# Patient Record
Sex: Male | Born: 1945 | Race: White | Hispanic: No | Smoking: Never smoker
Health system: Southern US, Community
[De-identification: ages and names within clinical notes are randomized; demographics above are authoritative.]

## PROBLEM LIST (undated history)

## (undated) DIAGNOSIS — G459 Transient cerebral ischemic attack, unspecified: Secondary | ICD-10-CM

## (undated) DIAGNOSIS — I639 Cerebral infarction, unspecified: Secondary | ICD-10-CM

## (undated) DIAGNOSIS — Z9289 Personal history of other medical treatment: Secondary | ICD-10-CM

## (undated) DIAGNOSIS — E1142 Type 2 diabetes mellitus with diabetic polyneuropathy: Secondary | ICD-10-CM

## (undated) DIAGNOSIS — C499 Malignant neoplasm of connective and soft tissue, unspecified: Secondary | ICD-10-CM

## (undated) DIAGNOSIS — E119 Type 2 diabetes mellitus without complications: Secondary | ICD-10-CM

## (undated) DIAGNOSIS — F1010 Alcohol abuse, uncomplicated: Secondary | ICD-10-CM

## (undated) DIAGNOSIS — F0390 Unspecified dementia without behavioral disturbance: Secondary | ICD-10-CM

## (undated) DIAGNOSIS — I10 Essential (primary) hypertension: Secondary | ICD-10-CM

## (undated) DIAGNOSIS — E785 Hyperlipidemia, unspecified: Secondary | ICD-10-CM

## (undated) HISTORY — DX: Hyperlipidemia, unspecified: E78.5

## (undated) HISTORY — DX: Essential (primary) hypertension: I10

## (undated) HISTORY — PX: OTHER SURGICAL HISTORY: SHX169

## (undated) HISTORY — DX: Cerebral infarction, unspecified: I63.9

---

## 1997-07-06 ENCOUNTER — Emergency Department (HOSPITAL_COMMUNITY): Admission: EM | Admit: 1997-07-06 | Discharge: 1997-07-06 | Payer: Self-pay | Admitting: Emergency Medicine

## 2002-06-02 ENCOUNTER — Ambulatory Visit: Admission: EM | Admit: 2002-06-02 | Discharge: 2002-06-02 | Payer: Self-pay | Admitting: Emergency Medicine

## 2002-06-02 ENCOUNTER — Encounter: Payer: Self-pay | Admitting: Emergency Medicine

## 2002-06-02 ENCOUNTER — Encounter: Payer: Self-pay | Admitting: *Deleted

## 2002-06-05 ENCOUNTER — Emergency Department (HOSPITAL_COMMUNITY): Admission: EM | Admit: 2002-06-05 | Discharge: 2002-06-06 | Payer: Self-pay | Admitting: Emergency Medicine

## 2002-06-05 ENCOUNTER — Encounter: Payer: Self-pay | Admitting: *Deleted

## 2005-11-02 ENCOUNTER — Emergency Department (HOSPITAL_COMMUNITY): Admission: EM | Admit: 2005-11-02 | Discharge: 2005-11-02 | Payer: Self-pay | Admitting: Emergency Medicine

## 2005-11-05 ENCOUNTER — Encounter (HOSPITAL_BASED_OUTPATIENT_CLINIC_OR_DEPARTMENT_OTHER): Admission: RE | Admit: 2005-11-05 | Discharge: 2005-12-24 | Payer: Self-pay | Admitting: Surgery

## 2005-11-19 ENCOUNTER — Ambulatory Visit (HOSPITAL_COMMUNITY): Admission: RE | Admit: 2005-11-19 | Discharge: 2005-11-19 | Payer: Self-pay | Admitting: Surgery

## 2005-12-14 ENCOUNTER — Ambulatory Visit: Admission: RE | Admit: 2005-12-14 | Discharge: 2005-12-14 | Payer: Self-pay | Admitting: Surgery

## 2010-08-07 ENCOUNTER — Encounter: Payer: Self-pay | Admitting: Internal Medicine

## 2010-08-07 ENCOUNTER — Emergency Department (HOSPITAL_COMMUNITY): Payer: Medicare Other

## 2010-08-07 ENCOUNTER — Encounter (HOSPITAL_COMMUNITY): Payer: Self-pay

## 2010-08-07 ENCOUNTER — Inpatient Hospital Stay (HOSPITAL_COMMUNITY)
Admission: EM | Admit: 2010-08-07 | Discharge: 2010-08-08 | DRG: 073 | Disposition: A | Discharge status: Home / Self Care | Payer: Medicare Other | Attending: Internal Medicine | Admitting: Internal Medicine

## 2010-08-07 DIAGNOSIS — Z7982 Long term (current) use of aspirin: Secondary | ICD-10-CM

## 2010-08-07 DIAGNOSIS — M5412 Radiculopathy, cervical region: Secondary | ICD-10-CM | POA: Diagnosis present

## 2010-08-07 DIAGNOSIS — E1149 Type 2 diabetes mellitus with other diabetic neurological complication: Principal | ICD-10-CM | POA: Diagnosis present

## 2010-08-07 DIAGNOSIS — I6350 Cerebral infarction due to unspecified occlusion or stenosis of unspecified cerebral artery: Secondary | ICD-10-CM | POA: Diagnosis present

## 2010-08-07 DIAGNOSIS — E785 Hyperlipidemia, unspecified: Secondary | ICD-10-CM | POA: Diagnosis present

## 2010-08-07 DIAGNOSIS — I10 Essential (primary) hypertension: Secondary | ICD-10-CM | POA: Diagnosis present

## 2010-08-07 DIAGNOSIS — E1142 Type 2 diabetes mellitus with diabetic polyneuropathy: Secondary | ICD-10-CM | POA: Diagnosis present

## 2010-08-07 HISTORY — DX: Malignant neoplasm of connective and soft tissue, unspecified: C49.9

## 2010-08-07 LAB — CBC
MCV: 85.8 fL (ref 78.0–100.0)
Platelets: 256 10*3/uL (ref 150–400)
WBC: 5.2 10*3/uL (ref 4.0–10.5)

## 2010-08-07 LAB — APTT: aPTT: 25 seconds (ref 24–37)

## 2010-08-07 LAB — DIFFERENTIAL
Band Neutrophils: 0 % (ref 0–10)
Basophils Absolute: 0 10*3/uL (ref 0.0–0.1)
Basophils Relative: 0 % (ref 0–1)
Blasts: 0 %
Eosinophils Relative: 4 % (ref 0–5)
Lymphocytes Relative: 36 % (ref 12–46)
Lymphs Abs: 1.9 10*3/uL (ref 0.7–4.0)
Monocytes Absolute: 0.4 10*3/uL (ref 0.1–1.0)
Monocytes Relative: 8 % (ref 3–12)

## 2010-08-07 LAB — CARDIAC PANEL(CRET KIN+CKTOT+MB+TROPI): Total CK: 62 U/L (ref 7–232)

## 2010-08-07 LAB — URINALYSIS, ROUTINE W REFLEX MICROSCOPIC
Bilirubin Urine: NEGATIVE
Hgb urine dipstick: NEGATIVE
Protein, ur: NEGATIVE mg/dL
Urobilinogen, UA: 0.2 mg/dL (ref 0.0–1.0)

## 2010-08-07 LAB — COMPREHENSIVE METABOLIC PANEL
ALT: 10 U/L (ref 0–53)
AST: 10 U/L (ref 0–37)
Alkaline Phosphatase: 50 U/L (ref 39–117)
CO2: 31 mEq/L (ref 19–32)
Chloride: 103 mEq/L (ref 96–112)
Creatinine, Ser: 0.85 mg/dL (ref 0.50–1.35)
GFR calc non Af Amer: >60 mL/min (ref >60)
Total Bilirubin: 0.4 mg/dL (ref 0.3–1.2)

## 2010-08-07 LAB — GLUCOSE, CAPILLARY
Glucose-Capillary: 289 mg/dL — ABNORMAL HIGH (ref 70–99)
Glucose-Capillary: 188 mg/dL — ABNORMAL HIGH (ref 70–99)

## 2010-08-07 LAB — HEMOGLOBIN A1C
Hgb A1c MFr Bld: 9.6 % — ABNORMAL HIGH (ref <5.7)
Mean Plasma Glucose: 229 mg/dL — ABNORMAL HIGH (ref <117)

## 2010-08-07 LAB — TROPONIN I: Troponin I: <0.3 ng/mL (ref <0.30)

## 2010-08-07 LAB — BASIC METABOLIC PANEL
CO2: 31 mEq/L (ref 19–32)
Glucose, Bld: 302 mg/dL — ABNORMAL HIGH (ref 70–99)
Potassium: 5 mEq/L (ref 3.5–5.1)
Sodium: 139 mEq/L (ref 135–145)

## 2010-08-07 NOTE — H&P (Signed)
Hospital Admission Note Date: 08/07/2010  Patient name: Hayden Mckinney Medical record number: [id number] Date of birth: Feb 16, 1945 Age: 65 y.o. Gender: male PCP: No primary provider on file.  Medical Service: [facility] Teaching [facility]  Attending physician: Dr. Eppie Gibson   Pager:  Resident (R2/R3): Dr. Vanessa Kick   Pager: [phone] Resident (R1): Dr. Victorio Palm    Pager: [phone]  Chief Complaint: Progressing numbness on left face/arm/leg  History of Present Illness: [name] is a 65 yo male that presents with increasing numbness of his left cheek, mouth, left arm and hand including 3 medial fingers as well as left leg for 1 month.  He notes that the progression to his leg was over the last 1-2 weeks, during which time his awareness of the numbness increased.  He denies an inciting incident 1 month ago such as trauma or an excruciating headache.  He also reports left eye swelling with pressure for the last month.  At present, there is no edema, but he reports constant pressure in his left eye.  He denies loss of vision but endorses b/l blurry vision that only occurs in the morning.  He endorses intermittent (every 2-3d) left frontal headaches described as sharp/stabbing, 7/10 at worst,  that improve with ASA.  He notes that his gait has deteriorated in that he now staggers and feels unsteady with walking. He feels flat footed and notes a loss of spring in his step.  He reports that on the evening prior to admission, he almost fell out of the shower and felt dizzy on the morning of admission.  He endorses feeling disoriented when he takes a shower, which is at no particular time of day, and he describes the water temperature as warm, not cold.  Finally, he endorses some left sided drooling over the last month.  Of note, patient has not been on any medications for years.  He reports only taking ASA when needed for a headache.  Meds: No current outpatient prescriptions on file.    Allergies: Review of  patient's allergies indicates no known allergies.  Past Medical History  Diagnosis Date  . Diabetes mellitus, history of treatment with metformin    . Cancer sarcoma of rt leg, excised  . HTN, no medication    Past Surgical History: Excision of right leg sarcoma  Family History: Dad, died in 67s, lung pathology? Mom, died @ 20yo, Brain Cancer Brother : DM, Sister: TB  History   Social History  . Marital Status: Widowed    Spouse Name: N/A    Number of Children: 0  . Years of Education: GED   Occupational History  . 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 Smoking status: Never smoked  . Smokeless tobacco: Not on file  . Alcohol Use: 6-7 beers/week  . Drug Use: No illicit drug use  . Sexually Active: Not on file   Other Topics Concern  . Lives with sister in [place]/some sort of boarding house   Social History Narrative  . No narrative on file    Review of Systems: Pertinent items are noted in HPI.  Physical Exam: Vitals: T:98.6   HR: 85   BP: 132/78   RR: 132/78  O2 saturation: 97% RA   General: resting in bed HEENT: PERRL, EOMI, no scleral icterus Cardiac: RRR, no rubs, murmurs or gallops Pulm: clear to auscultation bilaterally, moving normal volumes of air Abd: soft, nontender, nondistended,  BS present Ext: warm and well perfused, no pedal edema Neuro: alert and oriented X3, cranial nerves II-XII grossly intact, strength equal in bilateral upper and lower extremities, sensation to dull touch diminished on the left   Lab results: CBC    Component Value Date/Time   WBC 5.2 08/07/2010 0910   RBC 4.85 08/07/2010 0910   HGB 14.9 08/07/2010 0910   HCT 41.6 08/07/2010 0910   PLT 256 08/07/2010 0910   MCV 85.8 08/07/2010 0910   MCH 30.7 08/07/2010 0910   MCHC 35.8 08/07/2010 0910   RDW NOT CALCULATED 08/07/2010 0910   NEUTROABS 2.7 08/07/2010 0910   LYMPHSABS 1.9 08/07/2010  0910   MONOABS 0.4 08/07/2010 0910   EOSABS 0.2 08/07/2010 0910   BASOSABS 0.0 08/07/2010 0910   BMET    Component Value Date/Time   NA 140 08/07/2010 1509   K 4.0 08/07/2010 1509   CL 103 08/07/2010 1509   CO2 31 08/07/2010 1509   GLUCOSE 164* 08/07/2010 1509   BUN 15 08/07/2010 1509   CREATININE 0.85 08/07/2010 1509   CALCIUM 9.8 08/07/2010 1509   GFRNONAA >60 08/07/2010 1509   GFRAA >60 08/07/2010 1509  Bilirubin, Total                         0.4               0.3-1.2          mg/dL  Alkaline Phosphatase                     50                39-117           U/L  SGOT (AST)                               10                0-37             U/L  SGPT (ALT)                               10                0-53             U/L  Total  Protein                           6.6               6.0-8.3          g/dL  Albumin-Blood                            3.7               3.5-5.2          g/dL  Calcium                                  9.8               8.4-10.5  Mg/dL  Protime ( Prothrombin Time)              13.2              11.6-15.2        seconds  INR                                      0.98              0.00-1.49 PTT(a-Partial Thromboplastn Time)        25                24-37            seconds  Troponin I                               <0.30             <0.30            Ng/mL Creatine Kinase, Total                   67                7-232            U/L  CK, MB                                   3.5               0.3-4.0          ng/mL  Relative Index                           SEE NOTE.         0.0-2.5    RELATIVE INDEX IS INVALID    WHEN CK < 100 U/L  Urine Micro Squamous Epithelial / LPF                RARE              RARE  WBC / HPF                                0-2               <3               WBC/hpf  RBC / HPF                                0-2               <3               RBC/hpf  Bacteria / HPF                           RARE              RARE  UA  Color, Urine  YELLOW            YELLOW  Appearance                               CLEAR             CLEAR  Specific Gravity                         1.026             1.005-1.030  pH                                       5.0               5.0-8.0  Urine Glucose                            >1000      a      NEG              mg/dL  Bilirubin                                NEGATIVE          NEG  Ketones                                  NEGATIVE          NEG              mg/dL  Blood                                    NEGATIVE          NEG  Protein                                  NEGATIVE          NEG              mg/dL  Urobilinogen                             0.2               0.0-1.0          mg/dL  Nitrite                                  NEGATIVE          NEG  Leukocytes                               NEGATIVE          NEG  Imaging results:  Ct Head Wo Contrast  08/07/2010  *RADIOLOGY REPORT*  Clinical Data: Left-sided numbness, slurred speech, unsteady gait, and drooling from now for 1 month.  History of diabetes and right leg sarcoma.  CT HEAD WITHOUT CONTRAST  Technique:  Contiguous axial images were obtained from the base of the skull through the vertex without contrast.  Comparison: None.  Findings: There is mild diffuse cerebral atrophy.  Low attenuation changes in the deep white matter consistent with small vessel ischemic change.  Old lacunar infarct in the left thalamus.  No definite mass effect or midline shift.  There is a suggestion of possible subtle effacement of the sulci in the right anterior frontal region along the convexity.  This might just be due to differences in patient positioning but very early acute infarct changes are not excluded.  No abnormal extra-axial fluid collections.  Gray-white matter junctions are distinct.  Basal cisterns are not effaced.  Ventricles are not dilated.  No evidence of acute intracranial hemorrhage.  No depressed skull fractures. Visualized  paranasal sinuses are are not opacified.  IMPRESSION: Suggestion of mild focal effacement of the right anterior frontal sulci of the convexity could represent very early changes of acute infarct.  No evidence of acute intracranial hemorrhage or mass lesion.  Mild diffuse atrophy and small vessel ischemic change.  Original Report Authenticated By: Neale Burly, M.D.   [name] Contrast  08/07/2010  *RADIOLOGY REPORT*  Clinical Data: Weakness.  Left-sided numbness, the face and hand. Slurred speech.  Unsteady gait.  Abnormal CT scan.  MRI BRAIN WITHOUT CONTRAST  Technique:  Multiplanar, multiecho pulse sequences of the brain and surrounding structures were obtained according to standard protocol without intravenous contrast.  Comparison: CT of the head 08/07/2010.  Findings: A punctate area of restricted diffusion is present within the posterior right temporal lobe without associated hemorrhage. The area in question over the high right frontal lobe is unremarkable for acute disease.  No significant extra-axial fluid collection is present.  No other acute or subacute infarction is present.  Mild atrophy and moderate white matter T2 and FLAIR hyperintensity is present bilaterally.  Flow is present in the major intracranial arteries.  The globes and orbits are intact. Mild mucosal thickening is present in the ethmoid air cells.  There is minimal fluid in the mastoid air cells bilaterally.  No obstructing nasopharyngeal lesion is evident.  IMPRESSION:  1.  Punctate non hemorrhagic infarct involve the posterior temporal lobe. 2.  Mild age advanced atrophy white matter disease.  This is nonspecific, but likely reflects sequelae of chronic microvascular ischemia. 3.  No focal pathology over the right frontal convexity. The finding was likely artifactual on the CT scan.  These results were called by telephone on 08/07/2010  at  12:30 p.m. to  Dr. Marnette Burgess, who verbally acknowledged these results.  Original Report  Authenticated By: Resa Miner. MATTERN, M.D.    Assessment & Plan by Problem:  1. Neurological changes (increased numbness of left sided body): Given CT/MRI findings of right sided punctate posterior temporal lobe ischemic changes and persistent neurological deficits, patient is suffering from a stroke.  Regarding risk factors, patient is hypertensive and has history of DM, not on medication, but has no history of tobacco abuse and reports exercising by walking a few miles a day.  Given that he has no recent history of outpatient follow up, we are not certain of his cholesterol status.  Plan: -f/u A1c, cardiac enzymes, fasting lipid profile -f/u 2D echo, carotid dopplers, am EKG -f/u PT/speech therapy/OT -patient given ASA in ED, continue treatment -treat with Rosuvastatin  2. Hypertension: Patient's blood pressure was elevated at  admission.  He is not on any outpatient medications. Plan:We will monitor BP and then consider an antihypertensive.  3. DM: Patient reports history of DM.  He has no current outpatient follow up. Plan: We will follow up with HbA1c, and then consider further treatment  4. VTE Prophylaxis: Enoxaparin 40mg  SQ daily  R2/3______________________________  (Dr. Vertell Limber, [phone])    R1________________________________  (Dr. Pryor Ochoa, [phone])  ATTENDING: I performed and/or observed a history and physical examination of the patient.  I discussed the case with the residents as noted and reviewed the residents' notes.  I agree with the findings and plan--please refer to the attending physician note for more details.  Signature________________________________  Printed Name_____________________________

## 2010-08-08 DIAGNOSIS — J441 Chronic obstructive pulmonary disease with (acute) exacerbation: Secondary | ICD-10-CM

## 2010-08-08 LAB — GLUCOSE, CAPILLARY
Glucose-Capillary: 168 mg/dL — ABNORMAL HIGH (ref 70–99)
Glucose-Capillary: 158 mg/dL — ABNORMAL HIGH (ref 70–99)
Glucose-Capillary: 164 mg/dL — ABNORMAL HIGH (ref 70–99)

## 2010-08-08 LAB — LIPID PANEL: LDL Cholesterol: 141 mg/dL — ABNORMAL HIGH (ref 0–99)

## 2010-08-08 NOTE — Consult Note (Signed)
NAMESHISHIR, LUTZOW NO.:  [id number]  MEDICAL RECORD NO.:  [id number]  LOCATION:  3033                         FACILITY:  [facility]  PHYSICIAN:  Aldean Ast, MD       DATE OF BIRTH:  1945-02-24  DATE OF CONSULTATION:  08/07/2010 DATE OF DISCHARGE:                                CONSULTATION   REFERRING PHYSICIAN:  ER Team.  REASON FOR CONSULTATION:  Left facial altered sensation.  HISTORY OF PRESENT ILLNESS:  The patient is a 65 year old man who has been observing altered sensations in the left face for almost a month now.  As per the patient at times, he has noted that the left side of the face is little droop as compared to the right side, but then it gets normalized.  He is noted that over a span of a month now, specially when he goes for long walks in the warm weather, he thinks that his symptoms are getting worst, which do come back to normal once he is well rested.  There is no other neurological deficit noted.  An MRI of the brain was performed in the emergency department that showed tiny right posterior temporal lobe ischemic infarct.  PAST MEDICAL HISTORY: 1. Diabetes mellitus, which is diet and exercise controlled.  He walks     almost couple miles a day. 2. 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 3. Hypertension.  SOCIAL HISTORY:  He is retired from a company, used to build a Social worker.  He does not smoke cigarettes, occasionally drinks alcohol, but denies any illicit drug abuse.  MEDICATIONS:  Does not use any medication regularly, cannot take aspirin off and on for pains or headaches.  FAMILY HISTORY:  Mother had strokes.  He is not aware of his father side of the family.  REVIEW OF SYSTEMS:  Denies any chest pain.  Denies any shortness of breath.  Denies any changes in his vision.  Denies any problem with nausea, vomiting, diarrhea.  Denies any recent fevers.  Denies any recent rashes.  Denies any recent weight  loss.  Denies any sputum production.  Denies any cough.  Denies any joint problems.  He does have recently noted some altered sensation to the left side of the face and off and on balance problem and he walks in the heat, but other than that denies any acute distress.  Rest of the 10-organ review of system unremarkable.  REVIEW OF CLINICAL DATA:  I have seen his MRI of the brain and I have noted a tiny right posterior temporal lobe infarct.  No vascular study is available so far.  I have reviewed his labs and noted normal CBC and BMP accept very high glucose values.  UA is unremarkable as well.  PHYSICAL EXAMINATION:  GENERAL:  Comfortably lying down the bed. VITAL SIGNS:  Blood pressure 161/88 mmHg and pulse of 74 per minute. NEUROLOGIC:  Awake, oriented x3 in no acute distress.  Bilateral pupils reactive to light and accommodation.  Moves eyes to all direction. There is no field cut noted on limited bedside evaluation.  Face is symmetrical for strength testing, however, he does state to have altered sensation  feeling on the face and sort of patchy distribution.  Tongue is midline without atrophy or fasciculation.  Palate elevates symmetrically bilaterally.  Motor examination reveals strength 5/5 all over without any drift.  Sensory examination reveals that he has mild alteration of sensation mainly to the face, but otherwise has intact light touch sensation all over.  Gait was deferred.  IMPRESSION:  A 65 year old man with altered left-sided face sensation for almost a month now, has a tiny ischemic infarct in the right temporal lobe.  Given the fact that his symptoms do get worse at times with the exertion, possibly because of dehydration given the long walks in these hot weather;  I will suggest ruling out critical stenosis impairing the central nervous system perfusion.  Therefore, I would suggest getting this patient's CT angiogram of the head and neck for better  vascular evaluation.  PLAN: 1. Please get the CT angio of the head and neck as explained above.     Please keep him on daily 325 mg of aspirin. 2. Please send his HbA1c, fasting lipid panel in the morning and get     his cardiac echo. 3. Stroke Team will follow up with the patient from in the morning and     we will proceed as the case progresses.      ______________________________ Aldean Ast, MD     WS/MEDQ  D:  08/07/2010  T:  08/08/2010  Job:  [id number]  Electronically Signed by Aldean Ast MD on 08/08/2010 07:56:42 AM

## 2010-08-13 NOTE — Discharge Summary (Signed)
Hayden Mckinney, HENINGER NO.:  [id number]  MEDICAL RECORD NO.:  [id number]  LOCATION:  3033                         FACILITY:  [facility]  PHYSICIAN:  Oval Linsey, MD     DATE OF BIRTH:  [birth year]  DATE OF ADMISSION:  08/07/2010 DATE OF DISCHARGE:  08/08/2010                              DISCHARGE SUMMARY   DISCHARGE DIAGNOSES: 1. Diabetic peripheral neuropathy. 2. Type 2 diabetes mellitus. 3. Left cervical radiculopathy. 4. Stroke risk modification. 5. Hyperlipidemia. 6. Hypertension.  DISCHARGE MEDICATIONS: 1. Aspirin 325 mg tablets by mouth daily. 2. Metformin 500 mg by mouth daily with a meal take 1 pill by mouth     once daily for 1 week and then increased to 1 pill by mouth twice     daily. 3. Pravastatin 40 mg by mouth daily at bedtime. 4. Lisinopril 10 mg p.o. daily.  DISPOSITION AND FOLLOWUP:  Hayden Mckinney is medically stable for safe discharge to home.  He is scheduled to follow up with Dr. Leonia Reeves on August 25, 2010, at 3:45 p.m. in the [facility] to assess his adjustment to new medications.  At this appointment he should also have a BMET to assess renal function and potassium as we are starting him on an ACE inhibitor as well as B12 levels drawn to evaluate if his tingling sensation is linked to vitamin deficiency.  PROCEDURES DURING HOSPITALIZATION: 1. CT head done on August 07, 2010, suggestion of mild focal effacement     of the right anterior frontal or foci of the convexity could     represent very early changes of acute infarct, no evidence of acute     intracranial hemorrhage or mass lesion, mild diffuse atrophy and     small vessel ischemic change. 2. MR brain August 07, 2010, revealed punctate nonhemorrhagic infarct     involved in the posterior temporal lobe.  Mild age advanced atrophy     white matter disease.  This was nonspecific, but likely reflects     sequelae of chronic microvascular ischemia.  No focal  pathology     over the right frontal convexity, the finding was likely     artifactual on CT scan. 3. A 2-D echo on August 07, 2010, the left ventricle cavity size was     normal.  Mild focal basal hypertrophy of the septum.  Ejection     fraction 60-65%.  Wall motion was normal with no regional wall     motion abnormalities. 4. Carotid Dopplers done on August 07, 2010,  normal mean flow     velocities in the identified vessels of the anterior and posterior     circulations, absent biorbital windows, limited evaluation of     ophthalmics and carotid siphon.  No ICA stenosis, mild plaque.  CONSULTATIONS DURING HOSPITALIZATION:  Neurology.  ADMITTING H AND P:  Hayden Mckinney is a 65 year old man that presents with increasing numbness of his left cheek, mouth, left arm and hand including three medial fingers as well as left leg for 1 month.  He notes that the progression to his leg was over the last 1-2 weeks during which time his awareness of the  numbness increased.  He denies an inciting incident 1 month ago suggesting trauma or excruciating headache. He also reports left eye swelling with pressure for the last month, at present there is no edema, but he reports a constant pressure in his left eye.  He denies loss of vision, but endorses bilateral blurry vision that only occurs in the morning.  He endorses intermittent every 2-3 days left frontal headaches, described as sharp and stabbing, 7/10 at worse, that improved with aspirin.  He notes that his gait has deteriorated and that now he staggers and feels unsteady with walking. He feels flat footed and noted a loss of spring in his step.  He reports that on the evening prior to admission he almost fell out of the shower and felt dizzy.  On the morning of admission he endorses feeling disoriented when he took a shower and describes the water temperature  as warm not cold.  Finally he endorses some left-sided drooling over  the last month.  Of  note, he has not been on any medication for years.   He reports only taking aspirin when needed for headache.  PHYSICAL EXAMINATION: VITALS:  Temperature 98.6, heart rate 85, blood pressure 132/78,  respiratory rate 20, O2 saturation 97%. GENERAL:  Resting in bed. CARDIAC:  Regular rate and rhythm.  No murmurs, rubs or gallops. PULMONARY:  Clear to auscultation bilaterally.  Moving normal volumes of air. ABDOMEN:  Soft, nontender, nondistended.  Bowel sounds present. NEURO:  Alert and oriented x 3.  Cranial nerves II to XII grossly intact. Strength equal in bilateral upper and lower extremities.  Sensation to light touch diminished on left thumb, pointer, and middle fingers.  LABORATORY DATA:  White blood cell count 5.2, hemoglobin 14.9, hematocrit  41.6 platelets 256, MCV 85.8.  Sodium 140, potassium 4, chloride 103, CO2  31, glucose 164, BUN 15, creatinine 0.85, calcium 9.8.  Liver enzymes were  within normal limits.  Cardiac enzymes were negative x 3.  HOSPITAL COURSE: 1. Diabetic peripheral neuropathy.  Hayden Mckinney presented with     increasing numbness and tingling for 1 month, initially described  in the left leg, but upon probing he reports his sensation has been      diminished in both lower extremities in a stocking distribution up to     the ankles bilaterally. Though acute infarct was suggested by     imaging the location did not correlate with clinical symptoms, thus     after careful clinical examination we concluded that his new onset     numbness and tingling was likely secondary to a diabetic neuropathy.     When he follows up in clinic, a B12 level should be drawn to ensure      that his sensory deficits are not secondary to a vitamin B12 deficiency.  2. Type 2 diabetes mellitus.  [name] had known history of diabetes     mellitus, but was not on any treatment.  He was started on     metformin a few years ago, but did not tolerate 1000 mg b.i.d.  We     stressed  the importance of glycemic control and informed him that a     gradual increase of metformin should be better tolerated.  He was     agreeable to this plan.  He may also benefit from meeting with     Barnabas Harries for diabetic education.  3. Left cervical radiculopathy, the distribution of sensory deficits  of his left upper extremity suggest a C7-8 radiculopathy.  If the      problem persists and is bothersome, imaging may be considered.  A B12     level should be checked as stated above.  4. Stroke risk modification.  Mr. Toupin imaging suggest previous      infarcts and he has several risk factors which predispose him to      a stroke.  Dopplers and echo did not reveal an embolic source.       His lipid profile reveals hyperlipidemia and he has history of      untreated hypertension and diabetes mellitus.  He was started on      lisinopril, metformin, pravastatin and aspirin.  He was encouraged      to exercise to reduce central obesity.  He seems agreeable to the      plan and will follow up in the [facility].  5. Hyperlipidemia.  Mr. Deeney lipid panel revealed hyperlipidemia.     He was not on prior therapy.  We restarted him on pravastatin.  His     liver enzymes are within normal limits.  6. Hypertension.  Mr. Covington was not on therapy for his hypertension     prior to hospitalization.  We explained to him the importance of blood     pressure control and started him on lisinopril.  DISCHARGE VITALS:  Temperature 97.3, blood pressure 125/76, pulse 82,  O2 saturation 96% on room air, respiratory rate 20.  DISCHARGE LABSS:  Cholesterol 262, triglycerides 380, HDL 45, LDL 141,  VLDL 76, hemoglobin A1c 9.6.   ______________________________ Pryor Ochoa, MD   ______________________________ Oval Linsey, MD   NK/MEDQ  D:  08/10/2010  T:  08/10/2010  Job:  [id number]  cc:   Doree Albee, M.D. Pedro Earls, MD  Electronically Signed by  Pryor Ochoa MD on 08/11/2010 12:20:46 PM Electronically Signed by Oval Linsey  on 08/13/2010 02:59:45 PM

## 2010-08-16 ENCOUNTER — Other Ambulatory Visit: Payer: Self-pay | Admitting: *Deleted

## 2010-08-16 MED ORDER — ACCU-CHEK NANO SMARTVIEW W/DEVICE KIT: 1.0000 | PACK | Freq: Every day | Status: DC

## 2010-08-16 MED ORDER — GLUCOSE BLOOD VI STRP: 1.0000 | ORAL_STRIP | Freq: Every day | Status: DC

## 2010-08-16 MED ORDER — ACCU-CHEK FASTCLIX LANCETS MISC: 1.0000 | Freq: Every day | Status: DC

## 2010-08-17 ENCOUNTER — Other Ambulatory Visit: Payer: Self-pay | Admitting: *Deleted

## 2010-08-17 MED ORDER — ACCU-CHEK FASTCLIX LANCETS MISC: 1.0000 | Freq: Every day | Status: DC

## 2010-08-17 MED ORDER — ACCU-CHEK NANO SMARTVIEW W/DEVICE KIT: 1.0000 | PACK | Freq: Every day | Status: DC

## 2010-08-17 MED ORDER — GLUCOSE BLOOD VI STRP: 1.0000 | ORAL_STRIP | Freq: Every day | Status: DC

## 2010-08-17 NOTE — Telephone Encounter (Signed)
Talked to Dr. Stann Mainland about this pt; needs to check bld sugars once daily. For insurance purpose, directions and dx code required on each rx.

## 2010-08-18 ENCOUNTER — Telehealth: Payer: Self-pay | Admitting: Dietician

## 2010-08-21 NOTE — Telephone Encounter (Signed)
Unable to leave message again today. Please  Schedule CDE appointment as appropriate when patient comes to office for hospital follow up

## 2010-08-25 ENCOUNTER — Ambulatory Visit (INDEPENDENT_AMBULATORY_CARE_PROVIDER_SITE_OTHER): Payer: Medicare Other | Admitting: Internal Medicine

## 2010-08-25 ENCOUNTER — Encounter: Payer: Medicare Other | Admitting: Internal Medicine

## 2010-08-25 ENCOUNTER — Encounter: Payer: Self-pay | Admitting: Internal Medicine

## 2010-08-25 DIAGNOSIS — E785 Hyperlipidemia, unspecified: Secondary | ICD-10-CM

## 2010-08-25 DIAGNOSIS — E1149 Type 2 diabetes mellitus with other diabetic neurological complication: Secondary | ICD-10-CM | POA: Insufficient documentation

## 2010-08-25 DIAGNOSIS — E119 Type 2 diabetes mellitus without complications: Secondary | ICD-10-CM

## 2010-08-25 DIAGNOSIS — I10 Essential (primary) hypertension: Secondary | ICD-10-CM

## 2010-08-25 MED ORDER — PRAVASTATIN SODIUM 40 MG PO TABS: 40.0000 mg | ORAL_TABLET | Freq: Every day | ORAL | Status: DC

## 2010-08-25 MED ORDER — LISINOPRIL 10 MG PO TABS: 10.0000 mg | ORAL_TABLET | Freq: Every day | ORAL | Status: DC

## 2010-08-25 MED ORDER — METFORMIN HCL 500 MG PO TABS: 500.0000 mg | ORAL_TABLET | Freq: Two times a day (BID) | ORAL | Status: DC

## 2010-08-25 NOTE — Assessment & Plan Note (Signed)
The patient recently had fasting lipid panel in the hospital. He is now on pravastatin 40 mg daily. He has had no adverse side effects from this medication. I did refill today. No change to regimen at this time.

## 2010-08-25 NOTE — Patient Instructions (Addendum)
You were seen today for a hospital follow visit. You also got to meet your new doctor, Dr. Doug Sou. This hospitalization was related to your diabetes. We would like you to continue taking your medications as ordered. We will take you to talk to Barnabas Harries, our diabetic specialist. It is very important to control your diabetes for your whole body's health. We would also like you to continue working on your diet and exercise. All these things will help to reduce your risk of stroke or heart attack. If you have any problems or would like to be seen sooner please feel free to call our office. Our number is [phone].    Diabetes and Exercise Regular exercise is important and can help:   Control blood glucose (sugar).   Decrease blood pressure.   Control blood lipids (cholesterol and triglycerides).   Improve overall health.  BENEFITS FROM EXERCISE:  Improved fitness.   Improved flexibility.   Improved endurance.   Increased bone density.   Weight control.   Increased muscle strength.   Decreased body fat.   Improvement of the body's use of a hormone called insulin.   Increased insulin sensitivity.   Reduction of insulin needs.   Helps you feel better.   Reduces stress and tension.  People with diabetes who add exercise to their lifestyle gain additional benefits.   Weight loss.   Reduces appetite.   Improves body's use of blood glucose (sugar).   Decreases risk factors for heart disease:   Lowering of cholesterol and triglycerides.   Raising the level of good cholesterol (high-density lipoproteins [HDL]).   Lowering blood sugar.   Decreases blood pressure.  TYPE 1 DIABETES AND EXERCISE  Exercise will usually lower your blood glucose.   If blood glucose is greater than 240 mg/dl, check urine ketones. If ketones are present, do not exercise.   Location of the insulin injection sites may need to be adjusted with exercise. Avoid injecting insulin into areas of  the body that will be exercised. For example, avoid injecting insulin into:   The arms when playing tennis.   The legs when jogging. For more information, discuss this with your caregiver.   Keep a record of:   Food intake.   Type and amount of exercise.   Expected peak times of insulin action.   Blood glucose (sugar) levels.  Do this before, during and after exercise. Review your records with your caregiver(s). This will help you to develop guidelines for adjusting food intake and/or insulin amounts.  TYPE 2 DIABETES AND EXERCISE  Regular physical activity can help control blood glucose.   Exercise is important because it may:   Increase the body's sensitivity to insulin.   Improve blood glucose control.   Exercise reduces the risk of heart disease. It decreases serum cholesterol and triglycerides. It also lowers blood pressure.   Those who take insulin or oral hypoglycemic agents should watch for signs of hypoglycemia. These signs include dizziness, shaking, sweating, chills and confusion.   Body water is lost during exercise. It must be replaced. This will help to avoid loss of body fluids (dehydration) and/or heat stroke.  Be sure to talk to your caregiver before starting an exercise program to make sure it is safe for you. Remember, any activity is better than none.  Document Released: 03/17/2003 Document Re-Released: 10/22/2008 [facility] Patient Information 2011 [place].   Diabetes & Cerebrovascular Disease (Strokes)   Cerebrovascular disease is when there are problems with the blood vessels  of the brain, which can often result in a stroke. There are two types of strokes:   Ischemic stroke. This type occurs when a blood vessel supplying blood to the brain is blocked (obstructed).  Hemorrhagic stroke. This type occurs when a blood vessel breaks open (ruptures).  The majority of strokes are ischemic strokes. They are usually caused when fatty deposits lining the  wall of the blood vessel (atherosclerosis) cause an obstruction and then blood can not flow properly.   Hemorrhagic stokes account for a smaller amount of all strokes. This occurs when the blood vessel becomes weak and ruptures. This sometimes is caused by the rupture of a bulging or weak spot on a blood vessel (aneurysm) or blood vessels that are not normally formed (arteriovenous malformations). Both of these, when they happen, cause bleeding into the brain.   STROKE WARNING: Often people will have warning signs before they actually have a stroke.  A trans ischemic attack (TIA) is a warning sign that there is a decrease in oxygen and blood supply to an area of your brain. TIA symptoms may last for a few minutes and usually resolve within an hour. Symptoms can persist for up to twenty-four hours. If this does not get better or has not gone away within that time period, it is then, by definition, a stroke. Usually, there is no permanent damage with a TIA.    CAUSES Diabetes is a risk factor for stroke because of an increase in plaque or fatty deposits (atherosclerosis) of the blood vessels. Because of these plaques and fatty deposits in the blood vessels, there is less room for blood and oxygen to flow to the brain, which can cause a stroke.   SYMPTOMS These symptoms usually develop suddenly (or may be newly present upon awakening from sleep):  Loss of vision.   Double vision.  Confusion.   Numbness or weakness on one side of the face or body.  Inability to speak (aphasia).    RISK FACTORS  Diabetes.  High blood pressure (hypertension).  High cholesterol.  An abnormal heart rhythm (atrial fibrillation).  Age over 61.   Family history of stroke or heart attack.  Personal history of blood clots.   Smoking.  Use of illegal drugs, especially cocaine and methamphetamine.  Oral contraceptives, especially in combination with smoking.    EARLY STROKE TREATMENT  Treatment of  ischemic stroke may use "clot buster" medicine. This must be done within 4.5 hours of stroke symptoms. Clot buster medicine can help break up a clot that is blocking the blood supply to the brain.   Treatment for hemorrhagic stroke is often surgical. This may be done by placing a metal clip at the base of the bulging or weak spot on a blood vessel (aneurysm). Strokes caused by blood vessels that are not normally formed (arteriovenous malformations) may be treated by removing the abnormal vessels.   TIME IS OF THE ESSENCE! Medications to dissolve a blood clot can only be used within 4.5 hours of the onset of symptoms. After the 4.5 hour window has passed, treatment may include rest, oxygen, intravenous fluids, and medications to thin the blood (to prevent another stroke). Treatment of stroke depends on duration of symptoms, severity, and cause. Medications and diet may be used to address diabetes, high blood pressure, and other risk factors.  Physical therapy, speech therapy, and occupational therapy specialists will assess you and work to improve any functions impaired by the stroke. Measures will be taken to prevent short  and long-term complications, including aspiration pneumonia, blood clots in the legs, bedsores, and falls.    SEEK IMMEDIATE MEDICAL CARE IF: You experience any of the four major symptoms that may suggest a transient ischemic attack (TIA) or stroke These are:  A temporary loss of vision.  Temporary numbness on one side.  Temporary inability to speak (aphasia).  Isolated areas of weakness, which are temporary.   GO IMMEDIATELY TO THE NEAREST [place] or call 911.  It is important to be evaluated for TIA or stroke.  Since stroke treatment is best within 4.5 hours, you should always seek medical care.   Document Released: 12/28/2002  Document Re-Released: 10/22/2008 [facility] Patient Information 2011 [place]. Colorectal Cancer Screening Colorectal cancer  screening is done to detect early disease. Colorectal refers to the colon and rectum. The colon and rectum are located at the end of the large intestine (digestive system), and carry your bowel movements out of the body. Screening may be done even if you are not experiencing symptoms.  Colorectal cancer screening checks for:  Polyps. These are small growths in the lining of the colon that can turn cancerous.   Cancer that is already growing. Cancer is a cluster of abnormal cells that can cause problems in the body.  REASONS FOR COLORECTAL CANCER SCREENING  It is common for polyps to form in the lining of the colon, especially in older people. These polyps can be cancerous or become cancerous.   Caught early, colorectal cancer is treatable.   Cancer can be life threatening. Detecting or preventing cancer early can save your life and allow you to enjoy life longer.  TYPES OF SCREENING  Fecal occult blood testing. A stool sample is examined for blood in the laboratory.   Sigmoidoscopy. A sigmoidoscope is used to examine the rectum and lower colon. A sigmoidoscope is a flexible tube with a camera that is inserted through your anus to examine your lower rectum.   Colonoscopy. The longer colonoscope is used to examine the entire colon. A colonoscope is also a thin, flexible tube with a camera. This test examines the colon and rectum.  Other tests include:  Digital rectal exam.   Barium enema.   Stool DNA test.   Virtual colonoscopy is the use of computerized X-ray scan (computed tomography, CT) to take X-ray images of your colon.  WHO SHOULD HAVE COLORECTAL CANCER SCREENING?  Screening is recommended for all adults aged 50 to 71 years.  Screening is generally done every 5 to 10 years or more frequently if you have a family history or symptoms.  Screening is rarely recommended in adults aged 110 to 45 years. Screening is not recommended in adults aged 72 years and older. Your caregiver may  recommend screening at a younger age and more frequent screening if you have:  A history of colorectal cancer or polyps.   Family members with histories of colorectal cancer or polyps.   Inflammatory bowel disease, such as ulcerative colitis or Crohn's disease.   A type of hereditary colon cancer syndrome.  Talk with your caregiver about any symptoms, personal and family history. SYMPTOMS OF COLORECTAL CANCER It is important to discuss the following symptoms with your caregiver. These symptoms may be the result of other conditions and may be easily treated:  Rectal bleeding.   Blood in your stool.   Changes in bowel movements (hard or loose stools). These changes may last several weeks.   Abdominal cramping.   Feeling the pressure  to have a bowel movement when there is no bowel movement.   Feeling tired or weak.   Unexplained weight loss.   Unexplained low red blood cell count. This may also be called iron deficiency anemia.  HOME CARE INSTRUCTIONS   Follow up with your caregiver as directed.   Follow all instructions for preparation before your test as well as after.  PREVENTION  Following healthy lifestyle habits each day can reduce your chance of getting colorectal cancer and many other types of cancer:  Eat a healthy, well-balanced diet rich in fruits and vegetables and low in fats, sugars and cholesterol.   Stay active. Try to exercise at least 4 to 6 times per week for 30 minutes.   Maintain a healthy weight. Ask your caregiver what a healthy weight range is for you.   Women should only drink 1 alcoholic drink per day. Men should only drink 2 alcoholic drinks per day.   Quit smoking.  SEEK MEDICAL CARE IF:  You experience abdominal or rectal symptoms (see Symptoms of Colorectal Cancer).   Your gastrointestinal issues (constipation, diarrhea) do not go away as expected.   You have questions or concerns.  FOR FURTHER INFORMATION  American Academy of Family  Physicians www.familydoctor.org   Centers for Disease Control and Prevention http://www.wolf.info/   [place] Preventive Services Task Force www.uspreventiveservicestaskforce.[place] www.cancer.org  MAKE SURE YOU:   Understand these instructions.   Will watch your condition.   Will get help right away if you are not doing well or get worse.  Always follow up with your caregiver to find out the results of your tests. Not all test results may be available during your visit. If your test results are not back during the visit, make an appointment with your caregiver to find out the results. Do not assume everything is normal if you have not heard from your caregiver or the medical facility. It is important for you to follow up on all of your test results.  Document Released: 06/14/2009  [facility] Patient Information 2011 [place].

## 2010-08-25 NOTE — Assessment & Plan Note (Signed)
Patient's blood pressure today was 147/79. He is taking lisinopril 10 mg daily. Will recheck his blood pressure in 2 and half months. If any changes are needed to medication it will be done at that time. We will recheck a basic metabolic panel at next visit.

## 2010-08-25 NOTE — Assessment & Plan Note (Addendum)
Patient is currently taking metformin 500 mg twice daily. We will watch him on this dosage and recheck his hemoglobin A1c in 2 and half months. He is having no adverse side effects from the medication claims be taking as ordered. I did refill it today. We'll see him back in 2 and half months. At that point if his hemoglobin A1c is still uncontrolled we can consider increasing the dosage. I did a foot exam today. He is scheduled for an eye exam. He has recently had a hemoglobin A1c and fasting lipid panel. We will check a urine microalbumin to creatinine ratio at next visit.

## 2010-08-25 NOTE — Progress Notes (Signed)
Subjective:    Patient ID: Hayden Mckinney, male    DOB: 01/11/1945, 65 y.o.   MRN: [id number]  HPI: This is a 65 year old male patient who is new to our clinic. He is seen today for a hospital followup visit. He was in the hospital due to some diabetic problems. He said that he was treated at one point long ago, and went off his medication. He was hospitalized and restarted on medication. He was also started on high blood pressure medicine and high cholesterol medicine during this hospitalization. He states that he has had no problems since leaving the hospital and he has been compliant with his medications. He has no complaints at this time. He does asked for several refills today. He is a nonsmoker and has never been a smoker. He states that he is a widower for the past 10 years. 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 I talked to him a colonoscopy, he was familiar with the procedure and said that he would think about getting on the future. He has never been to see an eye doctor. He is having some gait instability, and would like to do some physical therapy.  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  HENT: Negative.   Eyes: Negative.   Respiratory: Negative.  Negative for cough, chest tightness, shortness of breath, wheezing and stridor.   Cardiovascular: Negative for chest pain, palpitations and leg swelling.  Gastrointestinal: Negative for nausea, vomiting, abdominal pain, diarrhea,  constipation and blood in stool.  Genitourinary: Negative.   Musculoskeletal: Negative.  Negative for back pain, joint swelling and arthralgias.  Skin: Negative.   Neurological: Negative.   Hematological: Negative.   Psychiatric/Behavioral: Negative.     Vitals: Blood pressure 147/79 Temperature 98.56F Pulse 88 Oxygen saturation 95% on room air Height 5 foot 10 inches Weight 160 pounds    Objective:   Physical Exam  Constitutional: He is oriented to person, place, and time. He appears well-developed and well-nourished.  HENT:  Head: Normocephalic and atraumatic.  Eyes: EOM are normal. Pupils are equal, round, and reactive to light.  Neck: Normal range of motion. Neck supple. No JVD present. No tracheal deviation present. No thyromegaly present.  Cardiovascular: Normal rate, regular rhythm, normal heart sounds and intact distal pulses.  Exam reveals no gallop and no friction rub.   No murmur heard. Pulmonary/Chest: Effort normal and breath sounds normal. No respiratory distress. He has no wheezes. He has no rales. He exhibits no tenderness.  Abdominal: Soft. Bowel sounds are normal. He exhibits no distension. There is no tenderness. There is no rebound and no guarding.  Musculoskeletal: Normal range of motion. He exhibits no edema and no tenderness.  Lymphadenopathy:    He has no cervical adenopathy.  Neurological: He is alert and oriented to person, place, and time. No cranial nerve deficit.  Skin: Skin is warm and dry. No rash noted. No erythema. No pallor.  Psychiatric: He has a normal mood and affect. His behavior is normal. Judgment and thought content normal.  Assessment & Plan:  1. Please see problem-oriented charting.  2. Hospital followup-patient was seen in hospital followup visit he is not having acute issues at this time. We'll make no changes to his medications at this time. We will see him back in approximately 2-1/2 months. At this time we'll recheck  hemoglobin A1c, check on his blood pressure. We'll probably check a basic metabolic panel at this time to check on renal function. Patient does have some gait instability so we will send him to physical therapy. Patient has not had an eye exam we will send him to an eye doctor today. We have given him information about colonoscopy and we'll check back with him about that at next visit.

## 2010-08-28 NOTE — Progress Notes (Signed)
i agree with Dr. Donneta Romberg assessment and plan.

## 2010-09-20 ENCOUNTER — Ambulatory Visit (INDEPENDENT_AMBULATORY_CARE_PROVIDER_SITE_OTHER): Payer: Medicare Other | Admitting: Internal Medicine

## 2010-09-20 ENCOUNTER — Encounter: Payer: Self-pay | Admitting: Internal Medicine

## 2010-09-20 VITALS — BP 143/87 | HR 68 | Temp 97.4°F | Ht 70.0 in | Wt 162.3 lb

## 2010-09-20 DIAGNOSIS — Z299 Encounter for prophylactic measures, unspecified: Secondary | ICD-10-CM

## 2010-09-20 DIAGNOSIS — I10 Essential (primary) hypertension: Secondary | ICD-10-CM

## 2010-09-20 DIAGNOSIS — E785 Hyperlipidemia, unspecified: Secondary | ICD-10-CM

## 2010-09-20 DIAGNOSIS — E119 Type 2 diabetes mellitus without complications: Secondary | ICD-10-CM

## 2010-09-20 DIAGNOSIS — Z23 Encounter for immunization: Secondary | ICD-10-CM

## 2010-09-20 LAB — GLUCOSE, CAPILLARY: Glucose-Capillary: 126 mg/dL — ABNORMAL HIGH (ref 70–99)

## 2010-09-20 MED ORDER — LISINOPRIL 10 MG PO TABS: 10.0000 mg | ORAL_TABLET | Freq: Every day | ORAL | Status: DC

## 2010-09-20 MED ORDER — PRAVASTATIN SODIUM 40 MG PO TABS: 40.0000 mg | ORAL_TABLET | Freq: Every day | ORAL | Status: DC

## 2010-09-20 MED ORDER — GLUCOSE BLOOD VI STRP: ORAL_STRIP | Status: DC

## 2010-09-20 NOTE — Assessment & Plan Note (Addendum)
Last HbA1c of 8.6 on 08/07/2010. Recently started metformin 500 mg by mouth twice a day, tolerating it well. He says that he walks about couple of miles everyday.  I advised him to see Barnabas Harries for diabetic education as his HbA1c was high and he agreed. We will see him back in about 2-3 months for followup check of HbA1c. I will check a urine microalbumin creatinine ratio today to

## 2010-09-20 NOTE — Patient Instructions (Signed)
Please make an appointment with Barnabas Harries for diabetic education. Please make an follow up appointment in 2-3 months for Diabetes. Please take all your medications regularly. Also continue doing exercise.

## 2010-09-20 NOTE — Progress Notes (Signed)
  Subjective:    Patient ID: Hayden Mckinney, male    DOB: 1945-04-11, 65 y.o.   MRN: [id number]  HPI Hayden Mckinney is a pleasant 65 year old man with past with history of DM 2, hypertension, hyperlipidemia who comes to the clinic for followup visit. He was seen by Dr. Doug Sou in August 2012. He saw the doctor after few years.  Started on metformin, lisinopril, pravastatin recently. His blood pressure is 143/87 today. He has no complaints today and comes for refill of his glucose strips. Denies any chest pain, she breath, abdominal pain, nausea vomiting, fever, diarrhea, headache. Foot exam is normal today.  He is waiting for appointment with ophthalmologist and physical therapy which was made during last visit.    Review of Systems    as per history of present illness, all the systems reviewed and negative Objective:   Physical Exam Constitutional: Vital signs reviewed.  Patient is a well-developed and well-nourished in no acute distress and cooperative with exam. Alert and oriented x3.  Head: Normocephalic and atraumatic Mouth: no erythema or exudates, MMM Eyes: PERRL, EOMI, conjunctivae normal, No scleral icterus.  Neck: Supple, Trachea midline normal ROM  Cardiovascular: RRR, S1 normal, S2 normal Pulmonary/Chest: CTAB, no wheezes, rales, or rhonchi Abdominal: Soft. Non-tender, non-distended, bowel sounds are normal, no masses, organomegaly, or guarding present.  Musculoskeletal: No joint deformities, erythema, or stiffness, ROM full and no nontender Neurological: A&O x3, Strenght is normal and symmetric bilaterally, cranial nerve II-XII are grossly intact, no focal motor deficit, sensory intact to light touch bilaterally.  Skin: Warm, dry and intact. No rash, cyanosis, or clubbing.           Assessment & Plan:

## 2010-09-21 LAB — MICROALBUMIN / CREATININE URINE RATIO: Microalb, Ur: 2.17 mg/dL — ABNORMAL HIGH (ref 0.00–1.89)

## 2010-10-23 ENCOUNTER — Ambulatory Visit (INDEPENDENT_AMBULATORY_CARE_PROVIDER_SITE_OTHER): Payer: Medicare Other | Admitting: Dietician

## 2010-10-23 ENCOUNTER — Ambulatory Visit: Payer: Medicare Other | Admitting: Dietician

## 2010-10-23 DIAGNOSIS — E119 Type 2 diabetes mellitus without complications: Secondary | ICD-10-CM

## 2010-10-24 ENCOUNTER — Ambulatory Visit: Payer: Medicare Other | Attending: Internal Medicine | Admitting: Physical Therapy

## 2010-10-24 DIAGNOSIS — R269 Unspecified abnormalities of gait and mobility: Secondary | ICD-10-CM | POA: Insufficient documentation

## 2010-10-24 DIAGNOSIS — M6281 Muscle weakness (generalized): Secondary | ICD-10-CM | POA: Insufficient documentation

## 2010-10-24 DIAGNOSIS — R5381 Other malaise: Secondary | ICD-10-CM | POA: Insufficient documentation

## 2010-10-24 DIAGNOSIS — IMO0001 Reserved for inherently not codable concepts without codable children: Secondary | ICD-10-CM | POA: Insufficient documentation

## 2010-10-24 DIAGNOSIS — I69998 Other sequelae following unspecified cerebrovascular disease: Secondary | ICD-10-CM | POA: Insufficient documentation

## 2010-10-26 ENCOUNTER — Ambulatory Visit: Payer: Medicare Other | Admitting: Physical Therapy

## 2010-10-27 NOTE — Progress Notes (Signed)
Medical Nutrition Therapy:  Appt start time: [id number] end time:  1630.  Assessment:  Primary concerns today: Blood sugar control and Meal planning Diagnosed with diabetes in 2008. Concerned since stroke with controlling blood sugar. Used to weigh more than 200 #. Lost job and is eating better now with less stress. Meter download shows 86% of readings > 140 and 14% in target.Usual eating pattern includes Meal 3 and 1+ snacks per day.   Intake healthy with excess sources of saturated fat daily Avoided foods include: soda, sweetened cereal and ice cream and desserts. Usual physical activity includes walking 30 minutes a day 6 days per week  Progress Towards Goal(s):  In progress   Nutritional Diagnosis:  NB-1.1 Food and nutrition-related knowledge deficit As related to lack of prior exposure to nutrition for diabetes.  As evidenced by patients report.  Intervention:  1- education about action of diabetes medications 2- education regarding goals for self monitoring 3- agree with increasing metformin to improve blood sugar control. 4- coordination of care: consider repeat lipid profile with diabetes under bette control and on a statin    Monitoring/Evaluation:  Dietary intake and meter download in 4 week(s)

## 2010-11-01 ENCOUNTER — Ambulatory Visit: Payer: Medicare Other | Admitting: Physical Therapy

## 2010-11-03 ENCOUNTER — Ambulatory Visit: Payer: Medicare Other | Admitting: Physical Therapy

## 2010-11-07 ENCOUNTER — Encounter: Payer: Medicare Other | Admitting: Physical Therapy

## 2010-11-09 ENCOUNTER — Ambulatory Visit: Payer: Medicare Other | Admitting: Physical Therapy

## 2010-11-13 ENCOUNTER — Encounter: Payer: Medicare Other | Admitting: Physical Therapy

## 2010-11-15 ENCOUNTER — Ambulatory Visit: Payer: Medicare Other | Admitting: Physical Therapy

## 2010-11-20 ENCOUNTER — Ambulatory Visit: Payer: Medicare Other | Admitting: Physical Therapy

## 2010-11-22 ENCOUNTER — Ambulatory Visit: Payer: Medicare Other | Admitting: Physical Therapy

## 2010-11-23 ENCOUNTER — Ambulatory Visit: Payer: Medicare Other | Admitting: Internal Medicine

## 2010-11-23 ENCOUNTER — Encounter: Payer: Self-pay | Admitting: Ophthalmology

## 2010-11-23 ENCOUNTER — Ambulatory Visit (INDEPENDENT_AMBULATORY_CARE_PROVIDER_SITE_OTHER): Payer: Medicare Other | Admitting: Dietician

## 2010-11-23 ENCOUNTER — Ambulatory Visit (INDEPENDENT_AMBULATORY_CARE_PROVIDER_SITE_OTHER): Payer: Medicare Other | Admitting: Ophthalmology

## 2010-11-23 VITALS — BP 143/81 | HR 70 | Temp 98.1°F | Ht 70.0 in | Wt 165.0 lb

## 2010-11-23 VITALS — BP 143/81

## 2010-11-23 DIAGNOSIS — E11628 Type 2 diabetes mellitus with other skin complications: Secondary | ICD-10-CM

## 2010-11-23 DIAGNOSIS — E119 Type 2 diabetes mellitus without complications: Secondary | ICD-10-CM

## 2010-11-23 DIAGNOSIS — I10 Essential (primary) hypertension: Secondary | ICD-10-CM

## 2010-11-23 DIAGNOSIS — E785 Hyperlipidemia, unspecified: Secondary | ICD-10-CM

## 2010-11-23 DIAGNOSIS — L02619 Cutaneous abscess of unspecified foot: Secondary | ICD-10-CM

## 2010-11-23 DIAGNOSIS — L03119 Cellulitis of unspecified part of limb: Secondary | ICD-10-CM | POA: Insufficient documentation

## 2010-11-23 DIAGNOSIS — E1169 Type 2 diabetes mellitus with other specified complication: Secondary | ICD-10-CM

## 2010-11-23 MED ORDER — SULFAMETHOXAZOLE-TRIMETHOPRIM 800-160 MG PO TABS: 1.0000 | ORAL_TABLET | Freq: Two times a day (BID) | ORAL | Status: AC

## 2010-11-23 MED ORDER — LISINOPRIL-HYDROCHLOROTHIAZIDE 10-12.5 MG PO TABS: 1.0000 | ORAL_TABLET | Freq: Every day | ORAL | Status: DC

## 2010-11-23 NOTE — Assessment & Plan Note (Addendum)
Patient is making progress with his diabetes, his HGA1c is down a full point to 6.7 with dietary changes and daily walking via meetings with Debera Lat. However, he has cellulitis from a poorly healed wound today which is likely related to his diabetes. Butch Penny had mentioned to him that we may need to increase his metformin dose. I went to speak with her and she was out of the office so I will not make any changes at this time given his A1C which is very good.

## 2010-11-23 NOTE — Assessment & Plan Note (Signed)
Patient appears to have cellulitis that has worsened over the last 3 days. Prescribed bactrim double strength BID for 7 days. Instructed patient to call if does not continue to heal.

## 2010-11-23 NOTE — Progress Notes (Signed)
Subjective:   Patient ID: Hayden Mckinney male   DOB: 05/24/1945 65 y.o.   MRN: [id number]  HPI: [name] is a 65 y.o. man who presents for diabetic education today and was found to have foot infection and was added on as an urgent visit.  Cellulitis: Foot is red, hot swollen, area of anterior right leg that is s/p sarcoma resection over 20 years ago is also erythematous and hot. Hit the top of his foot on a door 3 weeks ago and has gotten red and painful in the past 3 days. He is having pain with walking currently and having trouble doing his daily walk.  DM: Had appointment at ophthalmologist, reported there was some damage from diabetes. He was instructed to follow up in 6 months.  HTN: patient is taking medication faithfully but blood pressure is consistently slightly high-140s/80s. Denies any cough symptoms.  Past Medical History  Diagnosis Date  . Diabetes mellitus   . Cancer sarcoma of rt leg  . Sarcoma    Current Outpatient Prescriptions  Medication Sig Dispense Refill  . ACCU-CHEK FASTCLIX LANCETS MISC 1 Device by Does not apply route daily.  102 each  3  . aspirin 325 MG tablet Take 325 mg by mouth daily.        . Blood Glucose Monitoring Suppl (ACCU-CHEK NANO SMARTVIEW) W/DEVICE KIT 1 kit by Does not apply route daily. Use to check blood sugars once daily. Dx code 250.00  1 kit  0  . glucose blood (ACCU-CHEK SMARTVIEW) test strip Use to check blood sugar 3 times daily. Dx code 250.00  100 each  11  . metFORMIN (GLUCOPHAGE) 500 MG tablet Take 1 tablet (500 mg total) by mouth 2 (two) times daily with a meal.  60 tablet  6  . pravastatin (PRAVACHOL) 40 MG tablet Take 1 tablet (40 mg total) by mouth daily.  30 tablet  6  . pregabalin (LYRICA) 50 MG capsule Take 50 mg by mouth 2 (two) times daily.        Marland Kitchen lisinopril-hydrochlorothiazide (PRINZIDE) 10-12.5 MG per tablet Take 1 tablet by mouth daily.  60 tablet  2  . sulfamethoxazole-trimethoprim (BACTRIM DS) 800-160  MG per tablet Take 1 tablet by mouth 2 (two) times daily.  14 tablet  0   Family History  Problem Relation Age of Onset  . Cancer Mother   . Cancer Father   . Diabetes Brother    History   Social History  . Marital Status: Widowed    Spouse Name: N/A    Number of Children: N/A  . Years of Education: N/A   Occupational History  . custom kitchen cabinets     retired   Social History Main Topics  . Smoking status: Never Smoker   . Smokeless tobacco: Never Used  . Alcohol Use: Yes     6-7 beers once a week  . Drug Use: No  . Sexually Active: Not on file   Other Topics Concern  . Not on file   Social History Narrative  . No narrative on file   Review of Systems: Constitutional: Denies fever, chills.  Objective:  Physical Exam: Filed Vitals:   11/23/10 1057  BP: 143/81  Pulse: 70  Temp: 98.1 F (36.7 C)  TempSrc: Oral  Height: 5\' 10"  (1.778 m)  Weight: 165 lb (74.844 kg)   Constitutional: Vital signs reviewed.  Patient is a well-developed and well-nourished man in mild distress and cooperative with exam. Eyes:  PERRL, EOMI, conjunctivae normal, No scleral icterus.  Cardiovascular: RRR, S1 normal, S2 normal, no MRG, pulses symmetric and intact bilaterally Pulmonary/Chest: CTAB, no wheezes, rales, or rhonchi Abdominal: Soft. Non-tender, non-distended, bowel sounds are normal Extremities: pedal edema present in right foot, none on left. Scabbed lesion on right foot with surrounding area of erythema approximately 2 by 3 inches in size. Area of anterior right shin on posterior area of resection from sarcoma also erythematous and warm to the touch. Onychomycosis bilaterally.  Neurological: A&Ox 3, cranial nerve II-XII are grossly intact.  Skin: Warm, dry and intact. No rash, cyanosis, or clubbing.  Psychiatric: Normal mood and affect. speech and behavior is normal. Judgment and thought content normal. Cognition and memory are normal.   Assessment & Plan:

## 2010-11-23 NOTE — Patient Instructions (Signed)
Please take bactrim twice a day for 1 week. Please call clinic if skin infection is not improving or gets worse.

## 2010-11-23 NOTE — Assessment & Plan Note (Signed)
Systolic hypertension persistent. Will switch patient to lisinopril HCTZ for synergistic effect. Can check BMET at next visit to ensure that K is stable.

## 2010-11-23 NOTE — Patient Instructions (Addendum)
Your blood pressure today was 143/81. This should be closer to 130/< 80.   Your A1C today is 6.7%- very good!!!  Follow up in 3 months.  Remember to review the DASH- Diet Aproachs to Stop High Blood pressure (hypertension) .  Consider 1% or skim or soy milk is better for you.   Try to eat more vegetable sources of protein than animal sources- Tofu, beans, nuts and seeds  Remember FAST for signs of a stroke-  Face- is one side dropping?  arms- hold both arms out to side and if one floats downward this is a positive symptoms Speech- is it slurred? Time- get to the ER

## 2010-11-23 NOTE — Progress Notes (Signed)
Examined this patient with Dr. Marcello Moores.  Agree with her note but will add that the degree of warmth, tenderness, and erythema, while present, is mild.  Agree with outpatient treatment and instructions to return for any worsening.

## 2010-11-23 NOTE — Assessment & Plan Note (Addendum)
Measured non-fasting lipid panel today to see if his LDL is at goal on pravastatin 40mg , he has been on it since late July. LDL came back 113. Since he is diabetic, the goal is <100. He was nonfasting but this would underestimate his LDL, not overestimate it so we should intensify his statin treatment at his next visit.

## 2010-11-23 NOTE — Progress Notes (Signed)
Medical Nutrition Therapy:  Appt start time: [id number] end time:  1630.  Assessment:  Primary concerns today: Blood sugar control and Meal planning Diagnosed with diabetes in 2008. A1c greatly improved.  Meter download also improved shows 45% of readings > 140 and 55% in target.Usual eating pattern includes Meal 3 and 2+ snacks per day.   Intake fairly healthy with excess sources of saturated and trans fats  Usual physical activity includes walking 30 minutes a day 6 days per week  Progress Towards Goal(s):  Some progress   Nutritional Diagnosis:   NB-1.1 Food and nutrition-related knowledge improving As related to previous medical nutrition therapy  as evidenced by patients report of food intake and blood sugars.  Intervention:  1- education about DASH Diet 2- education regarding goals for A1C, blood pressure and lipids 3- coordination of care: consider repeat lipid profile and A1C target of 6% with increased metformin dose    Monitoring/Evaluation:  Dietary intake and meter download in 3 months(s)

## 2010-11-24 LAB — LIPID PANEL
HDL: 63 mg/dL (ref >39)
LDL Cholesterol: 113 mg/dL — ABNORMAL HIGH (ref 0–99)
Total CHOL/HDL Ratio: 3.1 Ratio
Triglycerides: 90 mg/dL (ref <150)

## 2010-12-15 ENCOUNTER — Encounter: Payer: Self-pay | Admitting: Internal Medicine

## 2010-12-15 ENCOUNTER — Ambulatory Visit (INDEPENDENT_AMBULATORY_CARE_PROVIDER_SITE_OTHER): Payer: Medicare Other | Admitting: Internal Medicine

## 2010-12-15 VITALS — BP 130/72 | HR 79 | Temp 97.2°F | Ht 70.0 in | Wt 164.0 lb

## 2010-12-15 DIAGNOSIS — E785 Hyperlipidemia, unspecified: Secondary | ICD-10-CM

## 2010-12-15 DIAGNOSIS — E1169 Type 2 diabetes mellitus with other specified complication: Secondary | ICD-10-CM

## 2010-12-15 DIAGNOSIS — I10 Essential (primary) hypertension: Secondary | ICD-10-CM

## 2010-12-15 DIAGNOSIS — E119 Type 2 diabetes mellitus without complications: Secondary | ICD-10-CM

## 2010-12-15 DIAGNOSIS — L03119 Cellulitis of unspecified part of limb: Secondary | ICD-10-CM

## 2010-12-15 LAB — GLUCOSE, CAPILLARY: Glucose-Capillary: 112 mg/dL — ABNORMAL HIGH (ref 70–99)

## 2010-12-15 NOTE — Assessment & Plan Note (Signed)
Will obtain basic metabolic panel at today's visit to ensure that his potassium is not low. He was started on lisinopril/HCTZ at last visit. His blood pressure is at goal today 130/72. I did encourage him to continue with his medications and congratulated him. We'll see him back in 2 months.

## 2010-12-15 NOTE — Assessment & Plan Note (Signed)
Patient last LDL 113. Did consider increasing his lipid medication. However will reassess in 2 months at followup visit. No changes made to regimen at today's visit.

## 2010-12-15 NOTE — Assessment & Plan Note (Signed)
Patient does have resolution of his cellulitis of right foot. There is a posterior over the area where the cellulitis injury initiated. Have informed him to wear comfortable shoes and continue checking his foot and monitoring them regularly for any wounds. He did evaluate both feet today and inspect him no other wounds were found. There was no surrounding erythema over her where this past cellulitis was. Would consider him healed. He did take his full course of Bactrim DS.

## 2010-12-15 NOTE — Assessment & Plan Note (Signed)
Patient is taking his medications as prescribed. He is currently doing well with his diabetes. His hemoglobin A1c is 6.7. We will see him back in 2 months for a recheck of his hemoglobin A1c. He did bring his meter in with him today and his values are mostly within normal range. No change to regimen at today's visit. No symptoms of hyperglycemic events or hypoglycemic events.

## 2010-12-15 NOTE — Progress Notes (Signed)
agree

## 2010-12-15 NOTE — Patient Instructions (Signed)
You were seen today for a follow up of your foot injury. It looks to be healing nicely. Keep watching your feet for any new signs of wounds or worsening. Your diabetes is under good control! Keep up the good work. Your blood pressure is also under good control! Happy holidays and feel free to call our office if you have any questions or need any refills. Our number is [phone]. Otherwise, we'll see you back in 2 months to check on your diabetes.

## 2010-12-15 NOTE — Progress Notes (Signed)
  Subjective:    Patient ID: Hayden Mckinney, male    DOB: 08-16-1945, 65 y.o.   MRN: [id number]  HPI The patient is a 65 year old male who comes in today for followup of a foot injury. He was seen approximately one month ago and given a 7 day course of Bactrim DS. He did complete his course. He has had resolution of this wound. The redness and swelling is gone. The warmth has also gone. He now has the appearance of two healing blisters. He's not had any fevers, chills, abdominal pain, chest pain, neurologic symptoms. He states he has been walking more and watching his diet. He has been taking his medications as prescribed. No hypoglycemic events. He brings in his meter for review today and his medications.    Review of Systems  Constitutional: Negative for fever, chills, activity change, appetite change, fatigue and unexpected weight change.  HENT: Negative.   Eyes: Negative.   Respiratory: Negative for cough, chest tightness, shortness of breath and wheezing.   Cardiovascular: Negative for chest pain, palpitations and leg swelling.  Gastrointestinal: Negative for nausea, vomiting, abdominal pain, diarrhea, constipation and abdominal distention.  Skin: Positive for wound.       Healing wound on the right foot.    Neurological: Negative for dizziness, tremors, seizures, syncope, speech difficulty, weakness, numbness and headaches.  Psychiatric/Behavioral: Negative.     Vitals: Blood pressure: 130/72 Pulse: 79 Temp: 97.50F Height: 5 feet 10 inches Oxygen saturation on room air: 99% Weight: 164 pounds    Objective:   Physical Exam  Vitals reviewed. Constitutional: He is oriented to person, place, and time. He appears well-developed and well-nourished.  HENT:  Head: Normocephalic and atraumatic.  Eyes: EOM are normal. Pupils are equal, round, and reactive to light.  Neck: Normal range of motion. Neck supple.  Cardiovascular: Normal rate, regular rhythm and normal heart sounds.     No murmur heard. Pulmonary/Chest: Effort normal and breath sounds normal. No respiratory distress. He has no wheezes. He has no rales.  Abdominal: Soft. Bowel sounds are normal. He exhibits no distension. There is no tenderness. There is no rebound.  Musculoskeletal: Normal range of motion.  Neurological: He is alert and oriented to person, place, and time. No cranial nerve deficit.  Skin: Skin is warm and dry. No rash noted. No erythema. No pallor.       Two blister areas on the lateral aspect of the right foot. Appear to be healing. No erythema, warmth, swelling, tenderness. The skin appears to be dry around the area. No other wounds on either foot.          Assessment & Plan:  1. Please see problem-oriented charting.  2. Disposition-patient will be seen back in 2 months to check on his diabetes and get repeat hemoglobin A1c. No changes to his medical regimen at this time. His foot appears to be healing nicely.

## 2010-12-16 LAB — BASIC METABOLIC PANEL WITH GFR
CO2: 25 mEq/L (ref 19–32)
Chloride: 107 mEq/L (ref 96–112)
Potassium: 5.1 mEq/L (ref 3.5–5.3)
Sodium: 138 mEq/L (ref 135–145)

## 2011-03-30 ENCOUNTER — Other Ambulatory Visit: Payer: Self-pay | Admitting: *Deleted

## 2011-03-30 MED ORDER — PREGABALIN 50 MG PO CAPS: 50.0000 mg | ORAL_CAPSULE | Freq: Two times a day (BID) | ORAL | Status: DC

## 2011-03-31 ENCOUNTER — Other Ambulatory Visit: Payer: Self-pay | Admitting: Internal Medicine

## 2011-04-02 MED ORDER — PREGABALIN 50 MG PO CAPS: 50.0000 mg | ORAL_CAPSULE | Freq: Two times a day (BID) | ORAL | Status: DC

## 2011-04-02 NOTE — Telephone Encounter (Signed)
Rx phoned into pharmacy.

## 2011-06-08 ENCOUNTER — Encounter: Payer: Self-pay | Admitting: Internal Medicine

## 2011-06-08 ENCOUNTER — Ambulatory Visit (INDEPENDENT_AMBULATORY_CARE_PROVIDER_SITE_OTHER): Payer: Medicare Other | Admitting: Ophthalmology

## 2011-06-08 ENCOUNTER — Encounter: Payer: Self-pay | Admitting: Ophthalmology

## 2011-06-08 ENCOUNTER — Encounter: Payer: Medicare Other | Admitting: Internal Medicine

## 2011-06-08 VITALS — BP 130/71 | HR 79 | Temp 97.0°F | Ht 70.0 in | Wt 168.0 lb

## 2011-06-08 DIAGNOSIS — E119 Type 2 diabetes mellitus without complications: Secondary | ICD-10-CM

## 2011-06-08 DIAGNOSIS — E785 Hyperlipidemia, unspecified: Secondary | ICD-10-CM

## 2011-06-08 DIAGNOSIS — Z0000 Encounter for general adult medical examination without abnormal findings: Secondary | ICD-10-CM

## 2011-06-08 DIAGNOSIS — I10 Essential (primary) hypertension: Secondary | ICD-10-CM

## 2011-06-08 LAB — GLUCOSE, CAPILLARY: Glucose-Capillary: 167 mg/dL — ABNORMAL HIGH (ref 70–99)

## 2011-06-08 LAB — POCT GLYCOSYLATED HEMOGLOBIN (HGB A1C): Hemoglobin A1C: 8.7

## 2011-06-08 MED ORDER — LISINOPRIL-HYDROCHLOROTHIAZIDE 10-12.5 MG PO TABS: 1.0000 | ORAL_TABLET | Freq: Every day | ORAL | Status: DC

## 2011-06-08 MED ORDER — PRAVASTATIN SODIUM 80 MG PO TABS: 80.0000 mg | ORAL_TABLET | Freq: Every evening | ORAL | Status: DC

## 2011-06-08 MED ORDER — METFORMIN HCL 1000 MG PO TABS: 1000.0000 mg | ORAL_TABLET | Freq: Two times a day (BID) | ORAL | Status: DC

## 2011-06-08 NOTE — Progress Notes (Signed)
  Subjective:   Patient ID: Hayden Mckinney male   DOB: October 22, 1945 66 y.o.   MRN: [id number]  HPI: Mr.Hayden Mckinney is a 66 y.o. man who is here for routine follow up for diabetes, HTN.  DM- brought meter today,  Got shaky 2-3 times when he is out mowing the yard , no fainting, highest blood sugar- 300s, usually less than 220, has been running a bit higher in last few months. Walk couple of miles- almost everyday. Still having some pain across top of his toes 6-7 times a day for just a few moments, but numbness is much better on lyrica. Hasn't checked blood sugar in past several months. Price of chest strips increased from $14 to $71.   HTN- 130/71 today, BMET checked in January  HLD- can increase pravachol  RHM- patient is due for colonoscopy  Past Medical History  Diagnosis Date  . Diabetes mellitus   . Cancer sarcoma of rt leg  . Sarcoma    Current Outpatient Prescriptions  Medication Sig Dispense Refill  . ACCU-CHEK FASTCLIX LANCETS MISC 1 Device by Does not apply route daily.  102 each  3  . aspirin 325 MG tablet Take 325 mg by mouth daily.        . Blood Glucose Monitoring Suppl (ACCU-CHEK NANO SMARTVIEW) W/DEVICE KIT 1 kit by Does not apply route daily. Use to check blood sugars once daily. Dx code 250.00  1 kit  0  . glucose blood (ACCU-CHEK SMARTVIEW) test strip Use to check blood sugar 3 times daily. Dx code 250.00  100 each  11  . lisinopril-hydrochlorothiazide (PRINZIDE) 10-12.5 MG per tablet Take 1 tablet by mouth daily.  60 tablet  2  . metFORMIN (GLUCOPHAGE) 500 MG tablet Take 1 tablet (500 mg total) by mouth 2 (two) times daily with a meal.  60 tablet  6  . pravastatin (PRAVACHOL) 40 MG tablet Take 1 tablet (40 mg total) by mouth daily.  30 tablet  6  . pregabalin (LYRICA) 50 MG capsule Take 1 capsule (50 mg total) by mouth 2 (two) times daily.  60 capsule  6   Family History  Problem Relation Age of Onset  . Cancer Mother   . Cancer Father   . Diabetes  Brother    History   Social History  . Marital Status: Widowed    Spouse Name: N/A    Number of Children: N/A  . Years of Education: N/A   Occupational History  . custom kitchen cabinets     retired   Social History Main Topics  . Smoking status: Never Smoker   . Smokeless tobacco: Never Used  . Alcohol Use: Yes     6-7 beers once a week  . Drug Use: No  . Sexually Active: None   Other Topics Concern  . None   Social History Narrative  . None   Objective:  Physical Exam: Filed Vitals:   06/08/11 0931  BP: 130/71  Pulse: 79  Temp: 97 F (36.1 C)  TempSrc: Oral  Height: 5\' 10"  (1.778 m)  Weight: 168 lb (76.204 kg)   General: sitting in chair HEENT: PERRL, EOMI, no scleral icterus Cardiac: RRR, no rubs, murmurs or gallops Pulm: clear to auscultation bilaterally, moving normal volumes of air Abd: soft, nontender, nondistended, BS present Ext: warm and well perfused, no pedal edema Neuro: alert and oriented X3, cranial nerves II-XII grossly intact  Assessment & Plan:

## 2011-06-08 NOTE — Assessment & Plan Note (Signed)
Patient's A1c is much worse today 8.7 vs. 6.7 on prior visit. I explained this to him and he feels that since he has not been checking his blood sugar daily he has not been paying attention to his diet. Asked him to check blood sugar once daily at different times of day and focus on his diet. He has already met with Debera Lat and feels that he knows how to improve his diet. Increased metformin to 1000 mg BID and asked him to call with symptoms of hypoglycemia although this is not likely with only metformin. If he is not under better control at next visit could consider adding glipizide or other oral agent. I explained to him that if he does not get diabetes under better control he will have to start on insulin in the near future. He is already walking several miles a day.

## 2011-06-08 NOTE — Assessment & Plan Note (Signed)
HTN well controlled, will continue HCTZ/lisinopril.

## 2011-06-08 NOTE — Patient Instructions (Addendum)
-  Check blood sugar once a day at different times of day (before and after all 3 meals) -CHANGE dose of metformin to 1000mg  twice a day (please take TWO 500mg  pills twice a day until you get new 1000 mg pills) -CHANGE pravastatin dose to 80mg  daily (take TWO 40mg  pills once daily until you get new 80mg  pills) -keep up the walking that you are doing! -try to make an effort to limit portion sizes and avoid carbohydrate heavy food like bread, rice, potatoes, grits, desserts etc.

## 2011-06-08 NOTE — Assessment & Plan Note (Addendum)
Patient's last LDL was not at goal for a diabetic patient (LDL<100) so I increased his pravastatin dose to 80mg . At follow up visit consider gettin LFTs to ensure that there is no increase. Will recheck lipid panel and see if he is within goal on higher dose.

## 2011-06-26 ENCOUNTER — Ambulatory Visit (AMBULATORY_SURGERY_CENTER): Payer: Medicare Other

## 2011-06-26 VITALS — Ht 70.0 in | Wt 166.2 lb

## 2011-06-26 DIAGNOSIS — Z1211 Encounter for screening for malignant neoplasm of colon: Secondary | ICD-10-CM

## 2011-06-26 MED ORDER — SUPREP BOWEL PREP KIT 17.5-3.13-1.6 GM/177ML PO SOLN: ORAL | Status: DC

## 2011-07-10 ENCOUNTER — Encounter: Payer: Medicare Other | Admitting: Internal Medicine

## 2011-08-09 ENCOUNTER — Encounter: Payer: Medicare Other | Admitting: Internal Medicine

## 2011-09-14 ENCOUNTER — Encounter: Payer: Medicare Other | Admitting: Internal Medicine

## 2011-11-21 ENCOUNTER — Other Ambulatory Visit: Payer: Self-pay | Admitting: *Deleted

## 2011-11-22 MED ORDER — PREGABALIN 50 MG PO CAPS: 50.0000 mg | ORAL_CAPSULE | Freq: Two times a day (BID) | ORAL | Status: DC

## 2011-11-22 NOTE — Telephone Encounter (Signed)
Lyrica rx called to [facility]. 

## 2011-12-14 ENCOUNTER — Encounter: Payer: Self-pay | Admitting: Internal Medicine

## 2011-12-14 ENCOUNTER — Ambulatory Visit (INDEPENDENT_AMBULATORY_CARE_PROVIDER_SITE_OTHER): Payer: Medicare Other | Admitting: Internal Medicine

## 2011-12-14 VITALS — BP 133/72 | HR 79 | Temp 97.8°F | Ht 70.0 in | Wt 168.9 lb

## 2011-12-14 DIAGNOSIS — E1142 Type 2 diabetes mellitus with diabetic polyneuropathy: Secondary | ICD-10-CM

## 2011-12-14 DIAGNOSIS — Z23 Encounter for immunization: Secondary | ICD-10-CM

## 2011-12-14 DIAGNOSIS — I10 Essential (primary) hypertension: Secondary | ICD-10-CM

## 2011-12-14 DIAGNOSIS — E785 Hyperlipidemia, unspecified: Secondary | ICD-10-CM

## 2011-12-14 DIAGNOSIS — E1149 Type 2 diabetes mellitus with other diabetic neurological complication: Secondary | ICD-10-CM

## 2011-12-14 DIAGNOSIS — E119 Type 2 diabetes mellitus without complications: Secondary | ICD-10-CM

## 2011-12-14 LAB — POCT GLYCOSYLATED HEMOGLOBIN (HGB A1C): Hemoglobin A1C: 7.9

## 2011-12-14 MED ORDER — RELION CONFIRM GLUCOSE MONITOR W/DEVICE KIT: PACK | Status: DC

## 2011-12-14 NOTE — Patient Instructions (Addendum)
General Instructions:  Keep taking your medications as instructed. We will let you know about your cholesterol results. We are still working on your sugars.  Work on exercising every day and think about getting an eye exam for your diabetes. Consider seeing a foot doctor for the fungus and to trim your toenails.   Come back in 3 months and if you have questions or problems before then please call us at [phone].  Treatment Goals:  Goals (1 Years of Data) as of 12/14/2011          As of Today 06/08/11 11/23/10     Result Component    . HDL > 40    63    . HEMOGLOBIN A1C < 7.0  7.9 8.7 6.7    . LDL CALC < 130    113      Progress Toward Treatment Goals:  Treatment Goal 12/14/2011  Hemoglobin A1C improved  Blood pressure at goal    Self Care Goals & Plans:    Home Blood Glucose Monitoring 12/14/2011  Check my blood sugar once a day  When to check my blood sugar before breakfast     Care Management & Community Referrals:  Referral 12/14/2011  Referrals made for care management support none needed

## 2011-12-15 LAB — LIPID PANEL
LDL Cholesterol: 146 mg/dL — ABNORMAL HIGH (ref 0–99)
VLDL: 67 mg/dL — ABNORMAL HIGH (ref 0–40)

## 2011-12-17 ENCOUNTER — Telehealth: Payer: Self-pay | Admitting: *Deleted

## 2011-12-17 NOTE — Assessment & Plan Note (Signed)
Recheck lipid panel at today's visit. Will adjust medication as needed.

## 2011-12-17 NOTE — Assessment & Plan Note (Signed)
BP Readings from Last 3 Encounters:  12/14/11 133/72  06/08/11 130/71  12/15/10 130/72    Lab Results  Component Value Date   NA 138 12/15/2010   K 5.1 12/15/2010   CREATININE 0.97 12/15/2010    Assessment:  Blood pressure control: controlled  Progress toward BP goal:  at goal  Comments: good control at this time  Plan:  Medications:  continue current medications, lisinopril/HCTZ 10/12.5 mg daily  Educational resources provided: brochure  Self management tools provided:    Other plans:

## 2011-12-17 NOTE — Assessment & Plan Note (Signed)
Lab Results  Component Value Date   HGBA1C 7.9 12/14/2011   HGBA1C 8.7 06/08/2011   HGBA1C 6.7 11/23/2010     Assessment:  Diabetes control: fair control  Progress toward A1C goal:  improved  Comments: much better than last HgA1c but still needs some work to be at goal again  Plan:  Medications:  continue current medications, Metformin 1000 mg BID  Home glucose monitoring:   Frequency: once a day   Timing: before breakfast  Instruction/counseling given: reminded to get eye exam, reminded to bring blood glucose meter & log to each visit and discussed foot care  Educational resources provided: brochure  Self management tools provided: instructions for home glucose monitoring  Other plans: Will continue to watch however if not able to lose some weight may need to add an additional agent.

## 2011-12-17 NOTE — Telephone Encounter (Signed)
Pharmacy needs to know how often is pt checking CBG.

## 2011-12-17 NOTE — Progress Notes (Signed)
Subjective:     Patient ID: Hayden Mckinney, male   DOB: [birth year], 66 y.o.   MRN: [id number]  HPI The patient is a 66 YO male who comes in for a return follow up visit. He has been away from the clinic for many months and had a brother that recently died of cancer and he was helping to care for. This caused him to lose track of his own medical concerns and now returns to the clinic. He is continuing to take his medications and he is not having any problems. He admits that he has been having some dietary indiscretions recently with the holidays. He is not having any recurrence of the skin infection on his foot and does not have any ulcers on his feet at this time. He is not having any chest pain, SOB, nausea, vomiting, or cold symptoms. He does have some neuropathy in his legs (they are mostly numb) however lyrica helps these. He would like a different meter at today's visit as the strips are too expensive and he would like to switch.   Review of Systems  Constitutional: Negative for fever, chills, diaphoresis, activity change, appetite change, fatigue and unexpected weight change.  HENT: Negative.   Eyes: Negative.   Respiratory: Negative for cough, chest tightness, shortness of breath and wheezing.   Cardiovascular: Negative for chest pain, palpitations and leg swelling.  Gastrointestinal: Negative for nausea, vomiting, diarrhea, constipation and abdominal distention.  Musculoskeletal: Negative.   Neurological: Negative.        Objective:   Physical Exam  Constitutional: He is oriented to person, place, and time. He appears well-developed and well-nourished. No distress.  HENT:  Head: Normocephalic and atraumatic.  Eyes: EOM are normal. Pupils are equal, round, and reactive to light.  Neck: Normal range of motion. Neck supple. No JVD present.       No bruit  Cardiovascular: Normal rate and regular rhythm.   No murmur heard. Pulmonary/Chest: Effort normal and breath sounds normal. No  respiratory distress.  Abdominal: Soft. Bowel sounds are normal. He exhibits no distension. There is no tenderness.       Some abdominal adiposity.  Musculoskeletal: Normal range of motion. He exhibits no edema and no tenderness.  Neurological: He is alert and oriented to person, place, and time. No cranial nerve deficit.  Skin: Skin is warm and dry.       Foot is well healed and no open ulcers or heavy callous formation.       Assessment/Plan:   1. Please see problem oriented charting.  2. The patient will come back in 3 months. Lipid panel was drawn at today's visit and foot exam was done. Flu shot was given. Patient has prep for colonoscopy at home and advised him that getting that done would be helpful. No changes to his medications at today's visit. Ordered relion meter but advised him that he does not need to test everyday and may be better off only testing if he feels he is having a low sugar.

## 2011-12-19 NOTE — Telephone Encounter (Signed)
Info on checking CBG faxed to pharmacy per Dr Doug Sou.

## 2011-12-19 NOTE — Telephone Encounter (Signed)
We discussed checking max once per day but will likely phase out slowly.   Thanks,  Dr. Doug Sou

## 2012-03-31 ENCOUNTER — Encounter: Payer: Self-pay | Admitting: Internal Medicine

## 2012-07-17 ENCOUNTER — Other Ambulatory Visit: Payer: Self-pay

## 2012-11-12 ENCOUNTER — Encounter: Payer: Self-pay | Admitting: Internal Medicine

## 2013-03-27 ENCOUNTER — Encounter: Payer: Medicare Other | Admitting: Internal Medicine

## 2014-04-16 ENCOUNTER — Encounter (HOSPITAL_COMMUNITY): Payer: Self-pay | Admitting: Emergency Medicine

## 2014-04-16 ENCOUNTER — Emergency Department (HOSPITAL_COMMUNITY): Payer: Medicare Other

## 2014-04-16 ENCOUNTER — Inpatient Hospital Stay (HOSPITAL_COMMUNITY)
Admission: EM | Admit: 2014-04-16 | Discharge: 2014-04-21 | DRG: 064 | Disposition: A | Discharge status: Rehabilitation Facility | Payer: Medicare Other | Attending: Internal Medicine | Admitting: Internal Medicine

## 2014-04-16 DIAGNOSIS — R4701 Aphasia: Secondary | ICD-10-CM | POA: Diagnosis present

## 2014-04-16 DIAGNOSIS — F1010 Alcohol abuse, uncomplicated: Secondary | ICD-10-CM | POA: Diagnosis present

## 2014-04-16 DIAGNOSIS — G8191 Hemiplegia, unspecified affecting right dominant side: Secondary | ICD-10-CM | POA: Diagnosis present

## 2014-04-16 DIAGNOSIS — E785 Hyperlipidemia, unspecified: Secondary | ICD-10-CM | POA: Diagnosis not present

## 2014-04-16 DIAGNOSIS — R1310 Dysphagia, unspecified: Secondary | ICD-10-CM | POA: Diagnosis present

## 2014-04-16 DIAGNOSIS — J690 Pneumonitis due to inhalation of food and vomit: Secondary | ICD-10-CM | POA: Diagnosis not present

## 2014-04-16 DIAGNOSIS — E1165 Type 2 diabetes mellitus with hyperglycemia: Secondary | ICD-10-CM | POA: Diagnosis not present

## 2014-04-16 DIAGNOSIS — I639 Cerebral infarction, unspecified: Secondary | ICD-10-CM | POA: Diagnosis present

## 2014-04-16 DIAGNOSIS — I6340 Cerebral infarction due to embolism of unspecified cerebral artery: Principal | ICD-10-CM | POA: Diagnosis present

## 2014-04-16 DIAGNOSIS — I6312 Cerebral infarction due to embolism of basilar artery: Secondary | ICD-10-CM | POA: Diagnosis not present

## 2014-04-16 DIAGNOSIS — E860 Dehydration: Secondary | ICD-10-CM | POA: Diagnosis present

## 2014-04-16 DIAGNOSIS — I6521 Occlusion and stenosis of right carotid artery: Secondary | ICD-10-CM | POA: Diagnosis present

## 2014-04-16 DIAGNOSIS — Z7982 Long term (current) use of aspirin: Secondary | ICD-10-CM | POA: Diagnosis not present

## 2014-04-16 DIAGNOSIS — E1149 Type 2 diabetes mellitus with other diabetic neurological complication: Secondary | ICD-10-CM | POA: Diagnosis present

## 2014-04-16 DIAGNOSIS — I10 Essential (primary) hypertension: Secondary | ICD-10-CM | POA: Diagnosis present

## 2014-04-16 DIAGNOSIS — I6359 Cerebral infarction due to unspecified occlusion or stenosis of other cerebral artery: Secondary | ICD-10-CM | POA: Diagnosis not present

## 2014-04-16 DIAGNOSIS — C419 Malignant neoplasm of bone and articular cartilage, unspecified: Secondary | ICD-10-CM

## 2014-04-16 DIAGNOSIS — I129 Hypertensive chronic kidney disease with stage 1 through stage 4 chronic kidney disease, or unspecified chronic kidney disease: Secondary | ICD-10-CM | POA: Diagnosis present

## 2014-04-16 DIAGNOSIS — E1142 Type 2 diabetes mellitus with diabetic polyneuropathy: Secondary | ICD-10-CM | POA: Diagnosis present

## 2014-04-16 DIAGNOSIS — N179 Acute kidney failure, unspecified: Secondary | ICD-10-CM | POA: Diagnosis present

## 2014-04-16 DIAGNOSIS — R809 Proteinuria, unspecified: Secondary | ICD-10-CM | POA: Diagnosis present

## 2014-04-16 DIAGNOSIS — N189 Chronic kidney disease, unspecified: Secondary | ICD-10-CM | POA: Diagnosis present

## 2014-04-16 DIAGNOSIS — I6349 Cerebral infarction due to embolism of other cerebral artery: Secondary | ICD-10-CM | POA: Diagnosis not present

## 2014-04-16 DIAGNOSIS — K5901 Slow transit constipation: Secondary | ICD-10-CM | POA: Diagnosis not present

## 2014-04-16 DIAGNOSIS — Z8673 Personal history of transient ischemic attack (TIA), and cerebral infarction without residual deficits: Secondary | ICD-10-CM | POA: Diagnosis not present

## 2014-04-16 DIAGNOSIS — Z79899 Other long term (current) drug therapy: Secondary | ICD-10-CM | POA: Diagnosis not present

## 2014-04-16 DIAGNOSIS — I6789 Other cerebrovascular disease: Secondary | ICD-10-CM | POA: Diagnosis not present

## 2014-04-16 DIAGNOSIS — E1140 Type 2 diabetes mellitus with diabetic neuropathy, unspecified: Secondary | ICD-10-CM | POA: Diagnosis not present

## 2014-04-16 DIAGNOSIS — R471 Dysarthria and anarthria: Secondary | ICD-10-CM | POA: Diagnosis present

## 2014-04-16 DIAGNOSIS — R479 Unspecified speech disturbances: Secondary | ICD-10-CM | POA: Diagnosis not present

## 2014-04-16 HISTORY — DX: Type 2 diabetes mellitus without complications: E11.9

## 2014-04-16 HISTORY — DX: Alcohol abuse, uncomplicated: F10.10

## 2014-04-16 HISTORY — DX: Type 2 diabetes mellitus with diabetic polyneuropathy: E11.42

## 2014-04-16 HISTORY — DX: Personal history of other medical treatment: Z92.89

## 2014-04-16 HISTORY — DX: Transient cerebral ischemic attack, unspecified: G45.9

## 2014-04-16 LAB — URINE MICROSCOPIC-ADD ON

## 2014-04-16 LAB — BASIC METABOLIC PANEL
Anion gap: 9 (ref 5–15)
BUN: 33 mg/dL — AB (ref 6–23)
CALCIUM: 9.3 mg/dL (ref 8.4–10.5)
CO2: 26 mmol/L (ref 19–32)
Chloride: 104 mmol/L (ref 96–112)
Creatinine, Ser: 1.81 mg/dL — ABNORMAL HIGH (ref 0.50–1.35)
GFR calc Af Amer: 42 mL/min — ABNORMAL LOW (ref >90)
GFR calc non Af Amer: 36 mL/min — ABNORMAL LOW (ref >90)
GLUCOSE: 209 mg/dL — AB (ref 70–99)
Potassium: 4 mmol/L (ref 3.5–5.1)
Sodium: 139 mmol/L (ref 135–145)

## 2014-04-16 LAB — CBC
HCT: 40.2 % (ref 39.0–52.0)
Hemoglobin: 13.3 g/dL (ref 13.0–17.0)
MCH: 27.7 pg (ref 26.0–34.0)
MCHC: 33.1 g/dL (ref 30.0–36.0)
MCV: 83.8 fL (ref 78.0–100.0)
PLATELETS: 287 10*3/uL (ref 150–400)
RBC: 4.8 MIL/uL (ref 4.22–5.81)
RDW: 12.9 % (ref 11.5–15.5)
WBC: 9 10*3/uL (ref 4.0–10.5)

## 2014-04-16 LAB — URINALYSIS, ROUTINE W REFLEX MICROSCOPIC
Bilirubin Urine: NEGATIVE
Glucose, UA: 100 mg/dL — AB
Ketones, ur: 15 mg/dL — AB
Leukocytes, UA: NEGATIVE
NITRITE: NEGATIVE
SPECIFIC GRAVITY, URINE: 1.02 (ref 1.005–1.030)
UROBILINOGEN UA: 1 mg/dL (ref 0.0–1.0)
pH: 5 (ref 5.0–8.0)

## 2014-04-16 LAB — CBG MONITORING, ED: Glucose-Capillary: 312 mg/dL — ABNORMAL HIGH (ref 70–99)

## 2014-04-16 MED ORDER — ENOXAPARIN SODIUM 40 MG/0.4ML ~~LOC~~ SOLN
40.0000 mg | SUBCUTANEOUS | Status: DC
  Administered 2014-04-16 – 2014-04-20 (×5): 40 mg via SUBCUTANEOUS
  Filled 2014-04-16 (×5): qty 0.4

## 2014-04-16 MED ORDER — METOPROLOL TARTRATE 1 MG/ML IV SOLN: 2.5000 mg | Freq: Three times a day (TID) | INTRAVENOUS | Status: DC | PRN

## 2014-04-16 MED ORDER — ASPIRIN 300 MG RE SUPP
300.0000 mg | Freq: Every day | RECTAL | Status: DC
  Administered 2014-04-16 – 2014-04-17 (×2): 300 mg via RECTAL
  Filled 2014-04-16 (×2): qty 1

## 2014-04-16 MED ORDER — SODIUM CHLORIDE 0.9 % IV SOLN
INTRAVENOUS | Status: DC
  Administered 2014-04-16: 22:00:00 via INTRAVENOUS

## 2014-04-16 MED ORDER — INSULIN ASPART 100 UNIT/ML ~~LOC~~ SOLN
0.0000 [IU] | Freq: Three times a day (TID) | SUBCUTANEOUS | Status: DC
  Administered 2014-04-17: 2 [IU] via SUBCUTANEOUS
  Administered 2014-04-17 – 2014-04-18 (×2): 1 [IU] via SUBCUTANEOUS
  Administered 2014-04-18: 2 [IU] via SUBCUTANEOUS
  Administered 2014-04-18: 3 [IU] via SUBCUTANEOUS
  Administered 2014-04-19: 1 [IU] via SUBCUTANEOUS
  Administered 2014-04-19: 2 [IU] via SUBCUTANEOUS
  Administered 2014-04-20: 5 [IU] via SUBCUTANEOUS
  Administered 2014-04-20: 2 [IU] via SUBCUTANEOUS
  Administered 2014-04-21: 1 [IU] via SUBCUTANEOUS

## 2014-04-16 MED ORDER — ASPIRIN 325 MG PO TABS
325.0000 mg | ORAL_TABLET | Freq: Every day | ORAL | Status: DC
  Administered 2014-04-18 – 2014-04-19 (×2): 325 mg via ORAL
  Filled 2014-04-16 (×2): qty 1

## 2014-04-16 MED ORDER — SENNOSIDES-DOCUSATE SODIUM 8.6-50 MG PO TABS: 1.0000 | ORAL_TABLET | Freq: Every evening | ORAL | Status: DC | PRN

## 2014-04-16 MED ORDER — SODIUM CHLORIDE 0.9 % IV BOLUS (SEPSIS)
1000.0000 mL | Freq: Once | INTRAVENOUS | Status: AC
  Administered 2014-04-16: 1000 mL via INTRAVENOUS

## 2014-04-16 MED ORDER — STROKE: EARLY STAGES OF RECOVERY BOOK
Freq: Once | Status: AC
  Administered 2014-04-16: 22:00:00

## 2014-04-16 MED ORDER — INSULIN ASPART 100 UNIT/ML ~~LOC~~ SOLN
0.0000 [IU] | Freq: Every day | SUBCUTANEOUS | Status: DC
  Administered 2014-04-17: 2 [IU] via SUBCUTANEOUS

## 2014-04-16 NOTE — ED Notes (Signed)
Attempted report to 4N   

## 2014-04-16 NOTE — H&P (Signed)
[facility]                                                                                    Patient Demographics  Hayden Mckinney, is a 69 y.o. male  CSN: [id number]  MRN: [id number]  DOB - May 19, 1945  Admit Date - 04/16/2014  Outpatient Primary MD for the patient is No primary care provider on file.   With History of -  Past Medical History  Diagnosis Date  . Diabetes mellitus   . Cancer sarcoma of rt leg  . Sarcoma   . Numbness in feet     both feet  . Stroke 08/2010    TIA  . Numbness and tingling in left hand     due to stroke  . Hyperlipidemia   . Hypertension       Past Surgical History  Procedure Laterality Date  . Sarcoma removal    . Repair of r hand after trauma      in for   Chief Complaint  Patient presents with  . Aphasia     HPI  Hayden Mckinney  is a 69 y.o. male, with past medical history significant for hypertension and diabetes and TIAs in the past presenting with 2 weeks history of increasing weakness, falls and slurred speech that was noted today by his sister who was visiting him. It was difficult to get history from the patient due to his speech. Patient denies any history of chest pains shortness of breath nausea vomiting or headaches. Patient lives with a roommate at home. The patient was seen by neurology who was contemplating sending him home, however patient failed swallowing evaluation and I was called to admit    Review of Systems    In addition to the HPI above,  No Fever-chills, No Headache, No changes with Vision or hearing, No problems swallowing food or Liquids, No Chest pain, Cough or Shortness of Breath, No Abdominal pain, No Nausea or Vommitting, Bowel movements are regular, No Blood in stool or Urine, No dysuria, No new skin rashes or bruises, No new joints pains-aches,  No polyuria, polydypsia or polyphagia, No significant Mental Stressors.  A full 10 point Review of Systems was done, except as  stated above, all other Review of Systems were negative.   Social History History  Substance Use Topics  . Smoking status: Never Smoker   . Smokeless tobacco: Never Used  . Alcohol Use: 3.0 oz/week    6 drink(s) per week     Comment: 6-7 beers once a week     Family History Family History  Problem Relation Age of Onset  . Cancer Mother   . Cancer Father   . Diabetes Brother      Prior to Admission medications   Medication Sig Start Date End Date Taking? Authorizing Provider  aspirin 81 MG tablet Take 81 mg by mouth daily.    Historical Provider, MD  Blood Glucose Monitoring Suppl (RELION CONFIRM GLUCOSE MONITOR) W/DEVICE KIT Use as instructed. 12/14/11   Olga Millers, MD  lisinopril-hydrochlorothiazide (PRINZIDE) 10-12.5 MG per tablet Take 1 tablet by mouth daily. 06/08/11 06/07/12  Hans Eden, MD  metFORMIN (GLUCOPHAGE) 1000 MG tablet Take 1 tablet (1,000 mg total) by mouth 2 (two) times daily with a meal. 06/08/11 06/07/12  Hans Eden, MD  pravastatin (PRAVACHOL) 80 MG tablet Take 1 tablet (80 mg total) by mouth every evening. 06/08/11 06/07/12  Hans Eden, MD  pregabalin (LYRICA) 50 MG capsule Take 1 capsule (50 mg total) by mouth 2 (two) times daily. 11/21/11   Olga Millers, MD    No Known Allergies  Physical Exam  Vitals  Blood pressure 166/82, pulse 76, temperature 98.2 F (36.8 C), temperature source Oral, resp. rate 15, SpO2 97 %.   1. General elderly old male, well-developed, mildly malnourished  2. Normal affect and insight, Not Suicidal or Homicidal, expressive aphagia noted.  3. No F.N deficits, finger-to-nose was abnormal especially on the right  4. Ears and Eyes appear Normal, Conjunctivae clear, PERRLA. Moist Oral Mucosa.  5. Supple Neck, No JVD, No cervical lymphadenopathy appriciated, No Carotid Bruits.  6. Symmetrical Chest wall movement, Good air movement bilaterally,   7. RRR, No Gallops, Rubs or Murmurs, No Parasternal  Heave.  8. Positive Bowel Sounds, Abdomen Soft, Non tender, No organomegaly appriciated,No rebound -guarding or rigidity.  9.  No Cyanosis, Normal Skin Turgor, right leg chronic venous ulcer noted on the shin area.  10. Lower extremity weakness noted bilaterally.   Data Review  CBC  Recent Labs Lab 04/16/14 1432  WBC 9.0  HGB 13.3  HCT 40.2  PLT 287  MCV 83.8  MCH 27.7  MCHC 33.1  RDW 12.9   ------------------------------------------------------------------------------------------------------------------  Chemistries   Recent Labs Lab 04/16/14 1432  NA 139  K 4.0  CL 104  CO2 26  GLUCOSE 209*  BUN 33*  CREATININE 1.81*  CALCIUM 9.3   ------------------------------------------------------------------------------------------------------------------ CrCl cannot be calculated (Unknown ideal weight.). ------------------------------------------------------------------------------------------------------------------ No results for input(s): TSH, T4TOTAL, T3FREE, THYROIDAB in the last 72 hours.  Invalid input(s): FREET3   Coagulation profile No results for input(s): INR, PROTIME in the last 168 hours. ------------------------------------------------------------------------------------------------------------------- No results for input(s): DDIMER in the last 72 hours. -------------------------------------------------------------------------------------------------------------------  Cardiac Enzymes No results for input(s): CKMB, TROPONINI, MYOGLOBIN in the last 168 hours.  Invalid input(s): CK ------------------------------------------------------------------------------------------------------------------ Invalid input(s): POCBNP   ---------------------------------------------------------------------------------------------------------------  Urinalysis    Component Value Date/Time   COLORURINE YELLOW 08/07/2010 0848   APPEARANCEUR CLEAR 08/07/2010 0848    LABSPEC 1.026 08/07/2010 0848   PHURINE 5.0 08/07/2010 0848   GLUCOSEU >1000* 08/07/2010 0848   HGBUR NEGATIVE 08/07/2010 0848   BILIRUBINUR NEGATIVE 08/07/2010 0848   KETONESUR NEGATIVE 08/07/2010 0848   PROTEINUR NEGATIVE 08/07/2010 0848   UROBILINOGEN 0.2 08/07/2010 0848   NITRITE NEGATIVE 08/07/2010 0848   LEUKOCYTESUR NEGATIVE 08/07/2010 0848    ----------------------------------------------------------------------------------------------------------------   Imaging results:   Ct Head Wo Contrast  04/16/2014   CLINICAL DATA:  Right-sided weakness with slurred speech.  EXAM: CT HEAD WITHOUT CONTRAST  TECHNIQUE: Contiguous axial images were obtained from the base of the skull through the vertex without intravenous contrast.  COMPARISON:  CT 08/07/2010 as well as MRI 08/07/2010.  FINDINGS: Ventricles, cisterns and other CSF spaces are within normal. There is mild chronic ischemic microvascular disease. There are several small old lacune infarct over the base a ganglia bilaterally. There is no oval 1.7 cm hypodensity extending from the region of the right head of caudate nucleus to the lentiform nucleus which may be a subacute to chronic focal infarct. There is no focal mass, mass effect or shift of midline structures. There is  no evidence of acute hemorrhage. Remaining bony and soft tissue structures are within normal.  IMPRESSION: 1.7 cm focal hypodensity over the right basal ganglia/internal capsule which may be a subacute to chronic infarct. Several other smaller bilateral old basal ganglia lacunar infarcts.  Chronic ischemic microvascular disease.   Electronically Signed   By: Marin Olp M.D.   On: 04/16/2014 15:37   [name] Contrast  04/16/2014   CLINICAL DATA:  69 year old diabetic male with prior TIA now presenting with slurred speech and right-sided weakness for 2 weeks. History sarcoma. Initial encounter.  EXAM: MRI HEAD WITHOUT CONTRAST  TECHNIQUE: Multiplanar, multiecho  pulse sequences of the brain and surrounding structures were obtained without intravenous contrast.  COMPARISON:  04/16/2014 head CT.  08/07/2010 brain MR.  FINDINGS: Exam is motion degraded.  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  Tiny acute nonhemorrhagic posterior right operculum infarct.  Subacute to remote partially hemorrhagic infarct right lenticular nucleus/mid right coronal radiata.  Remote bilateral thalamic infarcts.  Remote infarct anterior left external capsule.  Partially hemorrhagic remote left caudate/anterior limb left duct internal capsule anterior left lenticular nucleus infarct.  Moderate small vessel disease type changes.  Global atrophy without hydrocephalus.  No intracranial mass lesion noted on this unenhanced exam.  Small right vertebral artery may end predominantly in a posterior inferior cerebellar artery distribution. Left vertebral artery, basilar artery and internal carotid arteries appear patent.  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  Tiny acute nonhemorrhagic posterior right operculum infarct.  Subacute to remote partially hemorrhagic infarct right lenticular nucleus/mid right coronal radiata.  Remote bilateral thalamic infarcts.  Remote infarct anterior left external capsule.  Partially hemorrhagic remote left caudate/anterior limb left duct internal capsule anterior left lenticular nucleus infarct.  Moderate small vessel disease type changes.  Small right vertebral artery may end predominantly in a posterior inferior cerebellar artery distribution. Left vertebral artery, basilar artery and internal carotid arteries appear patent.   Electronically Signed   By: Genia Del M.D.   On: 04/16/2014 19:00    My personal review of EKG: Normal sinus rhythm at 80 bpm  with bifascicular block    Assessment & Plan   1. 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 History of stroke 3. Right leg sarcoma 4. Hypertension 5. Hyperlipidemia  Plan  Admit to telemetry Increase aspirin to 162 mg by mouth daily Neuro is following May need placement  DVT Prophylaxis Lovenox  AM Labs Ordered, also please review Full Orders  Code Status full  Disposition Plan: Long-term facility  Time spent in minutes : 35 minutes  Condition GUARDED  _0 @

## 2014-04-16 NOTE — ED Notes (Signed)
Neuro at bedside.

## 2014-04-16 NOTE — Consult Note (Signed)
Stroke Consult    Chief Complaint: slurred speech and right sided weakness  HPI: Hayden Mckinney is an 69 y.o. male hx of HTN, HLD, DM, prior TIA presenting with slurred speech and right sided weakness x 2 weeks. His sister visit him today and was unable to understand him so she convinced him to come to the hospital for evaluation. He also notes his right sided weakness has worsened in the past few days. He takes an ASA 81mg  daily at home.   MRI brain completed in ED, imaging reviewed. Shows acute infarcts in mid to superior right cerebellum, and small acute infarct in right operculum. Multiple old infarcts also noted.  Date last known well: unclear > 2weeks ago Time last known well: unclear >2 weeks ago tPA Given: no, outside treatment window  Past Medical History  Diagnosis Date  . Diabetes mellitus   . Cancer sarcoma of rt leg  . Sarcoma   . Numbness in feet     both feet  . Stroke 08/2010    TIA  . Numbness and tingling in left hand     due to stroke  . Hyperlipidemia   . Hypertension     Past Surgical History  Procedure Laterality Date  . Sarcoma removal    . Repair of r hand after trauma      Family History  Problem Relation Age of Onset  . Cancer Mother   . Cancer Father   . Diabetes Brother    Social History:  reports that he has never smoked. He has never used smokeless tobacco. He reports that he drinks about 3.0 oz of alcohol per week. He reports that he does not use illicit drugs.  Allergies: No Known Allergies   (Not in a hospital admission)  ROS: Out of a complete 14 system review, the patient complains of only the following symptoms, and all other reviewed systems are negative. +weakness and slurred speech  Physical Examination: Filed Vitals:   04/16/14 1920  BP: 161/98  Pulse: 71  Temp:   Resp: 17   Physical Exam  Constitutional: He appears well-developed and well-nourished.  Psych: Affect appropriate to situation Eyes: No scleral  injection HENT: No OP obstrucion Head: Normocephalic.  Cardiovascular: Normal rate and regular rhythm.  Respiratory: Effort normal and breath sounds normal.  GI: Soft. Bowel sounds are normal. No distension. There is no tenderness.  Skin: WDI  Neurologic Examination: Mental Status: Alert, oriented, thought content appropriate.  Speech fluent without evidence of aphasia. Moderate to severe aphasia noted.  Cranial Nerves: II: funduscopic exam wnl bilaterally, visual fields grossly normal, pupils equal, round, reactive to light and accommodation III,IV, VI: ptosis not present, extra-ocular motions intact bilaterally V,VII: smile symmetric, facial light touch sensation normal bilaterally VIII: hearing normal bilaterally IX,X: gag reflex present XI: trapezius strength/neck flexion strength normal bilaterally XII: tongue strength normal  Motor: Proximal RUE 5-/5, distal 5-/5 LUE 5/5 proximal and distal RLE proximal 5-/5, distal 5/5 LLE 5/5 strength proximal and distal Tone and bulk:normal tone throughout; no atrophy noted Sensory: Pinprick and light touch intact throughout, bilaterally with exception of decreased bilateral LE PP to midshin Deep Tendon Reflexes: 2+ and symmetric throughout, absent AJs bilaterally Plantars: Right: downgoing   Left: downgoing Cerebellar: Mild dysmetria with FTN and HTS on the right side Gait: deferred  Laboratory Studies:   Basic Metabolic Panel:  Recent Labs Lab 04/16/14 1432  NA 139  K 4.0  CL 104  CO2 26  GLUCOSE 209*  BUN 33*  CREATININE 1.81*  CALCIUM 9.3    Liver Function Tests: No results for input(s): AST, ALT, ALKPHOS, BILITOT, PROT, ALBUMIN in the last 168 hours. No results for input(s): LIPASE, AMYLASE in the last 168 hours. No results for input(s): AMMONIA in the last 168 hours.  CBC:  Recent Labs Lab 04/16/14 1432  WBC 9.0  HGB 13.3  HCT 40.2  MCV 83.8  PLT 287    Cardiac Enzymes: No results for input(s):  CKTOTAL, CKMB, CKMBINDEX, TROPONINI in the last 168 hours.  BNP: Invalid input(s): POCBNP  CBG:  Recent Labs Lab 04/16/14 [place]    Microbiology: No results found for this or any previous visit.  Coagulation Studies: No results for input(s): LABPROT, INR in the last 72 hours.  Urinalysis: No results for input(s): COLORURINE, LABSPEC, PHURINE, GLUCOSEU, HGBUR, BILIRUBINUR, KETONESUR, PROTEINUR, UROBILINOGEN, NITRITE, LEUKOCYTESUR in the last 168 hours.  Invalid input(s): APPERANCEUR  Lipid Panel:     Component Value Date/Time   CHOL 263* 12/14/2011 1555   TRIG 336* 12/14/2011 1555   HDL 50 12/14/2011 1555   CHOLHDL 5.3 12/14/2011 1555   VLDL 67* 12/14/2011 1555   LDLCALC 146* 12/14/2011 1555    HgbA1C:  Lab Results  Component Value Date   HGBA1C 7.9 12/14/2011    Urine Drug Screen:  No results found for: LABOPIA, COCAINSCRNUR, LABBENZ, AMPHETMU, THCU, LABBARB  Alcohol Level: No results for input(s): ETH in the last 168 hours.  Other results: EKG: normal sinus rhythm.  Imaging: Ct Head Wo Contrast  04/16/2014   CLINICAL DATA:  Right-sided weakness with slurred speech.  EXAM: CT HEAD WITHOUT CONTRAST  TECHNIQUE: Contiguous axial images were obtained from the base of the skull through the vertex without intravenous contrast.  COMPARISON:  CT 08/07/2010 as well as MRI 08/07/2010.  FINDINGS: Ventricles, cisterns and other CSF spaces are within normal. There is mild chronic ischemic microvascular disease. There are several small old lacune infarct over the base a ganglia bilaterally. There is no oval 1.7 cm hypodensity extending from the region of the right head of caudate nucleus to the lentiform nucleus which may be a subacute to chronic focal infarct. There is no focal mass, mass effect or shift of midline structures. There is no evidence of acute hemorrhage. Remaining bony and soft tissue structures are within normal.  IMPRESSION: 1.7 cm focal hypodensity over  the right basal ganglia/internal capsule which may be a subacute to chronic infarct. Several other smaller bilateral old basal ganglia lacunar infarcts.  Chronic ischemic microvascular disease.   Electronically Signed   By: Marin Olp M.D.   On: 04/16/2014 15:37   [name] Contrast  04/16/2014   CLINICAL DATA:  69 year old diabetic male with prior TIA now presenting with slurred speech and right-sided weakness for 2 weeks. History sarcoma. Initial encounter.  EXAM: MRI HEAD WITHOUT CONTRAST  TECHNIQUE: Multiplanar, multiecho pulse sequences of the brain and surrounding structures were obtained without intravenous contrast.  COMPARISON:  04/16/2014 head CT.  08/07/2010 brain MR.  FINDINGS: Exam is motion degraded.  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  Tiny acute nonhemorrhagic posterior right operculum infarct.  Subacute to remote partially hemorrhagic infarct right lenticular nucleus/mid right coronal radiata.  Remote bilateral thalamic infarcts.  Remote infarct anterior left external capsule.  Partially hemorrhagic remote left caudate/anterior limb left duct internal capsule anterior left lenticular nucleus infarct.  Moderate small vessel disease type changes.  Global  atrophy without hydrocephalus.  No intracranial mass lesion noted on this unenhanced exam.  Small right vertebral artery may end predominantly in a posterior inferior cerebellar artery distribution. Left vertebral artery, basilar artery and internal carotid arteries appear patent.  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  Tiny acute nonhemorrhagic posterior right operculum infarct.  Subacute to remote partially hemorrhagic infarct right lenticular nucleus/mid right coronal radiata.  Remote bilateral thalamic infarcts.  Remote  infarct anterior left external capsule.  Partially hemorrhagic remote left caudate/anterior limb left duct internal capsule anterior left lenticular nucleus infarct.  Moderate small vessel disease type changes.  Small right vertebral artery may end predominantly in a posterior inferior cerebellar artery distribution. Left vertebral artery, basilar artery and internal carotid arteries appear patent.   Electronically Signed   By: Genia Del M.D.   On: 04/16/2014 19:00    Assessment: 69 y.o. male hx of HTN, HLD, DM, prior TIA presenting with 2 week history of slurred speech and right sided weakness. Noted worsening of symptoms in the past few days so presented to the ED. MRI brain confirms acute infarct. Admitted for stroke workup.   Plan: 1. HgbA1c, fasting lipid panel 2.  MRA  of the brain without contrast 3. PT consult, OT consult, Speech consult 4. Echocardiogram 5. Carotid dopplers 6. Prophylactic therapy-increase ASA to 325mg  daily 7. Risk factor modification 8. Telemetry monitoring 9. Frequent neuro checks 10. NPO until RN stroke swallow screen   Jim Like, DO [facility] [phone]  If 7pm- 7am, please page neurology on call as listed in AMION. 04/16/2014, 7:36 PM

## 2014-04-16 NOTE — ED Notes (Addendum)
Pt to ED via [facility]- pt sister called 36 after talking to pt and noticed him to have slurred speech- pt admits to having hx of a stroke in the past but is a poor historian about deficits from stroke.  LSN by sister 1 month ago.  Pt noted to have right sided hand weakness and slurred speech at present- pt alert and oriented X 4 at present.  CBG 430

## 2014-04-16 NOTE — ED Provider Notes (Signed)
CSN: [id number]     Arrival date & time 04/16/14  1402 History   First MD Initiated Contact with Patient 04/16/14 1421     Chief Complaint  Patient presents with  . Aphasia     (Consider location/radiation/quality/duration/timing/severity/associated sxs/prior Treatment) The history is provided by the patient.  Hayden Mckinney is a 69 y.o. male history of diabetes, previous TIA in 2012 here presenting with slurred speech. Patient lives at home by himself. He states that he has been having slurred speech and right-sided weakness about 2 weeks. His sister visited him today and was unable to understand his speech so called EMS. Also has worsening weakness on the right side as well.  Level V caveat- aphasia   Past Medical History  Diagnosis Date  . Diabetes mellitus   . Cancer sarcoma of rt leg  . Sarcoma   . Numbness in feet     both feet  . Stroke 08/2010    TIA  . Numbness and tingling in left hand     due to stroke  . Hyperlipidemia   . Hypertension    Past Surgical History  Procedure Laterality Date  . Sarcoma removal    . Repair of r hand after trauma     Family History  Problem Relation Age of Onset  . Cancer Mother   . Cancer Father   . Diabetes Brother    History  Substance Use Topics  . Smoking status: Never Smoker   . Smokeless tobacco: Never Used  . Alcohol Use: 3.0 oz/week    6 drink(s) per week     Comment: 6-7 beers once a week    Review of Systems  Neurological: Positive for speech difficulty and weakness.  All other systems reviewed and are negative.     Allergies  Review of patient's allergies indicates no known allergies.  Home Medications   Prior to Admission medications   Medication Sig Start Date End Date Taking? Authorizing Provider  aspirin 81 MG tablet Take 81 mg by mouth daily.    Historical Provider, MD  Blood Glucose Monitoring Suppl (RELION CONFIRM GLUCOSE MONITOR) W/DEVICE KIT Use as instructed. 12/14/11   Olga Millers, MD   lisinopril-hydrochlorothiazide (PRINZIDE) 10-12.5 MG per tablet Take 1 tablet by mouth daily. 06/08/11 06/07/12  Hans Eden, MD  metFORMIN (GLUCOPHAGE) 1000 MG tablet Take 1 tablet (1,000 mg total) by mouth 2 (two) times daily with a meal. 06/08/11 06/07/12  Hans Eden, MD  pravastatin (PRAVACHOL) 80 MG tablet Take 1 tablet (80 mg total) by mouth every evening. 06/08/11 06/07/12  Hans Eden, MD  pregabalin (LYRICA) 50 MG capsule Take 1 capsule (50 mg total) by mouth 2 (two) times daily. 11/21/11   Olga Millers, MD   BP 166/82 mmHg  Pulse 76  Temp(Src) 98.2 F (36.8 C) (Oral)  Resp 15  SpO2 97% Physical Exam  Constitutional: He is oriented to person, place, and time. He appears well-nourished.  Slurred speech   HENT:  Head: Normocephalic.  MM slightly dry   Eyes: Conjunctivae are normal. Pupils are equal, round, and reactive to light.  Neck: Normal range of motion. Neck supple.  Cardiovascular: Normal rate, regular rhythm and normal heart sounds.   Pulmonary/Chest: Effort normal and breath sounds normal. No respiratory distress. He has no wheezes. He has no rales.  Abdominal: Soft. Bowel sounds are normal. He exhibits no distension. There is no tenderness.  Musculoskeletal: Normal range of motion. He exhibits no edema or tenderness.  Neurological: He is alert and oriented to person, place, and time.  Slurred speech. No obvious facial droop. Strength 4/5 R side, 5/5 L side. Nl sensation throughout   Skin: Skin is warm and dry.  Psychiatric: He has a normal mood and affect. His behavior is normal. Judgment and thought content normal.  Nursing note and vitals reviewed.   ED Course  Procedures (including critical care time) Labs Review Labs Reviewed  BASIC METABOLIC PANEL - Abnormal; Notable for the following:    Glucose, Bld 209 (*)    BUN 33 (*)    Creatinine, Ser 1.81 (*)    GFR calc non Af Amer 36 (*)    GFR calc Af Amer 42 (*)    All other components within normal  limits  CBG MONITORING, ED - Abnormal; Notable for the following:    Glucose-Capillary 312 (*)    All other components within normal limits  CBC  URINALYSIS, ROUTINE W REFLEX MICROSCOPIC    Imaging Review Ct Head Wo Contrast  04/16/2014   CLINICAL DATA:  Right-sided weakness with slurred speech.  EXAM: CT HEAD WITHOUT CONTRAST  TECHNIQUE: Contiguous axial images were obtained from the base of the skull through the vertex without intravenous contrast.  COMPARISON:  CT 08/07/2010 as well as MRI 08/07/2010.  FINDINGS: Ventricles, cisterns and other CSF spaces are within normal. There is mild chronic ischemic microvascular disease. There are several small old lacune infarct over the base a ganglia bilaterally. There is no oval 1.7 cm hypodensity extending from the region of the right head of caudate nucleus to the lentiform nucleus which may be a subacute to chronic focal infarct. There is no focal mass, mass effect or shift of midline structures. There is no evidence of acute hemorrhage. Remaining bony and soft tissue structures are within normal.  IMPRESSION: 1.7 cm focal hypodensity over the right basal ganglia/internal capsule which may be a subacute to chronic infarct. Several other smaller bilateral old basal ganglia lacunar infarcts.  Chronic ischemic microvascular disease.   Electronically Signed   By: Marin Olp M.D.   On: 04/16/2014 15:37     EKG Interpretation   Date/Time:  Friday April 16 2014 14:06:11 EDT Ventricular Rate:  80 PR Interval:  165 QRS Duration: 137 QT Interval:  412 QTC Calculation: 475 R Axis:   -75 Text Interpretation:  Sinus rhythm RBBB and LAFB No significant change  since last tracing Confirmed by Karysa Heft  MD, Warren Kugelman ([id number]) on 04/16/2014 3:05:21  PM      MDM   Final diagnoses:  Speech abnormality   Hayden Mckinney is a 69 y.o. male here with slurred speech, R sided weakness for about 2 weeks. Will do stroke workup.   5:46 PM Discussed with Dr. Leonel Ramsay  from neuro. CT showed subacute infarct. Initially recommend MRI and likely increase ASA and outpatient workup. However, he has acute renal failure and failed swallow screen. Also has trouble ambulating. Will admit for workup and possible rehab.     Wandra Arthurs, MD 04/16/14 [phone]

## 2014-04-17 DIAGNOSIS — I6312 Cerebral infarction due to embolism of basilar artery: Secondary | ICD-10-CM

## 2014-04-17 DIAGNOSIS — I6789 Other cerebrovascular disease: Secondary | ICD-10-CM

## 2014-04-17 LAB — GLUCOSE, CAPILLARY
GLUCOSE-CAPILLARY: 124 mg/dL — AB (ref 70–99)
GLUCOSE-CAPILLARY: 226 mg/dL — AB (ref 70–99)
Glucose-Capillary: 111 mg/dL — ABNORMAL HIGH (ref 70–99)
Glucose-Capillary: 125 mg/dL — ABNORMAL HIGH (ref 70–99)
Glucose-Capillary: 130 mg/dL — ABNORMAL HIGH (ref 70–99)
Glucose-Capillary: 181 mg/dL — ABNORMAL HIGH (ref 70–99)
Glucose-Capillary: 202 mg/dL — ABNORMAL HIGH (ref 70–99)

## 2014-04-17 LAB — BASIC METABOLIC PANEL
Anion gap: 9 (ref 5–15)
BUN: 25 mg/dL — AB (ref 6–23)
CO2: 26 mmol/L (ref 19–32)
Calcium: 8.5 mg/dL (ref 8.4–10.5)
Chloride: 104 mmol/L (ref 96–112)
Creatinine, Ser: 1.47 mg/dL — ABNORMAL HIGH (ref 0.50–1.35)
GFR calc Af Amer: 54 mL/min — ABNORMAL LOW (ref >90)
GFR calc non Af Amer: 47 mL/min — ABNORMAL LOW (ref >90)
GLUCOSE: 201 mg/dL — AB (ref 70–99)
Potassium: 3.5 mmol/L (ref 3.5–5.1)
SODIUM: 139 mmol/L (ref 135–145)

## 2014-04-17 LAB — LIPID PANEL
CHOL/HDL RATIO: 6.9 ratio
CHOLESTEROL: 254 mg/dL — AB (ref 0–200)
HDL: 37 mg/dL — AB (ref >39)
LDL Cholesterol: 162 mg/dL — ABNORMAL HIGH (ref 0–99)
Triglycerides: 276 mg/dL — ABNORMAL HIGH (ref <150)
VLDL: 55 mg/dL — AB (ref 0–40)

## 2014-04-17 MED ORDER — HYDRALAZINE HCL 20 MG/ML IJ SOLN
10.0000 mg | INTRAMUSCULAR | Status: DC | PRN
  Administered 2014-04-17: 10 mg via INTRAVENOUS
  Filled 2014-04-17 (×2): qty 1

## 2014-04-17 MED ORDER — METOPROLOL TARTRATE 1 MG/ML IV SOLN
5.0000 mg | Freq: Four times a day (QID) | INTRAVENOUS | Status: DC | PRN
  Administered 2014-04-17: 5 mg via INTRAVENOUS
  Filled 2014-04-17: qty 5

## 2014-04-17 MED ORDER — ACETAMINOPHEN 325 MG PO TABS
650.0000 mg | ORAL_TABLET | Freq: Four times a day (QID) | ORAL | Status: DC | PRN
  Administered 2014-04-17: 650 mg via ORAL
  Filled 2014-04-17: qty 2

## 2014-04-17 MED ORDER — METOPROLOL TARTRATE 1 MG/ML IV SOLN
INTRAVENOUS | Status: AC
  Filled 2014-04-17: qty 5

## 2014-04-17 MED ORDER — METOPROLOL TARTRATE 1 MG/ML IV SOLN
5.0000 mg | Freq: Four times a day (QID) | INTRAVENOUS | Status: DC
  Administered 2014-04-17 – 2014-04-18 (×4): 5 mg via INTRAVENOUS
  Filled 2014-04-17 (×3): qty 5

## 2014-04-17 NOTE — Evaluation (Signed)
Clinical/Bedside Swallow Evaluation Patient Details  Name: Hayden Mckinney MRN: [id number] Date of Birth: May 07, 1945  Today's Date: 04/17/2014 Time: SLP Start Time (ACUTE ONLY): 1045 SLP Stop Time (ACUTE ONLY): 1106 SLP Time Calculation (min) (ACUTE ONLY): 21 min  Past Medical History:  Past Medical History  Diagnosis Date  . Diabetes mellitus   . Cancer sarcoma of rt leg  . Sarcoma   . Numbness in feet     both feet  . Stroke 08/2010    TIA  . Numbness and tingling in left hand     due to stroke  . Hyperlipidemia   . Hypertension    Past Surgical History:  Past Surgical History  Procedure Laterality Date  . Sarcoma removal    . Repair of r hand after trauma     HPI:  69 y.o. male, with past medical history significant for hypertension and diabetes and TIAs in the past presenting with 2 weeks history of increasing weakness, falls and slurred speech that was noted today by his sister who was visiting him. It was difficult to get history from the patient due to his speech. Patient denies any history of chest pains shortness of breath nausea vomiting or headaches. Patient lives with a roommate at home.   Assessment / Plan / Recommendation Clinical Impression  Patient presents with a mild oral dysphagia, characterized by decreased mastication and oral manipulation/transit of solid texture bolus. Patient is edentulous, however he stated that at home he can eat anything, and will cut things up if he needs to. Patient presents with moderate dysarthria, and decreased lingual ROM and strength.    Aspiration Risk  Mild    Diet Recommendation Dysphagia 3 (Mechanical Soft);Thin liquid   Liquid Administration via: Cup;Straw Medication Administration: Whole meds with liquid Supervision: Patient able to self feed;Intermittent supervision to cue for compensatory strategies Compensations: Slow rate;Small sips/bites Postural Changes and/or Swallow Maneuvers: Seated upright 90  degrees;Upright 30-60 min after meal    Other  Recommendations     Follow Up Recommendations  Home health SLP    Frequency and Duration min 2x/week  2 weeks   Pertinent Vitals/Pain     SLP Swallow Goals     Swallow Study Prior Functional Status  Type of Home: House  Lives With: Other (Comment) (roomate)    General Date of Onset: 04/16/14 HPI: 69 y.o. male, with past medical history significant for hypertension and diabetes and TIAs in the past presenting with 2 weeks history of increasing weakness, falls and slurred speech that was noted today by his sister who was visiting him. It was difficult to get history from the patient due to his speech. Patient denies any history of chest pains shortness of breath nausea vomiting or headaches. Patient lives with a roommate at home. Type of Study: Bedside swallow evaluation Previous Swallow Assessment: N/A Diet Prior to this Study: Other (Comment) (patient reports that he eats regular food, but cuts things up) Temperature Spikes Noted: No Respiratory Status: Room air History of Recent Intubation: No Behavior/Cognition: Alert;Cooperative;Pleasant mood Oral Cavity - Dentition: Edentulous Self-Feeding Abilities: Able to feed self Patient Positioning: Upright in chair Baseline Vocal Quality: Clear Volitional Cough: Strong Volitional Swallow: Able to elicit    Oral/Motor/Sensory Function Overall Oral Motor/Sensory Function: Impaired Lingual ROM: Reduced right;Reduced left   Ice Chips Ice chips: Not tested   Thin Liquid Thin Liquid: Within functional limits Presentation: Cup;Self Fed;Straw    Nectar Thick Nectar Thick Liquid: Not tested   Honey Thick  Honey Thick Liquid: Not tested   Puree Puree: Within functional limits Presentation: Self Fed;Spoon   Solid   GO    Solid: Impaired Oral Phase Impairments: Impaired mastication;Reduced lingual movement/coordination       Hayden Mckinney 04/17/2014,5:35 PM    Sonia Baller, MA, CCC-SLP 04/17/2014 5:35 PM

## 2014-04-17 NOTE — Progress Notes (Addendum)
[facility] PROGRESS NOTE  STEPEN TANDE [id number] DOB: August 23, 1945 DOA: 04/16/2014 PCP: No primary care provider on file.  Brief Summary  Hayden Mckinney is a 69 y.o. male, with history hypertension, diabetes, and TIAs who presented with 2 weeks history of increasing weakness, falls and slurred speech.  His sister noticed his slurred speech and advised him to come to the hospital.  MRI demonstrated a moderate sized acute to subacute infarct in the mid to superior right cerebellum with local mass effect. He also had tiny acute posterior right operculum infarct, subacute partially hemorrhagic infarct of the right lenticular nucleus and right mid coronal radiata.  He also had evidence of remote bilateral infarcts, which were partially hemorrhagic.  He failed his swallow evaluation and has been admitted for further workup of his strokes and therapy.  Assessment/Plan  Acute to subacute infarcts with persistent dysarthria and dysphagia -  MRA brain -  Echo -  Carotid duplex -  PT/OT/speech therapy -  Continue aspirin, change to by mouth when able -  Telemetry: Normal sinus rhythm, no arrhythmias -  Hemoglobin A1c and lipid panel pending  Dysphagia -  Swallow evaluation -  If he fails the swallow test he will need a PEG tube  Hypertension, blood pressures elevated -  Unable to take by mouth medications -  Continue prn metoprolol until swallow evaluation  Hyperlipidemia -  Follow-up lipid panel -  Change to high-dose statin once tolerating PO or PEG feeds  Diabetes mellitus, type II with strokes -  A1c -  Low-dose sliding scale insulin -  Hold metformin  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    Objective: Filed Vitals:   04/17/14 0030 04/17/14 0230 04/17/14 0430 04/17/14 0630  BP: 166/79 187/90 157/97 182/80  Pulse: 62 63 66 64  Temp: 98.4 F (36.9 C) 98.2 F (36.8 C) 97.9 F (36.6 C) 98.4 F (36.9 C)  TempSrc: Oral Oral Oral Oral  Resp: 18 18 18 18   Height:      Weight:      SpO2: 100% 97% 97% 99%   No intake or output data in the 24 hours ending 04/17/14 0752 Filed Weights   04/16/14 2054  Weight: 67.1 kg (147 lb 14.9 oz)    Exam:   General:  Adult male, No acute distress,   HEENT:  NCAT, MMM  Cardiovascular:  RRR, nl S1, S2 no mrg, 2+ pulses, warm extremities  Respiratory:  CTAB, no increased WOB  Abdomen:   NABS, soft, NT/ND  MSK:   Decreased tone and bulk of RLE with some abrasions on knee and shin, no LEE  Neuro:  Dysarthria, no facial droop.  Tongue and palate appear symmetric.  Bilateral upper extremities 5-/5, mild dysmetria on the left, foot drop on the right, otherwise strength 5-/5, symmetric 2+ reflexes.     Data Reviewed: Basic Metabolic Panel:  Recent Labs Lab 04/16/14 1432  NA 139  K 4.0  CL 104  CO2 26  GLUCOSE 209*  BUN 33*  CREATININE 1.81*  CALCIUM 9.3   Liver Function Tests: No results for input(s): AST, ALT, ALKPHOS, BILITOT, PROT, ALBUMIN in the last 168 hours. No results for input(s): LIPASE, AMYLASE in the last 168 hours. No results for input(s): AMMONIA in the last 168 hours. CBC:  Recent Labs Lab 04/16/14 1432  WBC 9.0  HGB 13.3  HCT 40.2  MCV 83.8  PLT 287   Cardiac Enzymes: No results for input(s): CKTOTAL, CKMB, CKMBINDEX, TROPONINI in the last 168 hours. BNP (last 3 results) No results for input(s): BNP in the last 8760 hours.  ProBNP  (last 3 results) No results for input(s): PROBNP in the last 8760 hours.  CBG:  Recent Labs Lab 04/16/14 1419 04/17/14 0029 04/17/14 0403 04/17/14 0735  GLUCAP 312* 130* 125* 111*    No results found for this or any previous visit (from the past 240 hour(s)).   Studies: Ct Head Wo Contrast  04/16/2014   CLINICAL DATA:  Right-sided weakness with slurred speech.  EXAM: CT HEAD WITHOUT CONTRAST  TECHNIQUE: Contiguous axial images were obtained from the base of the skull through the vertex without intravenous contrast.  COMPARISON:  CT 08/07/2010 as well as MRI 08/07/2010.  FINDINGS: Ventricles, cisterns and other CSF spaces are within normal. There is mild chronic ischemic microvascular disease. There are several small old lacune infarct over the base a ganglia bilaterally. There is no oval 1.7 cm hypodensity extending from the region of the right head of caudate nucleus to the lentiform nucleus which may be a subacute to chronic focal infarct. There is no focal mass, mass effect or shift of midline structures. There is no evidence of acute hemorrhage. Remaining bony and soft tissue structures are within normal.  IMPRESSION: 1.7 cm focal hypodensity over the right basal ganglia/internal capsule which may be a subacute to chronic infarct. Several other smaller bilateral old basal ganglia lacunar infarcts.  Chronic ischemic microvascular disease.   Electronically Signed   By: Marin Olp M.D.   On: 04/16/2014 15:37   [name] Contrast  04/16/2014   CLINICAL DATA:  69 year old diabetic male with prior TIA now presenting with slurred speech and right-sided weakness for 2 weeks. History sarcoma. Initial encounter.  EXAM: MRI HEAD WITHOUT CONTRAST  TECHNIQUE: Multiplanar, multiecho pulse sequences of the brain and surrounding structures were obtained without intravenous contrast.  COMPARISON:  04/16/2014 head CT.  08/07/2010 brain MR.  FINDINGS: Exam is motion degraded.  Moderate-size acute/ subacute  nonhemorrhagic infarct mid to superior right cerebellar with local mass effect.  Tiny acute nonhemorrhagic posterior right operculum infarct.  Subacute to remote partially hemorrhagic infarct right lenticular nucleus/mid right coronal radiata.  Remote bilateral thalamic infarcts.  Remote infarct anterior left external capsule.  Partially hemorrhagic remote left caudate/anterior limb left duct internal capsule anterior left lenticular nucleus infarct.  Moderate small vessel disease type changes.  Global atrophy without hydrocephalus.  No intracranial mass lesion noted on this unenhanced exam.  Small right vertebral artery may end predominantly in a posterior inferior cerebellar artery distribution. Left vertebral artery, basilar artery and internal carotid arteries appear patent.  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  Tiny acute nonhemorrhagic posterior right operculum infarct.  Subacute to remote partially hemorrhagic infarct right lenticular nucleus/mid right coronal radiata.  Remote bilateral thalamic infarcts.  Remote infarct anterior left external capsule.  Partially hemorrhagic remote left caudate/anterior limb left duct internal capsule anterior left lenticular nucleus infarct.  Moderate small vessel disease type changes.  Small right vertebral artery may end predominantly in a posterior inferior cerebellar artery distribution. Left vertebral artery, basilar artery and internal carotid arteries appear patent.   Electronically Signed   By: Genia Del M.D.   On: 04/16/2014 19:00    Scheduled Meds: . aspirin  300 mg Rectal Daily   Or  . aspirin  325 mg Oral Daily  . enoxaparin (LOVENOX) injection  40 mg Subcutaneous Q24H  . insulin aspart  0-5 Units Subcutaneous QHS  . insulin aspart  0-9 Units Subcutaneous TID WC   Continuous Infusions: . sodium  chloride 50 mL/hr at 04/16/14 2146    Active Problems:   Stroke   CVA (cerebral infarction)    Time spent: 30 min    Hibba Schram, [facility] Pager [phone]. If 7PM-7AM, please contact night-coverage at [email or web address], password [email or web address] 04/17/2014, 7:52 AM  LOS: 1 day

## 2014-04-17 NOTE — Progress Notes (Signed)
Pt arrived to unit via stretcher from the ED.  Pt with no signs of distress.  Vitals and assessment stable.  Pt oriented to unit, room and plan of care.  Tele applied and central monitoring notified.  Bed low and call bell within reach.  Will continue to monitor.

## 2014-04-17 NOTE — Progress Notes (Signed)
INITIAL NUTRITION ASSESSMENT Pt meets criteria for MODERATE MALNUTRITION in the context of ACUTE ILLNESS as evidenced by loss of >2% bw in 1 week and mild muscle loss. DOCUMENTATION CODES Per approved criteria  -Non-severe (moderate) malnutrition in the context of acute illness or injury   INTERVENTION: -Magic cup TID with meals, each supplement provides 290 kcal and 9 grams of protein  NUTRITION DIAGNOSIS: Unintended weight loss related to weakness/TIA as evidenced by reported loss of 10 pounds in 2 weeks.   Goal: Pt to meet >/= 90% of their estimated nutrition needs   Monitor:  Oral intake, Speech swallow eval results, labs, weight  Reason for Assessment: MST 3  69 y.o. male  Admitting Dx: <principal problem not specified>  ASSESSMENT: 69 y.o. male, PMHx significant for hypertension and diabetes and TIAs in the past presenting with 2 weeks history of increasing weakness, falls and slurred speech   Pt was difficult to understand. He reports that he has lost about ~10 pounds or so in the last 2 weeks. He says he has been eating well though. He reports eating 5-6 smaller meals a day. He denies taking any vitamins/minerals at home.   Nutrition Focused Physical Exam:  Subcutaneous Fat:  Orbital Region: well nourished Upper Arm Region: n/a Thoracic and Lumbar Region: n/a  Muscle:  Temple Region: well nourished Clavicle Bone Region: mild loss Clavicle and Acromion Bone Region: mild muscle loss Scapular Bone Region: n/a Dorsal Hand: well nourished Patellar Region: well nourished Anterior Thigh Region: well nourished Posterior Calf Region: mild loss  Edema: none noted   Height: Ht Readings from Last 1 Encounters:  04/16/14 5\' 10"  (1.778 m)    Weight: Wt Readings from Last 1 Encounters:  04/16/14 147 lb 14.9 oz (67.1 kg)    Ideal Body Weight: 166 Lbs  % Ideal Body Weight: 89%  Wt Readings from Last 10 Encounters:  04/16/14 147 lb 14.9 oz (67.1 kg)  12/14/11  168 lb 14.4 oz (76.613 kg)  06/26/11 166 lb 3.2 oz (75.388 kg)  06/08/11 168 lb (76.204 kg)  12/15/10 164 lb (74.39 kg)  11/23/10 165 lb (74.844 kg)  09/20/10 162 lb 4.8 oz (73.619 kg)  08/25/10 160 lb 12.8 oz (72.938 kg)    Usual Body Weight: 170 lbs   % Usual Body Weight: 86%  BMI:  Body mass index is 21.23 kg/(m^2).  Estimated Nutritional Needs: Kcal: 1900-2100 kcal/kg (28-31 kcal/kg) Protein: 81-94 )1.2-1.4 g/kg) Fluid: 1.9-2.1 liters  Skin: Dry, abrasion on right leg  Diet Order: Diet NPO time specified  EDUCATION NEEDS: -No education needs identified at this time  No intake or output data in the 24 hours ending 04/17/14 0908  Last BM: 04/15/14   Labs:   Recent Labs Lab 04/16/14 1432  NA 139  K 4.0  CL 104  CO2 26  BUN 33*  CREATININE 1.81*  CALCIUM 9.3  GLUCOSE 209*    CBG (last 3)   Recent Labs  04/17/14 0029 04/17/14 0403 04/17/14 0735  GLUCAP 130* 125* 111*    Scheduled Meds: . aspirin  300 mg Rectal Daily   Or  . aspirin  325 mg Oral Daily  . enoxaparin (LOVENOX) injection  40 mg Subcutaneous Q24H  . insulin aspart  0-5 Units Subcutaneous QHS  . insulin aspart  0-9 Units Subcutaneous TID WC    Continuous Infusions: . sodium chloride 50 mL/hr at 04/16/14 2146    Past Medical History  Diagnosis Date  . Diabetes mellitus   .  Cancer sarcoma of rt leg  . Sarcoma   . Numbness in feet     both feet  . Stroke 08/2010    TIA  . Numbness and tingling in left hand     due to stroke  . Hyperlipidemia   . Hypertension     Past Surgical History  Procedure Laterality Date  . Sarcoma removal    . Repair of r hand after trauma      Burtis Junes RD, LDN Nutrition Pager: [phone] 04/17/2014 9:08 AM

## 2014-04-17 NOTE — Progress Notes (Signed)
STROKE TEAM PROGRESS NOTE   HISTORY Hayden Mckinney is a 69 y.o. male hx of HTN, HLD, DM, prior TIA presenting with slurred speech and right sided weakness x 2 weeks. His sister visited him today and was unable to understand him so she convinced him to come to the hospital for evaluation. He also notes his right sided weakness has worsened in the past few days. He takes an ASA 81mg  daily at home.   MRI brain completed in ED, imaging reviewed. Shows acute infarcts in mid to superior right cerebellum, and small acute infarct in right operculum. Multiple old infarcts also noted.  Date last known well: unclear > 2weeks ago Time last known well: unclear >2 weeks ago tPA Given: no, outside treatment window    SUBJECTIVE (INTERVAL HISTORY) The patient voices no complaints except he would like something to eat. Still NPO until swallowing evaluated. He is very dysarthric.   OBJECTIVE Temp:  [97.9 F (36.6 C)-98.8 F (37.1 C)] 98.4 F (36.9 C) (04/09 0630) Pulse Rate:  [62-78] 63 (04/09 0758) Cardiac Rhythm:  [-]  Resp:  [12-20] 16 (04/09 0758) BP: (151-191)/(75-98) 169/83 mmHg (04/09 0758) SpO2:  [95 %-100 %] 97 % (04/09 0758) Weight:  [67.1 kg (147 lb 14.9 oz)] 67.1 kg (147 lb 14.9 oz) (04/08 2054)   Recent Labs Lab 04/16/14 1419 04/17/14 0029 04/17/14 0403 04/17/14 0735  GLUCAP 312* 130* 125* 111*    Recent Labs Lab 04/16/14 1432  NA 139  K 4.0  CL 104  CO2 26  GLUCOSE 209*  BUN 33*  CREATININE 1.81*  CALCIUM 9.3   No results for input(s): AST, ALT, ALKPHOS, BILITOT, PROT, ALBUMIN in the last 168 hours.  Recent Labs Lab 04/16/14 1432  WBC 9.0  HGB 13.3  HCT 40.2  MCV 83.8  PLT 287   No results for input(s): CKTOTAL, CKMB, CKMBINDEX, TROPONINI in the last 168 hours. No results for input(s): LABPROT, INR in the last 72 hours.  Recent Labs  04/16/14 2000  COLORURINE YELLOW  LABSPEC 1.020  PHURINE 5.0  GLUCOSEU 100*  HGBUR MODERATE*  BILIRUBINUR  NEGATIVE  KETONESUR 15*  PROTEINUR >300*  UROBILINOGEN 1.0  NITRITE NEGATIVE  LEUKOCYTESUR NEGATIVE       Component Value Date/Time   CHOL 263* 12/14/2011 1555   TRIG 336* 12/14/2011 1555   HDL 50 12/14/2011 1555   CHOLHDL 5.3 12/14/2011 1555   VLDL 67* 12/14/2011 1555   LDLCALC 146* 12/14/2011 1555   Lab Results  Component Value Date   HGBA1C 7.9 12/14/2011   No results found for: LABOPIA, COCAINSCRNUR, LABBENZ, AMPHETMU, THCU, LABBARB  No results for input(s): ETH in the last 168 hours.  Ct Head Wo Contrast 04/16/2014    1.7 cm focal hypodensity over the right basal ganglia/internal capsule which may be a subacute to chronic infarct. Several other smaller bilateral old basal ganglia lacunar infarcts.  Chronic ischemic microvascular disease.        [name] Contrast 04/16/2014    Moderate-size acute/ subacute nonhemorrhagic infarct mid to superior right cerebellar with local mass effect.   Tiny acute nonhemorrhagic posterior right operculum infarct.   Subacute to remote partially hemorrhagic infarct right lenticular nucleus/mid right coronal radiata.   Remote bilateral thalamic infarcts.   Remote infarct anterior left external capsule.   Partially hemorrhagic remote left caudate/anterior limb left duct internal capsule anterior left lenticular nucleus infarct.   Moderate small vessel disease type changes.  Small right vertebral artery may end predominantly  in a posterior inferior cerebellar artery distribution. Left vertebral artery, basilar artery and internal carotid arteries appear patent.       PHYSICAL EXAM General - disheveled appearing 69 year old male in no acute distress. Heart - Regular rate and rhythm  Lungs - Clear to auscultation Extremities - N edema Skin - Warm and dry  NEUROLOGIC:   MENTAL STATUS: awake, alert, oriented except year "2015". Follows all commands. Very dysarthric CRANIAL NERVES: pupils equal and reactive to light,extraocular muscles  intact, facial sensation and strength symmetric MOTOR:  Strength - 4/4 throughout except right grip 3/5 SENSORY: normal and symmetric to light touch  COORDINATION: finger-nose-finger with dysmetria bilaterally R>L REFLEXES: deep tendon reflexes present and symmetric - no babinski    ASSESSMENT/PLAN Mr. RANDEEP KRAFFT is a 69 y.o. male with history of hypertension, hyperlipidemia, diabetes mellitus, cancer, previous strokes and TIAs presenting with slurred speech and right hemiparesis.  He did not receive IV t-PA due to late presentation.  Strokes:  Bilateral - probably embolic - unknown source.   Resultant  Dysarthria - mild right hemiparesis  MRI  as above  MRA  not performed  Carotid Doppler  pending  2D Echo pending  LDL pending  HgbA1c pending  Lovenox for VTE prophylaxis Diet NPO time specified  aspirin 81 mg orally every day prior to admission, now on aspirin 300 mg suppository daily  Ongoing aggressive stroke risk factor management  Therapy recommendations:  Pending  Disposition:  Pending  Hypertension  Home meds: Lisinopril/hydrochlorothiazide Permissive hypertension <220/120 for 24-48 hours and then gradually normalize within 5-7 days   Hyperlipidemia  Home meds:  Pravachol 80 mg daily not resumed in hospital - NPO  LDL pending, goal < 70  Resume Pravachol when swallowing has been cleared.  Continue statin at discharge  Diabetes  HgbA1c pending, goal < 7.0  Controlled  Other Stroke Risk Factors  Advanced age  ETOH use  Hx stroke/TIA   Other Active Problems  Renal insufficiency BUN - 33 ; creatinine 1.81  PLAN  May need TEE and loop recorder, will plan on getting this set up if the echocardiogram does not show obvious source of stroke. Stroke event was likely embolic.  Hospital day # 1  Mikey Bussing [facility] Pager [phone] 04/17/2014, 9:09 AM  Lenor Coffin [phone]   To contact Stroke  Continuity provider, please refer to [email or web address] After hours, contact General Neurology

## 2014-04-17 NOTE — Progress Notes (Signed)
  Echocardiogram 2D Echocardiogram has been performed.  Hayden Mckinney 04/17/2014, 9:57 AM

## 2014-04-17 NOTE — Evaluation (Addendum)
Physical Therapy Evaluation Patient Details Name: Hayden Mckinney MRN: [id number] DOB: 1945/11/09 Today's Date: 04/17/2014   History of Present Illness  69 y.o. male hx of HTN, HLD, DM, prior TIA presenting with slurred speech and right sided weakness x 2 weeks. He also notes his right sided weakness has worsened in the past few days. MRI revealed acute infarcts in mid to superior right cerebellum, and small acute infarct in right operculum. Multiple old infarcts also noted  Clinical Impression  Patient demonstrates deficits in functional mobility as indicated below. Will need continued skilled PT to address deficits and maximize function. Will see as indicated and progress as tolerated. Recommend CIR for stroke recovery and return to independence.    Follow Up Recommendations CIR    Equipment Recommendations  Other (comment) (tbd)    Recommendations for Other Services Rehab consult     Precautions / Restrictions Precautions Precautions: Fall      Mobility  Bed Mobility Overal bed mobility: Needs Assistance Bed Mobility: Supine to Sit     Supine to sit: Min assist     General bed mobility comments: HOB elevated, reliance on rail, increased time and assist for trunk positioning  Transfers Overall transfer level: Needs assistance Equipment used: Rolling walker (2 wheeled) Transfers: Sit to/from Stand Sit to Stand: Min assist         General transfer comment: Min assist for stability to power up to standing  Ambulation/Gait Ambulation/Gait assistance: Mod assist;+2 physical assistance Ambulation Distance (Feet): 50 Feet Assistive device: Rolling walker (2 wheeled) Gait Pattern/deviations: Step-to pattern;Decreased stride length;Ataxic;Decreased dorsiflexion - right;Narrow base of support;Scissoring Gait velocity: decreased Gait velocity interpretation: Below normal speed for age/gender General Gait Details: Patient very ataxic with gait, increased instability and  multiple LOB requiring physical assist (+2) as well as needing assist to maintain RW on the ground secondary to coordination deficits  Stairs            Wheelchair Mobility    Modified Rankin (Stroke Patients Only) Modified Rankin (Stroke Patients Only) Pre-Morbid Rankin Score: Moderate disability Modified Rankin: Moderately severe disability     Balance Overall balance assessment: History of Falls                                           Pertinent Vitals/Pain Pain Assessment: No/denies pain    Home Living Family/patient expects to be discharged to:: Inpatient rehab Living Arrangements: Non-relatives/Friends                    Prior Function Level of Independence: Independent               Hand Dominance   Dominant Hand: Right    Extremity/Trunk Assessment   Upper Extremity Assessment: Defer to OT evaluation           Lower Extremity Assessment: RLE deficits/detail RLE Deficits / Details: weakness noted, distal motions weaker then proximal. 3+/5, (1/5 dorsiflexion)       Communication   Communication:  (dysarthric speech)  Cognition Arousal/Alertness: Awake/alert Behavior During Therapy: WFL for tasks assessed/performed Overall Cognitive Status: Within Functional Limits for tasks assessed                      General Comments General comments (skin integrity, edema, etc.): patient with pronounced RLE drop foot, unable to clear foot through stride despite cues  Exercises        Assessment/Plan    PT Assessment Patient needs continued PT services  PT Diagnosis Difficulty walking;Abnormality of gait   PT Problem List Decreased strength;Decreased activity tolerance;Decreased balance;Decreased mobility;Decreased coordination  PT Treatment Interventions DME instruction;Gait training;Stair training;Functional mobility training;Therapeutic activities;Therapeutic exercise;Balance training;Patient/family  education   PT Goals (Current goals can be found in the Care Plan section) Acute Rehab PT Goals Patient Stated Goal: to be able to eat PT Goal Formulation: With patient Time For Goal Achievement: 05/01/14 Potential to Achieve Goals: Good    Frequency Min 4X/week   Barriers to discharge Decreased caregiver support      Co-evaluation               End of Session Equipment Utilized During Treatment: Gait belt Activity Tolerance: Patient tolerated treatment well Patient left: in chair;with call bell/phone within reach;with chair alarm set (SLP entering room) Nurse Communication: Mobility status         Time: [id number] PT Time Calculation (min) (ACUTE ONLY): 17 min   Charges:   PT Evaluation $Initial PT Evaluation Tier I: 1 Procedure     PT G CodesDuncan Dull 05-08-14, 1:15 PM Alben Deeds, [place] DPT  [phone]

## 2014-04-18 DIAGNOSIS — R479 Unspecified speech disturbances: Secondary | ICD-10-CM | POA: Insufficient documentation

## 2014-04-18 LAB — BASIC METABOLIC PANEL
ANION GAP: 7 (ref 5–15)
BUN: 21 mg/dL (ref 6–23)
CO2: 26 mmol/L (ref 19–32)
Calcium: 8.8 mg/dL (ref 8.4–10.5)
Chloride: 107 mmol/L (ref 96–112)
Creatinine, Ser: 1.19 mg/dL (ref 0.50–1.35)
GFR calc Af Amer: 70 mL/min — ABNORMAL LOW (ref >90)
GFR, EST NON AFRICAN AMERICAN: 61 mL/min — AB (ref >90)
Glucose, Bld: 150 mg/dL — ABNORMAL HIGH (ref 70–99)
Potassium: 3.5 mmol/L (ref 3.5–5.1)
SODIUM: 140 mmol/L (ref 135–145)

## 2014-04-18 LAB — GLUCOSE, CAPILLARY
GLUCOSE-CAPILLARY: 150 mg/dL — AB (ref 70–99)
GLUCOSE-CAPILLARY: 217 mg/dL — AB (ref 70–99)
GLUCOSE-CAPILLARY: 182 mg/dL — AB (ref 70–99)
Glucose-Capillary: 163 mg/dL — ABNORMAL HIGH (ref 70–99)

## 2014-04-18 MED ORDER — ISOSORB DINITRATE-HYDRALAZINE 20-37.5 MG PO TABS
1.0000 | ORAL_TABLET | Freq: Three times a day (TID) | ORAL | Status: DC
  Administered 2014-04-18 – 2014-04-21 (×9): 1 via ORAL
  Filled 2014-04-18 (×12): qty 1

## 2014-04-18 MED ORDER — CARVEDILOL 6.25 MG PO TABS
6.2500 mg | ORAL_TABLET | Freq: Two times a day (BID) | ORAL | Status: DC
  Administered 2014-04-18 – 2014-04-20 (×6): 6.25 mg via ORAL
  Filled 2014-04-18 (×7): qty 1

## 2014-04-18 MED ORDER — ATORVASTATIN CALCIUM 40 MG PO TABS
40.0000 mg | ORAL_TABLET | Freq: Every day | ORAL | Status: DC
  Administered 2014-04-18 – 2014-04-20 (×3): 40 mg via ORAL
  Filled 2014-04-18 (×3): qty 1

## 2014-04-18 NOTE — Evaluation (Signed)
Occupational Therapy Evaluation Patient Details Name: Hayden Mckinney MRN: [id number] DOB: [birth year] Today's Date: 04/18/2014    History of Present Illness 69 y.o. male hx of HTN, HLD, DM, prior TIA presenting with slurred speech and right sided weakness x 2 weeks. He also notes his right sided weakness has worsened in the past few days. MRI revealed acute infarcts in mid to superior right cerebellum, and small acute infarct in right operculum. Multiple old infarcts also noted   Clinical Impression   Pt reports being unsteady with ambulation prior to admission and using furniture for support when walking.  Pt was able to perform self care independently, prepare a simple meal, and assist with housekeeping.  Pt presents with decreased awareness of safety, slurred speech, ataxia with transfer, poor balance, and generalized weakness of UEs particularly R hand. Will require post acute rehab.  Will follow acutely.    Follow Up Recommendations  CIR    Equipment Recommendations  Other (comment) (TBD)    Recommendations for Other Services       Precautions / Restrictions Precautions Precautions: Fall Restrictions Weight Bearing Restrictions: No      Mobility Bed Mobility Overal bed mobility: Needs Assistance Bed Mobility: Supine to Sit     Supine to sit: Supervision;HOB elevated (use of rail)        Transfers Overall transfer level: Needs assistance Equipment used: 1 person hand held assist Transfers: Sit to/from [facility] Sit to Stand: Min assist Stand pivot transfers: Min assist       General transfer comment: min assist to rise and for balance, ataxic quality to movement    Balance Overall balance assessment: Needs assistance   Sitting balance-Leahy Scale: Fair       Standing balance-Leahy Scale: Poor Standing balance comment: unable to stand without holding on to therapist or rail                            ADL Overall ADL's :  Needs assistance/impaired Eating/Feeding: Set up;Sitting   Grooming: Wash/dry hands;Wash/dry face;Brushing hair;Set up;Sitting   Upper Body Bathing: Minimal assitance;Sitting   Lower Body Bathing: Moderate assistance;Sit to/from stand   Upper Body Dressing : Minimal assistance;Sitting   Lower Body Dressing: Moderate assistance;Sit to/from stand   Toilet Transfer: Minimal Scientist, forensic Details (indicate cue type and reason): simulated bed to chair Toileting- Clothing Manipulation and Hygiene: Maximal assistance;Sit to/from stand         General ADL Comments: Pt able to reach down to socks to pull them up, but not start them over his toes.     Vision Additional Comments: pt denies visual changes   Perception     Praxis      Pertinent Vitals/Pain Pain Assessment: No/denies pain     Hand Dominance Right   Extremity/Trunk Assessment Upper Extremity Assessment Upper Extremity Assessment: RUE deficits/detail;LUE deficits/detail RUE Deficits / Details: 4/5 except gross grip 3/5, long standing joint deformity first finger DIP LUE Deficits / Details: 4/5 strength, slower movement compared to R   Lower Extremity Assessment Lower Extremity Assessment: Defer to PT evaluation       Communication Communication Communication: Expressive difficulties   Cognition Arousal/Alertness: Awake/alert Behavior During Therapy: WFL for tasks assessed/performed Overall Cognitive Status: Impaired/Different from baseline Area of Impairment: Safety/judgement         Safety/Judgement: Decreased awareness of deficits;Decreased awareness of safety         General Comments  Exercises       Shoulder Instructions      Home Living Family/patient expects to be discharged to:: Inpatient rehab Living Arrangements: Non-relatives/Friends   Type of Home: House                              Lives With: Other (Comment) (roommate)    Prior  Functioning/Environment Level of Independence: Independent        Comments: ambulated holding furniture, independent with self care, assist to get groceries, could prepare a simple meal, shared housekeeping with roommate    OT Diagnosis: Generalized weakness;Cognitive deficits   OT Problem List: Decreased strength;Decreased activity tolerance;Impaired balance (sitting and/or standing);Decreased coordination;Decreased cognition;Decreased safety awareness;Decreased knowledge of use of DME or AE;Impaired tone   OT Treatment/Interventions: Self-care/ADL training;Neuromuscular education;DME and/or AE instruction;Therapeutic activities;Cognitive remediation/compensation;Patient/family education;Balance training    OT Goals(Current goals can be found in the care plan section) Acute Rehab OT Goals Patient Stated Goal: walk OT Goal Formulation: With patient Time For Goal Achievement: 05/02/14 Potential to Achieve Goals: Good  OT Frequency: Min 3X/week   Barriers to D/C:            Co-evaluation              End of Session Equipment Utilized During Treatment: Gait belt  Activity Tolerance: Patient tolerated treatment well Patient left: in chair;with call bell/phone within reach;with chair alarm set   Time: 1325-1400 OT Time Calculation (min): 35 min Charges:  OT General Charges $OT Visit: 1 Procedure OT Evaluation $Initial OT Evaluation Tier I: 1 Procedure OT Treatments $Self Care/Home Management : 8-22 mins G-Codes:    Malka So 04/18/2014, 2:01 PM  [phone]

## 2014-04-18 NOTE — Progress Notes (Signed)
    [facility] has been requested to perform a transesophageal echocardiogram on Kerry Kass for acute CVA.  After careful review of history and examination, the risks and benefits of transesophageal echocardiogram have been explained including risks of esophageal damage, perforation (1:10,000 risk), bleeding, pharyngeal hematoma as well as other potential complications associated with conscious sedation including aspiration, arrhythmia, respiratory failure and death. Alternatives to treatment were discussed, questions were answered. Patient is willing to proceed.   Richardson Dopp, PA-C  04/18/2014 2:12 PM

## 2014-04-18 NOTE — Progress Notes (Signed)
Utilization review completed.  

## 2014-04-18 NOTE — Progress Notes (Signed)
Bilateral carotid artery duplex completed:  1-39% ICA stenosis.  Vertebral artery flow is antegrade.     

## 2014-04-18 NOTE — Progress Notes (Signed)
STROKE TEAM PROGRESS NOTE   HISTORY Hayden Mckinney is a 69 y.o. male hx of HTN, HLD, DM, prior TIA presenting with slurred speech and right sided weakness x 2 weeks. His sister visited him today and was unable to understand him so she convinced him to come to the hospital for evaluation. He also notes his right sided weakness has worsened in the past few days. He takes an ASA 81mg  daily at home.   MRI brain completed in ED, imaging reviewed. Shows acute infarcts in mid to superior right cerebellum, and small acute infarct in right operculum. Multiple old infarcts also noted.  Date last known well: unclear > 2weeks ago Time last known well: unclear >2 weeks ago tPA Given: no, outside treatment window    SUBJECTIVE (INTERVAL HISTORY) No family members present. The patient does not feel that he has improved. He has been cleared for a diet. We briefly discussed plans for a TEE and loop on Monday. He is in agreement to proceed.   OBJECTIVE Temp:  [98.1 F (36.7 C)-98.4 F (36.9 C)] 98.3 F (36.8 C) (04/10 0914) Pulse Rate:  [58-100] 64 (04/10 0914) Cardiac Rhythm:  [-] Normal sinus rhythm (04/09 2001) Resp:  [16-20] 20 (04/10 0914) BP: (141-185)/(60-91) 141/71 mmHg (04/10 0914) SpO2:  [97 %-100 %] 99 % (04/10 0914)   Recent Labs Lab 04/17/14 1608 04/17/14 1843 04/17/14 2203 04/18/14 0647 04/18/14 1106  GLUCAP 181* 202* 226* 163* 150*    Recent Labs Lab 04/16/14 1432 04/17/14 1543 04/18/14 0619  NA 139 139 140  K 4.0 3.5 3.5  CL 104 104 107  CO2 26 26 26   GLUCOSE 209* 201* 150*  BUN 33* 25* 21  CREATININE 1.81* 1.47* 1.19  CALCIUM 9.3 8.5 8.8   No results for input(s): AST, ALT, ALKPHOS, BILITOT, PROT, ALBUMIN in the last 168 hours.  Recent Labs Lab 04/16/14 1432  WBC 9.0  HGB 13.3  HCT 40.2  MCV 83.8  PLT 287   No results for input(s): CKTOTAL, CKMB, CKMBINDEX, TROPONINI in the last 168 hours. No results for input(s): LABPROT, INR in the last 72  hours.  Recent Labs  04/16/14 2000  COLORURINE YELLOW  LABSPEC 1.020  PHURINE 5.0  GLUCOSEU 100*  HGBUR MODERATE*  BILIRUBINUR NEGATIVE  KETONESUR 15*  PROTEINUR >300*  UROBILINOGEN 1.0  NITRITE NEGATIVE  LEUKOCYTESUR NEGATIVE       Component Value Date/Time   CHOL 254* 04/17/2014 1543   TRIG 276* 04/17/2014 1543   HDL 37* 04/17/2014 1543   CHOLHDL 6.9 04/17/2014 1543   VLDL 55* 04/17/2014 1543   LDLCALC 162* 04/17/2014 1543   Lab Results  Component Value Date   HGBA1C 7.9 12/14/2011   No results found for: LABOPIA, COCAINSCRNUR, LABBENZ, AMPHETMU, THCU, LABBARB  No results for input(s): ETH in the last 168 hours.  Ct Head Wo Contrast 04/16/2014    1.7 cm focal hypodensity over the right basal ganglia/internal capsule which may be a subacute to chronic infarct. Several other smaller bilateral old basal ganglia lacunar infarcts.  Chronic ischemic microvascular disease.        [name] Contrast 04/16/2014    Moderate-size acute/ subacute nonhemorrhagic infarct mid to superior right cerebellar with local mass effect.   Tiny acute nonhemorrhagic posterior right operculum infarct.   Subacute to remote partially hemorrhagic infarct right lenticular nucleus/mid right coronal radiata.   Remote bilateral thalamic infarcts.   Remote infarct anterior left external capsule.   Partially hemorrhagic remote left  caudate/anterior limb left duct internal capsule anterior left lenticular nucleus infarct.   Moderate small vessel disease type changes.  Small right vertebral artery may end predominantly in a posterior inferior cerebellar artery distribution. Left vertebral artery, basilar artery and internal carotid arteries appear patent.       PHYSICAL EXAM  NEUROLOGIC:   MENTAL STATUS: awake, alert, oriented. Follows all commands. Still dysarthric but seems a little better today. CRANIAL NERVES: pupils equal and reactive to light,extraocular muscles intact, facial sensation and  strength symmetric MOTOR:  Strength - 4/4 throughout except right grip 3/5 SENSORY: normal and symmetric to light touch  COORDINATION: finger-nose-finger with tremor on the right and significant dysmetria on the left. REFLEXES: deep tendon reflexes present and symmetric - no babinski    ASSESSMENT/PLAN [name] is a 69 y.o. male with history of hypertension, hyperlipidemia, diabetes mellitus, cancer, previous strokes and TIAs presenting with slurred speech and right hemiparesis.  He did not receive IV t-PA due to late presentation.  Strokes:  Bilateral - probably embolic - unknown source.   Resultant  Dysarthria - mild right hemiparesis  MRI  as above  MRA  not performed  Carotid Doppler  pending  2D Echo - EF 55-60%. No cardiac source of emboli identified.  LDL - 162  HgbA1c pending  Lovenox for VTE prophylaxis DIET DYS 3 Room service appropriate?: Yes; Fluid consistency:: Thin  aspirin 81 mg orally every day prior to admission, now on aspirin 300 mg suppository daily  Ongoing aggressive stroke risk factor management  Therapy recommendations:  Inpatient rehabilitation recommended. Rehabilitation consult pending.  Disposition:  Pending  Hypertension  Home meds: Lisinopril/hydrochlorothiazide Permissive hypertension <220/120 for 24-48 hours and then gradually normalize within 5-7 days   Hyperlipidemia  Home meds:  Pravachol 80 mg daily not resumed in hospital - NPO  LDL 162, goal < 70  Changed to Lipitor  Continue statin at discharge  Diabetes  HgbA1c pending, goal < 7.0  Controlled  Other Stroke Risk Factors  Advanced age  ETOH use  Hx stroke/TIA   Other Active Problems  Renal insufficiency - resolved  PLAN  Have asked cardiology to schedule patient for loop in TEE on Monday.   Hospital day # 2  Mikey Bussing [facility] Pager [phone] 04/18/2014, 11:32 AM  Lenor Coffin [phone]    To  contact Stroke Continuity provider, please refer to [email or web address] After hours, contact General Neurology

## 2014-04-18 NOTE — Progress Notes (Signed)
Patient walked without staff assistance. Patient asked to notify and wait for staff prior to assistance, patient educated about falls. Bed alarm on, fall mat to be placed at bedside. Call bell within patient's reach. Will continue to monitor closely.

## 2014-04-18 NOTE — Progress Notes (Signed)
[facility] PROGRESS NOTE  HASSAN ALOISI [id number] DOB: 1946/01/08 DOA: 04/16/2014 PCP: No primary care provider on file.  Brief Summary  Hayden Mckinney is a 69 y.o. male, with history hypertension, diabetes, and TIAs who presented with 2 weeks history of increasing weakness, falls and slurred speech.  His sister noticed his slurred speech and advised him to come to the hospital.  MRI demonstrated a moderate sized acute to subacute infarct in the mid to superior right cerebellum with local mass effect. He also had tiny acute posterior right operculum infarct, subacute partially hemorrhagic infarct of the right lenticular nucleus and right mid coronal radiata.  He also had evidence of remote bilateral infarcts, which were partially hemorrhagic.  He failed his swallow evaluation and has been admitted for further workup of his strokes and therapy.  Assessment/Plan  Acute to subacute infarcts with persistent dysarthria and dysphagia, developing some numbness of the right upper and left lower extremities with worsening weakness in the same extremities.  Likely embolic strokes -  MRA brain:  Not done -  Echo:  Preserved EF, no wall motion abnl or valve dysfunction or obvious PFO -  TEE and loop recorder scheduled for Monday -  Carotid duplex:  pending -  PT/OT/speech therapy:  Recommending CIR -  PM&R consult placed -  Asa 325mg  daily  -  Telemetry: Normal sinus rhythm, no arrhythmias -  Hemoglobin A1c pending and lipid panel as below  Dysphagia -  Swallow evaluation:  Dysphagia 3 with thin  Hypertension, blood pressures elevated -  Start carvedilol and bidil -  Continue prn hydralazine  Hyperlipidemia, LDL 162, goal < 70 -  Start high dose statin  Diabetes mellitus, type II with strokes -  A1c pending -  Low-dose sliding scale insulin -  Hold metformin  AKI, creatinine initially 1.8 peak and trended down to 1.19 -  Continue mild hydration -  Minimize nephrotoxins and  renally dose medications  Diet:  Dysphagia 3 with thin Access:  PIV IVF:  Yes Proph:  Lovenox  Code Status: Full Family Communication: Patient alone Disposition Plan: Pending ability to take in nutrition, further evaluation for etiology of strokes   Consultants:  Neurology  Procedures:  MRI brain  Antibiotics:  None   HPI/Subjective:  More difficult to understand today.  States his speech has been worse since his bath today and now he has numbness of the right arm and left leg.  Objective: Filed Vitals:   04/18/14 0145 04/18/14 0535 04/18/14 0754 04/18/14 0914  BP: 153/74 177/79  141/71  Pulse: 58 58 75 64  Temp: 98.2 F (36.8 C) 98.4 F (36.9 C)  98.3 F (36.8 C)  TempSrc: Oral Oral  Oral  Resp: 16 18  20   Height:      Weight:      SpO2: 98% 100%  99%    Intake/Output Summary (Last 24 hours) at 04/18/14 1246 Last data filed at 04/18/14 1209  Gross per 24 hour  Intake      0 ml  Output    750 ml  Net   -750 ml   Filed Weights   04/16/14 2054  Weight: 67.1 kg (147 lb 14.9 oz)    Exam:   General:  Adult male, No acute distress, more dysarthric today.  Dysarthria makes it difficult to tell if he also has some aphasia  HEENT:  NCAT, MMM  Cardiovascular:  RRR, nl S1, S2 no mrg, 2+ pulses, warm extremities  Respiratory:  CTAB,  no increased WOB  Abdomen:   NABS, soft, NT/ND  MSK:   Decreased tone and bulk of RLE with some abrasions on knee and shin, no LEE  Neuro:  Dysarthria, no facial droop.  Tongue straight, palate slightly low on right compared to left.  Bilateral upper extremities 5-/5, mild dysmetria on the left, foot drop on the right, otherwise strength 5-/5, symmetric 2+ reflexes.  Sensation decreased on left lower extremity and right hand.    Data Reviewed: Basic Metabolic Panel:  Recent Labs Lab 04/16/14 1432 04/17/14 1543 04/18/14 0619  NA 139 139 140  K 4.0 3.5 3.5  CL 104 104 107  CO2 26 26 26   GLUCOSE 209* 201* 150*  BUN  33* 25* 21  CREATININE 1.81* 1.47* 1.19  CALCIUM 9.3 8.5 8.8   Liver Function Tests: No results for input(s): AST, ALT, ALKPHOS, BILITOT, PROT, ALBUMIN in the last 168 hours. No results for input(s): LIPASE, AMYLASE in the last 168 hours. No results for input(s): AMMONIA in the last 168 hours. CBC:  Recent Labs Lab 04/16/14 1432  WBC 9.0  HGB 13.3  HCT 40.2  MCV 83.8  PLT 287   Cardiac Enzymes: No results for input(s): CKTOTAL, CKMB, CKMBINDEX, TROPONINI in the last 168 hours. BNP (last 3 results) No results for input(s): BNP in the last 8760 hours.  ProBNP (last 3 results) No results for input(s): PROBNP in the last 8760 hours.  CBG:  Recent Labs Lab 04/17/14 1608 04/17/14 1843 04/17/14 2203 04/18/14 0647 04/18/14 1106  GLUCAP 181* 202* 226* 163* 150*    No results found for this or any previous visit (from the past 240 hour(s)).   Studies: Ct Head Wo Contrast  04/16/2014   CLINICAL DATA:  Right-sided weakness with slurred speech.  EXAM: CT HEAD WITHOUT CONTRAST  TECHNIQUE: Contiguous axial images were obtained from the base of the skull through the vertex without intravenous contrast.  COMPARISON:  CT 08/07/2010 as well as MRI 08/07/2010.  FINDINGS: Ventricles, cisterns and other CSF spaces are within normal. There is mild chronic ischemic microvascular disease. There are several small old lacune infarct over the base a ganglia bilaterally. There is no oval 1.7 cm hypodensity extending from the region of the right head of caudate nucleus to the lentiform nucleus which may be a subacute to chronic focal infarct. There is no focal mass, mass effect or shift of midline structures. There is no evidence of acute hemorrhage. Remaining bony and soft tissue structures are within normal.  IMPRESSION: 1.7 cm focal hypodensity over the right basal ganglia/internal capsule which may be a subacute to chronic infarct. Several other smaller bilateral old basal ganglia lacunar infarcts.   Chronic ischemic microvascular disease.   Electronically Signed   By: Marin Olp M.D.   On: 04/16/2014 15:37   [name] Contrast  04/16/2014   CLINICAL DATA:  69 year old diabetic male with prior TIA now presenting with slurred speech and right-sided weakness for 2 weeks. History sarcoma. Initial encounter.  EXAM: MRI HEAD WITHOUT CONTRAST  TECHNIQUE: Multiplanar, multiecho pulse sequences of the brain and surrounding structures were obtained without intravenous contrast.  COMPARISON:  04/16/2014 head CT.  08/07/2010 brain MR.  FINDINGS: Exam is motion degraded.  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  Tiny acute nonhemorrhagic posterior right operculum infarct.  Subacute to remote partially hemorrhagic infarct right lenticular nucleus/mid right coronal radiata.  Remote bilateral thalamic infarcts.  Remote infarct anterior left  external capsule.  Partially hemorrhagic remote left caudate/anterior limb left duct internal capsule anterior left lenticular nucleus infarct.  Moderate small vessel disease type changes.  Global atrophy without hydrocephalus.  No intracranial mass lesion noted on this unenhanced exam.  Small right vertebral artery may end predominantly in a posterior inferior cerebellar artery distribution. Left vertebral artery, basilar artery and internal carotid arteries appear patent.  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  Tiny acute nonhemorrhagic posterior right operculum infarct.  Subacute to remote partially hemorrhagic infarct right lenticular nucleus/mid right coronal radiata.  Remote bilateral thalamic infarcts.  Remote infarct anterior left external capsule.  Partially hemorrhagic remote left caudate/anterior limb left duct internal capsule anterior left lenticular  nucleus infarct.  Moderate small vessel disease type changes.  Small right vertebral artery may end predominantly in a posterior inferior cerebellar artery distribution. Left vertebral artery, basilar artery and internal carotid arteries appear patent.   Electronically Signed   By: Genia Del M.D.   On: 04/16/2014 19:00    Scheduled Meds: . aspirin  300 mg Rectal Daily   Or  . aspirin  325 mg Oral Daily  . atorvastatin  40 mg Oral q1800  . carvedilol  6.25 mg Oral BID WC  . enoxaparin (LOVENOX) injection  40 mg Subcutaneous Q24H  . insulin aspart  0-5 Units Subcutaneous QHS  . insulin aspart  0-9 Units Subcutaneous TID WC  . isosorbide-hydrALAZINE  1 tablet Oral TID   Continuous Infusions: . sodium chloride 50 mL/hr at 04/16/14 2146    Active Problems:   Type II diabetes mellitus with neurological manifestations   HTN (hypertension)   Hyperlipidemia   Stroke   CVA (cerebral infarction)    Time spent: 30 min    Brookes Craine, [facility] Pager [phone]. If 7PM-7AM, please contact night-coverage at [email or web address], password [facility] 04/18/2014, 12:46 PM  LOS: 2 days

## 2014-04-18 NOTE — Progress Notes (Signed)
Bed alarm on. Fall mat on patient's side of bed. Call bell within patient's reach. Will continue to monitor closely

## 2014-04-19 ENCOUNTER — Inpatient Hospital Stay (HOSPITAL_COMMUNITY): Payer: Medicare Other

## 2014-04-19 ENCOUNTER — Encounter (HOSPITAL_COMMUNITY): Payer: Self-pay | Admitting: Radiology

## 2014-04-19 DIAGNOSIS — I6349 Cerebral infarction due to embolism of other cerebral artery: Secondary | ICD-10-CM

## 2014-04-19 DIAGNOSIS — E1140 Type 2 diabetes mellitus with diabetic neuropathy, unspecified: Secondary | ICD-10-CM

## 2014-04-19 DIAGNOSIS — R479 Unspecified speech disturbances: Secondary | ICD-10-CM

## 2014-04-19 DIAGNOSIS — I639 Cerebral infarction, unspecified: Secondary | ICD-10-CM

## 2014-04-19 DIAGNOSIS — I6359 Cerebral infarction due to unspecified occlusion or stenosis of other cerebral artery: Secondary | ICD-10-CM

## 2014-04-19 LAB — BASIC METABOLIC PANEL
Anion gap: 3 — ABNORMAL LOW (ref 5–15)
BUN: 24 mg/dL — AB (ref 6–23)
CALCIUM: 8.4 mg/dL (ref 8.4–10.5)
CO2: 25 mmol/L (ref 19–32)
CREATININE: 1.36 mg/dL — AB (ref 0.50–1.35)
Chloride: 109 mmol/L (ref 96–112)
GFR calc non Af Amer: 52 mL/min — ABNORMAL LOW (ref >90)
GFR, EST AFRICAN AMERICAN: 60 mL/min — AB (ref >90)
GLUCOSE: 167 mg/dL — AB (ref 70–99)
POTASSIUM: 3.7 mmol/L (ref 3.5–5.1)
Sodium: 137 mmol/L (ref 135–145)

## 2014-04-19 LAB — GLUCOSE, CAPILLARY
GLUCOSE-CAPILLARY: 144 mg/dL — AB (ref 70–99)
GLUCOSE-CAPILLARY: 147 mg/dL — AB (ref 70–99)
Glucose-Capillary: 162 mg/dL — ABNORMAL HIGH (ref 70–99)
Glucose-Capillary: 188 mg/dL — ABNORMAL HIGH (ref 70–99)

## 2014-04-19 MED ORDER — ASPIRIN EC 81 MG PO TBEC
81.0000 mg | DELAYED_RELEASE_TABLET | Freq: Every day | ORAL | Status: DC
  Administered 2014-04-20 – 2014-04-21 (×2): 81 mg via ORAL
  Filled 2014-04-19 (×2): qty 1

## 2014-04-19 MED ORDER — IOHEXOL 350 MG/ML SOLN
50.0000 mL | Freq: Once | INTRAVENOUS | Status: AC | PRN
  Administered 2014-04-19: 50 mL via INTRAVENOUS

## 2014-04-19 MED ORDER — CLOPIDOGREL BISULFATE 75 MG PO TABS
75.0000 mg | ORAL_TABLET | Freq: Every day | ORAL | Status: DC
  Administered 2014-04-19 – 2014-04-21 (×3): 75 mg via ORAL
  Filled 2014-04-19 (×3): qty 1

## 2014-04-19 NOTE — Evaluation (Signed)
Speech Language Pathology Evaluation Patient Details Name: Hayden Mckinney MRN: [id number] DOB: September 21, 1945 Today's Date: 04/19/2014 Time: 1210-1219 SLP Time Calculation (min) (ACUTE ONLY): 9 min  Problem List:  Patient Active Problem List   Diagnosis Date Noted  . Speech abnormality   . Stroke 04/16/2014  . CVA (cerebral infarction) 04/16/2014  . Type II diabetes mellitus with neurological manifestations 08/25/2010  . HTN (hypertension) 08/25/2010  . Hyperlipidemia 08/25/2010   Past Medical History:  Past Medical History  Diagnosis Date  . Type II diabetes mellitus   . Sarcoma     a. R leg  . Diabetic peripheral neuropathy     a. both feet  . TIA (transient ischemic attack)     a. 08/2010.  Marland Kitchen Hyperlipidemia   . Hypertension   . Stroke     a. 04/2014 multiple bateral infarcts, presumed to be embolic;  b. [id number] Carotid U/S: 1-39% bilat ICA stenosis.  . H/O echocardiogram     a. 04/2014 Echo: EF 55-60%, no rwma.  Marland Kitchen ETOH abuse     a. 04/2014 18-24 beers q weekend.   Past Surgical History:  Past Surgical History  Procedure Laterality Date  . Sarcoma removal    . Repair of r hand after trauma     HPI:  69 y.o. male, with past medical history significant for hypertension and diabetes and TIAs in the past presenting with 2 weeks history of increasing weakness, falls and slurred speech that was noted today by his sister who was visiting him. It was difficult to get history from the patient due to his speech. Patient denies any history of chest pains shortness of breath nausea vomiting or headaches. Patient lives with a roommate at home.   Assessment / Plan / Recommendation Clinical Impression  Pt has a moderate dysarthria due to low vocal intensity and imprecise articulation in addition to inadequate dentition at baseline. Cueing for a slower rate of intake was effective at increasing intelligibility at the conversational level. Pt required Min cues for emergent awareness of acute  deficits, although basic memory, attention, and problem solving skills appear within gross functional limits. Pt will benefit from SLP services to maximize functional communication and awareness to increase safety. Recommend CIR level therapy.    SLP Assessment  Patient needs continued Speech Lanaguage Pathology Services    Follow Up Recommendations  Inpatient Rehab    Frequency and Duration min 2x/week  2 weeks   Pertinent Vitals/Pain Pain Assessment: No/denies pain   SLP Goals  Progression toward goals: Progressing toward goals Patient/Family Stated Goal: none stated Potential to Achieve Goals (ACUTE ONLY): Good  SLP Evaluation Prior Functioning  Type of Home: House  Lives With: Other (Comment) (roommate) Available Help at Discharge: Family;Available 24 hours/day Vocation: Part time employment   Cognition  Overall Cognitive Status: Impaired/Different from baseline Arousal/Alertness: Awake/alert Orientation Level: Oriented X4 Attention: Divided Divided Attention: Appears intact Memory: Appears intact Awareness: Impaired Awareness Impairment: Emergent impairment Problem Solving: Appears intact    Comprehension  Auditory Comprehension Overall Auditory Comprehension: Appears within functional limits for tasks assessed Reading Comprehension Reading Status: Within funtional limits    Expression Expression Primary Mode of Expression: Verbal Verbal Expression Overall Verbal Expression: Appears within functional limits for tasks assessed Written Expression Written Expression: Not tested   Oral / Motor Motor Speech Overall Motor Speech: Impaired Respiration: Within functional limits Phonation: Low vocal intensity Resonance: Within functional limits Articulation: Impaired Level of Impairment: Conversation Intelligibility: Intelligibility reduced Conversation: 75-100% accurate Motor Planning:  Witnin functional limits Motor Speech Errors: Not applicable Interfering  Components: Inadequate dentition Effective Techniques: Slow rate    [name], M.A. CCC-SLP [phone]  [name] 04/19/2014, 1:19 PM

## 2014-04-19 NOTE — Progress Notes (Signed)
Rehab Admissions Coordinator Note:  Patient was screened by Retta Diones for appropriateness for an Inpatient Acute Rehab Consult.  At this time, an inpatient rehab consult has been ordered and is pending completion.  We will follow up once consult is completed.  Retta Diones 04/19/2014, 8:59 AM  I can be reached at [phone].

## 2014-04-19 NOTE — Progress Notes (Signed)
Speech Language Pathology Treatment: Dysphagia  Patient Details Name: Hayden Mckinney MRN: [id number] DOB: Jan 07, 1946 Today's Date: 04/19/2014 Time: 1201-1210 SLP Time Calculation (min) (ACUTE ONLY): 9 min  Assessment / Plan / Recommendation Clinical Impression  Pt was eating lunch tray upon SLP arrival. Immediate cough noted x1 during period of prolonged mastication. Pt required Min verbal/visual cues with use of mirror for emergent awareness and self-management of left-sided buccal pocketing. Recommend to continue current diet at this time - will continue to follow for readiness to advance.   HPI HPI: 69 y.o. male, with past medical history significant for hypertension and diabetes and TIAs in the past presenting with 2 weeks history of increasing weakness, falls and slurred speech that was noted today by his sister who was visiting him. It was difficult to get history from the patient due to his speech. Patient denies any history of chest pains shortness of breath nausea vomiting or headaches. Patient lives with a roommate at home.   Pertinent Vitals Pain Assessment: No/denies pain  SLP Plan  Continue with current plan of care    Recommendations Diet recommendations: Dysphagia 3 (mechanical soft) Liquids provided via: Cup;Straw Medication Administration: Whole meds with liquid Supervision: Patient able to self feed;Intermittent supervision to cue for compensatory strategies Compensations: Slow rate;Small sips/bites Postural Changes and/or Swallow Maneuvers: Seated upright 90 degrees;Upright 30-60 min after meal       Oral Care Recommendations: Oral care BID Follow up Recommendations: Inpatient Rehab Plan: Continue with current plan of care    [name], M.A. CCC-SLP [phone]  [name] 04/19/2014, 12:26 PM

## 2014-04-19 NOTE — Progress Notes (Signed)
PT Cancellation Note  Patient Details Name: QUARAN QUERTERMOUS MRN: [id number] DOB: 05/13/1945   Cancelled Treatment:    Reason Eval/Treat Not Completed: Patient at procedure or test/unavailable   Duncan Dull 04/19/2014, 9:17 AM Alben Deeds, PT DPT  [phone]

## 2014-04-19 NOTE — Progress Notes (Signed)
Physical Therapy Treatment Patient Details Name: TIMO DRUST MRN: [id number] DOB: 02-03-1945 Today's Date: 04/19/2014    History of Present Illness 69 y.o. male hx of HTN, HLD, DM, prior TIA presenting with slurred speech and right sided weakness x 2 weeks. He also notes his right sided weakness has worsened in the past few days. MRI revealed acute infarcts in mid to superior right cerebellum, and small acute infarct in right operculum. Multiple old infarcts also noted    PT Comments    Patient seen for therapy progression. Ambulation and gait retraining performed. Patient tolerated well. Continues to demonstrate significant ataxia and gait deviations impacting patients ability to mobilize safely.  Patient continues to require +2 assist for mobility and moderate to maximal cueing. Continue to feel patient will need CIR upon acute discharge.  Will see as indicated and progress as tolerated.    Follow Up Recommendations  CIR     Equipment Recommendations  Other (comment) (tbd)    Recommendations for Other Services Rehab consult     Precautions / Restrictions Precautions Precautions: Fall Restrictions Weight Bearing Restrictions: No    Mobility  Bed Mobility Overal bed mobility: Needs Assistance Bed Mobility: Supine to Sit     Supine to sit: Supervision;HOB elevated (use of rail)        Transfers Overall transfer level: Needs assistance Equipment used: Rolling walker (2 wheeled) Transfers: Sit to/from Stand Sit to Stand: Min assist Stand pivot transfers: Min assist       General transfer comment: min assist to rise and for balance, ataxic quality to movement  Ambulation/Gait Ambulation/Gait assistance: Mod assist;+2 physical assistance Ambulation Distance (Feet): 80 Feet (x2 with seated rest break) Assistive device: Rolling walker (2 wheeled) Gait Pattern/deviations: Step-to pattern;Decreased stride length;Ataxic;Decreased dorsiflexion - right;Narrow base of  support;Scissoring Gait velocity: decreased Gait velocity interpretation: Below normal speed for age/gender General Gait Details: patient remains significantly ataxic with mobility, unable to ambulate safely without bilateral +2 assist, patient with poor coordination of drop foot during ambulation. Specific gait training cues for positioning of stride, cued to increase width between LEs (scissoring gait at times). Verbal and tactile cues for posture during gait retraining.    Stairs            Wheelchair Mobility    Modified Rankin (Stroke Patients Only) Modified Rankin (Stroke Patients Only) Pre-Morbid Rankin Score: Moderate disability Modified Rankin: Moderately severe disability     Balance     Sitting balance-Leahy Scale: Fair       Standing balance-Leahy Scale: Poor                      Cognition Arousal/Alertness: Awake/alert Behavior During Therapy: WFL for tasks assessed/performed;Impulsive Overall Cognitive Status: Impaired/Different from baseline Area of Impairment: Safety/judgement         Safety/Judgement: Decreased awareness of deficits;Decreased awareness of safety     General Comments: Patient slightly more impulsive this session, nsg aware    Exercises      General Comments General comments (skin integrity, edema, etc.): of note, some bleeding on LUE antecubital area where IV was removed, blood on sheets noted.       Pertinent Vitals/Pain Pain Assessment: No/denies pain    Home Living     Available Help at Discharge: Family;Available 24 hours/day Type of Home: House              Prior Function            PT Goals (  current goals can now be found in the care plan section) Acute Rehab PT Goals Patient Stated Goal: walk PT Goal Formulation: With patient Time For Goal Achievement: 05/01/14 Potential to Achieve Goals: Good Progress towards PT goals: Progressing toward goals    Frequency  Min 4X/week    PT Plan  Current plan remains appropriate    Co-evaluation             End of Session Equipment Utilized During Treatment: Gait belt Activity Tolerance: Patient tolerated treatment well Patient left: in chair;with call bell/phone within reach;with chair alarm set (SLP entering room)     Time: [id number] PT Time Calculation (min) (ACUTE ONLY): 21 min  Charges:  $Gait Training: 8-22 mins                    G CodesDuncan Dull 05-02-2014, 2:42 PM Alben Deeds, [place] DPT  [phone]

## 2014-04-19 NOTE — Consult Note (Signed)
Physical Medicine and Rehabilitation Consult Reason for Consult: CVA Referring Physician: [facility]   HPI: Hayden Mckinney is a 69 y.o. right handed male with history of hypertension, diabetes mellitus peripheral neuropathy, TIA maintained on aspirin 81 mg daily. Presented 04/16/2014 with right-sided weakness and slurred speech 2 weeks. MRI of the brain shows moderate size acute subacute nonhemorrhagic infarct mid to superior right cerebellar with local mass effect as well as tiny acute nonhemorrhagic posterior right operculum infarct, subacute to remote partially hemorrhagic infarct right lenticular nucleus mid right coronal radiata and remote bilateral thalamic and anterior left external capsule infarct. Echocardiogram with ejection fraction of 60% and no regional wall motion abnormalities. Carotid Dopplers with no ICA stenosis. CTA of the neck showed approximately 60% stenosis right internal carotid artery. CTA of the head with grade stenosis right vertebral artery. Patient did not receive TPA. Neurology consulted presently on aspirin 325 mg daily for CVA prophylaxis as well as subcutaneous Lovenox for DVT prophylaxis. Await planned TEE and possible loop recorder. Physical therapy evaluation completed with recommendations of physical medicine rehabilitation consult.   Review of Systems  Eyes: Positive for pain.  Gastrointestinal: Positive for constipation.  Musculoskeletal: Positive for myalgias.  Neurological: Positive for speech change and weakness.  All other systems reviewed and are negative.  Past Medical History   Diagnosis  Date   .  Diabetes mellitus     .  Cancer  sarcoma of rt leg   .  Sarcoma     .  Numbness in feet         both feet   .  Stroke  08/2010       TIA   .  Numbness and tingling in left hand         due to stroke   .  Hyperlipidemia     .  Hypertension      Past Surgical History   Procedure  Laterality  Date   .  Sarcoma removal       .  Repair of r hand  after trauma        Family History   Problem  Relation  Age of Onset   .  Cancer  Mother     .  Cancer  Father     .  Diabetes  Brother      Social History:  reports that he has never smoked. He has never used smokeless tobacco. He reports that he drinks about 3.0 oz of alcohol per week. He reports that he does not use illicit drugs.   Allergies: No Known Allergies   Medications Prior to Admission   Medication  Sig  Dispense  Refill   .  Blood Glucose Monitoring Suppl (RELION CONFIRM GLUCOSE MONITOR) W/DEVICE KIT  Use as instructed.  1 kit  0   .  lisinopril-hydrochlorothiazide (PRINZIDE) 10-12.5 MG per tablet  Take 1 tablet by mouth daily.  60 tablet  6   .  metFORMIN (GLUCOPHAGE) 1000 MG tablet  Take 1 tablet (1,000 mg total) by mouth 2 (two) times daily with a meal.  120 tablet  6   .  pravastatin (PRAVACHOL) 80 MG tablet  Take 1 tablet (80 mg total) by mouth every evening.  30 tablet  11   .  pregabalin (LYRICA) 50 MG capsule  Take 1 capsule (50 mg total) by mouth 2 (two) times daily. (Patient not taking: Reported on 04/17/2014)  60 capsule  6     Home: Home  Living Family/patient expects to be discharged to:: Inpatient rehab Living Arrangements: Non-relatives/Friends Type of Home: House  Lives With: Other (Comment) (roommate)  Functional History: Prior Function Level of Independence: Independent Comments: ambulated holding furniture, independent with self care, assist to get groceries, could prepare a simple meal, shared housekeeping with roommate Functional Status:  Mobility: Bed Mobility Overal bed mobility: Needs Assistance Bed Mobility: Supine to Sit Supine to sit: Supervision, HOB elevated (use of rail) General bed mobility comments: HOB elevated, reliance on rail, increased time and assist for trunk positioning Transfers Overall transfer level: Needs assistance Equipment used: 1 person hand held assist Transfers: Sit to/from Stand, Stand Pivot Transfers Sit to  Stand: Min assist Stand pivot transfers: Min assist General transfer comment: min assist to rise and for balance, ataxic quality to movement Ambulation/Gait Ambulation/Gait assistance: Mod assist, +2 physical assistance Ambulation Distance (Feet): 50 Feet Assistive device: Rolling walker (2 wheeled) Gait Pattern/deviations: Step-to pattern, Decreased stride length, Ataxic, Decreased dorsiflexion - right, Narrow base of support, Scissoring Gait velocity: decreased Gait velocity interpretation: Below normal speed for age/gender General Gait Details: Patient very ataxic with gait, increased instability and multiple LOB requiring physical assist (+2) as well as needing assist to maintain RW on the ground secondary to coordination deficits    ADL: ADL Overall ADL's : Needs assistance/impaired Eating/Feeding: Set up, Sitting Grooming: Wash/dry hands, Wash/dry face, Brushing hair, Set up, Sitting Upper Body Bathing: Minimal assitance, Sitting Lower Body Bathing: Moderate assistance, Sit to/from stand Upper Body Dressing : Minimal assistance, Sitting Lower Body Dressing: Moderate assistance, Sit to/from stand Toilet Transfer: Minimal assistance, Buyer, retail Details (indicate cue type and reason): simulated bed to chair Toileting- Clothing Manipulation and Hygiene: Maximal assistance, Sit to/from stand General ADL Comments: Pt able to reach down to socks to pull them up, but not start them over his toes.  Cognition: Cognition Overall Cognitive Status: Impaired/Different from baseline Orientation Level: Oriented X4 Cognition Arousal/Alertness: Awake/alert Behavior During Therapy: WFL for tasks assessed/performed Overall Cognitive Status: Impaired/Different from baseline Area of Impairment: Safety/judgement Safety/Judgement: Decreased awareness of deficits, Decreased awareness of safety  Blood pressure 138/60, pulse 64, temperature 97.6 F (36.4 C), temperature source  Oral, resp. rate 18, height _0  (1.778 m), weight 67.1 kg (147 lb 14.9 oz), SpO2 99 %. Physical Exam  HENT: edentulous Head: Normocephalic.  Eyes: EOM are normal.  Neck: Normal range of motion. Neck supple. No thyromegaly present.  Cardiovascular: Normal rate and regular rhythm.   Respiratory: Effort normal and breath sounds normal. No respiratory distress.  GI: Soft. Bowel sounds are normal. He exhibits no distension.  Neurological: He is alert.  Speech is very dysarthric. He is able to provide his name and age as well as date of birth. Followed simple commands. Right limb ataxia, right PD. RUE motor 4 to 4+/5. RLE: 4/5 hf,ke. 0 to trace ADF and 3-4/5 APF. Sensation intact to pain on right side Skin: Skin is warm and dry.  Psych: pt pleasant and approriate      Lab Results Last 24 Hours    Results for orders placed or performed during the hospital encounter of 04/16/14 (from the past 24 hour(s))   Basic metabolic panel     Status: Abnormal     Collection Time: 04/18/14  6:19 AM   Result  Value  Ref Range     Sodium  140  135 - 145 mmol/L     Potassium  3.5  3.5 - 5.1 mmol/L  Chloride  107  96 - 112 mmol/L     CO2  26  19 - 32 mmol/L     Glucose, Bld  150 (H)  70 - 99 mg/dL     BUN  21  6 - 23 mg/dL     Creatinine, Ser  1.19  0.50 - 1.35 mg/dL     Calcium  8.8  8.4 - 10.5 mg/dL     GFR calc non Af Amer  61 (L)  >90 mL/min     GFR calc Af Amer  70 (L)  >90 mL/min     Anion gap  7  5 - 15   Glucose, capillary     Status: Abnormal     Collection Time: 04/18/14  6:47 AM   Result  Value  Ref Range     Glucose-Capillary  163 (H)  70 - 99 mg/dL     Comment 1  Notify RN       Comment 2  Document in Chart     Glucose, capillary     Status: Abnormal     Collection Time: 04/18/14 11:06 AM   Result  Value  Ref Range     Glucose-Capillary  150 (H)  70 - 99 mg/dL   Glucose, capillary     Status: Abnormal     Collection Time: 04/18/14  4:13 PM   Result  Value  Ref Range      Glucose-Capillary  217 (H)  70 - 99 mg/dL   Glucose, capillary     Status: Abnormal     Collection Time: 04/18/14  9:58 PM   Result  Value  Ref Range     Glucose-Capillary  182 (H)  70 - 99 mg/dL     Comment 1  Notify RN       Comment 2  Document in Chart         Imaging Results (Last 48 hours)    Ct Angio Head W/cm &/or Wo Cm  04/19/2014   CLINICAL DATA:  Slurred speech and RIGHT-sided weakness for 2 weeks, worsening speech difficulties today. History of hypertension, diabetes, hyperlipidemia, sarcoma.  EXAM: CT ANGIOGRAPHY HEAD AND NECK  TECHNIQUE: Multidetector CT imaging of the head and neck was performed using the standard protocol during bolus administration of intravenous contrast. Multiplanar CT image reconstructions and MIPs were obtained to evaluate the vascular anatomy. Carotid stenosis measurements (when applicable) are obtained utilizing NASCET criteria, using the distal internal carotid diameter as the denominator.  CONTRAST:  68m OMNIPAQUE IOHEXOL 350 MG/ML SOLN  COMPARISON:  MRI of the head April 16 2014  FINDINGS: CT HEAD  The ventricles and sulci are normal for age. No intraparenchymal hemorrhage, mass effect nor midline shift. Patchy supratentorial white matter hypodensities are within normal range for patient's age and though non-specific suggest sequelae of chronic small vessel ischemic disease. Wedge-like hypodensity RIGHT mid to superior cerebellum. RIGHT basal ganglia lacunar infarct was present previously, similar appearance of bilateral thalamus small lacunar infarcts. No abnormal intracranial enhancement.  No abnormal extra-axial fluid collections. Basal cisterns are patent. Minimal calcific atherosclerosis of the carotid siphons.  No skull fracture. Persistent metopic suture. The included ocular globes and orbital contents are non-suspicious. The mastoid aircells and included paranasal sinuses are well-aerated. Patient is edentulous.  CTA NECK  Normal appearance of the thoracic  arch, normal branch pattern. The origins of the innominate, left Common carotid artery and subclavian artery are widely patent, intimal thickening partially obscured by streak artifact  from retained LEFT subclavian venous contrast.  Bilateral Common carotid arteries are widely patent, coursing in a straight line fashion. Calcific atherosclerosis and eccentric intimal hematoma results in approximately 60% narrowing of RIGHT internal carotid artery by NASCET criteria, approximate 13 mm segment of stenosis beginning at the origin. Normal appearance of the mid to distal included internal carotid arteries.  Reflux of contrast limits evaluation the vertebral artery origins. Left vertebral artery is dominant. Suspected mid to high-grade stenosis the origin of LEFT vertebral artery with mild poststenotic dilatation.  No dissection, no pseudoaneurysm. No abnormal luminal irregularity. No contrast extravasation.  Soft tissues are unremarkable. No acute osseous process though bone windows have not been submitted.  CTA HEAD  Anterior circulation: Patent cervical internal carotid arteries, petrous, cavernous and supra clinoid internal carotid arteries. Intimal thickening results in at least mid grade stenosis anterior genu of the RIGHT carotid siphon. Widely patent anterior communicating artery. Congenitally dominant LEFT A1 segment. Suspected occlusion of RIGHT A1 segment. High-grade stenosis of proximal RIGHT M2 branch. Moderate luminal irregularity of the anterior and middle cerebral arteries.  Posterior circulation: LEFT vertebral artery is dominant. Mid grade stenosis of RIGHT vertebral artery which predominately in terminates in the RIGHT posterior inferior cerebellar artery, with occluded distal thready RIGHT vertebral artery. Mild stenosis LEFT vertebral artery. Widely patent basilar artery. High-grade stenosis suspected of RIGHT superior cerebellar artery origin with moderate luminal irregularity of the RIGHT greater  than LEFT superior cerebellar arterys. RIGHT vertebral artery terminates in the posterior inferior cerebellar artery. Patent posterior cerebral arteries with multifocal mid high-grade stenosis of the LEFT P2 segment. High-grade stenosis of RIGHT P1 segment with thready RIGHT posterior communicating artery present, moderate luminal regularity of the mid to distal RIGHT posterior cerebral artery.  No large vessel occlusion, dissection, luminal irregularity, contrast extravasation or aneurysm within the anterior nor posterior circulation.  IMPRESSION: CT HEAD: Evolving RIGHT superior cerebellar artery territory infarct.  Remote RIGHT basal ganglion and bilateral thalamus lacunar infarcts. Mild white matter changes suggest chronic small vessel ischemic disease.  CTA NECK: Approximately 60% stenosis RIGHT internal carotid artery by NASCET criteria. No large vessel occlusion.  CTA HEAD: Mid grade stenosis of RIGHT anterior genu of the carotid siphon, suspected occlusion of RIGHT A1 segment with patent A-comm artery. High-grade stenosis of proximal RIGHT M2 branch.  Mid grade stenosis of RIGHT vertebral artery, which predominately at terminates in the RIGHT pack of, and included distal thready RIGHT vertebral artery. Suspected high-grade stenosis of the RIGHT superior cerebellar artery origin. Multifocal high-grade stenosis of LEFT P2 segment. High-grade stenosis of RIGHT P1 segment with RIGHT posterior communicating artery present.  Moderate luminal regularity of the cerebral arteries most consistent with atherosclerosis.   Electronically Signed   By: Elon Alas   On: 04/19/2014 04:03   Ct Angio Neck W/cm &/or Wo/cm  04/19/2014   CLINICAL DATA:  Slurred speech and RIGHT-sided weakness for 2 weeks, worsening speech difficulties today. History of hypertension, diabetes, hyperlipidemia, sarcoma.  EXAM: CT ANGIOGRAPHY HEAD AND NECK  TECHNIQUE: Multidetector CT imaging of the head and neck was performed using the  standard protocol during bolus administration of intravenous contrast. Multiplanar CT image reconstructions and MIPs were obtained to evaluate the vascular anatomy. Carotid stenosis measurements (when applicable) are obtained utilizing NASCET criteria, using the distal internal carotid diameter as the denominator.  CONTRAST:  59m OMNIPAQUE IOHEXOL 350 MG/ML SOLN  COMPARISON:  MRI of the head April 16 2014  FINDINGS: CT HEAD  The ventricles and sulci  are normal for age. No intraparenchymal hemorrhage, mass effect nor midline shift. Patchy supratentorial white matter hypodensities are within normal range for patient's age and though non-specific suggest sequelae of chronic small vessel ischemic disease. Wedge-like hypodensity RIGHT mid to superior cerebellum. RIGHT basal ganglia lacunar infarct was present previously, similar appearance of bilateral thalamus small lacunar infarcts. No abnormal intracranial enhancement.  No abnormal extra-axial fluid collections. Basal cisterns are patent. Minimal calcific atherosclerosis of the carotid siphons.  No skull fracture. Persistent metopic suture. The included ocular globes and orbital contents are non-suspicious. The mastoid aircells and included paranasal sinuses are well-aerated. Patient is edentulous.  CTA NECK  Normal appearance of the thoracic arch, normal branch pattern. The origins of the innominate, left Common carotid artery and subclavian artery are widely patent, intimal thickening partially obscured by streak artifact from retained LEFT subclavian venous contrast.  Bilateral Common carotid arteries are widely patent, coursing in a straight line fashion. Calcific atherosclerosis and eccentric intimal hematoma results in approximately 60% narrowing of RIGHT internal carotid artery by NASCET criteria, approximate 13 mm segment of stenosis beginning at the origin. Normal appearance of the mid to distal included internal carotid arteries.  Reflux of contrast limits  evaluation the vertebral artery origins. Left vertebral artery is dominant. Suspected mid to high-grade stenosis the origin of LEFT vertebral artery with mild poststenotic dilatation.  No dissection, no pseudoaneurysm. No abnormal luminal irregularity. No contrast extravasation.  Soft tissues are unremarkable. No acute osseous process though bone windows have not been submitted.  CTA HEAD  Anterior circulation: Patent cervical internal carotid arteries, petrous, cavernous and supra clinoid internal carotid arteries. Intimal thickening results in at least mid grade stenosis anterior genu of the RIGHT carotid siphon. Widely patent anterior communicating artery. Congenitally dominant LEFT A1 segment. Suspected occlusion of RIGHT A1 segment. High-grade stenosis of proximal RIGHT M2 branch. Moderate luminal irregularity of the anterior and middle cerebral arteries.  Posterior circulation: LEFT vertebral artery is dominant. Mid grade stenosis of RIGHT vertebral artery which predominately in terminates in the RIGHT posterior inferior cerebellar artery, with occluded distal thready RIGHT vertebral artery. Mild stenosis LEFT vertebral artery. Widely patent basilar artery. High-grade stenosis suspected of RIGHT superior cerebellar artery origin with moderate luminal irregularity of the RIGHT greater than LEFT superior cerebellar arterys. RIGHT vertebral artery terminates in the posterior inferior cerebellar artery. Patent posterior cerebral arteries with multifocal mid high-grade stenosis of the LEFT P2 segment. High-grade stenosis of RIGHT P1 segment with thready RIGHT posterior communicating artery present, moderate luminal regularity of the mid to distal RIGHT posterior cerebral artery.  No large vessel occlusion, dissection, luminal irregularity, contrast extravasation or aneurysm within the anterior nor posterior circulation. IMPRESSION: CT HEAD: Evolving RIGHT superior cerebellar artery territory infarct.  Remote RIGHT  basal ganglion and bilateral thalamus lacunar infarcts. Mild white matter changes suggest chronic small vessel ischemic disease.  CTA NECK: Approximately 60% stenosis RIGHT internal carotid artery by NASCET criteria. No large vessel occlusion.  CTA HEAD: Mid grade stenosis of RIGHT anterior genu of the carotid siphon, suspected occlusion of RIGHT A1 segment with patent A-comm artery. High-grade stenosis of proximal RIGHT M2 branch.  Mid grade stenosis of RIGHT vertebral artery, which predominately at terminates in the RIGHT pack of, and included distal thready RIGHT vertebral artery. Suspected high-grade stenosis of the RIGHT superior cerebellar artery origin. Multifocal high-grade stenosis of LEFT P2 segment. High-grade stenosis of RIGHT P1 segment with RIGHT posterior communicating artery present.  Moderate luminal regularity of the cerebral arteries  most consistent with atherosclerosis.   Electronically Signed   By: Elon Alas   On: 04/19/2014 04:03      Assessment/Plan: Diagnosis: right cerebellar as well as other subcortical infarcts 1. Does the need for close, 24 hr/day medical supervision in concert with the patient's rehab needs make it unreasonable for this patient to be served in a less intensive setting? Yes 2. Co-Morbidities requiring supervision/potential complications: dm, sarcoma RLE with peroneal nerve injury, , htn 3. Due to bladder management, bowel management, safety, skin/wound care, disease management, medication administration, pain management and patient education, does the patient require 24 hr/day rehab nursing? Yes 4. Does the patient require coordinated care of a physician, rehab nurse, PT (1-2 hrs/day, 5 days/week), OT (1-2 hrs/day, 5 days/week) and SLP (1-2 hrs/day, 5 days/week) to address physical and functional deficits in the context of the above medical diagnosis(es)? Yes Addressing deficits in the following areas: balance, endurance, locomotion, strength,  transferring, bowel/bladder control, bathing, dressing, feeding, grooming, toileting, speech, swallowing and psychosocial support 5. Can the patient actively participate in an intensive therapy program of at least 3 hrs of therapy per day at least 5 days per week? Yes 6. The potential for patient to make measurable gains while on inpatient rehab is excellent 7. Anticipated functional outcomes upon discharge from inpatient rehab are modified independent  with PT, modified independent with OT, modified independent and supervision with SLP. 8. Estimated rehab length of stay to reach the above functional goals is: 10-14 days 9. Does the patient have adequate social supports and living environment to accommodate these discharge functional goals? Yes 10. Anticipated D/C setting: Home 11. Anticipated post D/C treatments: HH therapy and Outpatient therapy 12. Overall Rehab/Functional Prognosis: excellent  RECOMMENDATIONS: This patient's condition is appropriate for continued rehabilitative care in the following setting: CIR Patient has agreed to participate in recommended program. Yes Note that insurance prior authorization may be required for reimbursement for recommended care.  Comment: Rehab Admissions Coordinator to follow up.  Thanks,  Meredith Staggers, MD, Mell[facility] Drown     04/19/2014

## 2014-04-19 NOTE — Progress Notes (Signed)
[facility] PROGRESS NOTE  Hayden Mckinney [id number] DOB: 21-May-1945 DOA: 04/16/2014 PCP: No primary care provider on file.  Brief Summary  Hayden Mckinney is a 69 y.o. male, with history hypertension, diabetes, and TIAs who presented with 2 weeks history of increasing weakness, falls and slurred speech.  His sister noticed his slurred speech and advised him to come to the hospital.  MRI demonstrated a moderate sized acute to subacute infarct in the mid to superior right cerebellum with local mass effect. He also had tiny acute posterior right operculum infarct, subacute partially hemorrhagic infarct of the right lenticular nucleus and right mid coronal radiata.  He also had evidence of remote bilateral infarcts, which were partially hemorrhagic.  He failed his swallow evaluation and has been admitted for further workup of his strokes and therapy.  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  -  CT angio head and neck:  Evolving right superior cerebellar artery territory infarct, remote right basal ganglion and bilateral thalamus lacunar infarcts. -  CT neck with approximate 60% stenosis of the right internal carotid artery, no large vessel occlusion. 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 -  Will need vascular follow up in 3-6 months  Dysphagia -  Swallow evaluation:  Dysphagia 3 with thin  Hypertension, blood  pressures better controlled -  Continue carvedilol and bidil -  Continue prn hydralazine  Hyperlipidemia, LDL 162, goal < 70 -  Started high dose statin  Diabetes mellitus, type II with strokes -  A1c pending -  Low-dose sliding scale insulin -  Hold metformin  Acute kidney injury, creatinine initially 1.8 peak and trended down to 1.19, but now up to 1.36 (may be due to recent contrast -  Continue mild hydration -  Minimize nephrotoxins and renally dose medications  Diet:  Dysphagia 3 with thin Access:  PIV IVF:  Yes Proph:  Lovenox  Code Status: Full Family Communication: Patient alone Disposition Plan:  Pending TEE on 4/12   Consultants:  Neurology  Procedures:  MRI brain  CTA head with midgrade stenosis of the right anterior genu of the carotid siphon, probable occlusion of the right A1 segment with patent anterior community getting artery, high-grade stenosis of the proximal right M2 branch, midgrade stenosis of the right vertebral artery with the ready right vertebral artery flow, suspected high-grade stenosis of the right superior cerebellar artery, multifocal high-grade stenosis of the left P2 segment, high-grade stenosis of the right P1 segment with right posterior communicating artery present.   Antibiotics:  None   HPI/Subjective:  Speech a little better today, numbness in left arm and right leg slightly better but still present.     Objective: Filed Vitals:   04/18/14 1705 04/18/14 2212 04/19/14 0250 04/19/14 0621  BP: 117/71 134/57 138/60 132/65  Pulse: 72 71 64 67  Temp: 98.1 F (36.7 C) 97.9 F (36.6 C) 97.6 F (36.4  C) 98 F (36.7 C)  TempSrc: Oral Oral Oral Oral  Resp: 20 18 18 18   Height:      Weight:      SpO2: 100% 100% 99% 100%    Intake/Output Summary (Last 24 hours) at 04/19/14 0851 Last data filed at 04/18/14 1209  Gross per 24 hour  Intake      0 ml  Output    350 ml  Net   -350 ml   Filed Weights   04/16/14 2054  Weight:  67.1 kg (147 lb 14.9 oz)    Exam:   General:  Adult male, No acute distress, dysarthric.  HEENT:  NCAT, MMM  Cardiovascular:  RRR, nl S1, S2 no mrg, 2+ pulses, warm extremities  Respiratory:  CTAB, no increased WOB  Abdomen:   NABS, soft, NT/ND  MSK:   Decreased tone and bulk of RLE with some abrasions on knee and shin, no LEE  Neuro:  Dysarthria, no facial droop.  Tongue straight, palate slightly low on right compared to left.  Bilateral upper extremities 5-/5, mild dysmetria on the left, foot drop on the right, otherwise strength 5-/5, symmetric 2+ reflexes.  Sensation decreased on right lower extremity and left hand.    Data Reviewed: Basic Metabolic Panel:  Recent Labs Lab 04/16/14 1432 04/17/14 1543 04/18/14 0619 04/19/14 0658  NA 139 139 140 137  K 4.0 3.5 3.5 3.7  CL 104 104 107 109  CO2 26 26 26 25   GLUCOSE 209* 201* 150* 167*  BUN 33* 25* 21 24*  CREATININE 1.81* 1.47* 1.19 1.36*  CALCIUM 9.3 8.5 8.8 8.4   Liver Function Tests: No results for input(s): AST, ALT, ALKPHOS, BILITOT, PROT, ALBUMIN in the last 168 hours. No results for input(s): LIPASE, AMYLASE in the last 168 hours. No results for input(s): AMMONIA in the last 168 hours. CBC:  Recent Labs Lab 04/16/14 1432  WBC 9.0  HGB 13.3  HCT 40.2  MCV 83.8  PLT 287   Cardiac Enzymes: No results for input(s): CKTOTAL, CKMB, CKMBINDEX, TROPONINI in the last 168 hours. BNP (last 3 results) No results for input(s): BNP in the last 8760 hours.  ProBNP (last 3 results) No results for input(s): PROBNP in the last 8760 hours.  CBG:  Recent Labs Lab 04/18/14 0647 04/18/14 1106 04/18/14 1613 04/18/14 2158 04/19/14 0710  GLUCAP 163* 150* 217* 182* 144*    No results found for this or any previous visit (from the past 240 hour(s)).   Studies: Ct Angio Head W/cm &/or Wo Cm  04/19/2014   CLINICAL DATA:  Slurred speech and RIGHT-sided weakness for 2 weeks, worsening speech difficulties today.  History of hypertension, diabetes, hyperlipidemia, sarcoma.  EXAM: CT ANGIOGRAPHY HEAD AND NECK  TECHNIQUE: Multidetector CT imaging of the head and neck was performed using the standard protocol during bolus administration of intravenous contrast. Multiplanar CT image reconstructions and MIPs were obtained to evaluate the vascular anatomy. Carotid stenosis measurements (when applicable) are obtained utilizing NASCET criteria, using the distal internal carotid diameter as the denominator.  CONTRAST:  51mL OMNIPAQUE IOHEXOL 350 MG/ML SOLN  COMPARISON:  MRI of the head April 16 2014  FINDINGS: CT HEAD  The ventricles and sulci are normal for age. No intraparenchymal hemorrhage, mass effect nor midline shift. Patchy supratentorial white matter hypodensities are within normal range for patient's age and though non-specific suggest sequelae of chronic small vessel ischemic disease. Wedge-like hypodensity RIGHT mid to superior cerebellum. RIGHT basal ganglia lacunar infarct was  present previously, similar appearance of bilateral thalamus small lacunar infarcts. No abnormal intracranial enhancement.  No abnormal extra-axial fluid collections. Basal cisterns are patent. Minimal calcific atherosclerosis of the carotid siphons.  No skull fracture. Persistent metopic suture. The included ocular globes and orbital contents are non-suspicious. The mastoid aircells and included paranasal sinuses are well-aerated. Patient is edentulous.  CTA NECK  Normal appearance of the thoracic arch, normal branch pattern. The origins of the innominate, left Common carotid artery and subclavian artery are widely patent, intimal thickening partially obscured by streak artifact from retained LEFT subclavian venous contrast.  Bilateral Common carotid arteries are widely patent, coursing in a straight line fashion. Calcific atherosclerosis and eccentric intimal hematoma results in approximately 60% narrowing of RIGHT internal carotid artery by  NASCET criteria, approximate 13 mm segment of stenosis beginning at the origin. Normal appearance of the mid to distal included internal carotid arteries.  Reflux of contrast limits evaluation the vertebral artery origins. Left vertebral artery is dominant. Suspected mid to high-grade stenosis the origin of LEFT vertebral artery with mild poststenotic dilatation.  No dissection, no pseudoaneurysm. No abnormal luminal irregularity. No contrast extravasation.  Soft tissues are unremarkable. No acute osseous process though bone windows have not been submitted.  CTA HEAD  Anterior circulation: Patent cervical internal carotid arteries, petrous, cavernous and supra clinoid internal carotid arteries. Intimal thickening results in at least mid grade stenosis anterior genu of the RIGHT carotid siphon. Widely patent anterior communicating artery. Congenitally dominant LEFT A1 segment. Suspected occlusion of RIGHT A1 segment. High-grade stenosis of proximal RIGHT M2 branch. Moderate luminal irregularity of the anterior and middle cerebral arteries.  Posterior circulation: LEFT vertebral artery is dominant. Mid grade stenosis of RIGHT vertebral artery which predominately in terminates in the RIGHT posterior inferior cerebellar artery, with occluded distal thready RIGHT vertebral artery. Mild stenosis LEFT vertebral artery. Widely patent basilar artery. High-grade stenosis suspected of RIGHT superior cerebellar artery origin with moderate luminal irregularity of the RIGHT greater than LEFT superior cerebellar arterys. RIGHT vertebral artery terminates in the posterior inferior cerebellar artery. Patent posterior cerebral arteries with multifocal mid high-grade stenosis of the LEFT P2 segment. High-grade stenosis of RIGHT P1 segment with thready RIGHT posterior communicating artery present, moderate luminal regularity of the mid to distal RIGHT posterior cerebral artery.  No large vessel occlusion, dissection, luminal  irregularity, contrast extravasation or aneurysm within the anterior nor posterior circulation.  IMPRESSION: CT HEAD: Evolving RIGHT superior cerebellar artery territory infarct.  Remote RIGHT basal ganglion and bilateral thalamus lacunar infarcts. Mild white matter changes suggest chronic small vessel ischemic disease.  CTA NECK: Approximately 60% stenosis RIGHT internal carotid artery by NASCET criteria. No large vessel occlusion.  CTA HEAD: Mid grade stenosis of RIGHT anterior genu of the carotid siphon, suspected occlusion of RIGHT A1 segment with patent A-comm artery. High-grade stenosis of proximal RIGHT M2 branch.  Mid grade stenosis of RIGHT vertebral artery, which predominately at terminates in the RIGHT pack of, and included distal thready RIGHT vertebral artery. Suspected high-grade stenosis of the RIGHT superior cerebellar artery origin. Multifocal high-grade stenosis of LEFT P2 segment. High-grade stenosis of RIGHT P1 segment with RIGHT posterior communicating artery present.  Moderate luminal regularity of the cerebral arteries most consistent with atherosclerosis.   Electronically Signed   By: Elon Alas   On: 04/19/2014 04:03   Ct Angio Neck W/cm &/or Wo/cm  04/19/2014   CLINICAL DATA:  Slurred speech and RIGHT-sided weakness for 2 weeks, worsening speech difficulties today. History  of hypertension, diabetes, hyperlipidemia, sarcoma.  EXAM: CT ANGIOGRAPHY HEAD AND NECK  TECHNIQUE: Multidetector CT imaging of the head and neck was performed using the standard protocol during bolus administration of intravenous contrast. Multiplanar CT image reconstructions and MIPs were obtained to evaluate the vascular anatomy. Carotid stenosis measurements (when applicable) are obtained utilizing NASCET criteria, using the distal internal carotid diameter as the denominator.  CONTRAST:  75mL OMNIPAQUE IOHEXOL 350 MG/ML SOLN  COMPARISON:  MRI of the head April 16 2014  FINDINGS: CT HEAD  The ventricles and  sulci are normal for age. No intraparenchymal hemorrhage, mass effect nor midline shift. Patchy supratentorial white matter hypodensities are within normal range for patient's age and though non-specific suggest sequelae of chronic small vessel ischemic disease. Wedge-like hypodensity RIGHT mid to superior cerebellum. RIGHT basal ganglia lacunar infarct was present previously, similar appearance of bilateral thalamus small lacunar infarcts. No abnormal intracranial enhancement.  No abnormal extra-axial fluid collections. Basal cisterns are patent. Minimal calcific atherosclerosis of the carotid siphons.  No skull fracture. Persistent metopic suture. The included ocular globes and orbital contents are non-suspicious. The mastoid aircells and included paranasal sinuses are well-aerated. Patient is edentulous.  CTA NECK  Normal appearance of the thoracic arch, normal branch pattern. The origins of the innominate, left Common carotid artery and subclavian artery are widely patent, intimal thickening partially obscured by streak artifact from retained LEFT subclavian venous contrast.  Bilateral Common carotid arteries are widely patent, coursing in a straight line fashion. Calcific atherosclerosis and eccentric intimal hematoma results in approximately 60% narrowing of RIGHT internal carotid artery by NASCET criteria, approximate 13 mm segment of stenosis beginning at the origin. Normal appearance of the mid to distal included internal carotid arteries.  Reflux of contrast limits evaluation the vertebral artery origins. Left vertebral artery is dominant. Suspected mid to high-grade stenosis the origin of LEFT vertebral artery with mild poststenotic dilatation.  No dissection, no pseudoaneurysm. No abnormal luminal irregularity. No contrast extravasation.  Soft tissues are unremarkable. No acute osseous process though bone windows have not been submitted.  CTA HEAD  Anterior circulation: Patent cervical internal carotid  arteries, petrous, cavernous and supra clinoid internal carotid arteries. Intimal thickening results in at least mid grade stenosis anterior genu of the RIGHT carotid siphon. Widely patent anterior communicating artery. Congenitally dominant LEFT A1 segment. Suspected occlusion of RIGHT A1 segment. High-grade stenosis of proximal RIGHT M2 branch. Moderate luminal irregularity of the anterior and middle cerebral arteries.  Posterior circulation: LEFT vertebral artery is dominant. Mid grade stenosis of RIGHT vertebral artery which predominately in terminates in the RIGHT posterior inferior cerebellar artery, with occluded distal thready RIGHT vertebral artery. Mild stenosis LEFT vertebral artery. Widely patent basilar artery. High-grade stenosis suspected of RIGHT superior cerebellar artery origin with moderate luminal irregularity of the RIGHT greater than LEFT superior cerebellar arterys. RIGHT vertebral artery terminates in the posterior inferior cerebellar artery. Patent posterior cerebral arteries with multifocal mid high-grade stenosis of the LEFT P2 segment. High-grade stenosis of RIGHT P1 segment with thready RIGHT posterior communicating artery present, moderate luminal regularity of the mid to distal RIGHT posterior cerebral artery.  No large vessel occlusion, dissection, luminal irregularity, contrast extravasation or aneurysm within the anterior nor posterior circulation.  IMPRESSION: CT HEAD: Evolving RIGHT superior cerebellar artery territory infarct.  Remote RIGHT basal ganglion and bilateral thalamus lacunar infarcts. Mild white matter changes suggest chronic small vessel ischemic disease.  CTA NECK: Approximately 60% stenosis RIGHT internal carotid artery by NASCET criteria. No  large vessel occlusion.  CTA HEAD: Mid grade stenosis of RIGHT anterior genu of the carotid siphon, suspected occlusion of RIGHT A1 segment with patent A-comm artery. High-grade stenosis of proximal RIGHT M2 branch.  Mid grade  stenosis of RIGHT vertebral artery, which predominately at terminates in the RIGHT pack of, and included distal thready RIGHT vertebral artery. Suspected high-grade stenosis of the RIGHT superior cerebellar artery origin. Multifocal high-grade stenosis of LEFT P2 segment. High-grade stenosis of RIGHT P1 segment with RIGHT posterior communicating artery present.  Moderate luminal regularity of the cerebral arteries most consistent with atherosclerosis.   Electronically Signed   By: Elon Alas   On: 04/19/2014 04:03    Scheduled Meds: . aspirin  300 mg Rectal Daily   Or  . aspirin  325 mg Oral Daily  . atorvastatin  40 mg Oral q1800  . carvedilol  6.25 mg Oral BID WC  . enoxaparin (LOVENOX) injection  40 mg Subcutaneous Q24H  . insulin aspart  0-5 Units Subcutaneous QHS  . insulin aspart  0-9 Units Subcutaneous TID WC  . isosorbide-hydrALAZINE  1 tablet Oral TID   Continuous Infusions: . sodium chloride 50 mL/hr at 04/16/14 2146    Active Problems:   Type II diabetes mellitus with neurological manifestations   HTN (hypertension)   Hyperlipidemia   Stroke   CVA (cerebral infarction)   Speech abnormality    Time spent: 30 min    Hayden Mckinney, [facility] Pager [phone]. If 7PM-7AM, please contact night-coverage at [email or web address], password [facility] 04/19/2014, 8:51 AM  LOS: 3 days

## 2014-04-19 NOTE — Consult Note (Signed)
ELECTROPHYSIOLOGY CONSULT NOTE   Patient ID: Hayden Mckinney MRN: [id number], DOB/AGE: [birth year]   Admit date: 04/16/2014 Date of Consult: 04/19/2014   Primary Physician: No primary care provider on file. Primary Cardiologist: new - seen by G. Lovena Le, MD   Pt. Profile  69 y/o male without prior cardiac history who was admitted 4/8 with weakness and falls and found to have a right cerebellar infarct and multiple remote infarcts whom we've been asked to eval for TEE and Loop recorder placement to assess for cardioembolic source.  Problem List  Past Medical History  Diagnosis Date  . Type II diabetes mellitus   . Sarcoma     a. R leg  . Diabetic peripheral neuropathy     a. both feet  . TIA (transient ischemic attack)     a. 08/2010.  Marland Kitchen Hyperlipidemia   . Hypertension   . Stroke     a. 04/2014 multiple bateral infarcts, presumed to be embolic;  b. [id number] Carotid U/S: 1-39% bilat ICA stenosis.  . H/O echocardiogram     a. 04/2014 Echo: EF 55-60%, no rwma.  Marland Kitchen ETOH abuse     a. 04/2014 18-24 beers q weekend.    Past Surgical History  Procedure Laterality Date  . Sarcoma removal    . Repair of r hand after trauma       Allergies  No Known Allergies  HPI   69 y/o male with a h/o HTN, HL, DM, and TIA.  He has no prior cardiac hx.  He was in his usual state of health until approximately 2 wks prior to admission when he began to experience increasing weakness, slurring of speech, and falls.  His sister called him on 4/8 and noted his speech and called EMS.  He was taken into the [facility] ED where head CT was notable for a 1.7 cm focal hypodensity over the right basal ganglia/internal capsule, representing a subacute to chronic infarct.  Several other, smaller bilateral old basal ganglia lacunar infarcts were noted as well.  He was admitted and seen by neurology.  F/U MRI confirmed a moderate-size acute to subacute nonhemorrhagic infarct of the mid to superior right cerebelum with local  mass effect.  He was also noted to have multiple bilateral remote infarcts.  CTA head/neck showed mod to severe cerebrovascular disease including high grade dzs involving the Right A1 segment, R superior cerebellar artery, Left P2 segment, and Right P1 segment.  Carotid u/s showed bilateral 1-39% stenoses.  2D echo showed nl EF w/o regional wma's.  Tele has shown sinus rhythm to sinus tachycardia without evidence of atrial arrhythmias.  Given findings on MRI of remote bilateral strokes, we have been consulted to perform TEE to r/o cardioembolic source of CVA and subsequent Linq placement.  He denies chest pain, palpitations, dyspnea, pnd, orthopnea, n, v, dizziness, syncope, edema, weight gain, or early satiety.   Inpatient Medications  . aspirin  300 mg Rectal Daily   Or  . aspirin  325 mg Oral Daily  . atorvastatin  40 mg Oral q1800  . carvedilol  6.25 mg Oral BID WC  . enoxaparin (LOVENOX) injection  40 mg Subcutaneous Q24H  . insulin aspart  0-5 Units Subcutaneous QHS  . insulin aspart  0-9 Units Subcutaneous TID WC  . isosorbide-hydrALAZINE  1 tablet Oral TID    Family History Family History  Problem Relation Age of Onset  . Cancer Mother   . Cancer Father   . Diabetes Brother  Social History History   Social History  . Marital Status: Widowed    Spouse Name: N/A  . Number of Children: N/A  . Years of Education: N/A   Occupational History  . [employer]     retired   Social History Main Topics  . Smoking status: Never Smoker   . Smokeless tobacco: Never Used  . Alcohol Use: 3.6 oz/week    6 Standard drinks or equivalent per week     Comment: 18-24 beers every weekend.  . Drug Use: No  . Sexual Activity: Not on file   Other Topics Concern  . Not on file   Social History Narrative   Lives with a room mate in [place].  Sedentary.  Drinks between 18 and 24 beers every weekend.     Review of Systems  General:  No chills, fever, night sweats or weight  changes.  Cardiovascular:  No chest pain, dyspnea on exertion, edema, orthopnea, palpitations, paroxysmal nocturnal dyspnea. Dermatological: No rash, lesions/masses Respiratory: No cough, dyspnea Urologic: No hematuria, dysuria Abdominal:   No nausea, vomiting, diarrhea, bright red blood per rectum, melena, or hematemesis Neurologic:  No visual changes, wkns, changes in mental status. All other systems reviewed and are otherwise negative except as noted above.  Physical Exam  Blood pressure 132/65, pulse 67, temperature 98 F (36.7 C), temperature source Oral, resp. rate 18, height 5\' 10"  (1.778 m), weight 147 lb 14.9 oz (67.1 kg), SpO2 100 %.  General: Pleasant, NAD Psych: Normal affect. Neuro: Alert and oriented X 3. Moves all extremities spontaneously. Dysarthria. HEENT: Normal  Neck: Supple without bruits or JVD. Lungs:  Resp regular and unlabored, CTA. Heart: RRR no s3, s4, or murmurs. Abdomen: Soft, non-tender, non-distended, BS + x 4.  Extremities: No clubbing, cyanosis or edema. DP/PT/Radials 2+ and equal bilaterally.  Labs  Lab Results  Component Value Date   WBC 9.0 04/16/2014   HGB 13.3 04/16/2014   HCT 40.2 04/16/2014   MCV 83.8 04/16/2014   PLT 287 04/16/2014     Recent Labs Lab 04/19/14 0658  NA 137  K 3.7  CL 109  CO2 25  BUN 24*  CREATININE 1.36*  CALCIUM 8.4  GLUCOSE 167*   Lab Results  Component Value Date   CHOL 254* 04/17/2014   HDL 37* 04/17/2014   LDLCALC 162* 04/17/2014   TRIG 276* 04/17/2014   Radiology/Studies  Ct Head Wo Contrast  04/16/2014   CLINICAL DATA:  Right-sided weakness with slurred speech.  EXAM: CT HEAD WITHOUT CONTRAST   IMPRESSION: 1.7 cm focal hypodensity over the right basal ganglia/internal capsule which may be a subacute to chronic infarct. Several other smaller bilateral old basal ganglia lacunar infarcts.  Chronic ischemic microvascular disease.   Electronically Signed   By: Marin Olp M.D.   On: 04/16/2014 15:37    _____________   Ct Angio Neck W/cm &/or Wo/cm  04/19/2014   CLINICAL DATA:  Slurred speech and RIGHT-sided weakness for 2 weeks, worsening speech difficulties today. History of hypertension, diabetes, hyperlipidemia, sarcoma.  EXAM: CT ANGIOGRAPHY HEAD AND NECK   IMPRESSION: CT HEAD: Evolving RIGHT superior cerebellar artery territory infarct.  Remote RIGHT basal ganglion and bilateral thalamus lacunar infarcts. Mild white matter changes suggest chronic small vessel ischemic disease.  CTA NECK: Approximately 60% stenosis RIGHT internal carotid artery by NASCET criteria. No large vessel occlusion.  CTA HEAD: Mid grade stenosis of RIGHT anterior genu of the carotid siphon, suspected occlusion of RIGHT A1 segment with  patent A-comm artery. High-grade stenosis of proximal RIGHT M2 branch.  Mid grade stenosis of RIGHT vertebral artery, which predominately at terminates in the RIGHT pack of, and included distal thready RIGHT vertebral artery. Suspected high-grade stenosis of the RIGHT superior cerebellar artery origin. Multifocal high-grade stenosis of LEFT P2 segment. High-grade stenosis of RIGHT P1 segment with RIGHT posterior communicating artery present.  Moderate luminal regularity of the cerebral arteries most consistent with atherosclerosis.   Electronically Signed   By: Elon Alas   On: 04/19/2014 04:03  _____________   [name] Contrast  04/16/2014   CLINICAL DATA:  69 year old diabetic male with prior TIA now presenting with slurred speech and right-sided weakness for 2 weeks. History sarcoma. Initial encounter.  EXAM: MRI HEAD WITHOUT CONTRAST  IMPRESSION: Moderate-size acute/ subacute nonhemorrhagic infarct mid to superior right cerebellar with local mass effect.  Tiny acute nonhemorrhagic posterior right operculum infarct.  Subacute to remote partially hemorrhagic infarct right lenticular nucleus/mid right coronal radiata.  Remote bilateral thalamic infarcts.  Remote infarct anterior left  external capsule.  Partially hemorrhagic remote left caudate/anterior limb left duct internal capsule anterior left lenticular nucleus infarct.  Moderate small vessel disease type changes.  Small right vertebral artery may end predominantly in a posterior inferior cerebellar artery distribution. Left vertebral artery, basilar artery and internal carotid arteries appear patent.   Electronically Signed   By: Genia Del M.D.   On: 04/16/2014 19:00   Carotid U/S  04/18/2014  Bilateral carotid artery duplex completed:  1-39% ICA stenosis.  Vertebral artery flow is antegrade. _____________   2D Echocardiogram   4.9.2016  Study Conclusions  - Left ventricle: The cavity size was normal. Systolic function was   normal. The estimated ejection fraction was in the range of 55%   to 60%. Wall motion was normal; there were no regional wall   motion abnormalities. _____________   ECG  RSR, 80, 1st deg avb, rbbb, lad, lafb.  ASSESSMENT AND PLAN  1.  Embolic CVA:  Pt presented with a 2 wk h/o wkns, falls, and slurring of speech.  He has been found to have an acute to subacute right cerebellar infarct with multiple bilateral remote infarcts and moderate to severe cerebrovascular disease.  Echo showed nl LV fxn.  Tele shows sinus rhythm.  No prior h/o atrial arrhythmias.  We will arrange for TEE tomorrow as schedule will not permit for completion today. After careful review of history and examination, the risks and benefits of transesophageal echocardiogram have been explained including risks of esophageal damage, perforation (1:10,000 risk), bleeding, pharyngeal hematoma as well as other potential complications associated with conscious sedation including aspiration, arrhythmia, respiratory failure and death. Alternatives to treatment were discussed, questions were answered. Patient is willing to proceed.  Following TEE, provided LA/LAA thrombus not found, we will then proceed with Linq placement.  2.   HTN:  On bb.  Stable.  3.  HL:  LDL 162.  Now on statin.  4. DM:  Per IM.  5.  ETOH abuse:  Drinking 18-24 beers every weekend.  Complete cessation advised.  Hayden Hodgkins, NP 04/19/2014 9:41 AM     Signed, Hayden Hodgkins, NP 04/19/2014, 9:41 AM   I have seen, examined the patient, and reviewed the above assessment and plan.  Changes to above are made where necessary.  The patient presents with cryptogenic stroke.  The patient has a TEE planned for this AM.  I spoke at length with the patient about monitoring for afib  with either a 30 day event monitor or an implantable loop recorder.  Risks, benefits, and alteratives to implantable loop recorder were discussed with the patient today.   At this time, the patient is very clear in their decision to proceed with implantable loop recorder.   Please call with questions.   Co Sign: Thompson Grayer, MD 04/20/2014 10:23 AM

## 2014-04-19 NOTE — Progress Notes (Signed)
VASCULAR LAB PRELIMINARY  PRELIMINARY  PRELIMINARY  PRELIMINARY  Bilateral lower extremity venous Dopplers completed.    Preliminary report:  There is no DVT or SVT noted in the bilateral lower extremities.   Karrissa Parchment, RVT 04/19/2014, 11:09 AM

## 2014-04-19 NOTE — Progress Notes (Signed)
STROKE TEAM PROGRESS NOTE   HISTORY Hayden Mckinney is a 69 y.o. male hx of HTN, HLD, DM, prior TIA presenting with slurred speech and right sided weakness x 2 weeks. His sister visited him today and was unable to understand him so she convinced him to come to the hospital for evaluation. He also notes his right sided weakness has worsened in the past few days. He takes an ASA 81mg  daily at home. MRI brain completed in ED, imaging reviewed. Shows acute infarcts in mid to superior right cerebellum, and small acute infarct in right operculum. Multiple old infarcts also noted. His last known well is unclear, > 2weeks ago. tPA was not given as outside treatment window.   SUBJECTIVE (INTERVAL HISTORY) No family members present. He is waiting for TEE but was told to rescheduled to tomorrow. Still has right side ataxia.    OBJECTIVE Temp:  [97.5 F (36.4 C)-99.7 F (37.6 C)] 99.7 F (37.6 C) (04/11 2147) Pulse Rate:  [61-72] 72 (04/11 2147) Cardiac Rhythm:  [-] Normal sinus rhythm (04/11 2038) Resp:  [17-20] 20 (04/11 2147) BP: (122-152)/(53-65) 152/62 mmHg (04/11 2147) SpO2:  [98 %-100 %] 98 % (04/11 2147)   Recent Labs Lab 04/18/14 2158 04/19/14 0710 04/19/14 1124 04/19/14 1641 04/19/14 2150  GLUCAP 182* 144* 147* 162* 188*    Recent Labs Lab 04/16/14 1432 04/17/14 1543 04/18/14 0619 04/19/14 0658  NA 139 139 140 137  K 4.0 3.5 3.5 3.7  CL 104 104 107 109  CO2 26 26 26 25   GLUCOSE 209* 201* 150* 167*  BUN 33* 25* 21 24*  CREATININE 1.81* 1.47* 1.19 1.36*  CALCIUM 9.3 8.5 8.8 8.4   No results for input(s): AST, ALT, ALKPHOS, BILITOT, PROT, ALBUMIN in the last 168 hours.  Recent Labs Lab 04/16/14 1432  WBC 9.0  HGB 13.3  HCT 40.2  MCV 83.8  PLT 287   No results for input(s): CKTOTAL, CKMB, CKMBINDEX, TROPONINI in the last 168 hours. No results for input(s): LABPROT, INR in the last 72 hours. No results for input(s): COLORURINE, LABSPEC, [place], GLUCOSEU,  HGBUR, BILIRUBINUR, KETONESUR, PROTEINUR, UROBILINOGEN, NITRITE, LEUKOCYTESUR in the last 72 hours.  Invalid input(s): APPERANCEUR     Component Value Date/Time   CHOL 254* 04/17/2014 1543   TRIG 276* 04/17/2014 1543   HDL 37* 04/17/2014 1543   CHOLHDL 6.9 04/17/2014 1543   VLDL 55* 04/17/2014 1543   LDLCALC 162* 04/17/2014 1543   Lab Results  Component Value Date   HGBA1C 7.9 12/14/2011   No results found for: LABOPIA, COCAINSCRNUR, LABBENZ, AMPHETMU, THCU, LABBARB  No results for input(s): ETH in the last 168 hours.  I have personally reviewed the radiological images below and agree with the radiology interpretations.  CT HEAD  04/19/2014   Evolving RIGHT superior cerebellar artery territory infarct.  Remote RIGHT basal ganglion and bilateral thalamus lacunar infarcts. Mild white matter changes suggest chronic small vessel ischemic disease.  04/16/2014   1.7 cm focal hypodensity over the right basal ganglia/internal capsule which may be a subacute to chronic infarct. Several other smaller bilateral old basal ganglia lacunar infarcts.  Chronic ischemic microvascular disease.     CTA NECK 04/19/2014   Approximately 60% stenosis RIGHT internal carotid artery by NASCET criteria. No large vessel occlusion.    CTA HEAD 04/19/2014   Mid grade stenosis of RIGHT anterior genu of the carotid siphon, suspected occlusion of RIGHT A1 segment with patent A-comm artery. High-grade stenosis of proximal RIGHT M2 branch.  Mid grade stenosis of RIGHT vertebral artery, which predominately at terminates in the RIGHT pack of, and included distal thready RIGHT vertebral artery. Suspected high-grade stenosis of the RIGHT superior cerebellar artery origin. Multifocal high-grade stenosis of LEFT P2 segment. High-grade stenosis of RIGHT P1 segment with RIGHT posterior communicating artery present.  Moderate luminal regularity of the cerebral arteries most consistent with atherosclerosis.     [name]  Contrast 04/16/2014    Moderate-size acute/ subacute nonhemorrhagic infarct mid to superior right cerebellar with local mass effect.  Tiny acute nonhemorrhagic posterior right operculum infarct.  Subacute to remote partially hemorrhagic infarct right lenticular nucleus/mid right coronal radiata.  Remote bilateral thalamic infarcts.  Remote infarct anterior left external capsule.  Partially hemorrhagic remote left caudate/anterior limb left duct internal capsule anterior left lenticular nucleus infarct.  Moderate small vessel disease type changes.  Small right vertebral artery may end predominantly in a posterior inferior cerebellar artery distribution. Left vertebral artery, basilar artery and internal carotid arteries appear patent.     Carotid Doppler  There is 1-39% bilateral ICA stenosis. Vertebral artery flow is antegrade.    Bilateral Lower Extremity Venous Dopplers There is no DVT or SVT noted in the bilateral lower extremities.   2D echo - Left ventricle: The cavity size was normal. Systolic function was normal. The estimated ejection fraction was in the range of 55% to 60%. Wall motion was normal; there were no regional wall motion abnormalities.  PHYSICAL EXAM  Temp:  [97.5 F (36.4 C)-99.7 F (37.6 C)] 99.7 F (37.6 C) (04/11 2147) Pulse Rate:  [61-72] 72 (04/11 2147) Resp:  [17-20] 20 (04/11 2147) BP: (122-152)/(53-65) 152/62 mmHg (04/11 2147) SpO2:  [98 %-100 %] 98 % (04/11 2147)  General - Well nourished, well developed, in no apparent distress.  Ophthalmologic - Sharp disc margins OU.  Cardiovascular - Regular rate and rhythm with no murmur.  Mental Status -  Level of arousal and orientation to time, place, and person were intact. Language including expression, naming, repetition, comprehension was assessed and found intact, but significant dysarthria.  Cranial Nerves II - XII - II - Visual field intact OU. III, IV, VI - Extraocular movements intact. V - Facial  sensation intact bilaterally. VII - Facial movement intact bilaterally. VIII - Hearing & vestibular intact bilaterally, no nystagmus. X - Palate elevates symmetrically, significant dysarthria. XI - Chin turning & shoulder shrug intact bilaterally. XII - Tongue protrusion intact.  Motor Strength - The patient's strength was RUE 5/5 proximal and 5-/5 distal, bilateral LE 2/5 dorsiflexion, but 5/5 plantarflexion. Pronator drift was absent.  Bulk was normal and fasciculations were absent.   Motor Tone - Muscle tone was assessed at the neck and appendages and was normal.  Reflexes - The patient's reflexes were 1+ in all extremities and he had no pathological reflexes.  Sensory - Light touch, temperature/pinprickwere assessed and were symmetrical.    Coordination - The patient had right UE FTN and right LE HTS ataxia/dysmetria.  Tremor was absent.  Gait and Station - not tested.   ASSESSMENT/PLAN Mr. ULISES BELLS is a 69 y.o. male with history of hypertension, hyperlipidemia, diabetes mellitus, previous strokes and TIAs presenting with slurred speech and right hemiparesis.  He did not receive IV t-PA due to late presentation.  Strokes:  multifocal ischemic infarcts, felt to be embolic in setting of significant intracranial atherosclerosis, unknown source.   Resultant  Dysarthria, right ataxia, dysphagia  MRI  R superior cerebellar and right opercular acute  infarcts. Subacute right lenticular/corona radiata infarct. Old bilateral thalamic infarcts  CTA head and neck scattered atherosclerosis with high-grade stenosis/occlusion right A1, right M2 right vertebral artery, right superior cerebellar artery, left P2, right P1  Carotid Doppler  No significant stenosis  2D Echo - unremarkable  LE dopplers no DVT  TEE scheduled for tomorrow at 11a. If no PFO, cardiology will consult and consider loop placement to rule out atrial fibrillation as source of stroke  LDL 172, not at goal  A1C  pending  Lovenox for VTE prophylaxis DIET DYS 3 Room service appropriate?: Yes; Fluid consistency:: Thin   aspirin 81 mg orally every day prior to admission, now on aspirin 300 mg suppository daily. Given Given large vessel intracranial atherosclerosis, patient should be treated with aspirin 81 mg and clopidogrel 75 mg orally every day x 3 months for secondary stroke prevention. After 3 months, change to plavix alone. Long-term dual antiplatelets are contraindicated due to risk for intracerebral hemorrhage.  Ongoing aggressive stroke risk factor management  Therapy recommendations:  Inpatient rehabilitation recommended. Rehabilitation consult pending.  Disposition:  Pending  Hypertension  Home meds: Lisinopril/hydrochlorothiazide  BP should be gradually normalize within 5-7 days  On coreg now.  BP stable  Hyperlipidemia  Home meds:  Pravachol 80 mg daily not resumed in hospital   LDL 162 in , goal < 70  Changed to Lipitor 40  Continue statin at discharge  Diabetes type II  HgbA1c 7.9, goal < 7.0  Controlled  SSI  Other Stroke Risk Factors  Advanced age  ETOH use  Hx stroke/TIA  Other Active Problems  Renal insufficiency - resolved  Hospital day # [place] [facility] for Pager information 04/19/2014 2:27 PM   I, the attending vascular neurologist, have personally obtained a history, examined the patient, evaluated laboratory data, individually viewed imaging studies and agree with radiology interpretations. I also discussed with Dr. Sheran Fava regarding his care plan. Together with the NP/PA, we formulated the assessment and plan of care which reflects our mutual decision.  I have made any additions or clarifications directly to the above note and agree with the findings and plan as currently documented.   69 yo M with multiple stroke risk factors was admitted for right cerebellar stroke with right MCA punctate acute infarct. Although  he shas HTN, HLD and DM as stroke factor, the pattern can not exclude from embolic pattern. He needs aggressive risk factor modification as well as TEE and loop monitoring. Recommend dural antiplatelet therapy. Continue statin. Will follow.  Rosalin Hawking, MD PhD Stroke Neurology 04/19/2014 10:36 PM       To contact Stroke Continuity provider, please refer to [email or web address] After hours, contact General Neurology

## 2014-04-19 NOTE — Progress Notes (Signed)
CARE MANAGEMENT NOTE 04/19/2014  Patient:  Hayden Mckinney, Hayden Mckinney   Account Number:  [id number]  Date Initiated:  04/19/2014  Documentation initiated by:  Lorne Skeens  Subjective/Objective Assessment:   Patient was admitted with CVA. Lives at home with friends.     Action/Plan:   Will follow for discharge needs pending PT/OT evals and physician orders.   Anticipated DC Date:     Anticipated DC Plan:  IP REHAB FACILITY         Choice offered to / List presented to:             Status of service:  In process, will continue to follow Medicare Important Message given?  YES (If response is "NO", the following Medicare IM given date fields will be blank) Date Medicare IM given:  04/19/2014 Medicare IM given by:  Lorne Skeens Date Additional Medicare IM given:   Additional Medicare IM given by:    Discharge Disposition:    Per UR Regulation:  Reviewed for med. necessity/level of care/duration of stay  If discussed at [place] of Stay Meetings, dates discussed:    Comments:  04/19/14 [place], MSN, CM- Medicare IM letter provided.

## 2014-04-20 ENCOUNTER — Inpatient Hospital Stay (HOSPITAL_COMMUNITY): Payer: Medicare Other

## 2014-04-20 ENCOUNTER — Encounter (HOSPITAL_COMMUNITY): Payer: Self-pay

## 2014-04-20 ENCOUNTER — Encounter (HOSPITAL_COMMUNITY)
Admission: EM | Disposition: A | Discharge status: Rehabilitation Facility | Payer: Self-pay | Source: Home / Self Care | Attending: Internal Medicine

## 2014-04-20 DIAGNOSIS — I639 Cerebral infarction, unspecified: Secondary | ICD-10-CM

## 2014-04-20 DIAGNOSIS — I10 Essential (primary) hypertension: Secondary | ICD-10-CM

## 2014-04-20 DIAGNOSIS — N179 Acute kidney failure, unspecified: Secondary | ICD-10-CM

## 2014-04-20 DIAGNOSIS — J690 Pneumonitis due to inhalation of food and vomit: Secondary | ICD-10-CM

## 2014-04-20 DIAGNOSIS — I6340 Cerebral infarction due to embolism of unspecified cerebral artery: Principal | ICD-10-CM

## 2014-04-20 HISTORY — PX: LOOP RECORDER IMPLANT: SHX5477

## 2014-04-20 HISTORY — PX: TEE WITHOUT CARDIOVERSION: SHX5443

## 2014-04-20 LAB — GLUCOSE, CAPILLARY
GLUCOSE-CAPILLARY: 120 mg/dL — AB (ref 70–99)
Glucose-Capillary: 167 mg/dL — ABNORMAL HIGH (ref 70–99)
Glucose-Capillary: 296 mg/dL — ABNORMAL HIGH (ref 70–99)
Glucose-Capillary: 167 mg/dL — ABNORMAL HIGH (ref 70–99)

## 2014-04-20 LAB — CBC WITH DIFFERENTIAL/PLATELET
BASOS ABS: 0 10*3/uL (ref 0.0–0.1)
BASOS PCT: 1 % (ref 0–1)
EOS PCT: 2 % (ref 0–5)
Eosinophils Absolute: 0.1 10*3/uL (ref 0.0–0.7)
HEMATOCRIT: 36.4 % — AB (ref 39.0–52.0)
Hemoglobin: 12 g/dL — ABNORMAL LOW (ref 13.0–17.0)
Lymphocytes Relative: 31 % (ref 12–46)
Lymphs Abs: 1.8 10*3/uL (ref 0.7–4.0)
MCH: 27.8 pg (ref 26.0–34.0)
MCHC: 33 g/dL (ref 30.0–36.0)
MCV: 84.5 fL (ref 78.0–100.0)
MONO ABS: 0.5 10*3/uL (ref 0.1–1.0)
Monocytes Relative: 8 % (ref 3–12)
NEUTROS ABS: 3.5 10*3/uL (ref 1.7–7.7)
Neutrophils Relative %: 58 % (ref 43–77)
Platelets: 232 10*3/uL (ref 150–400)
RBC: 4.31 MIL/uL (ref 4.22–5.81)
RDW: 13 % (ref 11.5–15.5)
WBC: 5.9 10*3/uL (ref 4.0–10.5)

## 2014-04-20 LAB — BASIC METABOLIC PANEL
Anion gap: 4 — ABNORMAL LOW (ref 5–15)
BUN: 26 mg/dL — AB (ref 6–23)
CALCIUM: 8.8 mg/dL (ref 8.4–10.5)
CO2: 27 mmol/L (ref 19–32)
CREATININE: 1.55 mg/dL — AB (ref 0.50–1.35)
Chloride: 110 mmol/L (ref 96–112)
GFR calc Af Amer: 51 mL/min — ABNORMAL LOW (ref >90)
GFR calc non Af Amer: 44 mL/min — ABNORMAL LOW (ref >90)
Glucose, Bld: 179 mg/dL — ABNORMAL HIGH (ref 70–99)
Potassium: 3.6 mmol/L (ref 3.5–5.1)
Sodium: 141 mmol/L (ref 135–145)

## 2014-04-20 LAB — URINE MICROSCOPIC-ADD ON

## 2014-04-20 LAB — STREP PNEUMONIAE URINARY ANTIGEN: Strep Pneumo Urinary Antigen: NEGATIVE

## 2014-04-20 LAB — URINALYSIS, ROUTINE W REFLEX MICROSCOPIC
Bilirubin Urine: NEGATIVE
GLUCOSE, UA: 250 mg/dL — AB
Hgb urine dipstick: NEGATIVE
Ketones, ur: NEGATIVE mg/dL
LEUKOCYTES UA: NEGATIVE
Nitrite: NEGATIVE
Specific Gravity, Urine: 1.029 (ref 1.005–1.030)
UROBILINOGEN UA: 0.2 mg/dL (ref 0.0–1.0)
pH: 5 (ref 5.0–8.0)

## 2014-04-20 LAB — CREATININE, URINE, RANDOM: Creatinine, Urine: 183.3 mg/dL

## 2014-04-20 LAB — SODIUM, URINE, RANDOM: SODIUM UR: 73 mmol/L

## 2014-04-20 SURGERY — LOOP RECORDER IMPLANT
Anesthesia: LOCAL

## 2014-04-20 SURGERY — ECHOCARDIOGRAM, TRANSESOPHAGEAL
Anesthesia: Moderate Sedation

## 2014-04-20 MED ORDER — MIDAZOLAM HCL 5 MG/ML IJ SOLN
INTRAMUSCULAR | Status: AC
  Filled 2014-04-20: qty 2

## 2014-04-20 MED ORDER — BUTAMBEN-TETRACAINE-BENZOCAINE 2-2-14 % EX AERO
INHALATION_SPRAY | CUTANEOUS | Status: DC | PRN
  Administered 2014-04-20: 2 via TOPICAL

## 2014-04-20 MED ORDER — SODIUM CHLORIDE 0.9 % IV SOLN
INTRAVENOUS | Status: DC
  Administered 2014-04-21: 10:00:00 via INTRAVENOUS

## 2014-04-20 MED ORDER — LIDOCAINE-EPINEPHRINE 1 %-1:100000 IJ SOLN
INTRAMUSCULAR | Status: AC
  Filled 2014-04-20: qty 1

## 2014-04-20 MED ORDER — MIDAZOLAM HCL 10 MG/2ML IJ SOLN
INTRAMUSCULAR | Status: DC | PRN
  Administered 2014-04-20: 2 mg via INTRAVENOUS

## 2014-04-20 MED ORDER — FENTANYL CITRATE 0.05 MG/ML IJ SOLN
INTRAMUSCULAR | Status: AC
  Filled 2014-04-20: qty 2

## 2014-04-20 MED ORDER — DIPHENHYDRAMINE HCL 50 MG/ML IJ SOLN
INTRAMUSCULAR | Status: AC
  Filled 2014-04-20: qty 1

## 2014-04-20 MED ORDER — FENTANYL CITRATE 0.05 MG/ML IJ SOLN
INTRAMUSCULAR | Status: DC | PRN
  Administered 2014-04-20: 25 ug via INTRAVENOUS

## 2014-04-20 MED ORDER — SODIUM CHLORIDE 0.9 % IV SOLN
3.0000 g | Freq: Three times a day (TID) | INTRAVENOUS | Status: DC
  Administered 2014-04-20 – 2014-04-21 (×4): 3 g via INTRAVENOUS
  Filled 2014-04-20 (×7): qty 3

## 2014-04-20 NOTE — Progress Notes (Signed)
Rehab admissions - i spoke with Dr. Sheran Fava.  We will hold on inpatient rehab admission today secondary to medical issues.  I will follow up in am for medical readiness for inpatient rehab admission.  Call me for questions.  [phone]

## 2014-04-20 NOTE — Progress Notes (Signed)
Patient arrived back to room from TEE. He appears alert, denied pain. No other distress noted. Will continue to monitor.   Ave Filter, RN

## 2014-04-20 NOTE — CV Procedure (Signed)
SURGEON:  Thompson Grayer, MD     PREPROCEDURE DIAGNOSIS:  Cryptogenic Stroke    POSTPROCEDURE DIAGNOSIS:  Cryptogenic Stroke     PROCEDURES:   1. Implantable loop recorder implantation    INTRODUCTION:  Hayden Mckinney is a 69 y.o. male with a history of unexplained stroke who presents today for implantable loop implantation.  The patient has had a cryptogenic stroke.  Despite an extensive workup by neurology, no reversible causes have been identified.  he has worn telemetry during which he did not have arrhythmias.  There is significant concern for possible atrial fibrillation as the cause for the patients stroke.  The patient therefore presents today for implantable loop implantation.     DESCRIPTION OF PROCEDURE:  Informed written consent was obtained, and the patient was brought to the electrophysiology lab in a fasting state.  The patient required no sedation for the procedure today.  Mapping over the patient's chest was performed by the EP lab staff to identify the area where electrograms were most prominent for ILR recording.  This area was found to be the left parasternal region over the 3rd-4th intercostal space. The patients left chest was therefore prepped and draped in the usual sterile fashion by the EP lab staff. The skin overlying the left parasternal region was infiltrated with lidocaine for local analgesia.  A 0.5-cm incision was made over the left parasternal region over the 3rd intercostal space.  A subcutaneous ILR pocket was fashioned using a combination of sharp and blunt dissection.  A Medtronic Reveal [place] model [id number] implantable loop recorder was then placed into the pocket  R waves were very prominent and measured 0.77mV. EBL<1 ml.  Steri- Strips and a sterile dressing were then applied.  There were no early apparent complications.     CONCLUSIONS:   1. Successful implantation of a Medtronic Reveal LINQ implantable loop recorder for cryptogenic stroke  2. No  early apparent complications.   Thompson Grayer MD, [facility] 04/20/2014 10:54 AM

## 2014-04-20 NOTE — Interval H&P Note (Signed)
History and Physical Interval Note:  04/20/2014 10:20 AM  Hayden Mckinney  has presented today for surgery, with the diagnosis of STROKE  The various methods of treatment have been discussed with the patient and family. After consideration of risks, benefits and other options for treatment, the patient has consented to  Procedure(s): TRANSESOPHAGEAL ECHOCARDIOGRAM (TEE) (N/A) as a surgical intervention .  The patient's history has been reviewed, patient examined, no change in status, stable for surgery.  I have reviewed the patient's chart and labs.  Questions were answered to the patient's satisfaction.     Vernis Eid [employer]

## 2014-04-20 NOTE — Progress Notes (Signed)
STROKE TEAM PROGRESS NOTE   HISTORY Hayden Mckinney is a 69 y.o. male hx of HTN, HLD, DM, prior TIA presenting with slurred speech and right sided weakness x 2 weeks. His sister visited him today and was unable to understand him so she convinced him to come to the hospital for evaluation. He also notes his right sided weakness has worsened in the past few days. He takes an ASA 81mg  daily at home. MRI brain completed in ED, imaging reviewed. Shows acute infarcts in mid to superior right cerebellum, and small acute infarct in right operculum. Multiple old infarcts also noted. His last known well is unclear, > 2weeks ago. tPA was not given as outside treatment window.   SUBJECTIVE (INTERVAL HISTORY) No family at bedside. He was just back from TEE. Loop recorder placed. No acute event overnight, patient waiting for CIR placement   OBJECTIVE Temp:  [98.1 F (36.7 C)-99.7 F (37.6 C)] 98.1 F (36.7 C) (04/12 1131) Pulse Rate:  [61-72] 62 (04/12 1131) Cardiac Rhythm:  [-] Normal sinus rhythm (04/11 2038) Resp:  [9-20] 10 (04/12 1055) BP: (116-152)/(52-72) 116/72 mmHg (04/12 1131) SpO2:  [95 %-100 %] 98 % (04/12 1131)   Recent Labs Lab 04/19/14 1124 04/19/14 1641 04/19/14 2150 04/20/14 0653 04/20/14 1145  GLUCAP 147* 162* 188* 167* 120*    Recent Labs Lab 04/16/14 1432 04/17/14 1543 04/18/14 0619 04/19/14 0658 04/20/14 0714  NA 139 139 140 137 141  K 4.0 3.5 3.5 3.7 3.6  CL 104 104 107 109 110  CO2 26 26 26 25 27   GLUCOSE 209* 201* 150* 167* 179*  BUN 33* 25* 21 24* 26*  CREATININE 1.81* 1.47* 1.19 1.36* 1.55*  CALCIUM 9.3 8.5 8.8 8.4 8.8   No results for input(s): AST, ALT, ALKPHOS, BILITOT, PROT, ALBUMIN in the last 168 hours.  Recent Labs Lab 04/16/14 1432  WBC 9.0  HGB 13.3  HCT 40.2  MCV 83.8  PLT 287   No results for input(s): CKTOTAL, CKMB, CKMBINDEX, TROPONINI in the last 168 hours. No results for input(s): LABPROT, INR in the last 72 hours. No results  for input(s): COLORURINE, LABSPEC, [place], GLUCOSEU, HGBUR, BILIRUBINUR, KETONESUR, PROTEINUR, UROBILINOGEN, NITRITE, LEUKOCYTESUR in the last 72 hours.  Invalid input(s): APPERANCEUR     Component Value Date/Time   CHOL 254* 04/17/2014 1543   TRIG 276* 04/17/2014 1543   HDL 37* 04/17/2014 1543   CHOLHDL 6.9 04/17/2014 1543   VLDL 55* 04/17/2014 1543   LDLCALC 162* 04/17/2014 1543   Lab Results  Component Value Date   HGBA1C 7.9 12/14/2011   No results found for: LABOPIA, COCAINSCRNUR, LABBENZ, AMPHETMU, THCU, LABBARB  No results for input(s): ETH in the last 168 hours.   CT HEAD  04/19/2014   Evolving RIGHT superior cerebellar artery territory infarct.  Remote RIGHT basal ganglion and bilateral thalamus lacunar infarcts. Mild white matter changes suggest chronic small vessel ischemic disease.  04/16/2014   1.7 cm focal hypodensity over the right basal ganglia/internal capsule which may be a subacute to chronic infarct. Several other smaller bilateral old basal ganglia lacunar infarcts.  Chronic ischemic microvascular disease.     CTA NECK 04/19/2014   Approximately 60% stenosis RIGHT internal carotid artery by NASCET criteria. No large vessel occlusion.    CTA HEAD 04/19/2014   Mid grade stenosis of RIGHT anterior genu of the carotid siphon, suspected occlusion of RIGHT A1 segment with patent A-comm artery. High-grade stenosis of proximal RIGHT M2 branch.  Mid grade stenosis  of RIGHT vertebral artery, which predominately at terminates in the RIGHT pack of, and included distal thready RIGHT vertebral artery. Suspected high-grade stenosis of the RIGHT superior cerebellar artery origin. Multifocal high-grade stenosis of LEFT P2 segment. High-grade stenosis of RIGHT P1 segment with RIGHT posterior communicating artery present.  Moderate luminal regularity of the cerebral arteries most consistent with atherosclerosis.     [name] Contrast 04/16/2014    Moderate-size acute/ subacute  nonhemorrhagic infarct mid to superior right cerebellar with local mass effect.  Tiny acute nonhemorrhagic posterior right operculum infarct.  Subacute to remote partially hemorrhagic infarct right lenticular nucleus/mid right coronal radiata.  Remote bilateral thalamic infarcts.  Remote infarct anterior left external capsule.  Partially hemorrhagic remote left caudate/anterior limb left duct internal capsule anterior left lenticular nucleus infarct.  Moderate small vessel disease type changes.  Small right vertebral artery may end predominantly in a posterior inferior cerebellar artery distribution. Left vertebral artery, basilar artery and internal carotid arteries appear patent.     Carotid Doppler  There is 1-39% bilateral ICA stenosis. Vertebral artery flow is antegrade.    Bilateral Lower Extremity Venous Dopplers There is no DVT or SVT noted in the bilateral lower extremities.   2D echo  Left ventricle: The cavity size was normal. Systolic function was normal. The estimated ejection fraction was in the range of 55%to 60%. Wall motion was normal; there were no regional wallmotion abnormalities.  TEE Normal LV size and systolic function, EF [id number]. No regional wall motion abnormalities. Normal RV size and systolic function. Trivial MR. Trileaflet aortic valve with no AI or AS. Normal atrial sizes. No LAA thrombus. Negative bubble study, no evidence for PFO or ASD. Normal aorta size with mild plaque descending thoracic aorta. No embolic source.   PHYSICAL EXAM Temp:  [98.1 F (36.7 C)-99.7 F (37.6 C)] 98.1 F (36.7 C) (04/12 1131) Pulse Rate:  [61-72] 62 (04/12 1131) Resp:  [9-20] 10 (04/12 1055) BP: (116-152)/(52-72) 116/72 mmHg (04/12 1131) SpO2:  [95 %-100 %] 98 % (04/12 1131)  General - Well nourished, well developed, in no apparent distress.  Ophthalmologic - Sharp disc margins OU.  Cardiovascular - Regular rate and rhythm with no murmur.  Mental Status -  Level of  arousal and orientation to time, place, and person were intact. Language including expression, naming, repetition, comprehension was assessed and found intact, but significant dysarthria.  Cranial Nerves II - XII - II - Visual field intact OU. III, IV, VI - Extraocular movements intact. V - Facial sensation intact bilaterally. VII - Facial movement intact bilaterally. VIII - Hearing & vestibular intact bilaterally, no nystagmus. X - Palate elevates symmetrically, significant dysarthria. XI - Chin turning & shoulder shrug intact bilaterally. XII - Tongue protrusion intact.  Motor Strength - The patient's strength was RUE 5/5 proximal and 5-/5 distal, bilateral LE 2/5 dorsiflexion, but 5/5 plantarflexion. Pronator drift was absent.  Bulk was normal and fasciculations were absent.   Motor Tone - Muscle tone was assessed at the neck and appendages and was normal.  Reflexes - The patient's reflexes were 1+ in all extremities and he had no pathological reflexes.  Sensory - Light touch, temperature/pinprickwere assessed and were symmetrical.    Coordination - The patient had right UE FTN and right LE HTS ataxia/dysmetria.  Tremor was absent.  Gait and Station - not tested.   ASSESSMENT/PLAN Hayden Mckinney is a 69 y.o. male with history of hypertension, hyperlipidemia, diabetes mellitus, previous strokes and TIAs presenting  with slurred speech and right hemiparesis.  He did not receive IV t-PA due to late presentation.  Strokes:  multifocal ischemic infarcts, felt to be embolic in setting of significant intracranial atherosclerosis, unknown source.   Resultant  Dysarthria, right ataxia, dysphagia  MRI  R superior cerebellar and right opercular acute infarcts. Subacute right lenticular/corona radiata infarct. Old bilateral thalamic infarcts  CTA head and neck scattered atherosclerosis with high-grade stenosis/occlusion right A1, right M2 right vertebral artery, right superior  cerebellar artery, left P2, right P1  Carotid Doppler  No significant stenosis  2D Echo - unremarkable  LE dopplers no DVT  TEE neg for embolic source. Implantable Loop recorder placed by Dr. Rayann Heman to rule out atrial fibrillation as source of stroke  LDL 172, not at goal  A1C pending. Drawn 04/18/14  Lovenox for VTE prophylaxis DIET DYS 3 Room service appropriate?: Yes; Fluid consistency:: Thin   aspirin 81 mg orally every day prior to admission, now on aspirin 300 mg suppository daily. Given Given large vessel intracranial atherosclerosis, patient should be treated with aspirin 81 mg and clopidogrel 75 mg orally every day x 3 months for secondary stroke prevention. After 3 months, change to plavix alone. Long-term dual antiplatelets are contraindicated due to risk for intracerebral hemorrhage.  Ongoing aggressive stroke risk factor management  Therapy recommendations:  Inpatient rehabilitation recommended.   Disposition:  Pending. Rehabilitation admissions coordinator following, hopes for discharge there today.  Hypertension  Home meds: Lisinopril/hydrochlorothiazide  BP should be gradually normalize within 5-7 days  On coreg now.  BP stable  Hyperlipidemia  Home meds:  Pravachol 80 mg daily not resumed in hospital   LDL 172, goal < 70  Changed to Lipitor 40  Continue statin at discharge  Diabetes type II  HgbA1c pending, drawn 4/10, goal < 7.0  Controlled  SSI  Other Stroke Risk Factors  Advanced age  ETOH use  Hx stroke/TIA  Other Active Problems  Renal insufficiency - resolved  Hospital day # [place] for Pager information 04/20/2014 2:27 PM   I, the attending vascular neurologist, have personally obtained a history, examined the patient, evaluated laboratory data, individually viewed imaging studies and agree with radiology interpretations. Together with the NP/PA, we formulated the assessment and plan  of care which reflects our mutual decision.  I have made any additions or clarifications directly to the above note and agree with the findings and plan as currently documented.   69 yo M with multiple stroke risk factors was admitted for right cerebellar stroke with right MCA punctate acute infarct. Although he shas HTN, HLD and DM as stroke factor, the pattern can not exclude from embolic pattern. TEE done negative for cardiac source of stroke, loop recorder placed. He needs aggressive risk factor modification. Recommend dural antiplatelet therapy. Continue statin.   Neurology will sign off. Please call with questions. Pt will follow up with Dr. Erlinda Hong at [facility] in about 2 months. Thanks for the consult.  Rosalin Hawking, MD PhD Stroke Neurology 04/20/2014 2:57 PM       To contact Stroke Continuity provider, please refer to [email or web address] After hours, contact General Neurology

## 2014-04-20 NOTE — H&P (View-Only) (Signed)
STROKE TEAM PROGRESS NOTE   HISTORY Hayden Mckinney is a 69 y.o. male hx of HTN, HLD, DM, prior TIA presenting with slurred speech and right sided weakness x 2 weeks. His sister visited him today and was unable to understand him so she convinced him to come to the hospital for evaluation. He also notes his right sided weakness has worsened in the past few days. He takes an ASA 81mg  daily at home. MRI brain completed in ED, imaging reviewed. Shows acute infarcts in mid to superior right cerebellum, and small acute infarct in right operculum. Multiple old infarcts also noted. His last known well is unclear, > 2weeks ago. tPA was not given as outside treatment window.   SUBJECTIVE (INTERVAL HISTORY) No family members present. He is waiting for TEE but was told to rescheduled to tomorrow. Still has right side ataxia.    OBJECTIVE Temp:  [97.5 F (36.4 C)-99.7 F (37.6 C)] 99.7 F (37.6 C) (04/11 2147) Pulse Rate:  [61-72] 72 (04/11 2147) Cardiac Rhythm:  [-] Normal sinus rhythm (04/11 2038) Resp:  [17-20] 20 (04/11 2147) BP: (122-152)/(53-65) 152/62 mmHg (04/11 2147) SpO2:  [98 %-100 %] 98 % (04/11 2147)   Recent Labs Lab 04/18/14 2158 04/19/14 0710 04/19/14 1124 04/19/14 1641 04/19/14 2150  GLUCAP 182* 144* 147* 162* 188*    Recent Labs Lab 04/16/14 1432 04/17/14 1543 04/18/14 0619 04/19/14 0658  NA 139 139 140 137  K 4.0 3.5 3.5 3.7  CL 104 104 107 109  CO2 26 26 26 25   GLUCOSE 209* 201* 150* 167*  BUN 33* 25* 21 24*  CREATININE 1.81* 1.47* 1.19 1.36*  CALCIUM 9.3 8.5 8.8 8.4   No results for input(s): AST, ALT, ALKPHOS, BILITOT, PROT, ALBUMIN in the last 168 hours.  Recent Labs Lab 04/16/14 1432  WBC 9.0  HGB 13.3  HCT 40.2  MCV 83.8  PLT 287   No results for input(s): CKTOTAL, CKMB, CKMBINDEX, TROPONINI in the last 168 hours. No results for input(s): LABPROT, INR in the last 72 hours. No results for input(s): COLORURINE, LABSPEC, [place], GLUCOSEU,  HGBUR, BILIRUBINUR, KETONESUR, PROTEINUR, UROBILINOGEN, NITRITE, LEUKOCYTESUR in the last 72 hours.  Invalid input(s): APPERANCEUR     Component Value Date/Time   CHOL 254* 04/17/2014 1543   TRIG 276* 04/17/2014 1543   HDL 37* 04/17/2014 1543   CHOLHDL 6.9 04/17/2014 1543   VLDL 55* 04/17/2014 1543   LDLCALC 162* 04/17/2014 1543   Lab Results  Component Value Date   HGBA1C 7.9 12/14/2011   No results found for: LABOPIA, COCAINSCRNUR, LABBENZ, AMPHETMU, THCU, LABBARB  No results for input(s): ETH in the last 168 hours.  I have personally reviewed the radiological images below and agree with the radiology interpretations.  CT HEAD  04/19/2014   Evolving RIGHT superior cerebellar artery territory infarct.  Remote RIGHT basal ganglion and bilateral thalamus lacunar infarcts. Mild white matter changes suggest chronic small vessel ischemic disease.  04/16/2014   1.7 cm focal hypodensity over the right basal ganglia/internal capsule which may be a subacute to chronic infarct. Several other smaller bilateral old basal ganglia lacunar infarcts.  Chronic ischemic microvascular disease.     CTA NECK 04/19/2014   Approximately 60% stenosis RIGHT internal carotid artery by NASCET criteria. No large vessel occlusion.    CTA HEAD 04/19/2014   Mid grade stenosis of RIGHT anterior genu of the carotid siphon, suspected occlusion of RIGHT A1 segment with patent A-comm artery. High-grade stenosis of proximal RIGHT M2 branch.  Mid grade stenosis of RIGHT vertebral artery, which predominately at terminates in the RIGHT pack of, and included distal thready RIGHT vertebral artery. Suspected high-grade stenosis of the RIGHT superior cerebellar artery origin. Multifocal high-grade stenosis of LEFT P2 segment. High-grade stenosis of RIGHT P1 segment with RIGHT posterior communicating artery present.  Moderate luminal regularity of the cerebral arteries most consistent with atherosclerosis.     [name]  Contrast 04/16/2014    Moderate-size acute/ subacute nonhemorrhagic infarct mid to superior right cerebellar with local mass effect.  Tiny acute nonhemorrhagic posterior right operculum infarct.  Subacute to remote partially hemorrhagic infarct right lenticular nucleus/mid right coronal radiata.  Remote bilateral thalamic infarcts.  Remote infarct anterior left external capsule.  Partially hemorrhagic remote left caudate/anterior limb left duct internal capsule anterior left lenticular nucleus infarct.  Moderate small vessel disease type changes.  Small right vertebral artery may end predominantly in a posterior inferior cerebellar artery distribution. Left vertebral artery, basilar artery and internal carotid arteries appear patent.     Carotid Doppler  There is 1-39% bilateral ICA stenosis. Vertebral artery flow is antegrade.    Bilateral Lower Extremity Venous Dopplers There is no DVT or SVT noted in the bilateral lower extremities.   2D echo - Left ventricle: The cavity size was normal. Systolic function was normal. The estimated ejection fraction was in the range of 55% to 60%. Wall motion was normal; there were no regional wall motion abnormalities.  PHYSICAL EXAM  Temp:  [97.5 F (36.4 C)-99.7 F (37.6 C)] 99.7 F (37.6 C) (04/11 2147) Pulse Rate:  [61-72] 72 (04/11 2147) Resp:  [17-20] 20 (04/11 2147) BP: (122-152)/(53-65) 152/62 mmHg (04/11 2147) SpO2:  [98 %-100 %] 98 % (04/11 2147)  General - Well nourished, well developed, in no apparent distress.  Ophthalmologic - Sharp disc margins OU.  Cardiovascular - Regular rate and rhythm with no murmur.  Mental Status -  Level of arousal and orientation to time, place, and person were intact. Language including expression, naming, repetition, comprehension was assessed and found intact, but significant dysarthria.  Cranial Nerves II - XII - II - Visual field intact OU. III, IV, VI - Extraocular movements intact. V - Facial  sensation intact bilaterally. VII - Facial movement intact bilaterally. VIII - Hearing & vestibular intact bilaterally, no nystagmus. X - Palate elevates symmetrically, significant dysarthria. XI - Chin turning & shoulder shrug intact bilaterally. XII - Tongue protrusion intact.  Motor Strength - The patient's strength was RUE 5/5 proximal and 5-/5 distal, bilateral LE 2/5 dorsiflexion, but 5/5 plantarflexion. Pronator drift was absent.  Bulk was normal and fasciculations were absent.   Motor Tone - Muscle tone was assessed at the neck and appendages and was normal.  Reflexes - The patient's reflexes were 1+ in all extremities and he had no pathological reflexes.  Sensory - Light touch, temperature/pinprickwere assessed and were symmetrical.    Coordination - The patient had right UE FTN and right LE HTS ataxia/dysmetria.  Tremor was absent.  Gait and Station - not tested.   ASSESSMENT/PLAN Mr. CHEVALIER KARNITZ is a 69 y.o. male with history of hypertension, hyperlipidemia, diabetes mellitus, previous strokes and TIAs presenting with slurred speech and right hemiparesis.  He did not receive IV t-PA due to late presentation.  Strokes:  multifocal ischemic infarcts, felt to be embolic in setting of significant intracranial atherosclerosis, unknown source.   Resultant  Dysarthria, right ataxia, dysphagia  MRI  R superior cerebellar and right opercular acute  infarcts. Subacute right lenticular/corona radiata infarct. Old bilateral thalamic infarcts  CTA head and neck scattered atherosclerosis with high-grade stenosis/occlusion right A1, right M2 right vertebral artery, right superior cerebellar artery, left P2, right P1  Carotid Doppler  No significant stenosis  2D Echo - unremarkable  LE dopplers no DVT  TEE scheduled for tomorrow at 11a. If no PFO, cardiology will consult and consider loop placement to rule out atrial fibrillation as source of stroke  LDL 172, not at goal  A1C  pending  Lovenox for VTE prophylaxis DIET DYS 3 Room service appropriate?: Yes; Fluid consistency:: Thin   aspirin 81 mg orally every day prior to admission, now on aspirin 300 mg suppository daily. Given Given large vessel intracranial atherosclerosis, patient should be treated with aspirin 81 mg and clopidogrel 75 mg orally every day x 3 months for secondary stroke prevention. After 3 months, change to plavix alone. Long-term dual antiplatelets are contraindicated due to risk for intracerebral hemorrhage.  Ongoing aggressive stroke risk factor management  Therapy recommendations:  Inpatient rehabilitation recommended. Rehabilitation consult pending.  Disposition:  Pending  Hypertension  Home meds: Lisinopril/hydrochlorothiazide  BP should be gradually normalize within 5-7 days  On coreg now.  BP stable  Hyperlipidemia  Home meds:  Pravachol 80 mg daily not resumed in hospital   LDL 162 in , goal < 70  Changed to Lipitor 40  Continue statin at discharge  Diabetes type II  HgbA1c 7.9, goal < 7.0  Controlled  SSI  Other Stroke Risk Factors  Advanced age  ETOH use  Hx stroke/TIA  Other Active Problems  Renal insufficiency - resolved  Hospital day # [place] for Pager information 04/19/2014 2:27 PM   I, the attending vascular neurologist, have personally obtained a history, examined the patient, evaluated laboratory data, individually viewed imaging studies and agree with radiology interpretations. I also discussed with Dr. Sheran Fava regarding his care plan. Together with the NP/PA, we formulated the assessment and plan of care which reflects our mutual decision.  I have made any additions or clarifications directly to the above note and agree with the findings and plan as currently documented.   69 yo M with multiple stroke risk factors was admitted for right cerebellar stroke with right MCA punctate acute infarct. Although  he shas HTN, HLD and DM as stroke factor, the pattern can not exclude from embolic pattern. He needs aggressive risk factor modification as well as TEE and loop monitoring. Recommend dural antiplatelet therapy. Continue statin. Will follow.  Rosalin Hawking, MD PhD Stroke Neurology 04/19/2014 10:36 PM       To contact Stroke Continuity provider, please refer to [email or web address] After hours, contact General Neurology

## 2014-04-20 NOTE — PMR Pre-admission (Signed)
PMR Admission Coordinator Pre-Admission Assessment  Patient: Hayden Mckinney is an 69 y.o., male MRN: [id number] DOB: 12/08/1945 Height: 5\' 10"  (177.8 cm) Weight: 67.1 kg (147 lb 14.9 oz)              Insurance Information HMO: No    PPO:       PCP:       IPA:       80/20:       OTHER:   PRIMARY: Medicare A/B      Policy#: [id number]      Subscriber: Dudley Major CM Name:        Phone#:       Fax#:   Pre-Cert#:        Employer: Retired Benefits:  Phone #:       Name: Checked in [employer]. 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 right handed male with history of hypertension, diabetes mellitus peripheral neuropathy, TIA maintained on aspirin 81 mg daily. Presented 04/16/2014 with right-sided weakness and slurred speech 2 weeks. MRI of the brain shows moderate size acute subacute nonhemorrhagic infarct mid to superior right cerebellar with local mass effect as well as tiny acute nonhemorrhagic posterior right operculum infarct, subacute to remote partially hemorrhagic infarct right lenticular nucleus mid right coronal radiata and remote bilateral thalamic and anterior left external capsule infarct. Echocardiogram with ejection fraction of 60% and no regional wall motion abnormalities. Carotid Dopplers with no ICA stenosis. Venous Dopplers lower extremities negative. CTA of the neck showed approximately 60% stenosis right internal carotid artery. CTA of the head with grade stenosis right vertebral artery. Patient did  not receive TPA. TEE showed ejection fraction 65% without thrombus no PFO and underwent implantation of loop recorder 04/20/2014.  Neurology consulted presently on aspirin/Plavix daily for CVA prophylaxis 3 months then Plavix alone and also placed on subcutaneous Lovenox for DVT prophylaxis. 04/20/2014. On 4/12 developed cough and low-grade fever chest x-ray showed left basilar atelectasis no focal confluent infiltrate and placed on intravenous empiric Unasyn and changed to Augmentin 6 days 04/21/2014. Additionally his creatinine was trending upwards of 1.55. Renal ultrasound negative. Physical therapy evaluation completed with recommendations of physical medicine rehabilitation consult. Patient to be admitted for comprehensiveinpatient rehabilitation program.  Total: 3=NIH  Past Medical History  Past Medical History  Diagnosis Date  . Type II diabetes mellitus   . Sarcoma     a. R leg  . Diabetic peripheral neuropathy     a. both feet  . TIA (transient ischemic attack)     a. 08/2010.  Marland Kitchen Hyperlipidemia   . Hypertension   . Stroke     a. 04/2014 multiple bateral infarcts, presumed to be embolic;  b. [id number] Carotid U/S: 1-39% bilat ICA stenosis.  . H/O echocardiogram     a. 04/2014 Echo: EF 55-60%, no rwma.  Marland Kitchen ETOH abuse  a. 04/2014 18-24 beers q weekend.    Family History  family history includes Cancer in his father and mother; Diabetes in his brother.  Prior Rehab/Hospitalizations:  No previous rehab admissions.   Current Medications   Current facility-administered medications:  .  0.9 %  sodium chloride infusion, , Intravenous, Continuous, Rogelia Mire, NP, Last Rate: 20 mL/hr at 04/21/14 1001 .  acetaminophen (TYLENOL) tablet 650 mg, 650 mg, Oral, Q6H PRN, Dianne Dun, NP, 650 mg at 04/17/14 2026 .  Ampicillin-Sulbactam (UNASYN) 3 g in sodium chloride 0.9 % 100 mL IVPB, 3 g, Intravenous, Q8H, Eudelia Bunch, RPH, 3 g at 04/21/14 0736 .  aspirin EC tablet 81  mg, 81 mg, Oral, Daily, Donzetta Starch, NP, 81 mg at 04/21/14 0949 .  atorvastatin (LIPITOR) tablet 40 mg, 40 mg, Oral, q1800, Janece Canterbury, MD, 40 mg at 04/20/14 1714 .  carvedilol (COREG) tablet 6.25 mg, 6.25 mg, Oral, BID WC, Janece Canterbury, MD, 6.25 mg at 04/20/14 1714 .  clopidogrel (PLAVIX) tablet 75 mg, 75 mg, Oral, Daily, Donzetta Starch, NP, 75 mg at 04/21/14 0949 .  enoxaparin (LOVENOX) injection 40 mg, 40 mg, Subcutaneous, Q24H, Merton Border, MD, 40 mg at 04/20/14 2213 .  hydrALAZINE (APRESOLINE) injection 10 mg, 10 mg, Intravenous, Q4H PRN, Janece Canterbury, MD, 10 mg at 04/17/14 1847 .  insulin aspart (novoLOG) injection 0-5 Units, 0-5 Units, Subcutaneous, QHS, Merton Border, MD, 2 Units at 04/17/14 2215 .  insulin aspart (novoLOG) injection 0-9 Units, 0-9 Units, Subcutaneous, TID WC, Merton Border, MD, 5 Units at 04/20/14 1714 .  isosorbide-hydrALAZINE (BIDIL) 20-37.5 MG per tablet 1 tablet, 1 tablet, Oral, TID, Janece Canterbury, MD, 1 tablet at 04/21/14 0949 .  senna-docusate (Senokot-S) tablet 1 tablet, 1 tablet, Oral, QHS PRN, Merton Border, MD .  sodium chloride 0.9 % bolus 1,000 mL, 1,000 mL, Intravenous, Once, Thurnell Lose, MD, 1,000 mL at 04/21/14 1015  Patients Current Diet: DIET DYS 3 Room service appropriate?: Yes; Fluid consistency:: Thin  Precautions / Restrictions Precautions Precautions: Fall Restrictions Weight Bearing Restrictions: No   Prior Activity Level Community (5-7x/wk): Went to bars with his roommate.  Do longer driving.  Had DUIs in the past.   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[place] Devices/Equipment: Cane (specify quad or straight), CBG Meter, Eyeglasses, Hand-held shower hose  Prior Functional Level Prior Function Level of Independence: Independent Comments: ambulated holding furniture, independent with self care, assist to get groceries, could prepare a simple meal, shared housekeeping with roommate  Current Functional Level Cognition   Arousal/Alertness: Awake/alert Overall Cognitive Status: Impaired/Different from baseline Orientation Level: Oriented X4 Safety/Judgement: Decreased awareness of deficits, Decreased awareness of safety General Comments: Patient slightly more impulsive this session, nsg aware Attention: Divided Divided Attention: Appears intact Memory: Appears intact Awareness: Impaired Awareness Impairment: Emergent impairment Problem Solving: Appears intact    Extremity Assessment (includes Sensation/Coordination)  Upper Extremity Assessment: RUE deficits/detail, LUE deficits/detail RUE Deficits / Details: 4/5 except gross grip 3/5, long standing joint deformity first finger DIP LUE Deficits / Details: 4/5 strength, slower movement compared to R  Lower Extremity Assessment: Defer to PT evaluation RLE Deficits / Details: weakness noted, distal motions weaker then proximal. 3+/5, (1/5 dorsiflexion) RLE Sensation: decreased light touch, decreased proprioception RLE Coordination: decreased fine motor, decreased gross motor    ADLs  Overall ADL's : Needs assistance/impaired Eating/Feeding: Set up, Sitting Grooming: Wash/dry hands, Wash/dry face, Brushing hair, Set up, Sitting Upper Body Bathing: Minimal assitance, Sitting Lower  Body Bathing: Moderate assistance, Sit to/from stand Upper Body Dressing : Minimal assistance, Sitting Lower Body Dressing: Moderate assistance, Sit to/from stand Toilet Transfer: Minimal assistance, Buyer, retail Details (indicate cue type and reason): simulated bed to chair Toileting- Clothing Manipulation and Hygiene: Maximal assistance, Sit to/from stand General ADL Comments: Pt able to reach down to socks to pull them up, but not start them over his toes.    Mobility  Overal bed mobility: Needs Assistance Bed Mobility: Supine to Sit Supine to sit: Supervision, HOB elevated (use of rail) General bed mobility comments: HOB elevated, reliance on rail,  increased time and assist for trunk positioning    Transfers  Overall transfer level: Needs assistance Equipment used: Rolling walker (2 wheeled) Transfers: Sit to/from Stand Sit to Stand: Min assist Stand pivot transfers: Min assist General transfer comment: min assist to rise and for balance, ataxic quality to movement    Ambulation / Gait / Stairs / Wheelchair Mobility  Ambulation/Gait Ambulation/Gait assistance: Mod assist, +2 physical assistance Ambulation Distance (Feet): 80 Feet (x2 with seated rest break) Assistive device: Rolling walker (2 wheeled) Gait Pattern/deviations: Step-to pattern, Decreased stride length, Ataxic, Decreased dorsiflexion - right, Narrow base of support, Scissoring Gait velocity: decreased Gait velocity interpretation: Below normal speed for age/gender General Gait Details: patient remains significantly ataxic with mobility, unable to ambulate safely without bilateral +2 assist, patient with poor coordination of drop foot during ambulation. Specific gait training cues for positioning of stride, cued to increase width between LEs (scissoring gait at times). Verbal and tactile cues for posture during gait retraining.     Posture / Balance Balance Overall balance assessment: Needs assistance Sitting balance-Leahy Scale: Fair Standing balance-Leahy Scale: Poor Standing balance comment: unable to stand without holding on to therapist or rail    Special needs/care consideration BiPAP/CPAP No CPM No Continuous Drip IV KVO Dialysis No    Life Vest No Oxygen No Special Bed No Trach Size No Wound Vac (area) No   Skin No                           Bowel mgmt: Last BM 04/18/14 Bladder mgmt: Incontinent of urine Diabetic mgmt Yes    Previous Home Environment Living Arrangements: Non-relatives/Friends  Lives With: Other (Comment) (roommate) Available Help at Discharge: Family, Available 24 hours/day Type of Home: Mobile home Home Layout: One level Home  Access: Stairs to enter [employer] of Steps: 3-4 steps Prentiss: No  Discharge Living Setting Plans for Discharge Living Setting: House, Lives with (comment) (Plans to go home with his half sister, Vivien Rota.) Type of Home at Discharge: House Discharge Home Layout: One level Discharge Home Access: Stairs to enter Entrance Stairs-Number of Steps: 2-3 steps Does the patient have any problems obtaining your medications?: Yes (Describe)  Social/Family/Support Systems Patient Roles: Other (Comment) (Has a roommate and half sisters.) Contact Information: Serita Sheller - half sister Anticipated Caregiver: Vivien Rota Anticipated Caregiver's Contact Information: Vivien Rota [phone] Ability/Limitations of Caregiver: Half sister can provide supervision and patient can stay with her while recovering. Caregiver Availability: 24/7 Discharge Plan Discussed with Primary Caregiver: Yes Is Caregiver In Agreement with Plan?: Yes Does Caregiver/Family have Issues with Lodging/Transportation while Pt is in Rehab?: No  Goals/Additional Needs Patient/Family Goal for Rehab: PT mod I, OT mod I, ST mod I and supervision goals Expected length of stay: 10-14 days Cultural Considerations: Christian Dietary Needs: Dys 3, thin liquids Equipment Needs: TBD Pt/Family Agrees  to Admission and willing to participate: Yes Program Orientation Provided & Reviewed with Pt/Caregiver Including Roles  & Responsibilities: Yes  Decrease burden of Care through IP rehab admission: N/A  Possible need for SNF placement upon discharge: Not anticipated  Patient Condition: This patient's medical and functional status has changed since the consult dated: 04/19/14 in which the Rehabilitation Physician determined and documented that the patient's condition is appropriate for intensive rehabilitative care in an inpatient rehabilitation facility. See "History of Present Illness" (above) for medical update. Functional changes  are: Currently requiring mod assist +2 to ambulate 80 ft RW with seated rest break X 2. Patient's medical and functional status update has been discussed with the Rehabilitation physician and patient remains appropriate for inpatient rehabilitation. Will admit to inpatient rehab today.  Preadmission Screen Completed By:  Retta Diones, 04/21/2014 10:57 AM ______________________________________________________________________   Discussed status with Dr. Naaman Plummer on 04/21/14 at 1057 and received telephone approval for admission today.  Admission Coordinator:  Retta Diones, time1057/Date04/13/16

## 2014-04-20 NOTE — Progress Notes (Addendum)
[facility] PROGRESS NOTE  Hayden Mckinney [id number] DOB: 25-Jul-1945 DOA: 04/16/2014 PCP: No primary care provider on file.  Brief Summary  Hayden Mckinney is a 69 y.o. male, with history hypertension, diabetes, and TIAs who presented with 2 weeks history of increasing weakness, falls and slurred speech.  His sister noticed his slurred speech and advised him to come to the hospital.  MRI demonstrated a moderate sized acute to subacute infarct in the mid to superior right cerebellum with local mass effect. He also had tiny acute posterior right operculum infarct, subacute partially hemorrhagic infarct of the right lenticular nucleus and right mid coronal radiata.  He also had evidence of remote bilateral infarcts, which were partially hemorrhagic.  He failed his swallow evaluation and has been admitted for further workup of his strokes and therapy.  On 4/10, he had left upper extremity and right lower extremity numbness suggestive of new infarcts and he underwent CTangio head and neck.  He has intracranial atherosclerosis and was changed to aspirin plus plavix to continue for three months followed by monotherapy plavix.  Neurology, however, still feels he is having embolic strokes given distribution.  Telemetry demonstrated NSR.  TEE demonstrated no PFO or intracardiac thrombus.  He had a loop recorder implanted on 4/12.  On 4/12 he developed increased cough, low-grade fevers and chest x-ray demonstrated left lower lobe pneumonia. Most likely this is aspiration pneumonia. Additionally, his creatinine started trending up again. Because he had not sought medical attention for the last 3 years it is unclear if this is due to acute kidney injury over chronic kidney disease.  I am starting him on IV fluids and antibiotics and will repeat labs again in the morning. He can go to CIR if he is medically stable.  Assessment/Plan  Acute and subacute embolic infarcts with persistent dysarthria and  dysphagia.  He developed some numbness of the left upper and right lower extremities on 4/10.  -  CT angio head and neck:  Evolving right superior cerebellar artery territory infarct, remote right basal ganglion and bilateral thalamus lacunar infarcts. -  CT neck with approximate 60% stenosis of the right internal carotid artery, no large vessel occlusion.  -  CT angio neck:  Multivessel stenoses and occlusions  -  Echo:  Preserved EF, no wall motion abnl or valve dysfunction or obvious PFO -  Carotid duplex:  1-39% ICA stenosis bilaterally with antegrade vertebral artery flow -  PT/OT/speech therapy:  CIR -  Asa + plavix for three months, then monotherapy plavix starting 7/12 (unless we find a-fib and then he needs full anticoagulation) -  Telemetry: Normal sinus rhythm, no arrhythmias -  Hemoglobin A1c pending and lipid panel as below -  TEE:  No PFO or intracardiac thrombus -  Loop recorder placed on 4/12  Aspiration pneumonia -  Blood cultures -  Legionella and S. 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  Perhaps he has CKD due to his diabetes and high blood pressure, but he has not had bloodwork done in 3 years. -  Increase IVF to 125mg /h overnight -  Repeat BMP in AM -  FENa and RUS with duplex (at risk for  RAS) -  Minimize nephrotoxins and renally dose medications  Low grade fever, at risk for aspiration pneumonia  -  Check UA, CXR, CBC with diff  Diet:  Dysphagia 3 with thin Access:  PIV IVF:  Yes Proph:  Lovenox  Code Status: Full Family Communication: Patient alone Disposition Plan:  To CIR on 4/13   Consultants:  Neurology  Cardiology  PM&R  Procedures:  MRI brain  CTA head with midgrade stenosis of the right anterior genu of the carotid siphon, probable occlusion of the right A1 segment with patent anterior community getting artery, high-grade stenosis of the proximal right M2 branch, midgrade stenosis of the right vertebral artery with the ready right vertebral artery flow, suspected high-grade stenosis of the right superior cerebellar artery, multifocal high-grade stenosis of the left P2 segment, high-grade stenosis of the right P1 segment with right posterior communicating artery present.  TEE and loop recorder 4/12  ECHO  Antibiotics:  Unasyn 4/12 >>  HPI/Subjective:  Speech a little better today, numbness in left arm and right leg stable from yesterday. Increased cough and chest congestion and felt warm last night.  Had low grade fever 99.52F.      Objective: Filed Vitals:   04/20/14 1045 04/20/14 1050 04/20/14 1055 04/20/14 1131  BP: 119/55 131/52 121/60 116/72  Pulse: 63 63 61 62  Temp:    98.1 F (36.7 C)  TempSrc:    Oral  Resp: 11 16 10    Height:      Weight:      SpO2: 96% 96% 95% 98%    Intake/Output Summary (Last 24 hours) at 04/20/14 1137 Last data filed at 04/20/14 0800  Gross per 24 hour  Intake    360 ml  Output    625 ml  Net   -265 ml   Filed Weights   04/16/14 2054  Weight: 67.1 kg (147 lb 14.9 oz)    Exam:   General:  Adult male, No acute distress, dysarthric but easier to understand today  HEENT:  NCAT, MMM  Cardiovascular:  RRR, nl S1, S2 no mrg, 2+ pulses, warm extremities  Respiratory:  Diminished/bronchial breath  sounds on the left base with a few rales, no rhonchi or wheeze, no increased WOB  Abdomen:   NABS, soft, NT/ND  MSK:   Decreased tone and bulk of RLE with some abrasions on knee and shin, no LEE  Neuro:  Dysarthria, no facial droop.  Tongue straight, palate slightly low on right compared to left.  Bilateral upper extremities 5-/5, mild dysmetria on the left, foot drop on the right, otherwise strength 5-/5.  Sensation decreased on right lower extremity and left hand.    Data Reviewed: Basic Metabolic Panel:  Recent Labs Lab 04/16/14 1432 04/17/14 1543 04/18/14 0619 04/19/14 0658 04/20/14 0714  NA 139 139 140 137 141  K 4.0 3.5 3.5 3.7 3.6  CL 104 104 107 109 110  CO2 26 26 26 25 27   GLUCOSE 209* 201* 150* 167* 179*  BUN 33* 25* 21 24* 26*  CREATININE 1.81* 1.47* 1.19  1.36* 1.55*  CALCIUM 9.3 8.5 8.8 8.4 8.8   Liver Function Tests: No results for input(s): AST, ALT, ALKPHOS, BILITOT, PROT, ALBUMIN in the last 168 hours. No results for input(s): LIPASE, AMYLASE in the last 168 hours. No results for input(s): AMMONIA in the last 168 hours. CBC:  Recent Labs Lab 04/16/14 1432  WBC 9.0  HGB 13.3  HCT 40.2  MCV 83.8  PLT 287   Cardiac Enzymes: No results for input(s): CKTOTAL, CKMB, CKMBINDEX, TROPONINI in the last 168 hours. BNP (last 3 results) No results for input(s): BNP in the last 8760 hours.  ProBNP (last 3 results) No results for input(s): PROBNP in the last 8760 hours.  CBG:  Recent Labs Lab 04/19/14 0710 04/19/14 1124 04/19/14 1641 04/19/14 2150 04/20/14 0653  GLUCAP 144* 147* 162* 188* 167*    No results found for this or any previous visit (from the past 240 hour(s)).   Studies: Ct Angio Head W/cm &/or Wo Cm  04/19/2014   CLINICAL DATA:  Slurred speech and RIGHT-sided weakness for 2 weeks, worsening speech difficulties today. History of hypertension, diabetes, hyperlipidemia, sarcoma.  EXAM: CT ANGIOGRAPHY HEAD AND NECK  TECHNIQUE: Multidetector  CT imaging of the head and neck was performed using the standard protocol during bolus administration of intravenous contrast. Multiplanar CT image reconstructions and MIPs were obtained to evaluate the vascular anatomy. Carotid stenosis measurements (when applicable) are obtained utilizing NASCET criteria, using the distal internal carotid diameter as the denominator.  CONTRAST:  74mL OMNIPAQUE IOHEXOL 350 MG/ML SOLN  COMPARISON:  MRI of the head April 16 2014  FINDINGS: CT HEAD  The ventricles and sulci are normal for age. No intraparenchymal hemorrhage, mass effect nor midline shift. Patchy supratentorial white matter hypodensities are within normal range for patient's age and though non-specific suggest sequelae of chronic small vessel ischemic disease. Wedge-like hypodensity RIGHT mid to superior cerebellum. RIGHT basal ganglia lacunar infarct was present previously, similar appearance of bilateral thalamus small lacunar infarcts. No abnormal intracranial enhancement.  No abnormal extra-axial fluid collections. Basal cisterns are patent. Minimal calcific atherosclerosis of the carotid siphons.  No skull fracture. Persistent metopic suture. The included ocular globes and orbital contents are non-suspicious. The mastoid aircells and included paranasal sinuses are well-aerated. Patient is edentulous.  CTA NECK  Normal appearance of the thoracic arch, normal branch pattern. The origins of the innominate, left Common carotid artery and subclavian artery are widely patent, intimal thickening partially obscured by streak artifact from retained LEFT subclavian venous contrast.  Bilateral Common carotid arteries are widely patent, coursing in a straight line fashion. Calcific atherosclerosis and eccentric intimal hematoma results in approximately 60% narrowing of RIGHT internal carotid artery by NASCET criteria, approximate 13 mm segment of stenosis beginning at the origin. Normal appearance of the mid to distal included  internal carotid arteries.  Reflux of contrast limits evaluation the vertebral artery origins. Left vertebral artery is dominant. Suspected mid to high-grade stenosis the origin of LEFT vertebral artery with mild poststenotic dilatation.  No dissection, no pseudoaneurysm. No abnormal luminal irregularity. No contrast extravasation.  Soft tissues are unremarkable. No acute osseous process though bone windows have not been submitted.  CTA HEAD  Anterior circulation: Patent cervical internal carotid arteries, petrous, cavernous and supra clinoid internal carotid arteries. Intimal thickening results in at least mid grade stenosis anterior genu of the RIGHT carotid siphon. Widely patent anterior communicating artery. Congenitally dominant LEFT A1 segment. Suspected occlusion of RIGHT A1 segment. High-grade stenosis of proximal RIGHT  M2 branch. Moderate luminal irregularity of the anterior and middle cerebral arteries.  Posterior circulation: LEFT vertebral artery is dominant. Mid grade stenosis of RIGHT vertebral artery which predominately in terminates in the RIGHT posterior inferior cerebellar artery, with occluded distal thready RIGHT vertebral artery. Mild stenosis LEFT vertebral artery. Widely patent basilar artery. High-grade stenosis suspected of RIGHT superior cerebellar artery origin with moderate luminal irregularity of the RIGHT greater than LEFT superior cerebellar arterys. RIGHT vertebral artery terminates in the posterior inferior cerebellar artery. Patent posterior cerebral arteries with multifocal mid high-grade stenosis of the LEFT P2 segment. High-grade stenosis of RIGHT P1 segment with thready RIGHT posterior communicating artery present, moderate luminal regularity of the mid to distal RIGHT posterior cerebral artery.  No large vessel occlusion, dissection, luminal irregularity, contrast extravasation or aneurysm within the anterior nor posterior circulation.  IMPRESSION: CT HEAD: Evolving RIGHT  superior cerebellar artery territory infarct.  Remote RIGHT basal ganglion and bilateral thalamus lacunar infarcts. Mild white matter changes suggest chronic small vessel ischemic disease.  CTA NECK: Approximately 60% stenosis RIGHT internal carotid artery by NASCET criteria. No large vessel occlusion.  CTA HEAD: Mid grade stenosis of RIGHT anterior genu of the carotid siphon, suspected occlusion of RIGHT A1 segment with patent A-comm artery. High-grade stenosis of proximal RIGHT M2 branch.  Mid grade stenosis of RIGHT vertebral artery, which predominately at terminates in the RIGHT pack of, and included distal thready RIGHT vertebral artery. Suspected high-grade stenosis of the RIGHT superior cerebellar artery origin. Multifocal high-grade stenosis of LEFT P2 segment. High-grade stenosis of RIGHT P1 segment with RIGHT posterior communicating artery present.  Moderate luminal regularity of the cerebral arteries most consistent with atherosclerosis.   Electronically Signed   By: Elon Alas   On: 04/19/2014 04:03   Ct Angio Neck W/cm &/or Wo/cm  04/19/2014   CLINICAL DATA:  Slurred speech and RIGHT-sided weakness for 2 weeks, worsening speech difficulties today. History of hypertension, diabetes, hyperlipidemia, sarcoma.  EXAM: CT ANGIOGRAPHY HEAD AND NECK  TECHNIQUE: Multidetector CT imaging of the head and neck was performed using the standard protocol during bolus administration of intravenous contrast. Multiplanar CT image reconstructions and MIPs were obtained to evaluate the vascular anatomy. Carotid stenosis measurements (when applicable) are obtained utilizing NASCET criteria, using the distal internal carotid diameter as the denominator.  CONTRAST:  73mL OMNIPAQUE IOHEXOL 350 MG/ML SOLN  COMPARISON:  MRI of the head April 16 2014  FINDINGS: CT HEAD  The ventricles and sulci are normal for age. No intraparenchymal hemorrhage, mass effect nor midline shift. Patchy supratentorial white matter  hypodensities are within normal range for patient's age and though non-specific suggest sequelae of chronic small vessel ischemic disease. Wedge-like hypodensity RIGHT mid to superior cerebellum. RIGHT basal ganglia lacunar infarct was present previously, similar appearance of bilateral thalamus small lacunar infarcts. No abnormal intracranial enhancement.  No abnormal extra-axial fluid collections. Basal cisterns are patent. Minimal calcific atherosclerosis of the carotid siphons.  No skull fracture. Persistent metopic suture. The included ocular globes and orbital contents are non-suspicious. The mastoid aircells and included paranasal sinuses are well-aerated. Patient is edentulous.  CTA NECK  Normal appearance of the thoracic arch, normal branch pattern. The origins of the innominate, left Common carotid artery and subclavian artery are widely patent, intimal thickening partially obscured by streak artifact from retained LEFT subclavian venous contrast.  Bilateral Common carotid arteries are widely patent, coursing in a straight line fashion. Calcific atherosclerosis and eccentric intimal hematoma results in approximately 60% narrowing of  RIGHT internal carotid artery by NASCET criteria, approximate 13 mm segment of stenosis beginning at the origin. Normal appearance of the mid to distal included internal carotid arteries.  Reflux of contrast limits evaluation the vertebral artery origins. Left vertebral artery is dominant. Suspected mid to high-grade stenosis the origin of LEFT vertebral artery with mild poststenotic dilatation.  No dissection, no pseudoaneurysm. No abnormal luminal irregularity. No contrast extravasation.  Soft tissues are unremarkable. No acute osseous process though bone windows have not been submitted.  CTA HEAD  Anterior circulation: Patent cervical internal carotid arteries, petrous, cavernous and supra clinoid internal carotid arteries. Intimal thickening results in at least mid grade  stenosis anterior genu of the RIGHT carotid siphon. Widely patent anterior communicating artery. Congenitally dominant LEFT A1 segment. Suspected occlusion of RIGHT A1 segment. High-grade stenosis of proximal RIGHT M2 branch. Moderate luminal irregularity of the anterior and middle cerebral arteries.  Posterior circulation: LEFT vertebral artery is dominant. Mid grade stenosis of RIGHT vertebral artery which predominately in terminates in the RIGHT posterior inferior cerebellar artery, with occluded distal thready RIGHT vertebral artery. Mild stenosis LEFT vertebral artery. Widely patent basilar artery. High-grade stenosis suspected of RIGHT superior cerebellar artery origin with moderate luminal irregularity of the RIGHT greater than LEFT superior cerebellar arterys. RIGHT vertebral artery terminates in the posterior inferior cerebellar artery. Patent posterior cerebral arteries with multifocal mid high-grade stenosis of the LEFT P2 segment. High-grade stenosis of RIGHT P1 segment with thready RIGHT posterior communicating artery present, moderate luminal regularity of the mid to distal RIGHT posterior cerebral artery.  No large vessel occlusion, dissection, luminal irregularity, contrast extravasation or aneurysm within the anterior nor posterior circulation.  IMPRESSION: CT HEAD: Evolving RIGHT superior cerebellar artery territory infarct.  Remote RIGHT basal ganglion and bilateral thalamus lacunar infarcts. Mild white matter changes suggest chronic small vessel ischemic disease.  CTA NECK: Approximately 60% stenosis RIGHT internal carotid artery by NASCET criteria. No large vessel occlusion.  CTA HEAD: Mid grade stenosis of RIGHT anterior genu of the carotid siphon, suspected occlusion of RIGHT A1 segment with patent A-comm artery. High-grade stenosis of proximal RIGHT M2 branch.  Mid grade stenosis of RIGHT vertebral artery, which predominately at terminates in the RIGHT pack of, and included distal thready  RIGHT vertebral artery. Suspected high-grade stenosis of the RIGHT superior cerebellar artery origin. Multifocal high-grade stenosis of LEFT P2 segment. High-grade stenosis of RIGHT P1 segment with RIGHT posterior communicating artery present.  Moderate luminal regularity of the cerebral arteries most consistent with atherosclerosis.   Electronically Signed   By: Elon Alas   On: 04/19/2014 04:03    Scheduled Meds: . aspirin EC  81 mg Oral Daily  . atorvastatin  40 mg Oral q1800  . carvedilol  6.25 mg Oral BID WC  . clopidogrel  75 mg Oral Daily  . enoxaparin (LOVENOX) injection  40 mg Subcutaneous Q24H  . insulin aspart  0-5 Units Subcutaneous QHS  . insulin aspart  0-9 Units Subcutaneous TID WC  . isosorbide-hydrALAZINE  1 tablet Oral TID   Continuous Infusions: . sodium chloride 50 mL/hr at 04/16/14 2146  . sodium chloride      Active Problems:   Type II diabetes mellitus with neurological manifestations   HTN (hypertension)   Hyperlipidemia   Stroke   CVA (cerebral infarction)   Speech abnormality    Time spent: 30 min    Margarie Mcguirt, [facility] Pager [phone]. If 7PM-7AM, please contact night-coverage at [email or web address], password [facility] 04/20/2014, 11:37  AM  LOS: 4 days

## 2014-04-20 NOTE — Progress Notes (Signed)
  Echocardiogram Echocardiogram Transesophageal has been performed.  Lysle Rubens 04/20/2014, 12:05 PM

## 2014-04-20 NOTE — Progress Notes (Signed)
Rehab admissions - Evaluated for possible admission.  Patient asleep on my rounds.  I called his half sister, Vivien Rota, and spoke with her.  Patient can go home with Vivien Rota after rehab stay.  Currently patient is down for a TEE.  I anticipate being able to admit to acute inpatient rehab after TEE later today.  Call me for questions.  [phone]

## 2014-04-20 NOTE — CV Procedure (Signed)
Procedure: TEE  Indication: CVA  Sedation: Versed 2 mg IV, Fentanyl 25 mcg IV  Findings: Please see echo section for full report.  Normal LV size and systolic function, EF [id number].  No regional wall motion abnormalities.  Normal RV size and systolic function.  Trivial MR.  Trileaflet aortic valve with no AI or AS.  Normal atrial sizes.  No LAA thrombus.  Negative bubble study, no evidence for PFO or ASD.  Normal aorta size with mild plaque descending thoracic aorta.   No source of embolus noted.   Loralie Champagne 04/20/2014 10:32 AM

## 2014-04-20 NOTE — Progress Notes (Signed)
OT Cancellation Note  Patient Details Name: Hayden Mckinney MRN: [id number] DOB: 09/10/1945   Cancelled Treatment:    Reason Eval/Treat Not Completed: Patient at procedure or test/ unavailable (TEE). Pt to d/c to CIR today so OT will defer further therapy to next venue at this time. Spoke with [facility] CIR coordinator to confirm.   Parke Poisson B 04/20/2014, 9:42 AM

## 2014-04-20 NOTE — H&P (Signed)
Physical Medicine and Rehabilitation Admission H&P    Chief Complaint  Patient presents with  . Aphasia  : HPI: Hayden Mckinney is a 69 y.o. right handed male with history of hypertension, diabetes mellitus peripheral neuropathy, TIA maintained on aspirin 81 mg daily. Presented 04/16/2014 with right-sided weakness and slurred speech 2 weeks. MRI of the brain shows moderate size acute subacute nonhemorrhagic infarct mid to superior right cerebellar with local mass effect as well as tiny acute nonhemorrhagic posterior right operculum infarct, subacute to remote partially hemorrhagic infarct right lenticular nucleus mid right coronal radiata and remote bilateral thalamic and anterior left external capsule infarct. Echocardiogram with ejection fraction of 60% and no regional wall motion abnormalities. Carotid Dopplers with no ICA stenosis. Venous Dopplers lower extremities negative. CTA of the neck showed approximately 60% stenosis right internal carotid artery. CTA of the head with grade stenosis right vertebral artery. Patient did not receive TPA. TEE showed ejection fraction 65% without thrombus no PFO and underwent implantation of loop recorder 04/20/2014 Dr. Rayann Heman. Neurology consulted presently on aspirin/Plavix daily for CVA prophylaxis 3 months then Plavix alone and also placed on subcutaneous Lovenox for DVT prophylaxis. 04/20/2014. On 4/12 developed cough and low-grade fever chest x-ray showed left basilar atelectasis no focal confluent infiltrate and placed on intravenous empiric Unasyn and changed to Augmentin 6 days 04/21/2014. Additionally his creatinine was trending upwards of 1.55. Renal ultrasound negative. Modified barium swallow 04/21/2014 placed on a mechanical soft and nectar liquids. Physical therapy evaluation completed with recommendations of physical medicine rehabilitation consult. Patient was admitted for comprehensive rehabilitation program  ROS Review of Systems  Eyes:  Positive for pain.  Gastrointestinal: Positive for constipation.  Musculoskeletal: Positive for myalgias.  Neurological: Positive for speech change and weakness.  All other systems reviewed and are negative   Past Medical History  Diagnosis Date  . Type II diabetes mellitus   . Sarcoma     a. R leg  . Diabetic peripheral neuropathy     a. both feet  . TIA (transient ischemic attack)     a. 08/2010.  Marland Kitchen Hyperlipidemia   . Hypertension   . Stroke     a. 04/2014 multiple bateral infarcts, presumed to be embolic;  b. 01/6107 Carotid U/S: 1-39% bilat ICA stenosis.  . H/O echocardiogram     a. 04/2014 Echo: EF 55-60%, no rwma.  Marland Kitchen ETOH abuse     a. 04/2014 18-24 beers q weekend.   Past Surgical History  Procedure Laterality Date  . Sarcoma removal    . Repair of r hand after trauma     Family History  Problem Relation Age of Onset  . Cancer Mother   . Cancer Father   . Diabetes Brother    Social History:  reports that he has never smoked. He has never used smokeless tobacco. He reports that he drinks about 3.6 oz of alcohol per week. He reports that he does not use illicit drugs. Allergies: No Known Allergies Medications Prior to Admission  Medication Sig Dispense Refill  . Blood Glucose Monitoring Suppl (RELION CONFIRM GLUCOSE MONITOR) W/DEVICE KIT Use as instructed. 1 kit 0  . lisinopril-hydrochlorothiazide (PRINZIDE) 10-12.5 MG per tablet Take 1 tablet by mouth daily. 60 tablet 6  . metFORMIN (GLUCOPHAGE) 1000 MG tablet Take 1 tablet (1,000 mg total) by mouth 2 (two) times daily with a meal. 120 tablet 6  . pravastatin (PRAVACHOL) 80 MG tablet Take 1 tablet (80 mg total) by mouth every evening.  30 tablet 11  . pregabalin (LYRICA) 50 MG capsule Take 1 capsule (50 mg total) by mouth 2 (two) times daily. (Patient not taking: Reported on 04/17/2014) 60 capsule 6    Home: Home Living Family/patient expects to be discharged to:: Inpatient rehab Living Arrangements:  Non-relatives/Friends Available Help at Discharge: Family, Available 24 hours/day Type of Home: House  Lives With: Other (Comment) (roommate)   Functional History: Prior Function Level of Independence: Independent Comments: ambulated holding furniture, independent with self care, assist to get groceries, could prepare a simple meal, shared housekeeping with roommate  Functional Status:  Mobility: Bed Mobility Overal bed mobility: Needs Assistance Bed Mobility: Supine to Sit Supine to sit: Supervision, HOB elevated (use of rail) General bed mobility comments: HOB elevated, reliance on rail, increased time and assist for trunk positioning Transfers Overall transfer level: Needs assistance Equipment used: Rolling walker (2 wheeled) Transfers: Sit to/from Stand Sit to Stand: Min assist Stand pivot transfers: Min assist General transfer comment: min assist to rise and for balance, ataxic quality to movement Ambulation/Gait Ambulation/Gait assistance: Mod assist, +2 physical assistance Ambulation Distance (Feet): 80 Feet (x2 with seated rest break) Assistive device: Rolling walker (2 wheeled) Gait Pattern/deviations: Step-to pattern, Decreased stride length, Ataxic, Decreased dorsiflexion - right, Narrow base of support, Scissoring Gait velocity: decreased Gait velocity interpretation: Below normal speed for age/gender General Gait Details: patient remains significantly ataxic with mobility, unable to ambulate safely without bilateral +2 assist, patient with poor coordination of drop foot during ambulation. Specific gait training cues for positioning of stride, cued to increase width between LEs (scissoring gait at times). Verbal and tactile cues for posture during gait retraining.     ADL: ADL Overall ADL's : Needs assistance/impaired Eating/Feeding: Set up, Sitting Grooming: Wash/dry hands, Wash/dry face, Brushing hair, Set up, Sitting Upper Body Bathing: Minimal assitance,  Sitting Lower Body Bathing: Moderate assistance, Sit to/from stand Upper Body Dressing : Minimal assistance, Sitting Lower Body Dressing: Moderate assistance, Sit to/from stand Toilet Transfer: Minimal assistance, Buyer, retail Details (indicate cue type and reason): simulated bed to chair Toileting- Clothing Manipulation and Hygiene: Maximal assistance, Sit to/from stand General ADL Comments: Pt able to reach down to socks to pull them up, but not start them over his toes.  Cognition: Cognition Overall Cognitive Status: Impaired/Different from baseline Arousal/Alertness: Awake/alert Orientation Level: Oriented X4 Attention: Divided Divided Attention: Appears intact Memory: Appears intact Awareness: Impaired Awareness Impairment: Emergent impairment Problem Solving: Appears intact Cognition Arousal/Alertness: Awake/alert Behavior During Therapy: WFL for tasks assessed/performed, Impulsive Overall Cognitive Status: Impaired/Different from baseline Area of Impairment: Safety/judgement Safety/Judgement: Decreased awareness of deficits, Decreased awareness of safety General Comments: Patient slightly more impulsive this session, nsg aware  Physical Exam: Blood pressure 144/63, pulse 66, temperature 98.4 F (36.9 C), temperature source Oral, resp. rate 20, height '5\' 10"'  (1.778 m), weight 67.1 kg (147 lb 14.9 oz), SpO2 98 %. Physical Exam  Gen:no acute distress,  HENT:edentulous. Oral mucosa pink and moist Head: Normocephalic.  Eyes: EOM are normal.  Neck: Normal range of motion. Neck supple. No thyromegaly present.  Cardiovascular: Normal rate and regular rhythm.no murmurs or rubs  Respiratory: Effort normal and breath sounds normal. No respiratory distress. no wheezes GI: Soft. Bowel sounds are normal. He exhibits no distension. Non-tender Neurological: He is alert.  Speech is very dysarthric but intelligibility improved today. He is able to provide his name  and age as well as date of birth. Followed simple commands. Right limb ataxia, right PD. RUE motor  4 to 4+/5 delt,bic,tri,wrist, hand.  RLE: 4/5 hf,ke. 0 to trace ADF and 3-4/5 APF. Sensation intact to pain on right side Skin: Skin is warm and dry.  Psych: pt pleasant and approriate   Results for orders placed or performed during the hospital encounter of 04/16/14 (from the past 48 hour(s))  Glucose, capillary     Status: Abnormal   Collection Time: 04/18/14 11:06 AM  Result Value Ref Range   Glucose-Capillary 150 (H) 70 - 99 mg/dL  Glucose, capillary     Status: Abnormal   Collection Time: 04/18/14  4:13 PM  Result Value Ref Range   Glucose-Capillary 217 (H) 70 - 99 mg/dL  Glucose, capillary     Status: Abnormal   Collection Time: 04/18/14  9:58 PM  Result Value Ref Range   Glucose-Capillary 182 (H) 70 - 99 mg/dL   Comment 1 Notify RN    Comment 2 Document in Chart   Basic metabolic panel     Status: Abnormal   Collection Time: 04/19/14  6:58 AM  Result Value Ref Range   Sodium 137 135 - 145 mmol/L   Potassium 3.7 3.5 - 5.1 mmol/L   Chloride 109 96 - 112 mmol/L   CO2 25 19 - 32 mmol/L   Glucose, Bld 167 (H) 70 - 99 mg/dL   BUN 24 (H) 6 - 23 mg/dL   Creatinine, Ser 1.36 (H) 0.50 - 1.35 mg/dL   Calcium 8.4 8.4 - 10.5 mg/dL   GFR calc non Af Amer 52 (L) >90 mL/min   GFR calc Af Amer 60 (L) >90 mL/min    Comment: (NOTE) The eGFR has been calculated using the CKD EPI equation. This calculation has not been validated in all clinical situations. eGFR's persistently <90 mL/min signify possible Chronic Kidney Disease.    Anion gap 3 (L) 5 - 15  Glucose, capillary     Status: Abnormal   Collection Time: 04/19/14  7:10 AM  Result Value Ref Range   Glucose-Capillary 144 (H) 70 - 99 mg/dL   Comment 1 Notify RN    Comment 2 Document in Chart   Glucose, capillary     Status: Abnormal   Collection Time: 04/19/14 11:24 AM  Result Value Ref Range   Glucose-Capillary 147 (H) 70 - 99  mg/dL   Comment 1 Notify RN    Comment 2 Document in Chart   Glucose, capillary     Status: Abnormal   Collection Time: 04/19/14  4:41 PM  Result Value Ref Range   Glucose-Capillary 162 (H) 70 - 99 mg/dL   Comment 1 Notify RN    Comment 2 Document in Chart   Glucose, capillary     Status: Abnormal   Collection Time: 04/19/14  9:50 PM  Result Value Ref Range   Glucose-Capillary 188 (H) 70 - 99 mg/dL   Comment 1 Notify RN    Comment 2 Document in Chart   Glucose, capillary     Status: Abnormal   Collection Time: 04/20/14  6:53 AM  Result Value Ref Range   Glucose-Capillary 167 (H) 70 - 99 mg/dL   Comment 1 Notify RN    Comment 2 Document in Chart    Ct Angio Head W/cm &/or Wo Cm  04/19/2014   CLINICAL DATA:  Slurred speech and RIGHT-sided weakness for 2 weeks, worsening speech difficulties today. History of hypertension, diabetes, hyperlipidemia, sarcoma.  EXAM: CT ANGIOGRAPHY HEAD AND NECK  TECHNIQUE: Multidetector CT imaging of the head and neck was  performed using the standard protocol during bolus administration of intravenous contrast. Multiplanar CT image reconstructions and MIPs were obtained to evaluate the vascular anatomy. Carotid stenosis measurements (when applicable) are obtained utilizing NASCET criteria, using the distal internal carotid diameter as the denominator.  CONTRAST:  1m OMNIPAQUE IOHEXOL 350 MG/ML SOLN  COMPARISON:  MRI of the head April 16 2014  FINDINGS: CT HEAD  The ventricles and sulci are normal for age. No intraparenchymal hemorrhage, mass effect nor midline shift. Patchy supratentorial white matter hypodensities are within normal range for patient's age and though non-specific suggest sequelae of chronic small vessel ischemic disease. Wedge-like hypodensity RIGHT mid to superior cerebellum. RIGHT basal ganglia lacunar infarct was present previously, similar appearance of bilateral thalamus small lacunar infarcts. No abnormal intracranial enhancement.  No  abnormal extra-axial fluid collections. Basal cisterns are patent. Minimal calcific atherosclerosis of the carotid siphons.  No skull fracture. Persistent metopic suture. The included ocular globes and orbital contents are non-suspicious. The mastoid aircells and included paranasal sinuses are well-aerated. Patient is edentulous.  CTA NECK  Normal appearance of the thoracic arch, normal branch pattern. The origins of the innominate, left Common carotid artery and subclavian artery are widely patent, intimal thickening partially obscured by streak artifact from retained LEFT subclavian venous contrast.  Bilateral Common carotid arteries are widely patent, coursing in a straight line fashion. Calcific atherosclerosis and eccentric intimal hematoma results in approximately 60% narrowing of RIGHT internal carotid artery by NASCET criteria, approximate 13 mm segment of stenosis beginning at the origin. Normal appearance of the mid to distal included internal carotid arteries.  Reflux of contrast limits evaluation the vertebral artery origins. Left vertebral artery is dominant. Suspected mid to high-grade stenosis the origin of LEFT vertebral artery with mild poststenotic dilatation.  No dissection, no pseudoaneurysm. No abnormal luminal irregularity. No contrast extravasation.  Soft tissues are unremarkable. No acute osseous process though bone windows have not been submitted.  CTA HEAD  Anterior circulation: Patent cervical internal carotid arteries, petrous, cavernous and supra clinoid internal carotid arteries. Intimal thickening results in at least mid grade stenosis anterior genu of the RIGHT carotid siphon. Widely patent anterior communicating artery. Congenitally dominant LEFT A1 segment. Suspected occlusion of RIGHT A1 segment. High-grade stenosis of proximal RIGHT M2 branch. Moderate luminal irregularity of the anterior and middle cerebral arteries.  Posterior circulation: LEFT vertebral artery is dominant. Mid  grade stenosis of RIGHT vertebral artery which predominately in terminates in the RIGHT posterior inferior cerebellar artery, with occluded distal thready RIGHT vertebral artery. Mild stenosis LEFT vertebral artery. Widely patent basilar artery. High-grade stenosis suspected of RIGHT superior cerebellar artery origin with moderate luminal irregularity of the RIGHT greater than LEFT superior cerebellar arterys. RIGHT vertebral artery terminates in the posterior inferior cerebellar artery. Patent posterior cerebral arteries with multifocal mid high-grade stenosis of the LEFT P2 segment. High-grade stenosis of RIGHT P1 segment with thready RIGHT posterior communicating artery present, moderate luminal regularity of the mid to distal RIGHT posterior cerebral artery.  No large vessel occlusion, dissection, luminal irregularity, contrast extravasation or aneurysm within the anterior nor posterior circulation.  IMPRESSION: CT HEAD: Evolving RIGHT superior cerebellar artery territory infarct.  Remote RIGHT basal ganglion and bilateral thalamus lacunar infarcts. Mild white matter changes suggest chronic small vessel ischemic disease.  CTA NECK: Approximately 60% stenosis RIGHT internal carotid artery by NASCET criteria. No large vessel occlusion.  CTA HEAD: Mid grade stenosis of RIGHT anterior genu of the carotid siphon, suspected occlusion of RIGHT A1 segment  with patent A-comm artery. High-grade stenosis of proximal RIGHT M2 branch.  Mid grade stenosis of RIGHT vertebral artery, which predominately at terminates in the RIGHT pack of, and included distal thready RIGHT vertebral artery. Suspected high-grade stenosis of the RIGHT superior cerebellar artery origin. Multifocal high-grade stenosis of LEFT P2 segment. High-grade stenosis of RIGHT P1 segment with RIGHT posterior communicating artery present.  Moderate luminal regularity of the cerebral arteries most consistent with atherosclerosis.   Electronically Signed   By:  Elon Alas   On: 04/19/2014 04:03   Ct Angio Neck W/cm &/or Wo/cm  04/19/2014   CLINICAL DATA:  Slurred speech and RIGHT-sided weakness for 2 weeks, worsening speech difficulties today. History of hypertension, diabetes, hyperlipidemia, sarcoma.  EXAM: CT ANGIOGRAPHY HEAD AND NECK  TECHNIQUE: Multidetector CT imaging of the head and neck was performed using the standard protocol during bolus administration of intravenous contrast. Multiplanar CT image reconstructions and MIPs were obtained to evaluate the vascular anatomy. Carotid stenosis measurements (when applicable) are obtained utilizing NASCET criteria, using the distal internal carotid diameter as the denominator.  CONTRAST:  98m OMNIPAQUE IOHEXOL 350 MG/ML SOLN  COMPARISON:  MRI of the head April 16 2014  FINDINGS: CT HEAD  The ventricles and sulci are normal for age. No intraparenchymal hemorrhage, mass effect nor midline shift. Patchy supratentorial white matter hypodensities are within normal range for patient's age and though non-specific suggest sequelae of chronic small vessel ischemic disease. Wedge-like hypodensity RIGHT mid to superior cerebellum. RIGHT basal ganglia lacunar infarct was present previously, similar appearance of bilateral thalamus small lacunar infarcts. No abnormal intracranial enhancement.  No abnormal extra-axial fluid collections. Basal cisterns are patent. Minimal calcific atherosclerosis of the carotid siphons.  No skull fracture. Persistent metopic suture. The included ocular globes and orbital contents are non-suspicious. The mastoid aircells and included paranasal sinuses are well-aerated. Patient is edentulous.  CTA NECK  Normal appearance of the thoracic arch, normal branch pattern. The origins of the innominate, left Common carotid artery and subclavian artery are widely patent, intimal thickening partially obscured by streak artifact from retained LEFT subclavian venous contrast.  Bilateral Common carotid  arteries are widely patent, coursing in a straight line fashion. Calcific atherosclerosis and eccentric intimal hematoma results in approximately 60% narrowing of RIGHT internal carotid artery by NASCET criteria, approximate 13 mm segment of stenosis beginning at the origin. Normal appearance of the mid to distal included internal carotid arteries.  Reflux of contrast limits evaluation the vertebral artery origins. Left vertebral artery is dominant. Suspected mid to high-grade stenosis the origin of LEFT vertebral artery with mild poststenotic dilatation.  No dissection, no pseudoaneurysm. No abnormal luminal irregularity. No contrast extravasation.  Soft tissues are unremarkable. No acute osseous process though bone windows have not been submitted.  CTA HEAD  Anterior circulation: Patent cervical internal carotid arteries, petrous, cavernous and supra clinoid internal carotid arteries. Intimal thickening results in at least mid grade stenosis anterior genu of the RIGHT carotid siphon. Widely patent anterior communicating artery. Congenitally dominant LEFT A1 segment. Suspected occlusion of RIGHT A1 segment. High-grade stenosis of proximal RIGHT M2 branch. Moderate luminal irregularity of the anterior and middle cerebral arteries.  Posterior circulation: LEFT vertebral artery is dominant. Mid grade stenosis of RIGHT vertebral artery which predominately in terminates in the RIGHT posterior inferior cerebellar artery, with occluded distal thready RIGHT vertebral artery. Mild stenosis LEFT vertebral artery. Widely patent basilar artery. High-grade stenosis suspected of RIGHT superior cerebellar artery origin with moderate luminal irregularity of the  RIGHT greater than LEFT superior cerebellar arterys. RIGHT vertebral artery terminates in the posterior inferior cerebellar artery. Patent posterior cerebral arteries with multifocal mid high-grade stenosis of the LEFT P2 segment. High-grade stenosis of RIGHT P1 segment  with thready RIGHT posterior communicating artery present, moderate luminal regularity of the mid to distal RIGHT posterior cerebral artery.  No large vessel occlusion, dissection, luminal irregularity, contrast extravasation or aneurysm within the anterior nor posterior circulation.  IMPRESSION: CT HEAD: Evolving RIGHT superior cerebellar artery territory infarct.  Remote RIGHT basal ganglion and bilateral thalamus lacunar infarcts. Mild white matter changes suggest chronic small vessel ischemic disease.  CTA NECK: Approximately 60% stenosis RIGHT internal carotid artery by NASCET criteria. No large vessel occlusion.  CTA HEAD: Mid grade stenosis of RIGHT anterior genu of the carotid siphon, suspected occlusion of RIGHT A1 segment with patent A-comm artery. High-grade stenosis of proximal RIGHT M2 branch.  Mid grade stenosis of RIGHT vertebral artery, which predominately at terminates in the RIGHT pack of, and included distal thready RIGHT vertebral artery. Suspected high-grade stenosis of the RIGHT superior cerebellar artery origin. Multifocal high-grade stenosis of LEFT P2 segment. High-grade stenosis of RIGHT P1 segment with RIGHT posterior communicating artery present.  Moderate luminal regularity of the cerebral arteries most consistent with atherosclerosis.   Electronically Signed   By: Elon Alas   On: 04/19/2014 04:03       Medical Problem List and Plan: 1. 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  DVT Prophylaxis/Anticoagulation: Subcutaneous Lovenox. Monitor platelet counts and any signs of bleeding. Venous Dopplers negative 3. Pain Management: Lyrica 50 mg twice a day Tylenol as needed 4. Hypertension. Coreg 6.25 mg twice a day,BIDIL 20-30 7.5 mg 3 times a day. Monitor with increased mobility  5. Neuropsych: This patient is  capable of making decisions on his  own behalf. 6. Skin/Wound Care: Routine skin checks  7.  Fluids/Electrolytes/Nutrition: Strict I&O's with follow-up chemistries 8. Diabetes mellitus with peripheral neuropathy. Latest hemoglobin A1c 7.9. Presently with sliding scale insulin. Patient on Glucophage 1000 mg twice a day prior to admission. Resume as tolerated Check blood sugars before meals and at bedtime. Diabetic teaching 9. Question left lower lobe pneumonia. Continue antibiotic therapy for now with Augmentin 6 days. Encourage incentive spirometry. Follow-up chest x-ray as needed 10. Acute on chronic renal insufficiency. Latest creatinine 1.54. Renal ultrasound negative. Follow-up chemistries 11. Dysphagia. Diet changed to mechanical soft nectar liquids after modified barium swallow 04/21/2014. Follow-up speech therapy   Post Admission Physician Evaluation: 1. Functional deficits secondary  to right cerebellar infarct as well other scattered subcortical infarcts--?embolic source. 2. Patient is admitted to receive collaborative, interdisciplinary care between the physiatrist, rehab nursing staff, and therapy team. 3. Patient's level of medical complexity and substantial therapy needs in context of that medical necessity cannot be provided at a lesser intensity of care such as a SNF. 4. Patient has experienced substantial functional loss from his/her baseline which was documented above under the "Functional History" and "Functional Status" headings.  Judging by the patient's diagnosis, physical exam, and functional history, the patient has potential for functional progress which will result in measurable gains while on inpatient rehab.  These gains will be of substantial and practical use upon discharge  in facilitating mobility and self-care at the household level. 5. Physiatrist will provide 24 hour management of medical needs as well as oversight of the therapy plan/treatment and provide guidance as appropriate regarding the interaction of the two.  6. 24 hour rehab nursing will assist with  bladder management, bowel management, safety, skin/wound care, disease management, medication administration, pain management and patient education  and help integrate therapy concepts, techniques,education, etc. 7. PT will assess and treat for/with: Lower extremity strength, range of motion, stamina, balance, functional mobility, safety, adaptive techniques and equipment, NMR, vestibular assessment/mgt, pain control, ego support, family ed.   Goals are: supervision to min assist. 8. OT will assess and treat for/with: ADL's, functional mobility, safety, upper extremity strength, adaptive techniques and equipment, NMR, vestibular assessment/mgt,balance, ego support, community reintegration.   Goals are: supervision to min assist. Therapy may proceed with showering this patient. 9. SLP will assess and treat for/with: speech, swallowing, communication.  Goals are: supervision to mod I. 10. Case Management and Social Worker will assess and treat for psychological issues and discharge planning. 11. Team conference will be held weekly to assess progress toward goals and to determine barriers to discharge. 12. Patient will receive at least 3 hours of therapy per day at least 5 days per week. 13. ELOS: 12-16 days       14. Prognosis:  excellent     Meredith Staggers, MD, [facility] 04/21/2014   04/20/2014

## 2014-04-20 NOTE — Progress Notes (Signed)
ANTIBIOTIC CONSULT NOTE - INITIAL  Pharmacy Consult for unasyn Indication: aspiration PNA  No Known Allergies  Patient Measurements: Height: 5\' 10"  (177.8 cm) Weight: 147 lb 14.9 oz (67.1 kg) IBW/kg (Calculated) : 73   Vital Signs: Temp: 98.1 F (36.7 C) (04/12 1131) Temp Source: Oral (04/12 1131) BP: 116/72 mmHg (04/12 1131) Pulse Rate: 62 (04/12 1131) Intake/Output from previous day: 04/11 0701 - 04/12 0700 In: 360 [P.O.:360] Out: 875 [Urine:875] Intake/Output from this shift:    Labs:  Recent Labs  04/18/14 0619 04/19/14 0658 04/20/14 0714  CREATININE 1.19 1.36* 1.55*   Estimated Creatinine Clearance: 42.7 mL/min (by C-G formula based on Cr of 1.55). No results for input(s): VANCOTROUGH, VANCOPEAK, VANCORANDOM, GENTTROUGH, GENTPEAK, GENTRANDOM, TOBRATROUGH, TOBRAPEAK, TOBRARND, AMIKACINPEAK, AMIKACINTROU, AMIKACIN in the last 72 hours.   Microbiology: No results found for this or any previous visit (from the past 720 hour(s)).  Medical History: Past Medical History  Diagnosis Date  . Type II diabetes mellitus   . Sarcoma     a. R leg  . Diabetic peripheral neuropathy     a. both feet  . TIA (transient ischemic attack)     a. 08/2010.  Marland Kitchen Hyperlipidemia   . Hypertension   . Stroke     a. 04/2014 multiple bateral infarcts, presumed to be embolic;  b. [id number] Carotid U/S: 1-39% bilat ICA stenosis.  . H/O echocardiogram     a. 04/2014 Echo: EF 55-60%, no rwma.  Marland Kitchen ETOH abuse     a. 04/2014 18-24 beers q weekend.    Assessment: 69 yo M admitted with stroke.  On 4/12 he developed increased cough, low grade fevers and CXR: LLL PNA.  Most likely aspiration PNA.  Pharmacy consulted to dose Unasyn for aspiration PNA.  Creat 1.55, wt 67 kg. Last WBC on 4/9 = 9. afebrile  Plan:  -Unasyn 3 gm IV q8h -pharmacy to sign off Thanks  Eudelia Bunch, Pharm.D. [id number] 04/20/2014 12:41 PM

## 2014-04-21 ENCOUNTER — Inpatient Hospital Stay (HOSPITAL_COMMUNITY)
Admission: RE | Admit: 2014-04-21 | Discharge: 2014-05-04 | DRG: 056 | Disposition: A | Discharge status: Skilled Nursing Facility | Payer: Medicare Other | Source: Intra-hospital | Attending: Physical Medicine & Rehabilitation | Admitting: Physical Medicine & Rehabilitation

## 2014-04-21 ENCOUNTER — Encounter (HOSPITAL_COMMUNITY): Payer: Self-pay | Admitting: Internal Medicine

## 2014-04-21 ENCOUNTER — Inpatient Hospital Stay (HOSPITAL_COMMUNITY): Payer: Medicare Other

## 2014-04-21 DIAGNOSIS — Z79899 Other long term (current) drug therapy: Secondary | ICD-10-CM

## 2014-04-21 DIAGNOSIS — E11649 Type 2 diabetes mellitus with hypoglycemia without coma: Secondary | ICD-10-CM | POA: Diagnosis not present

## 2014-04-21 DIAGNOSIS — I69920 Aphasia following unspecified cerebrovascular disease: Secondary | ICD-10-CM | POA: Diagnosis not present

## 2014-04-21 DIAGNOSIS — E1165 Type 2 diabetes mellitus with hyperglycemia: Secondary | ICD-10-CM | POA: Diagnosis not present

## 2014-04-21 DIAGNOSIS — N401 Enlarged prostate with lower urinary tract symptoms: Secondary | ICD-10-CM | POA: Diagnosis not present

## 2014-04-21 DIAGNOSIS — I639 Cerebral infarction, unspecified: Secondary | ICD-10-CM

## 2014-04-21 DIAGNOSIS — E1142 Type 2 diabetes mellitus with diabetic polyneuropathy: Secondary | ICD-10-CM

## 2014-04-21 DIAGNOSIS — R4701 Aphasia: Secondary | ICD-10-CM | POA: Diagnosis present

## 2014-04-21 DIAGNOSIS — R339 Retention of urine, unspecified: Secondary | ICD-10-CM | POA: Diagnosis not present

## 2014-04-21 DIAGNOSIS — J189 Pneumonia, unspecified organism: Secondary | ICD-10-CM

## 2014-04-21 DIAGNOSIS — I10 Essential (primary) hypertension: Secondary | ICD-10-CM | POA: Diagnosis not present

## 2014-04-21 DIAGNOSIS — R1310 Dysphagia, unspecified: Secondary | ICD-10-CM | POA: Diagnosis not present

## 2014-04-21 DIAGNOSIS — N289 Disorder of kidney and ureter, unspecified: Secondary | ICD-10-CM

## 2014-04-21 DIAGNOSIS — I69391 Dysphagia following cerebral infarction: Secondary | ICD-10-CM | POA: Diagnosis not present

## 2014-04-21 DIAGNOSIS — Z8673 Personal history of transient ischemic attack (TIA), and cerebral infarction without residual deficits: Secondary | ICD-10-CM

## 2014-04-21 DIAGNOSIS — R338 Other retention of urine: Secondary | ICD-10-CM | POA: Diagnosis not present

## 2014-04-21 DIAGNOSIS — K5900 Constipation, unspecified: Secondary | ICD-10-CM | POA: Diagnosis not present

## 2014-04-21 DIAGNOSIS — N189 Chronic kidney disease, unspecified: Secondary | ICD-10-CM | POA: Diagnosis not present

## 2014-04-21 DIAGNOSIS — Z833 Family history of diabetes mellitus: Secondary | ICD-10-CM

## 2014-04-21 DIAGNOSIS — E1140 Type 2 diabetes mellitus with diabetic neuropathy, unspecified: Secondary | ICD-10-CM | POA: Diagnosis not present

## 2014-04-21 DIAGNOSIS — I129 Hypertensive chronic kidney disease with stage 1 through stage 4 chronic kidney disease, or unspecified chronic kidney disease: Secondary | ICD-10-CM

## 2014-04-21 DIAGNOSIS — E785 Hyperlipidemia, unspecified: Secondary | ICD-10-CM

## 2014-04-21 DIAGNOSIS — E1149 Type 2 diabetes mellitus with other diabetic neurological complication: Secondary | ICD-10-CM | POA: Diagnosis present

## 2014-04-21 DIAGNOSIS — K5901 Slow transit constipation: Secondary | ICD-10-CM | POA: Diagnosis not present

## 2014-04-21 DIAGNOSIS — N319 Neuromuscular dysfunction of bladder, unspecified: Secondary | ICD-10-CM | POA: Diagnosis not present

## 2014-04-21 LAB — CBC
HCT: 31.9 % — ABNORMAL LOW (ref 39.0–52.0)
HEMATOCRIT: 33.8 % — AB (ref 39.0–52.0)
Hemoglobin: 10.6 g/dL — ABNORMAL LOW (ref 13.0–17.0)
Hemoglobin: 11.1 g/dL — ABNORMAL LOW (ref 13.0–17.0)
MCH: 28 pg (ref 26.0–34.0)
MCH: 28 pg (ref 26.0–34.0)
MCHC: 33.2 g/dL (ref 30.0–36.0)
MCHC: 32.8 g/dL (ref 30.0–36.0)
MCV: 84.4 fL (ref 78.0–100.0)
MCV: 85.1 fL (ref 78.0–100.0)
PLATELETS: 231 10*3/uL (ref 150–400)
Platelets: 212 10*3/uL (ref 150–400)
RBC: 3.78 MIL/uL — AB (ref 4.22–5.81)
RBC: 3.97 MIL/uL — AB (ref 4.22–5.81)
RDW: 13.1 % (ref 11.5–15.5)
RDW: 13.2 % (ref 11.5–15.5)
WBC: 5.7 10*3/uL (ref 4.0–10.5)
WBC: 6 10*3/uL (ref 4.0–10.5)

## 2014-04-21 LAB — HEMOGLOBIN A1C
HEMOGLOBIN A1C: 9 % — AB (ref 4.8–5.6)
Mean Plasma Glucose: 212 mg/dL

## 2014-04-21 LAB — GLUCOSE, CAPILLARY
GLUCOSE-CAPILLARY: 159 mg/dL — AB (ref 70–99)
Glucose-Capillary: 134 mg/dL — ABNORMAL HIGH (ref 70–99)
Glucose-Capillary: 142 mg/dL — ABNORMAL HIGH (ref 70–99)
Glucose-Capillary: 189 mg/dL — ABNORMAL HIGH (ref 70–99)

## 2014-04-21 LAB — BASIC METABOLIC PANEL
Anion gap: 6 (ref 5–15)
BUN: 25 mg/dL — ABNORMAL HIGH (ref 6–23)
CO2: 22 mmol/L (ref 19–32)
Calcium: 8.3 mg/dL — ABNORMAL LOW (ref 8.4–10.5)
Chloride: 115 mmol/L — ABNORMAL HIGH (ref 96–112)
Creatinine, Ser: 1.54 mg/dL — ABNORMAL HIGH (ref 0.50–1.35)
GFR calc Af Amer: 51 mL/min — ABNORMAL LOW (ref >90)
GFR calc non Af Amer: 44 mL/min — ABNORMAL LOW (ref >90)
Glucose, Bld: 148 mg/dL — ABNORMAL HIGH (ref 70–99)
Potassium: 3.7 mmol/L (ref 3.5–5.1)
Sodium: 143 mmol/L (ref 135–145)

## 2014-04-21 LAB — LEGIONELLA ANTIGEN, URINE

## 2014-04-21 LAB — CREATININE, SERUM
Creatinine, Ser: 1.53 mg/dL — ABNORMAL HIGH (ref 0.50–1.35)
GFR, EST AFRICAN AMERICAN: 52 mL/min — AB (ref >90)
GFR, EST NON AFRICAN AMERICAN: 45 mL/min — AB (ref >90)

## 2014-04-21 LAB — HIV ANTIBODY (ROUTINE TESTING W REFLEX): HIV Screen 4th Generation wRfx: NONREACTIVE

## 2014-04-21 MED ORDER — RESOURCE THICKENUP CLEAR PO POWD
ORAL | Status: DC | PRN
  Filled 2014-04-21 (×2): qty 125

## 2014-04-21 MED ORDER — ATORVASTATIN CALCIUM 40 MG PO TABS
40.0000 mg | ORAL_TABLET | Freq: Every day | ORAL | Status: DC
  Administered 2014-04-21 – 2014-05-03 (×13): 40 mg via ORAL
  Filled 2014-04-21 (×14): qty 1

## 2014-04-21 MED ORDER — SENNOSIDES-DOCUSATE SODIUM 8.6-50 MG PO TABS
1.0000 | ORAL_TABLET | Freq: Every evening | ORAL | Status: DC | PRN
  Filled 2014-04-21: qty 1

## 2014-04-21 MED ORDER — BISACODYL 10 MG RE SUPP
10.0000 mg | Freq: Every day | RECTAL | Status: DC | PRN
  Administered 2014-05-02: 10 mg via RECTAL
  Filled 2014-04-21: qty 1

## 2014-04-21 MED ORDER — CETYLPYRIDINIUM CHLORIDE 0.05 % MT LIQD
7.0000 mL | Freq: Two times a day (BID) | OROMUCOSAL | Status: DC
  Administered 2014-04-21: 7 mL via OROMUCOSAL

## 2014-04-21 MED ORDER — ASPIRIN 81 MG PO TBEC: 81.0000 mg | DELAYED_RELEASE_TABLET | Freq: Every day | ORAL | Status: DC

## 2014-04-21 MED ORDER — ATORVASTATIN CALCIUM 40 MG PO TABS
40.0000 mg | ORAL_TABLET | Freq: Every day | ORAL | Status: DC
Start: 2014-04-21 — End: 2020-10-25

## 2014-04-21 MED ORDER — CARVEDILOL 6.25 MG PO TABS
6.2500 mg | ORAL_TABLET | Freq: Two times a day (BID) | ORAL | Status: DC
  Administered 2014-04-21 – 2014-05-04 (×26): 6.25 mg via ORAL
  Filled 2014-04-21 (×28): qty 1

## 2014-04-21 MED ORDER — CLOPIDOGREL BISULFATE 75 MG PO TABS
75.0000 mg | ORAL_TABLET | Freq: Every day | ORAL | Status: DC
  Administered 2014-04-22 – 2014-05-04 (×13): 75 mg via ORAL
  Filled 2014-04-21 (×14): qty 1

## 2014-04-21 MED ORDER — INSULIN ASPART 100 UNIT/ML ~~LOC~~ SOLN
0.0000 [IU] | Freq: Every day | SUBCUTANEOUS | Status: DC
  Administered 2014-04-23 – 2014-04-26 (×4): 2 [IU] via SUBCUTANEOUS

## 2014-04-21 MED ORDER — ASPIRIN EC 81 MG PO TBEC
81.0000 mg | DELAYED_RELEASE_TABLET | Freq: Every day | ORAL | Status: DC
  Administered 2014-04-22 – 2014-05-04 (×13): 81 mg via ORAL
  Filled 2014-04-21 (×14): qty 1

## 2014-04-21 MED ORDER — CARVEDILOL 6.25 MG PO TABS: 6.2500 mg | ORAL_TABLET | Freq: Two times a day (BID) | ORAL | Status: DC

## 2014-04-21 MED ORDER — ISOSORB DINITRATE-HYDRALAZINE 20-37.5 MG PO TABS: 1.0000 | ORAL_TABLET | Freq: Three times a day (TID) | ORAL | Status: DC

## 2014-04-21 MED ORDER — SODIUM CHLORIDE 0.9 % IV BOLUS (SEPSIS)
1000.0000 mL | Freq: Once | INTRAVENOUS | Status: AC
  Administered 2014-04-21: 1000 mL via INTRAVENOUS

## 2014-04-21 MED ORDER — ONDANSETRON HCL 4 MG/2ML IJ SOLN: 4.0000 mg | Freq: Four times a day (QID) | INTRAMUSCULAR | Status: DC | PRN

## 2014-04-21 MED ORDER — INSULIN ASPART 100 UNIT/ML ~~LOC~~ SOLN: SUBCUTANEOUS | Status: DC

## 2014-04-21 MED ORDER — ONDANSETRON HCL 4 MG PO TABS: 4.0000 mg | ORAL_TABLET | Freq: Four times a day (QID) | ORAL | Status: DC | PRN

## 2014-04-21 MED ORDER — AMOXICILLIN-POT CLAVULANATE 875-125 MG PO TABS
1.0000 | ORAL_TABLET | Freq: Two times a day (BID) | ORAL | Status: DC
  Administered 2014-04-21 – 2014-04-25 (×9): 1 via ORAL
  Filled 2014-04-21 (×12): qty 1

## 2014-04-21 MED ORDER — ACETAMINOPHEN 325 MG PO TABS: 650.0000 mg | ORAL_TABLET | Freq: Four times a day (QID) | ORAL | Status: DC | PRN

## 2014-04-21 MED ORDER — AMOXICILLIN-POT CLAVULANATE 875-125 MG PO TABS: 1.0000 | ORAL_TABLET | Freq: Two times a day (BID) | ORAL | Status: DC

## 2014-04-21 MED ORDER — ENOXAPARIN SODIUM 40 MG/0.4ML ~~LOC~~ SOLN
40.0000 mg | SUBCUTANEOUS | Status: DC
Start: 2014-04-21 — End: 2014-04-21

## 2014-04-21 MED ORDER — PREGABALIN 50 MG PO CAPS
50.0000 mg | ORAL_CAPSULE | Freq: Two times a day (BID) | ORAL | Status: DC
  Administered 2014-04-21 – 2014-05-04 (×26): 50 mg via ORAL
  Filled 2014-04-21 (×26): qty 1

## 2014-04-21 MED ORDER — ISOSORB DINITRATE-HYDRALAZINE 20-37.5 MG PO TABS
1.0000 | ORAL_TABLET | Freq: Three times a day (TID) | ORAL | Status: DC
  Administered 2014-04-21 – 2014-05-04 (×37): 1 via ORAL
  Filled 2014-04-21 (×41): qty 1

## 2014-04-21 MED ORDER — CLOPIDOGREL BISULFATE 75 MG PO TABS: 75.0000 mg | ORAL_TABLET | Freq: Every day | ORAL | Status: DC

## 2014-04-21 MED ORDER — SORBITOL 70 % SOLN: 30.0000 mL | Freq: Every day | Status: DC | PRN

## 2014-04-21 MED ORDER — ENOXAPARIN SODIUM 40 MG/0.4ML ~~LOC~~ SOLN
40.0000 mg | SUBCUTANEOUS | Status: DC
  Administered 2014-04-21 – 2014-05-03 (×13): 40 mg via SUBCUTANEOUS
  Filled 2014-04-21 (×14): qty 0.4

## 2014-04-21 NOTE — Discharge Summary (Signed)
Hayden Mckinney, is a 69 y.o. male  DOB [birth year]  MRN [id number].  Admission date:  04/16/2014  Admitting Physician  Merton Border, MD  Discharge Date:  04/21/2014   Primary MD  No primary care provider on file.  Recommendations for primary care physician for things to follow:   Monitor secondary risk factors for stroke. Please schedule below mentioned appointments with subspecialists.   Admission Diagnosis  Stroke [I63.9] Speech abnormality [R47.9]   Discharge Diagnosis  Stroke [I63.9] Speech abnormality [R47.9]    Principal Problem:   Embolic stroke Active Problems:   Type II diabetes mellitus with neurological manifestations   HTN (hypertension)   Hyperlipidemia   Stroke   Speech abnormality   Aspiration pneumonia   Acute kidney injury      Past Medical History  Diagnosis Date  . Type II diabetes mellitus   . Sarcoma     a. R leg  . Diabetic peripheral neuropathy     a. both feet  . TIA (transient ischemic attack)     a. 08/2010.  Marland Kitchen Hyperlipidemia   . Hypertension   . Stroke     a. 04/2014 multiple bateral infarcts, presumed to be embolic;  b. 06/675 Carotid U/S: 1-39% bilat ICA stenosis.  . H/O echocardiogram     a. 04/2014 Echo: EF 55-60%, no rwma.  Marland Kitchen ETOH abuse     a. 04/2014 18-24 beers q weekend.    Past Surgical History  Procedure Laterality Date  . Sarcoma removal    . Repair of r hand after trauma    . Loop recorder implant N/A 04/20/2014    Procedure: LOOP RECORDER IMPLANT;  Surgeon: Thompson Grayer, MD;  Location: [facility] CATH LAB;  Service: Cardiovascular;  Laterality: N/A;       History of present illness and  Hospital Course:     Kindly see H&P for history of present illness and admission details, please review complete Labs, Consult reports and Test reports for all details in  brief  HPI  Hayden Mckinney is a 69 y.o. male, with history hypertension, diabetes, and TIAs who presented with 2 weeks history of increasing weakness, falls and slurred speech. His sister noticed his slurred speech and advised him to come to the hospital. MRI demonstrated a moderate sized acute to subacute infarct in the mid to superior right cerebellum with local mass effect. He also had tiny acute posterior right operculum infarct, subacute partially hemorrhagic infarct of the right lenticular nucleus and right mid coronal radiata. He also had evidence of remote bilateral infarcts, which were partially hemorrhagic. He failed his swallow evaluation and has been admitted for further workup of his strokes and therapy. On 4/10, he had left upper extremity and right lower extremity numbness suggestive of new infarcts and he underwent CTangio head and neck. He has intracranial atherosclerosis and was changed to aspirin plus plavix to continue for three months followed by monotherapy plavix. Neurology, however, still feels he is having embolic strokes given distribution. Telemetry demonstrated NSR. TEE demonstrated  no PFO or intracardiac thrombus. He had a loop recorder implanted on 4/12. On 4/12 he developed increased cough, low-grade fevers and chest x-ray demonstrated left lower lobe pneumonia. Most likely this is aspiration pneumonia. Additionally, his creatinine started trending up again. Because he had not sought medical attention for the last 3 years it is unclear if this is due to acute kidney injury over chronic kidney disease.     Hospital Course    Acute and subacute embolic infarcts with persistent dysarthria and dysphagia. He developed some numbness of the left upper and right lower extremities on 4/10.  - CT angio head and neck: Evolving right superior cerebellar artery territory infarct, remote right basal ganglion and bilateral thalamus lacunar infarcts. - CT neck with  approximate 60% stenosis of the right internal carotid artery, no large vessel occlusion.  - CT angio neck: Multivessel stenoses and occlusions - Echo: Preserved EF, no wall motion abnl or valve dysfunction or obvious PFO, TEE stable. Has underwent loop recorder placement. - Carotid duplex: 1-39% ICA stenosis bilaterally with antegrade vertebral artery flow  On dual antiplatelet therapy. Stop aspirin in 3 months. Continue Plavix and statin indefinitely. Monitor secondary risk factors for stroke.   Outpatient follow-up with neurology in 1-2 months.     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   Dysphagia - due to stroke, seen by speech, currently on dysphagia 3 diet with feeding assistance and aspiration precautions. Needs continued speech follow up.     Hypertension, blood pressures better controlled on Coreg and BiDil continue.   Hyperlipidemia, LDL 162, goal < 70 - Started high dose statin   Diabetes mellitus, type II - A1C of 9, question compliance, he was on Glucophage which has been stopped due to renal insufficiency. Placed on sliding scale with good effect.  CBG (last 3)   Recent Labs  04/20/14 1644 04/20/14 2113 04/21/14 0658  GLUCAP 296* 167* 134*      Renal failure unclear acute versus chronic, creatinine initially 1.8 peak and trended down to 1.19, but now up to 1.55, possibly due to recent IV contrast, dehydration from poor oral intake. Perhaps he has CKD due to his diabetes and high blood pressure, but he has not had bloodwork done in 3 years. His FeNa was <1 he has been adequately hydrated and will hydrate him again before he is discharged. Off note he does have significant proteinuria on UA. He likely has underlying diabetic nephropathy and some element of chronic kidney disease. We will request one time outpatient follow-up with nephrology in the  next 1-2 weeks. Monitor BMP every 5-6 days while at rehabilitation.    Low grade fever, likely due to aspiration pneumonia , responded well to Unasyn. Now switched to Augmentin for 6 more days. Follow aspiration precautions. Monitor clinically.     Discharge Condition: Stable  Follow UP  Follow-up Information    Follow up with [facility] On 04/29/2014.   Why:  at [place] for wound check   Contact information:   [place] [address] [place] [address]       Follow up with Xu,Jindong, MD. Schedule an appointment as soon as possible for a visit in 2 months.   Specialty:  Neurology   Why:  stroke clinic   Contact information:   [address] [place]  [address] [phone]  Follow up with POWELL,ALVIN C, MD. Schedule an appointment as soon as possible for a visit in 1 week.   Specialty:  Nephrology   Why:  Renal failure with proteinuria   Contact information:   [place] [address] [phone]       Follow up with EARLY, TODD, MD. Schedule an appointment as soon as possible for a visit in 1 week.   Specialty:  Vascular Surgery   Why:  Carotid artery disease   Contact information:   [place] [address] [phone]       Follow up with Thompson Grayer, MD In 1 month.   Specialty:  Cardiology   Why:  Loop recorder placement   Contact information:   [place] [address] [place] [address] [phone]         Discharge Instructions  and  Discharge Medications          Discharge Instructions    Ambulatory referral to Neurology    Complete by:  As directed   Pt will follow up with Dr. Erlinda Hong at [facility] in about 2 months. Thanks.     Discharge instructions    Complete by:  As directed   Follow with Primary MD in 7 days   Get CBC, CMP, 2 view Chest X ray checked  by Primary MD next visit.    Activity: As tolerated with Full fall precautions use  walker/cane & assistance as needed   Disposition CIR   Diet: Dysphagia 3 heart healthy low-carb diet with feeding assistance and aspiration precautions.  For Heart failure patients - Check your Weight same time everyday, if you gain over 2 pounds, or you develop in leg swelling, experience more shortness of breath or chest pain, call your Primary MD immediately. Follow Cardiac Low Salt Diet and 1.5 lit/day fluid restriction.   On your next visit with your primary care physician please Get Medicines reviewed and adjusted.   Please request your Prim.MD to go over all Hospital Tests and Procedure/Radiological results at the follow up, please get all Hospital records sent to your Prim MD by signing hospital release before you go home.   If you experience worsening of your admission symptoms, develop shortness of breath, life threatening emergency, suicidal or homicidal thoughts you must seek medical attention immediately by calling 911 or calling your MD immediately  if symptoms less severe.  You Must read complete instructions/literature along with all the possible adverse reactions/side effects for all the Medicines you take and that have been prescribed to you. Take any new Medicines after you have completely understood and accpet all the possible adverse reactions/side effects.   Do not drive, operating heavy machinery, perform activities at heights, swimming or participation in water activities or provide baby sitting services if your were admitted for syncope or siezures until you have seen by Primary MD or a Neurologist and advised to do so again.  Do not drive when taking Pain medications.    Do not take more than prescribed Pain, Sleep and Anxiety Medications  Special Instructions: If you have smoked or chewed Tobacco  in the last 2 yrs please stop smoking, stop any regular Alcohol  and or any Recreational drug use.  Wear Seat belts while driving.   Please note  You were cared  for by a hospitalist  during your hospital stay. If you have any questions about your discharge medications or the care you received while you were in the hospital after you are discharged, you can call the unit and asked to speak with the hospitalist on call if the hospitalist that took care of you is not available. Once you are discharged, your primary care physician will handle any further medical issues. Please note that NO REFILLS for any discharge medications will be authorized once you are discharged, as it is imperative that you return to your primary care physician (or establish a relationship with a primary care physician if you do not have one) for your aftercare needs so that they can reassess your need for medications and monitor your lab values.     Increase activity slowly    Complete by:  As directed             Medication List    STOP taking these medications        lisinopril-hydrochlorothiazide 10-12.5 MG per tablet  Commonly known as:  PRINZIDE     metFORMIN 1000 MG tablet  Commonly known as:  GLUCOPHAGE     pravastatin 80 MG tablet  Commonly known as:  PRAVACHOL      TAKE these medications        amoxicillin-clavulanate 875-125 MG per tablet  Commonly known as:  AUGMENTIN  Take 1 tablet by mouth 2 (two) times daily. 6 more days     aspirin 81 MG EC tablet  Take 1 tablet (81 mg total) by mouth daily.     atorvastatin 40 MG tablet  Commonly known as:  LIPITOR  Take 1 tablet (40 mg total) by mouth daily at 6 PM.     carvedilol 6.25 MG tablet  Commonly known as:  COREG  Take 1 tablet (6.25 mg total) by mouth 2 (two) times daily with a meal.     clopidogrel 75 MG tablet  Commonly known as:  PLAVIX  Take 1 tablet (75 mg total) by mouth daily.     insulin aspart 100 UNIT/ML injection  Commonly known as:  novoLOG  Before each meal 3 times a day, 140-199 - 2 units, 200-250 - 4 units, 251-299 - 6 units,  300-349 - 8 units,  350 or above 10 units.      isosorbide-hydrALAZINE 20-37.5 MG per tablet  Commonly known as:  BIDIL  Take 1 tablet by mouth 3 (three) times daily.     pregabalin 50 MG capsule  Commonly known as:  LYRICA  Take 1 capsule (50 mg total) by mouth 2 (two) times daily.     RELION CONFIRM GLUCOSE MONITOR W/DEVICE Kit  Use as instructed.          Diet and Activity recommendation: See Discharge Instructions above   Consults obtained - Neuro, Cards,    Major procedures and Radiology Reports - PLEASE review detailed and final reports for all details, in brief -    TTE  - Left ventricle: The cavity size was normal. Systolic function was normal. The estimated ejection fraction was in the range of 55% to 60%. Wall motion was normal; there were no regional wall motion abnormalities.    TEE  Please see echo section for full report. Normal LV size and systolic function, EF 37-16%. No regional wall motion abnormalities. Normal RV size and systolic function. Trivial MR. Trileaflet aortic valve with no AI or AS. Normal atrial sizes. No LAA thrombus. Negative bubble study, no evidence for  PFO or ASD. Normal aorta size with mild plaque descending thoracic aorta.   Carotids  Bilateral: 1-39% ICA stenosis. Vertebral artery flow is antegrade.  Other specific details can be found in the table(s) above. Prepared and Electronically Authenticated by    Leg [place] - no DVT or SVT         Ct Angio Head W/cm &/or Wo Cm  04/19/2014   CLINICAL DATA:  Slurred speech and RIGHT-sided weakness for 2 weeks, worsening speech difficulties today. History of hypertension, diabetes, hyperlipidemia, sarcoma.  EXAM: CT ANGIOGRAPHY HEAD AND NECK  TECHNIQUE: Multidetector CT imaging of the head and neck was performed using the standard protocol during bolus administration of intravenous contrast. Multiplanar CT image reconstructions and MIPs were obtained to evaluate the vascular anatomy. Carotid stenosis measurements (when  applicable) are obtained utilizing NASCET criteria, using the distal internal carotid diameter as the denominator.  CONTRAST:  66m OMNIPAQUE IOHEXOL 350 MG/ML SOLN  COMPARISON:  MRI of the head April 16 2014  FINDINGS: CT HEAD  The ventricles and sulci are normal for age. No intraparenchymal hemorrhage, mass effect nor midline shift. Patchy supratentorial white matter hypodensities are within normal range for patient's age and though non-specific suggest sequelae of chronic small vessel ischemic disease. Wedge-like hypodensity RIGHT mid to superior cerebellum. RIGHT basal ganglia lacunar infarct was present previously, similar appearance of bilateral thalamus small lacunar infarcts. No abnormal intracranial enhancement.  No abnormal extra-axial fluid collections. Basal cisterns are patent. Minimal calcific atherosclerosis of the carotid siphons.  No skull fracture. Persistent metopic suture. The included ocular globes and orbital contents are non-suspicious. The mastoid aircells and included paranasal sinuses are well-aerated. Patient is edentulous.  CTA NECK  Normal appearance of the thoracic arch, normal branch pattern. The origins of the innominate, left Common carotid artery and subclavian artery are widely patent, intimal thickening partially obscured by streak artifact from retained LEFT subclavian venous contrast.  Bilateral Common carotid arteries are widely patent, coursing in a straight line fashion. Calcific atherosclerosis and eccentric intimal hematoma results in approximately 60% narrowing of RIGHT internal carotid artery by NASCET criteria, approximate 13 mm segment of stenosis beginning at the origin. Normal appearance of the mid to distal included internal carotid arteries.  Reflux of contrast limits evaluation the vertebral artery origins. Left vertebral artery is dominant. Suspected mid to high-grade stenosis the origin of LEFT vertebral artery with mild poststenotic dilatation.  No dissection, no  pseudoaneurysm. No abnormal luminal irregularity. No contrast extravasation.  Soft tissues are unremarkable. No acute osseous process though bone windows have not been submitted.  CTA HEAD  Anterior circulation: Patent cervical internal carotid arteries, petrous, cavernous and supra clinoid internal carotid arteries. Intimal thickening results in at least mid grade stenosis anterior genu of the RIGHT carotid siphon. Widely patent anterior communicating artery. Congenitally dominant LEFT A1 segment. Suspected occlusion of RIGHT A1 segment. High-grade stenosis of proximal RIGHT M2 branch. Moderate luminal irregularity of the anterior and middle cerebral arteries.  Posterior circulation: LEFT vertebral artery is dominant. Mid grade stenosis of RIGHT vertebral artery which predominately in terminates in the RIGHT posterior inferior cerebellar artery, with occluded distal thready RIGHT vertebral artery. Mild stenosis LEFT vertebral artery. Widely patent basilar artery. High-grade stenosis suspected of RIGHT superior cerebellar artery origin with moderate luminal irregularity of the RIGHT greater than LEFT superior cerebellar arterys. RIGHT vertebral artery terminates in the posterior inferior cerebellar artery. Patent posterior cerebral arteries with multifocal mid high-grade stenosis of the LEFT P2 segment. High-grade stenosis  of RIGHT P1 segment with thready RIGHT posterior communicating artery present, moderate luminal regularity of the mid to distal RIGHT posterior cerebral artery.  No large vessel occlusion, dissection, luminal irregularity, contrast extravasation or aneurysm within the anterior nor posterior circulation.  IMPRESSION: CT HEAD: Evolving RIGHT superior cerebellar artery territory infarct.  Remote RIGHT basal ganglion and bilateral thalamus lacunar infarcts. Mild white matter changes suggest chronic small vessel ischemic disease.  CTA NECK: Approximately 60% stenosis RIGHT internal carotid artery by  NASCET criteria. No large vessel occlusion.  CTA HEAD: Mid grade stenosis of RIGHT anterior genu of the carotid siphon, suspected occlusion of RIGHT A1 segment with patent A-comm artery. High-grade stenosis of proximal RIGHT M2 branch.  Mid grade stenosis of RIGHT vertebral artery, which predominately at terminates in the RIGHT pack of, and included distal thready RIGHT vertebral artery. Suspected high-grade stenosis of the RIGHT superior cerebellar artery origin. Multifocal high-grade stenosis of LEFT P2 segment. High-grade stenosis of RIGHT P1 segment with RIGHT posterior communicating artery present.  Moderate luminal regularity of the cerebral arteries most consistent with atherosclerosis.   Electronically Signed   By: Elon Alas   On: 04/19/2014 04:03   Ct Head Wo Contrast  04/16/2014   CLINICAL DATA:  Right-sided weakness with slurred speech.  EXAM: CT HEAD WITHOUT CONTRAST  TECHNIQUE: Contiguous axial images were obtained from the base of the skull through the vertex without intravenous contrast.  COMPARISON:  CT 08/07/2010 as well as MRI 08/07/2010.  FINDINGS: Ventricles, cisterns and other CSF spaces are within normal. There is mild chronic ischemic microvascular disease. There are several small old lacune infarct over the base a ganglia bilaterally. There is no oval 1.7 cm hypodensity extending from the region of the right head of caudate nucleus to the lentiform nucleus which may be a subacute to chronic focal infarct. There is no focal mass, mass effect or shift of midline structures. There is no evidence of acute hemorrhage. Remaining bony and soft tissue structures are within normal.  IMPRESSION: 1.7 cm focal hypodensity over the right basal ganglia/internal capsule which may be a subacute to chronic infarct. Several other smaller bilateral old basal ganglia lacunar infarcts.  Chronic ischemic microvascular disease.   Electronically Signed   By: Marin Olp M.D.   On: 04/16/2014 15:37   Ct  Angio Neck W/cm &/or Wo/cm  04/19/2014   CLINICAL DATA:  Slurred speech and RIGHT-sided weakness for 2 weeks, worsening speech difficulties today. History of hypertension, diabetes, hyperlipidemia, sarcoma.  EXAM: CT ANGIOGRAPHY HEAD AND NECK  TECHNIQUE: Multidetector CT imaging of the head and neck was performed using the standard protocol during bolus administration of intravenous contrast. Multiplanar CT image reconstructions and MIPs were obtained to evaluate the vascular anatomy. Carotid stenosis measurements (when applicable) are obtained utilizing NASCET criteria, using the distal internal carotid diameter as the denominator.  CONTRAST:  5m OMNIPAQUE IOHEXOL 350 MG/ML SOLN  COMPARISON:  MRI of the head April 16 2014  FINDINGS: CT HEAD  The ventricles and sulci are normal for age. No intraparenchymal hemorrhage, mass effect nor midline shift. Patchy supratentorial white matter hypodensities are within normal range for patient's age and though non-specific suggest sequelae of chronic small vessel ischemic disease. Wedge-like hypodensity RIGHT mid to superior cerebellum. RIGHT basal ganglia lacunar infarct was present previously, similar appearance of bilateral thalamus small lacunar infarcts. No abnormal intracranial enhancement.  No abnormal extra-axial fluid collections. Basal cisterns are patent. Minimal calcific atherosclerosis of the carotid siphons.  No skull fracture.  Persistent metopic suture. The included ocular globes and orbital contents are non-suspicious. The mastoid aircells and included paranasal sinuses are well-aerated. Patient is edentulous.  CTA NECK  Normal appearance of the thoracic arch, normal branch pattern. The origins of the innominate, left Common carotid artery and subclavian artery are widely patent, intimal thickening partially obscured by streak artifact from retained LEFT subclavian venous contrast.  Bilateral Common carotid arteries are widely patent, coursing in a straight  line fashion. Calcific atherosclerosis and eccentric intimal hematoma results in approximately 60% narrowing of RIGHT internal carotid artery by NASCET criteria, approximate 13 mm segment of stenosis beginning at the origin. Normal appearance of the mid to distal included internal carotid arteries.  Reflux of contrast limits evaluation the vertebral artery origins. Left vertebral artery is dominant. Suspected mid to high-grade stenosis the origin of LEFT vertebral artery with mild poststenotic dilatation.  No dissection, no pseudoaneurysm. No abnormal luminal irregularity. No contrast extravasation.  Soft tissues are unremarkable. No acute osseous process though bone windows have not been submitted.  CTA HEAD  Anterior circulation: Patent cervical internal carotid arteries, petrous, cavernous and supra clinoid internal carotid arteries. Intimal thickening results in at least mid grade stenosis anterior genu of the RIGHT carotid siphon. Widely patent anterior communicating artery. Congenitally dominant LEFT A1 segment. Suspected occlusion of RIGHT A1 segment. High-grade stenosis of proximal RIGHT M2 branch. Moderate luminal irregularity of the anterior and middle cerebral arteries.  Posterior circulation: LEFT vertebral artery is dominant. Mid grade stenosis of RIGHT vertebral artery which predominately in terminates in the RIGHT posterior inferior cerebellar artery, with occluded distal thready RIGHT vertebral artery. Mild stenosis LEFT vertebral artery. Widely patent basilar artery. High-grade stenosis suspected of RIGHT superior cerebellar artery origin with moderate luminal irregularity of the RIGHT greater than LEFT superior cerebellar arterys. RIGHT vertebral artery terminates in the posterior inferior cerebellar artery. Patent posterior cerebral arteries with multifocal mid high-grade stenosis of the LEFT P2 segment. High-grade stenosis of RIGHT P1 segment with thready RIGHT posterior communicating artery  present, moderate luminal regularity of the mid to distal RIGHT posterior cerebral artery.  No large vessel occlusion, dissection, luminal irregularity, contrast extravasation or aneurysm within the anterior nor posterior circulation.  IMPRESSION: CT HEAD: Evolving RIGHT superior cerebellar artery territory infarct.  Remote RIGHT basal ganglion and bilateral thalamus lacunar infarcts. Mild white matter changes suggest chronic small vessel ischemic disease.  CTA NECK: Approximately 60% stenosis RIGHT internal carotid artery by NASCET criteria. No large vessel occlusion.  CTA HEAD: Mid grade stenosis of RIGHT anterior genu of the carotid siphon, suspected occlusion of RIGHT A1 segment with patent A-comm artery. High-grade stenosis of proximal RIGHT M2 branch.  Mid grade stenosis of RIGHT vertebral artery, which predominately at terminates in the RIGHT pack of, and included distal thready RIGHT vertebral artery. Suspected high-grade stenosis of the RIGHT superior cerebellar artery origin. Multifocal high-grade stenosis of LEFT P2 segment. High-grade stenosis of RIGHT P1 segment with RIGHT posterior communicating artery present.  Moderate luminal regularity of the cerebral arteries most consistent with atherosclerosis.   Electronically Signed   By: Elon Alas   On: 04/19/2014 04:03   [name] Contrast  04/16/2014   CLINICAL DATA:  69 year old diabetic male with prior TIA now presenting with slurred speech and right-sided weakness for 2 weeks. History sarcoma. Initial encounter.  EXAM: MRI HEAD WITHOUT CONTRAST  TECHNIQUE: Multiplanar, multiecho pulse sequences of the brain and surrounding structures were obtained without intravenous contrast.  COMPARISON:  04/16/2014 head CT.  08/07/2010 brain MR.  FINDINGS: Exam is motion degraded.  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  Tiny acute nonhemorrhagic posterior right operculum infarct.   Subacute to remote partially hemorrhagic infarct right lenticular nucleus/mid right coronal radiata.  Remote bilateral thalamic infarcts.  Remote infarct anterior left external capsule.  Partially hemorrhagic remote left caudate/anterior limb left duct internal capsule anterior left lenticular nucleus infarct.  Moderate small vessel disease type changes.  Global atrophy without hydrocephalus.  No intracranial mass lesion noted on this unenhanced exam.  Small right vertebral artery may end predominantly in a posterior inferior cerebellar artery distribution. Left vertebral artery, basilar artery and internal carotid arteries appear patent.  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  Tiny acute nonhemorrhagic posterior right operculum infarct.  Subacute to remote partially hemorrhagic infarct right lenticular nucleus/mid right coronal radiata.  Remote bilateral thalamic infarcts.  Remote infarct anterior left external capsule.  Partially hemorrhagic remote left caudate/anterior limb left duct internal capsule anterior left lenticular nucleus infarct.  Moderate small vessel disease type changes.  Small right vertebral artery may end predominantly in a posterior inferior cerebellar artery distribution. Left vertebral artery, basilar artery and internal carotid arteries appear patent.   Electronically Signed   By: Genia Del M.D.   On: 04/16/2014 19:00   US Renal  04/20/2014   CLINICAL DATA:  Acute renal insufficiency.  EXAM: RENAL/URINARY TRACT ULTRASOUND COMPLETE  COMPARISON:  None.  FINDINGS: Right Kidney:  Length: 12.3 cm. Echogenicity within normal limits. No mass or hydronephrosis visualized.  Left Kidney:  Length: 11.8 cm. Echogenicity within normal limits. No mass or hydronephrosis visualized.  Bladder:  Appears normal for degree of bladder distention.   IMPRESSION: Negative exam.   Electronically Signed   By: Inge Rise M.D.   On: 04/20/2014 18:56   Dg Chest Port 1 View  04/20/2014   CLINICAL DATA:  Acute renal injury  EXAM: PORTABLE CHEST - 1 VIEW  COMPARISON:  11/19/2005  FINDINGS: Cardiac shadow is within normal limits. An external monitoring device is noted over the left chest. The overall inspiratory effort is poor with some left basilar atelectasis. No focal confluent infiltrate is seen. No bony abnormality is noted.  IMPRESSION: Left basilar atelectasis.   Electronically Signed   By: Inez Catalina M.D.   On: 04/20/2014 13:20    Micro Results      Recent Results (from the past 240 hour(s))  Culture, blood (routine x 2) Call MD if unable to obtain prior to antibiotics being given     Status: None (Preliminary result)   Collection Time: 04/20/14  1:30 PM  Result Value Ref Range Status   Specimen Description BLOOD LEFT ANTECUBITAL  Final   Special Requests BOTTLES DRAWN AEROBIC ONLY 10CC  Final   Culture   Final           BLOOD CULTURE RECEIVED NO GROWTH TO DATE CULTURE WILL BE HELD FOR 5 DAYS BEFORE ISSUING A FINAL NEGATIVE REPORT Performed at [facility]    Report Status PENDING  Incomplete  Culture, blood (routine x 2) Call MD if unable to obtain prior to antibiotics being given     Status: None (Preliminary result)   Collection Time: 04/20/14  1:40 PM  Result Value Ref Range Status   Specimen Description BLOOD RIGHT HAND  Final   Special Requests BOTTLES  DRAWN AEROBIC ONLY 10CC  Final   Culture   Final           BLOOD CULTURE RECEIVED NO GROWTH TO DATE CULTURE WILL BE HELD FOR 5 DAYS BEFORE ISSUING A FINAL NEGATIVE REPORT Performed at [facility]    Report Status PENDING  Incomplete       Today   Subjective:   Hayden Mckinney today has no headache,no chest abdominal pain,no new weakness tingling or numbness, feels much better.  Objective:   Blood pressure 139/68, pulse 66, temperature 98.2 F  (36.8 C), temperature source Oral, resp. rate 20, height '5\' 10"'  (1.778 m), weight 67.1 kg (147 lb 14.9 oz), SpO2 98 %.   Intake/Output Summary (Last 24 hours) at 04/21/14 1025 Last data filed at 04/20/14 1830  Gross per 24 hour  Intake 5364.17 ml  Output    850 ml  Net 4514.17 ml    Exam Awake Alert, Oriented x 3, No new F.N deficits, Normal affect [place].AT,PERRAL Supple Neck,No JVD, No cervical lymphadenopathy appriciated.  Symmetrical Chest wall movement, Good air movement bilaterally, CTAB RRR,No Gallops,Rubs or new Murmurs, No Parasternal Heave +ve B.Sounds, Abd Soft, Non tender, No organomegaly appriciated, No rebound -guarding or rigidity. No Cyanosis, Clubbing or edema, No new Rash or bruise  Data Review   CBC w Diff:  Lab Results  Component Value Date   WBC 5.7 04/21/2014   HGB 10.6* 04/21/2014   HCT 31.9* 04/21/2014   PLT 212 04/21/2014   LYMPHOPCT 31 04/20/2014   BANDSPCT 0 08/07/2010   MONOPCT 8 04/20/2014   EOSPCT 2 04/20/2014   BASOPCT 1 04/20/2014    CMP:  Lab Results  Component Value Date   NA 143 04/21/2014   K 3.7 04/21/2014   CL 115* 04/21/2014   CO2 22 04/21/2014   BUN 25* 04/21/2014   CREATININE 1.54* 04/21/2014   CREATININE 0.97 12/15/2010   PROT 6.6 08/07/2010   ALBUMIN 3.7 08/07/2010   BILITOT 0.4 08/07/2010   ALKPHOS 50 08/07/2010   AST 10 08/07/2010   ALT 10 08/07/2010  .  Lab Results  Component Value Date   HGBA1C 9.0* 04/18/2014    Lab Results  Component Value Date   CHOL 254* 04/17/2014   HDL 37* 04/17/2014   LDLCALC 162* 04/17/2014   TRIG 276* 04/17/2014   CHOLHDL 6.9 04/17/2014    Total Time in preparing paper work, data evaluation and todays exam - 35 minutes  Thurnell Lose M.D on 04/21/2014 at 10:25 AM  [facility]   Office  [phone]

## 2014-04-21 NOTE — Progress Notes (Signed)
Physical Therapy Treatment Patient Details Name: Hayden Mckinney MRN: [id number] DOB: 10-02-1945 Today's Date: 04/21/2014    History of Present Illness 69 y.o. male hx of HTN, HLD, DM, prior TIA presenting with slurred speech and right sided weakness x 2 weeks. He also notes his right sided weakness has worsened in the past few days. MRI revealed acute infarcts in mid to superior right cerebellum, and small acute infarct in right operculum. Multiple old infarcts also noted    PT Comments    Patient continues to require increased assist secondary to ataxia with gait dysfunction. Worked on coordination of width and stride during ambulation. OF NOTE: patient with significant coughing at begining of session, bout lasted several minutes while sitting EOB, min guard for safety. Patient eventually coughed up white pill remnance into basin, nsg aware and left basin on sink.   Follow Up Recommendations  CIR     Equipment Recommendations  Other (comment) (tbd)    Recommendations for Other Services Rehab consult     Precautions / Restrictions Precautions Precautions: Fall Restrictions Weight Bearing Restrictions: No    Mobility  Bed Mobility Overal bed mobility: Needs Assistance Bed Mobility: Supine to Sit     Supine to sit: Supervision;HOB elevated (use of rail)        Transfers Overall transfer level: Needs assistance Equipment used: Rolling walker (2 wheeled) Transfers: Sit to/from Stand Sit to Stand: Min assist         General transfer comment: min assist to rise and for balance, ataxic quality to movement  Ambulation/Gait Ambulation/Gait assistance: Mod assist Ambulation Distance (Feet): 70 Feet (x2 with standing rest) Assistive device: Rolling walker (2 wheeled) Gait Pattern/deviations: Step-to pattern;Decreased stride length;Ataxic;Decreased dorsiflexion - right;Narrow base of support;Scissoring Gait velocity: decreased Gait velocity interpretation: Below normal  speed for age/gender General Gait Details: Max cues for width of gait and verbal cues for increased flexion of hip/knee RLE during swing phase to increased clearance RLE.    Stairs            Wheelchair Mobility    Modified Rankin (Stroke Patients Only) Modified Rankin (Stroke Patients Only) Pre-Morbid Rankin Score: Moderate disability Modified Rankin: Moderately severe disability     Balance     Sitting balance-Leahy Scale: Fair       Standing balance-Leahy Scale: Poor                      Cognition Arousal/Alertness: Awake/alert Behavior During Therapy: WFL for tasks assessed/performed;Impulsive Overall Cognitive Status: Impaired/Different from baseline Area of Impairment: Safety/judgement         Safety/Judgement: Decreased awareness of deficits;Decreased awareness of safety          Exercises      General Comments General comments (skin integrity, edema, etc.): OF NOTE: patient with significant coughing at begining of session, bout lasted several minutes while sitting EOB, min guard for safety. Patient eventually coughed up white pill remnance into basin, nsg aware and left basin on sink.      Pertinent Vitals/Pain Pain Assessment: No/denies pain    Home Living                      Prior Function            PT Goals (current goals can now be found in the care plan section) Acute Rehab PT Goals Patient Stated Goal: walk PT Goal Formulation: With patient Time For Goal Achievement: 05/01/14 Potential to Achieve  Goals: Good Progress towards PT goals: Progressing toward goals    Frequency  Min 4X/week    PT Plan Current plan remains appropriate    Co-evaluation             End of Session Equipment Utilized During Treatment: Gait belt Activity Tolerance: Patient tolerated treatment well Patient left: in chair;with call bell/phone within reach;with chair alarm set (SLP entering room)     Time: [id number] PT Time  Calculation (min) (ACUTE ONLY): 25 min  Charges:  $Gait Training: 8-22 mins $Therapeutic Activity: 8-22 mins                    G CodesDuncan Dull May 11, 2014, 1:23 PM Alben Deeds, [place] DPT  [phone]

## 2014-04-21 NOTE — H&P (View-Only) (Signed)
Physical Medicine and Rehabilitation Admission H&P    Chief Complaint  Patient presents with  . Aphasia  : HPI: Hayden Mckinney is a 69 y.o. right handed male with history of hypertension, diabetes mellitus peripheral neuropathy, TIA maintained on aspirin 81 mg daily. Presented 04/16/2014 with right-sided weakness and slurred speech 2 weeks. MRI of the brain shows moderate size acute subacute nonhemorrhagic infarct mid to superior right cerebellar with local mass effect as well as tiny acute nonhemorrhagic posterior right operculum infarct, subacute to remote partially hemorrhagic infarct right lenticular nucleus mid right coronal radiata and remote bilateral thalamic and anterior left external capsule infarct. Echocardiogram with ejection fraction of 60% and no regional wall motion abnormalities. Carotid Dopplers with no ICA stenosis. Venous Dopplers lower extremities negative. CTA of the neck showed approximately 60% stenosis right internal carotid artery. CTA of the head with grade stenosis right vertebral artery. Patient did not receive TPA. TEE showed ejection fraction 65% without thrombus no PFO and underwent implantation of loop recorder 04/20/2014 Dr. Rayann Heman. Neurology consulted presently on aspirin/Plavix daily for CVA prophylaxis 3 months then Plavix alone and also placed on subcutaneous Lovenox for DVT prophylaxis. 04/20/2014. On 4/12 developed cough and low-grade fever chest x-ray showed left basilar atelectasis no focal confluent infiltrate and placed on intravenous empiric Unasyn and changed to Augmentin 6 days 04/21/2014. Additionally his creatinine was trending upwards of 1.55. Renal ultrasound negative. Modified barium swallow 04/21/2014 placed on a mechanical soft and nectar liquids. Physical therapy evaluation completed with recommendations of physical medicine rehabilitation consult. Patient was admitted for comprehensive rehabilitation program  ROS Review of Systems  Eyes:  Positive for pain.  Gastrointestinal: Positive for constipation.  Musculoskeletal: Positive for myalgias.  Neurological: Positive for speech change and weakness.  All other systems reviewed and are negative   Past Medical History  Diagnosis Date  . Type II diabetes mellitus   . Sarcoma     a. R leg  . Diabetic peripheral neuropathy     a. both feet  . TIA (transient ischemic attack)     a. 08/2010.  Marland Kitchen Hyperlipidemia   . Hypertension   . Stroke     a. 04/2014 multiple bateral infarcts, presumed to be embolic;  b. 03/7340 Carotid U/S: 1-39% bilat ICA stenosis.  . H/O echocardiogram     a. 04/2014 Echo: EF 55-60%, no rwma.  Marland Kitchen ETOH abuse     a. 04/2014 18-24 beers q weekend.   Past Surgical History  Procedure Laterality Date  . Sarcoma removal    . Repair of r hand after trauma     Family History  Problem Relation Age of Onset  . Cancer Mother   . Cancer Father   . Diabetes Brother    Social History:  reports that he has never smoked. He has never used smokeless tobacco. He reports that he drinks about 3.6 oz of alcohol per week. He reports that he does not use illicit drugs. Allergies: No Known Allergies Medications Prior to Admission  Medication Sig Dispense Refill  . Blood Glucose Monitoring Suppl (RELION CONFIRM GLUCOSE MONITOR) W/DEVICE KIT Use as instructed. 1 kit 0  . lisinopril-hydrochlorothiazide (PRINZIDE) 10-12.5 MG per tablet Take 1 tablet by mouth daily. 60 tablet 6  . metFORMIN (GLUCOPHAGE) 1000 MG tablet Take 1 tablet (1,000 mg total) by mouth 2 (two) times daily with a meal. 120 tablet 6  . pravastatin (PRAVACHOL) 80 MG tablet Take 1 tablet (80 mg total) by mouth every evening.  30 tablet 11  . pregabalin (LYRICA) 50 MG capsule Take 1 capsule (50 mg total) by mouth 2 (two) times daily. (Patient not taking: Reported on 04/17/2014) 60 capsule 6    Home: Home Living Family/patient expects to be discharged to:: Inpatient rehab Living Arrangements:  Non-relatives/Friends Available Help at Discharge: Family, Available 24 hours/day Type of Home: House  Lives With: Other (Comment) (roommate)   Functional History: Prior Function Level of Independence: Independent Comments: ambulated holding furniture, independent with self care, assist to get groceries, could prepare a simple meal, shared housekeeping with roommate  Functional Status:  Mobility: Bed Mobility Overal bed mobility: Needs Assistance Bed Mobility: Supine to Sit Supine to sit: Supervision, HOB elevated (use of rail) General bed mobility comments: HOB elevated, reliance on rail, increased time and assist for trunk positioning Transfers Overall transfer level: Needs assistance Equipment used: Rolling walker (2 wheeled) Transfers: Sit to/from Stand Sit to Stand: Min assist Stand pivot transfers: Min assist General transfer comment: min assist to rise and for balance, ataxic quality to movement Ambulation/Gait Ambulation/Gait assistance: Mod assist, +2 physical assistance Ambulation Distance (Feet): 80 Feet (x2 with seated rest break) Assistive device: Rolling walker (2 wheeled) Gait Pattern/deviations: Step-to pattern, Decreased stride length, Ataxic, Decreased dorsiflexion - right, Narrow base of support, Scissoring Gait velocity: decreased Gait velocity interpretation: Below normal speed for age/gender General Gait Details: patient remains significantly ataxic with mobility, unable to ambulate safely without bilateral +2 assist, patient with poor coordination of drop foot during ambulation. Specific gait training cues for positioning of stride, cued to increase width between LEs (scissoring gait at times). Verbal and tactile cues for posture during gait retraining.     ADL: ADL Overall ADL's : Needs assistance/impaired Eating/Feeding: Set up, Sitting Grooming: Wash/dry hands, Wash/dry face, Brushing hair, Set up, Sitting Upper Body Bathing: Minimal assitance,  Sitting Lower Body Bathing: Moderate assistance, Sit to/from stand Upper Body Dressing : Minimal assistance, Sitting Lower Body Dressing: Moderate assistance, Sit to/from stand Toilet Transfer: Minimal assistance, Buyer, retail Details (indicate cue type and reason): simulated bed to chair Toileting- Clothing Manipulation and Hygiene: Maximal assistance, Sit to/from stand General ADL Comments: Pt able to reach down to socks to pull them up, but not start them over his toes.  Cognition: Cognition Overall Cognitive Status: Impaired/Different from baseline Arousal/Alertness: Awake/alert Orientation Level: Oriented X4 Attention: Divided Divided Attention: Appears intact Memory: Appears intact Awareness: Impaired Awareness Impairment: Emergent impairment Problem Solving: Appears intact Cognition Arousal/Alertness: Awake/alert Behavior During Therapy: WFL for tasks assessed/performed, Impulsive Overall Cognitive Status: Impaired/Different from baseline Area of Impairment: Safety/judgement Safety/Judgement: Decreased awareness of deficits, Decreased awareness of safety General Comments: Patient slightly more impulsive this session, nsg aware  Physical Exam: Blood pressure 144/63, pulse 66, temperature 98.4 F (36.9 C), temperature source Oral, resp. rate 20, height '5\' 10"'  (1.778 m), weight 67.1 kg (147 lb 14.9 oz), SpO2 98 %. Physical Exam  Gen:no acute distress,  HENT:edentulous. Oral mucosa pink and moist Head: Normocephalic.  Eyes: EOM are normal.  Neck: Normal range of motion. Neck supple. No thyromegaly present.  Cardiovascular: Normal rate and regular rhythm.no murmurs or rubs  Respiratory: Effort normal and breath sounds normal. No respiratory distress. no wheezes GI: Soft. Bowel sounds are normal. He exhibits no distension. Non-tender Neurological: He is alert.  Speech is very dysarthric but intelligibility improved today. He is able to provide his name  and age as well as date of birth. Followed simple commands. Right limb ataxia, right PD. RUE motor  4 to 4+/5 delt,bic,tri,wrist, hand.  RLE: 4/5 hf,ke. 0 to trace ADF and 3-4/5 APF. Sensation intact to pain on right side Skin: Skin is warm and dry.  Psych: pt pleasant and approriate   Results for orders placed or performed during the hospital encounter of 04/16/14 (from the past 48 hour(s))  Glucose, capillary     Status: Abnormal   Collection Time: 04/18/14 11:06 AM  Result Value Ref Range   Glucose-Capillary 150 (H) 70 - 99 mg/dL  Glucose, capillary     Status: Abnormal   Collection Time: 04/18/14  4:13 PM  Result Value Ref Range   Glucose-Capillary 217 (H) 70 - 99 mg/dL  Glucose, capillary     Status: Abnormal   Collection Time: 04/18/14  9:58 PM  Result Value Ref Range   Glucose-Capillary 182 (H) 70 - 99 mg/dL   Comment 1 Notify RN    Comment 2 Document in Chart   Basic metabolic panel     Status: Abnormal   Collection Time: 04/19/14  6:58 AM  Result Value Ref Range   Sodium 137 135 - 145 mmol/L   Potassium 3.7 3.5 - 5.1 mmol/L   Chloride 109 96 - 112 mmol/L   CO2 25 19 - 32 mmol/L   Glucose, Bld 167 (H) 70 - 99 mg/dL   BUN 24 (H) 6 - 23 mg/dL   Creatinine, Ser 1.36 (H) 0.50 - 1.35 mg/dL   Calcium 8.4 8.4 - 10.5 mg/dL   GFR calc non Af Amer 52 (L) >90 mL/min   GFR calc Af Amer 60 (L) >90 mL/min    Comment: (NOTE) The eGFR has been calculated using the CKD EPI equation. This calculation has not been validated in all clinical situations. eGFR's persistently <90 mL/min signify possible Chronic Kidney Disease.    Anion gap 3 (L) 5 - 15  Glucose, capillary     Status: Abnormal   Collection Time: 04/19/14  7:10 AM  Result Value Ref Range   Glucose-Capillary 144 (H) 70 - 99 mg/dL   Comment 1 Notify RN    Comment 2 Document in Chart   Glucose, capillary     Status: Abnormal   Collection Time: 04/19/14 11:24 AM  Result Value Ref Range   Glucose-Capillary 147 (H) 70 - 99  mg/dL   Comment 1 Notify RN    Comment 2 Document in Chart   Glucose, capillary     Status: Abnormal   Collection Time: 04/19/14  4:41 PM  Result Value Ref Range   Glucose-Capillary 162 (H) 70 - 99 mg/dL   Comment 1 Notify RN    Comment 2 Document in Chart   Glucose, capillary     Status: Abnormal   Collection Time: 04/19/14  9:50 PM  Result Value Ref Range   Glucose-Capillary 188 (H) 70 - 99 mg/dL   Comment 1 Notify RN    Comment 2 Document in Chart   Glucose, capillary     Status: Abnormal   Collection Time: 04/20/14  6:53 AM  Result Value Ref Range   Glucose-Capillary 167 (H) 70 - 99 mg/dL   Comment 1 Notify RN    Comment 2 Document in Chart    Ct Angio Head W/cm &/or Wo Cm  04/19/2014   CLINICAL DATA:  Slurred speech and RIGHT-sided weakness for 2 weeks, worsening speech difficulties today. History of hypertension, diabetes, hyperlipidemia, sarcoma.  EXAM: CT ANGIOGRAPHY HEAD AND NECK  TECHNIQUE: Multidetector CT imaging of the head and neck was  performed using the standard protocol during bolus administration of intravenous contrast. Multiplanar CT image reconstructions and MIPs were obtained to evaluate the vascular anatomy. Carotid stenosis measurements (when applicable) are obtained utilizing NASCET criteria, using the distal internal carotid diameter as the denominator.  CONTRAST:  45m OMNIPAQUE IOHEXOL 350 MG/ML SOLN  COMPARISON:  MRI of the head April 16 2014  FINDINGS: CT HEAD  The ventricles and sulci are normal for age. No intraparenchymal hemorrhage, mass effect nor midline shift. Patchy supratentorial white matter hypodensities are within normal range for patient's age and though non-specific suggest sequelae of chronic small vessel ischemic disease. Wedge-like hypodensity RIGHT mid to superior cerebellum. RIGHT basal ganglia lacunar infarct was present previously, similar appearance of bilateral thalamus small lacunar infarcts. No abnormal intracranial enhancement.  No  abnormal extra-axial fluid collections. Basal cisterns are patent. Minimal calcific atherosclerosis of the carotid siphons.  No skull fracture. Persistent metopic suture. The included ocular globes and orbital contents are non-suspicious. The mastoid aircells and included paranasal sinuses are well-aerated. Patient is edentulous.  CTA NECK  Normal appearance of the thoracic arch, normal branch pattern. The origins of the innominate, left Common carotid artery and subclavian artery are widely patent, intimal thickening partially obscured by streak artifact from retained LEFT subclavian venous contrast.  Bilateral Common carotid arteries are widely patent, coursing in a straight line fashion. Calcific atherosclerosis and eccentric intimal hematoma results in approximately 60% narrowing of RIGHT internal carotid artery by NASCET criteria, approximate 13 mm segment of stenosis beginning at the origin. Normal appearance of the mid to distal included internal carotid arteries.  Reflux of contrast limits evaluation the vertebral artery origins. Left vertebral artery is dominant. Suspected mid to high-grade stenosis the origin of LEFT vertebral artery with mild poststenotic dilatation.  No dissection, no pseudoaneurysm. No abnormal luminal irregularity. No contrast extravasation.  Soft tissues are unremarkable. No acute osseous process though bone windows have not been submitted.  CTA HEAD  Anterior circulation: Patent cervical internal carotid arteries, petrous, cavernous and supra clinoid internal carotid arteries. Intimal thickening results in at least mid grade stenosis anterior genu of the RIGHT carotid siphon. Widely patent anterior communicating artery. Congenitally dominant LEFT A1 segment. Suspected occlusion of RIGHT A1 segment. High-grade stenosis of proximal RIGHT M2 branch. Moderate luminal irregularity of the anterior and middle cerebral arteries.  Posterior circulation: LEFT vertebral artery is dominant. Mid  grade stenosis of RIGHT vertebral artery which predominately in terminates in the RIGHT posterior inferior cerebellar artery, with occluded distal thready RIGHT vertebral artery. Mild stenosis LEFT vertebral artery. Widely patent basilar artery. High-grade stenosis suspected of RIGHT superior cerebellar artery origin with moderate luminal irregularity of the RIGHT greater than LEFT superior cerebellar arterys. RIGHT vertebral artery terminates in the posterior inferior cerebellar artery. Patent posterior cerebral arteries with multifocal mid high-grade stenosis of the LEFT P2 segment. High-grade stenosis of RIGHT P1 segment with thready RIGHT posterior communicating artery present, moderate luminal regularity of the mid to distal RIGHT posterior cerebral artery.  No large vessel occlusion, dissection, luminal irregularity, contrast extravasation or aneurysm within the anterior nor posterior circulation.  IMPRESSION: CT HEAD: Evolving RIGHT superior cerebellar artery territory infarct.  Remote RIGHT basal ganglion and bilateral thalamus lacunar infarcts. Mild white matter changes suggest chronic small vessel ischemic disease.  CTA NECK: Approximately 60% stenosis RIGHT internal carotid artery by NASCET criteria. No large vessel occlusion.  CTA HEAD: Mid grade stenosis of RIGHT anterior genu of the carotid siphon, suspected occlusion of RIGHT A1 segment  with patent A-comm artery. High-grade stenosis of proximal RIGHT M2 branch.  Mid grade stenosis of RIGHT vertebral artery, which predominately at terminates in the RIGHT pack of, and included distal thready RIGHT vertebral artery. Suspected high-grade stenosis of the RIGHT superior cerebellar artery origin. Multifocal high-grade stenosis of LEFT P2 segment. High-grade stenosis of RIGHT P1 segment with RIGHT posterior communicating artery present.  Moderate luminal regularity of the cerebral arteries most consistent with atherosclerosis.   Electronically Signed   By:  Elon Alas   On: 04/19/2014 04:03   Ct Angio Neck W/cm &/or Wo/cm  04/19/2014   CLINICAL DATA:  Slurred speech and RIGHT-sided weakness for 2 weeks, worsening speech difficulties today. History of hypertension, diabetes, hyperlipidemia, sarcoma.  EXAM: CT ANGIOGRAPHY HEAD AND NECK  TECHNIQUE: Multidetector CT imaging of the head and neck was performed using the standard protocol during bolus administration of intravenous contrast. Multiplanar CT image reconstructions and MIPs were obtained to evaluate the vascular anatomy. Carotid stenosis measurements (when applicable) are obtained utilizing NASCET criteria, using the distal internal carotid diameter as the denominator.  CONTRAST:  68m OMNIPAQUE IOHEXOL 350 MG/ML SOLN  COMPARISON:  MRI of the head April 16 2014  FINDINGS: CT HEAD  The ventricles and sulci are normal for age. No intraparenchymal hemorrhage, mass effect nor midline shift. Patchy supratentorial white matter hypodensities are within normal range for patient's age and though non-specific suggest sequelae of chronic small vessel ischemic disease. Wedge-like hypodensity RIGHT mid to superior cerebellum. RIGHT basal ganglia lacunar infarct was present previously, similar appearance of bilateral thalamus small lacunar infarcts. No abnormal intracranial enhancement.  No abnormal extra-axial fluid collections. Basal cisterns are patent. Minimal calcific atherosclerosis of the carotid siphons.  No skull fracture. Persistent metopic suture. The included ocular globes and orbital contents are non-suspicious. The mastoid aircells and included paranasal sinuses are well-aerated. Patient is edentulous.  CTA NECK  Normal appearance of the thoracic arch, normal branch pattern. The origins of the innominate, left Common carotid artery and subclavian artery are widely patent, intimal thickening partially obscured by streak artifact from retained LEFT subclavian venous contrast.  Bilateral Common carotid  arteries are widely patent, coursing in a straight line fashion. Calcific atherosclerosis and eccentric intimal hematoma results in approximately 60% narrowing of RIGHT internal carotid artery by NASCET criteria, approximate 13 mm segment of stenosis beginning at the origin. Normal appearance of the mid to distal included internal carotid arteries.  Reflux of contrast limits evaluation the vertebral artery origins. Left vertebral artery is dominant. Suspected mid to high-grade stenosis the origin of LEFT vertebral artery with mild poststenotic dilatation.  No dissection, no pseudoaneurysm. No abnormal luminal irregularity. No contrast extravasation.  Soft tissues are unremarkable. No acute osseous process though bone windows have not been submitted.  CTA HEAD  Anterior circulation: Patent cervical internal carotid arteries, petrous, cavernous and supra clinoid internal carotid arteries. Intimal thickening results in at least mid grade stenosis anterior genu of the RIGHT carotid siphon. Widely patent anterior communicating artery. Congenitally dominant LEFT A1 segment. Suspected occlusion of RIGHT A1 segment. High-grade stenosis of proximal RIGHT M2 branch. Moderate luminal irregularity of the anterior and middle cerebral arteries.  Posterior circulation: LEFT vertebral artery is dominant. Mid grade stenosis of RIGHT vertebral artery which predominately in terminates in the RIGHT posterior inferior cerebellar artery, with occluded distal thready RIGHT vertebral artery. Mild stenosis LEFT vertebral artery. Widely patent basilar artery. High-grade stenosis suspected of RIGHT superior cerebellar artery origin with moderate luminal irregularity of the  RIGHT greater than LEFT superior cerebellar arterys. RIGHT vertebral artery terminates in the posterior inferior cerebellar artery. Patent posterior cerebral arteries with multifocal mid high-grade stenosis of the LEFT P2 segment. High-grade stenosis of RIGHT P1 segment  with thready RIGHT posterior communicating artery present, moderate luminal regularity of the mid to distal RIGHT posterior cerebral artery.  No large vessel occlusion, dissection, luminal irregularity, contrast extravasation or aneurysm within the anterior nor posterior circulation.  IMPRESSION: CT HEAD: Evolving RIGHT superior cerebellar artery territory infarct.  Remote RIGHT basal ganglion and bilateral thalamus lacunar infarcts. Mild white matter changes suggest chronic small vessel ischemic disease.  CTA NECK: Approximately 60% stenosis RIGHT internal carotid artery by NASCET criteria. No large vessel occlusion.  CTA HEAD: Mid grade stenosis of RIGHT anterior genu of the carotid siphon, suspected occlusion of RIGHT A1 segment with patent A-comm artery. High-grade stenosis of proximal RIGHT M2 branch.  Mid grade stenosis of RIGHT vertebral artery, which predominately at terminates in the RIGHT pack of, and included distal thready RIGHT vertebral artery. Suspected high-grade stenosis of the RIGHT superior cerebellar artery origin. Multifocal high-grade stenosis of LEFT P2 segment. High-grade stenosis of RIGHT P1 segment with RIGHT posterior communicating artery present.  Moderate luminal regularity of the cerebral arteries most consistent with atherosclerosis.   Electronically Signed   By: Elon Alas   On: 04/19/2014 04:03       Medical Problem List and Plan: 1. 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  DVT Prophylaxis/Anticoagulation: Subcutaneous Lovenox. Monitor platelet counts and any signs of bleeding. Venous Dopplers negative 3. Pain Management: Lyrica 50 mg twice a day Tylenol as needed 4. Hypertension. Coreg 6.25 mg twice a day,BIDIL 20-30 7.5 mg 3 times a day. Monitor with increased mobility  5. Neuropsych: This patient is  capable of making decisions on his  own behalf. 6. Skin/Wound Care: Routine skin checks  7.  Fluids/Electrolytes/Nutrition: Strict I&O's with follow-up chemistries 8. Diabetes mellitus with peripheral neuropathy. Latest hemoglobin A1c 7.9. Presently with sliding scale insulin. Patient on Glucophage 1000 mg twice a day prior to admission. Resume as tolerated Check blood sugars before meals and at bedtime. Diabetic teaching 9. Question left lower lobe pneumonia. Continue antibiotic therapy for now with Augmentin 6 days. Encourage incentive spirometry. Follow-up chest x-ray as needed 10. Acute on chronic renal insufficiency. Latest creatinine 1.54. Renal ultrasound negative. Follow-up chemistries 11. Dysphagia. Diet changed to mechanical soft nectar liquids after modified barium swallow 04/21/2014. Follow-up speech therapy   Post Admission Physician Evaluation: 1. Functional deficits secondary  to right cerebellar infarct as well other scattered subcortical infarcts--?embolic source. 2. Patient is admitted to receive collaborative, interdisciplinary care between the physiatrist, rehab nursing staff, and therapy team. 3. Patient's level of medical complexity and substantial therapy needs in context of that medical necessity cannot be provided at a lesser intensity of care such as a SNF. 4. Patient has experienced substantial functional loss from his/her baseline which was documented above under the "Functional History" and "Functional Status" headings.  Judging by the patient's diagnosis, physical exam, and functional history, the patient has potential for functional progress which will result in measurable gains while on inpatient rehab.  These gains will be of substantial and practical use upon discharge  in facilitating mobility and self-care at the household level. 5. Physiatrist will provide 24 hour management of medical needs as well as oversight of the therapy plan/treatment and provide guidance as appropriate regarding the interaction of the two.  6. 24 hour rehab nursing will assist with  bladder management, bowel management, safety, skin/wound care, disease management, medication administration, pain management and patient education  and help integrate therapy concepts, techniques,education, etc. 7. PT will assess and treat for/with: Lower extremity strength, range of motion, stamina, balance, functional mobility, safety, adaptive techniques and equipment, NMR, vestibular assessment/mgt, pain control, ego support, family ed.   Goals are: supervision to min assist. 8. OT will assess and treat for/with: ADL's, functional mobility, safety, upper extremity strength, adaptive techniques and equipment, NMR, vestibular assessment/mgt,balance, ego support, community reintegration.   Goals are: supervision to min assist. Therapy may proceed with showering this patient. 9. SLP will assess and treat for/with: speech, swallowing, communication.  Goals are: supervision to mod I. 10. Case Management and Social Worker will assess and treat for psychological issues and discharge planning. 11. Team conference will be held weekly to assess progress toward goals and to determine barriers to discharge. 12. Patient will receive at least 3 hours of therapy per day at least 5 days per week. 13. ELOS: 12-16 days       14. Prognosis:  excellent     Meredith Staggers, MD, [facility] 04/21/2014   04/20/2014

## 2014-04-21 NOTE — Progress Notes (Addendum)
SLP Cancellation Note  Patient Details Name: Hayden Mckinney MRN: [id number] DOB: May 28, 1945   Cancelled treatment:       Reason Eval/Treat Not Completed: Medical issues which prohibited therapy - pt NPO pending renal US. Upon chart review, note that there is new concern for aspiration PNA in LLL. Will f/u as able to re-assess aspiration risk and possible need for objective testing. RN to page this SLP upon completion of [place] today - should pt d/c to CIR, objective testing can also be pursued at that level of care as well.  ADDENDUM: RN paged SLP to report that pt no longer needs to be NPO this morning. During medication administration, pt found to have significant coughing and per PT expectorated pill. Given observed difficulties from staff as well as concern for new PNA, will place order for MBS to be completed today, schedule with radiology at 12:00.   [name], M.A. CCC-SLP [phone]  [name] 04/21/2014, 9:00 AM

## 2014-04-21 NOTE — Progress Notes (Signed)
OT Cancellation Note  Patient Details Name: Hayden Mckinney MRN: [id number] DOB: 1945-04-11   Cancelled Treatment:    Reason Eval/Treat Not Completed: Patient at procedure or test/ unavailable;Other (comment) (swallow study). OT to reattempt as schedule permits.  Hortencia Pilar 04/21/2014, 11:41 AM

## 2014-04-21 NOTE — Progress Notes (Signed)
Retta Diones, RN Rehab Admission Coordinator Signed Physical Medicine and Rehabilitation PMR Pre-admission 04/20/2014 9:29 AM  Related encounter: ED to Hosp-Admission (Discharged) from 04/16/2014 in [facility] Collapse All   PMR Admission Coordinator Pre-Admission Assessment  Patient: Hayden Mckinney is an 69 y.o., male MRN: [id number] DOB: Jul 13, 1945 Height: 5\' 10"  (177.8 cm) Weight: 67.1 kg (147 lb 14.9 oz)  Insurance Information HMO: No PPO: PCP: IPA: 80/20: OTHER:  PRIMARY: Medicare A/B Policy#: [id number] Subscriber: Dudley Major CM Name: Phone#: Fax#:  Pre-Cert#: Employer: Retired Benefits: Phone #: Name: Checked in [place]. 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 right handed male with history of hypertension, diabetes mellitus peripheral neuropathy, TIA maintained on aspirin 81 mg daily. Presented 04/16/2014 with right-sided weakness and slurred speech 2 weeks. MRI of the brain shows moderate size acute subacute nonhemorrhagic infarct mid to superior right cerebellar with local mass effect as well as tiny acute nonhemorrhagic posterior right operculum infarct, subacute to remote partially hemorrhagic infarct right lenticular  nucleus mid right coronal radiata and remote bilateral thalamic and anterior left external capsule infarct. Echocardiogram with ejection fraction of 60% and no regional wall motion abnormalities. Carotid Dopplers with no ICA stenosis. Venous Dopplers lower extremities negative. CTA of the neck showed approximately 60% stenosis right internal carotid artery. CTA of the head with grade stenosis right vertebral artery. Patient did not receive TPA. TEE showed ejection fraction 65% without thrombus no PFO and underwent implantation of loop recorder 04/20/2014. Neurology consulted presently on aspirin/Plavix daily for CVA prophylaxis 3 months then Plavix alone and also placed on subcutaneous Lovenox for DVT prophylaxis. 04/20/2014. On 4/12 developed cough and low-grade fever chest x-ray showed left basilar atelectasis no focal confluent infiltrate and placed on intravenous empiric Unasyn and changed to Augmentin 6 days 04/21/2014. Additionally his creatinine was trending upwards of 1.55. Renal ultrasound negative. Physical therapy evaluation completed with recommendations of physical medicine rehabilitation consult. Patient to be admitted for comprehensiveinpatient rehabilitation program.  Total: 3=NIH  Past Medical History  Past Medical History  Diagnosis Date  . Type II diabetes mellitus   . Sarcoma     a. R leg  . Diabetic peripheral neuropathy     a. both feet  . TIA (transient ischemic attack)     a. 08/2010.  Marland Kitchen Hyperlipidemia   . Hypertension   . Stroke     a. 04/2014 multiple bateral infarcts, presumed to be embolic; b. [id number] Carotid U/S: 1-39% bilat ICA stenosis.  . H/O echocardiogram     a. 04/2014 Echo: EF 55-60%, no rwma.  Marland Kitchen ETOH abuse     a. 04/2014 18-24 beers q weekend.    Family History  family history includes Cancer in his father and mother; Diabetes in his brother.  Prior Rehab/Hospitalizations: No previous rehab  admissions.  Current Medications   Current facility-administered medications:  . 0.9 % sodium chloride infusion, , Intravenous, Continuous, Rogelia Mire, NP, Last Rate: 20 mL/hr at 04/21/14 1001 . acetaminophen (TYLENOL) tablet 650  mg, 650 mg, Oral, Q6H PRN, Dianne Dun, NP, 650 mg at 04/17/14 2026 . Ampicillin-Sulbactam (UNASYN) 3 g in sodium chloride 0.9 % 100 mL IVPB, 3 g, Intravenous, Q8H, Eudelia Bunch, RPH, 3 g at 04/21/14 0736 . aspirin EC tablet 81 mg, 81 mg, Oral, Daily, Donzetta Starch, NP, 81 mg at 04/21/14 0949 . atorvastatin (LIPITOR) tablet 40 mg, 40 mg, Oral, q1800, Janece Canterbury, MD, 40 mg at 04/20/14 1714 . carvedilol (COREG) tablet 6.25 mg, 6.25 mg, Oral, BID WC, Janece Canterbury, MD, 6.25 mg at 04/20/14 1714 . clopidogrel (PLAVIX) tablet 75 mg, 75 mg, Oral, Daily, Donzetta Starch, NP, 75 mg at 04/21/14 0949 . enoxaparin (LOVENOX) injection 40 mg, 40 mg, Subcutaneous, Q24H, Merton Border, MD, 40 mg at 04/20/14 2213 . hydrALAZINE (APRESOLINE) injection 10 mg, 10 mg, Intravenous, Q4H PRN, Janece Canterbury, MD, 10 mg at 04/17/14 1847 . insulin aspart (novoLOG) injection 0-5 Units, 0-5 Units, Subcutaneous, QHS, Merton Border, MD, 2 Units at 04/17/14 2215 . insulin aspart (novoLOG) injection 0-9 Units, 0-9 Units, Subcutaneous, TID WC, Merton Border, MD, 5 Units at 04/20/14 1714 . isosorbide-hydrALAZINE (BIDIL) 20-37.5 MG per tablet 1 tablet, 1 tablet, Oral, TID, Janece Canterbury, MD, 1 tablet at 04/21/14 0949 . senna-docusate (Senokot-S) tablet 1 tablet, 1 tablet, Oral, QHS PRN, Merton Border, MD . sodium chloride 0.9 % bolus 1,000 mL, 1,000 mL, Intravenous, Once, Thurnell Lose, MD, 1,000 mL at 04/21/14 1015  Patients Current Diet: DIET DYS 3 Room service appropriate?: Yes; Fluid consistency:: Thin  Precautions / Restrictions Precautions Precautions: Fall Restrictions Weight Bearing Restrictions: No   Prior Activity Level Community (5-7x/wk): Went  to bars with his roommate. Do longer driving. Had DUIs in the past.   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[place] Devices/Equipment: Cane (specify quad or straight), CBG Meter, Eyeglasses, Hand-held shower hose  Prior Functional Level Prior Function Level of Independence: Independent Comments: ambulated holding furniture, independent with self care, assist to get groceries, could prepare a simple meal, shared housekeeping with roommate  Current Functional Level Cognition  Arousal/Alertness: Awake/alert Overall Cognitive Status: Impaired/Different from baseline Orientation Level: Oriented X4 Safety/Judgement: Decreased awareness of deficits, Decreased awareness of safety General Comments: Patient slightly more impulsive this session, nsg aware Attention: Divided Divided Attention: Appears intact Memory: Appears intact Awareness: Impaired Awareness Impairment: Emergent impairment Problem Solving: Appears intact   Extremity Assessment (includes Sensation/Coordination)  Upper Extremity Assessment: RUE deficits/detail, LUE deficits/detail RUE Deficits / Details: 4/5 except gross grip 3/5, long standing joint deformity first finger DIP LUE Deficits / Details: 4/5 strength, slower movement compared to R  Lower Extremity Assessment: Defer to PT evaluation RLE Deficits / Details: weakness noted, distal motions weaker then proximal. 3+/5, (1/5 dorsiflexion) RLE Sensation: decreased light touch, decreased proprioception RLE Coordination: decreased fine motor, decreased gross motor    ADLs  Overall ADL's : Needs assistance/impaired Eating/Feeding: Set up, Sitting Grooming: Wash/dry hands, Wash/dry face, Brushing hair, Set up, Sitting Upper Body Bathing: Minimal assitance, Sitting Lower Body Bathing: Moderate assistance, Sit to/from stand Upper Body Dressing : Minimal assistance, Sitting Lower Body Dressing: Moderate assistance, Sit to/from stand Toilet Transfer: Minimal  assistance, Buyer, retail Details (indicate cue type and reason): simulated bed to chair Toileting- Clothing Manipulation and Hygiene: Maximal assistance, Sit to/from stand General ADL Comments: Pt able to reach down to socks to pull them up, but not start them over his toes.    Mobility  Overal bed mobility: Needs Assistance Bed Mobility: Supine to Sit Supine  to sit: Supervision, HOB elevated (use of rail) General bed mobility comments: HOB elevated, reliance on rail, increased time and assist for trunk positioning    Transfers  Overall transfer level: Needs assistance Equipment used: Rolling walker (2 wheeled) Transfers: Sit to/from Stand Sit to Stand: Min assist Stand pivot transfers: Min assist General transfer comment: min assist to rise and for balance, ataxic quality to movement    Ambulation / Gait / Stairs / Wheelchair Mobility  Ambulation/Gait Ambulation/Gait assistance: Mod assist, +2 physical assistance Ambulation Distance (Feet): 80 Feet (x2 with seated rest break) Assistive device: Rolling walker (2 wheeled) Gait Pattern/deviations: Step-to pattern, Decreased stride length, Ataxic, Decreased dorsiflexion - right, Narrow base of support, Scissoring Gait velocity: decreased Gait velocity interpretation: Below normal speed for age/gender General Gait Details: patient remains significantly ataxic with mobility, unable to ambulate safely without bilateral +2 assist, patient with poor coordination of drop foot during ambulation. Specific gait training cues for positioning of stride, cued to increase width between LEs (scissoring gait at times). Verbal and tactile cues for posture during gait retraining.     Posture / Balance Balance Overall balance assessment: Needs assistance Sitting balance-Leahy Scale: Fair Standing balance-Leahy Scale: Poor Standing balance comment: unable to stand without holding on to therapist or rail    Special needs/care  consideration BiPAP/CPAP No CPM No Continuous Drip IV KVO Dialysis No  Life Vest No Oxygen No Special Bed No Trach Size No Wound Vac (area) No  Skin No  Bowel mgmt: Last BM 04/18/14 Bladder mgmt: Incontinent of urine Diabetic mgmt Yes    Previous Home Environment Living Arrangements: Non-relatives/Friends Lives With: Other (Comment) (roommate) Available Help at Discharge: Family, Available 24 hours/day Type of Home: Mobile home Home Layout: One level Home Access: Stairs to enter [employer] of Steps: 3-4 steps [place]: No  Discharge Living Setting Plans for Discharge Living Setting: House, Lives with (comment) (Plans to go home with his half sister, Vivien Rota.) Type of Home at Discharge: House Discharge Home Layout: One level Discharge Home Access: Stairs to enter Entrance Stairs-Number of Steps: 2-3 steps Does the patient have any problems obtaining your medications?: Yes (Describe)  Social/Family/Support Systems Patient Roles: Other (Comment) (Has a roommate and half sisters.) Contact Information: Serita Sheller - half sister Anticipated Caregiver: Vivien Rota Anticipated Caregiver's Contact Information: Vivien Rota [phone] Ability/Limitations of Caregiver: Half sister can provide supervision and patient can stay with her while recovering. Caregiver Availability: 24/7 Discharge Plan Discussed with Primary Caregiver: Yes Is Caregiver In Agreement with Plan?: Yes Does Caregiver/Family have Issues with Lodging/Transportation while Pt is in Rehab?: No  Goals/Additional Needs Patient/Family Goal for Rehab: PT mod I, OT mod I, ST mod I and supervision goals Expected length of stay: 10-14 days Cultural Considerations: Christian Dietary Needs: Dys 3, thin liquids Equipment Needs: TBD Pt/Family Agrees to Admission and willing to participate: Yes Program Orientation Provided & Reviewed with Pt/Caregiver Including Roles &  Responsibilities: Yes  Decrease burden of Care through IP rehab admission: N/A  Possible need for SNF placement upon discharge: Not anticipated  Patient Condition: This patient's medical and functional status has changed since the consult dated: 04/19/14 in which the Rehabilitation Physician determined and documented that the patient's condition is appropriate for intensive rehabilitative care in an inpatient rehabilitation facility. See "History of Present Illness" (above) for medical update. Functional changes are: Currently requiring mod assist +2 to ambulate 80 ft RW with seated rest break X 2. Patient's medical and functional status update has been discussed  with the Rehabilitation physician and patient remains appropriate for inpatient rehabilitation. Will admit to inpatient rehab today.  Preadmission Screen Completed By: Retta Diones, 04/21/2014 10:57 AM ______________________________________________________________________  Discussed status with Dr. Naaman Plummer on 04/21/14 at 1057 and received telephone approval for admission today.  Admission Coordinator: Retta Diones, time1057/Date04/13/16          Cosigned by: Meredith Staggers, MD at 04/21/2014 11:08 AM  Revision History     Date/Time User Provider Type Action   04/21/2014 11:08 AM Meredith Staggers, MD Physician Cosign   04/21/2014 10:57 AM Retta Diones, RN Rehab Admission Coordinator Sign

## 2014-04-21 NOTE — Progress Notes (Signed)
Meredith Staggers, MD Physician Signed Physical Medicine and Rehabilitation Consult Note 04/19/2014 2:17 PM  Related encounter: ED to Hosp-Admission (Discharged) from 04/16/2014 in [place] Collapse All     Physical Medicine and Rehabilitation Consult Reason for Consult: CVA Referring Physician: [facility]   HPI: Hayden Mckinney is a 69 y.o. right handed male with history of hypertension, diabetes mellitus peripheral neuropathy, TIA maintained on aspirin 81 mg daily. Presented 04/16/2014 with right-sided weakness and slurred speech 2 weeks. MRI of the brain shows moderate size acute subacute nonhemorrhagic infarct mid to superior right cerebellar with local mass effect as well as tiny acute nonhemorrhagic posterior right operculum infarct, subacute to remote partially hemorrhagic infarct right lenticular nucleus mid right coronal radiata and remote bilateral thalamic and anterior left external capsule infarct. Echocardiogram with ejection fraction of 60% and no regional wall motion abnormalities. Carotid Dopplers with no ICA stenosis. CTA of the neck showed approximately 60% stenosis right internal carotid artery. CTA of the head with grade stenosis right vertebral artery. Patient did not receive TPA. Neurology consulted presently on aspirin 325 mg daily for CVA prophylaxis as well as subcutaneous Lovenox for DVT prophylaxis. Await planned TEE and possible loop recorder. Physical therapy evaluation completed with recommendations of physical medicine rehabilitation consult.   Review of Systems  Eyes: Positive for pain.  Gastrointestinal: Positive for constipation.  Musculoskeletal: Positive for myalgias.  Neurological: Positive for speech change and weakness.  All other systems reviewed and are negative.  Past Medical History  Diagnosis Date  . Diabetes mellitus   . Cancer sarcoma of rt leg  . Sarcoma   .  Numbness in feet     both feet  . Stroke 08/2010    TIA  . Numbness and tingling in left hand     due to stroke  . Hyperlipidemia   . Hypertension    Past Surgical History  Procedure Laterality Date  . Sarcoma removal    . Repair of r hand after trauma     Family History  Problem Relation Age of Onset  . Cancer Mother   . Cancer Father   . Diabetes Brother    Social History:  reports that he has never smoked. He has never used smokeless tobacco. He reports that he drinks about 3.0 oz of alcohol per week. He reports that he does not use illicit drugs.   Allergies: No Known Allergies   Medications Prior to Admission  Medication Sig Dispense Refill  . Blood Glucose Monitoring Suppl (RELION CONFIRM GLUCOSE MONITOR) W/DEVICE KIT Use as instructed. 1 kit 0  . lisinopril-hydrochlorothiazide (PRINZIDE) 10-12.5 MG per tablet Take 1 tablet by mouth daily. 60 tablet 6  . metFORMIN (GLUCOPHAGE) 1000 MG tablet Take 1 tablet (1,000 mg total) by mouth 2 (two) times daily with a meal. 120 tablet 6  . pravastatin (PRAVACHOL) 80 MG tablet Take 1 tablet (80 mg total) by mouth every evening. 30 tablet 11  . pregabalin (LYRICA) 50 MG capsule Take 1 capsule (50 mg total) by mouth 2 (two) times daily. (Patient not taking: Reported on 04/17/2014) 60 capsule 6    Home: Home Living Family/patient expects to be discharged to:: Inpatient rehab Living Arrangements: Non-relatives/Friends Type of Home: House Lives With: Other (Comment) (roommate) Functional History: Prior Function Level of Independence: Independent Comments: ambulated holding furniture, independent with self care, assist to get groceries, could prepare a simple meal, shared housekeeping with roommate Functional Status:  Mobility: Bed Mobility Overal bed mobility: Needs  Assistance Bed Mobility: Supine to Sit Supine to sit: Supervision, HOB elevated (use of rail) General bed mobility comments: HOB elevated, reliance on rail, increased time and assist for trunk positioning Transfers Overall transfer level: Needs assistance Equipment used: 1 person hand held assist Transfers: Sit to/from Stand, Stand Pivot Transfers Sit to Stand: Min assist Stand pivot transfers: Min assist General transfer comment: min assist to rise and for balance, ataxic quality to movement Ambulation/Gait Ambulation/Gait assistance: Mod assist, +2 physical assistance Ambulation Distance (Feet): 50 Feet Assistive device: Rolling walker (2 wheeled) Gait Pattern/deviations: Step-to pattern, Decreased stride length, Ataxic, Decreased dorsiflexion - right, Narrow base of support, Scissoring Gait velocity: decreased Gait velocity interpretation: Below normal speed for age/gender General Gait Details: Patient very ataxic with gait, increased instability and multiple LOB requiring physical assist (+2) as well as needing assist to maintain RW on the ground secondary to coordination deficits    ADL: ADL Overall ADL's : Needs assistance/impaired Eating/Feeding: Set up, Sitting Grooming: Wash/dry hands, Wash/dry face, Brushing hair, Set up, Sitting Upper Body Bathing: Minimal assitance, Sitting Lower Body Bathing: Moderate assistance, Sit to/from stand Upper Body Dressing : Minimal assistance, Sitting Lower Body Dressing: Moderate assistance, Sit to/from stand Toilet Transfer: Minimal assistance, Buyer, retail Details (indicate cue type and reason): simulated bed to chair Toileting- Clothing Manipulation and Hygiene: Maximal assistance, Sit to/from stand General ADL Comments: Pt able to reach down to socks to pull them up, but not start them over his toes.  Cognition: Cognition Overall Cognitive Status: Impaired/Different from baseline Orientation Level: Oriented  X4 Cognition Arousal/Alertness: Awake/alert Behavior During Therapy: WFL for tasks assessed/performed Overall Cognitive Status: Impaired/Different from baseline Area of Impairment: Safety/judgement Safety/Judgement: Decreased awareness of deficits, Decreased awareness of safety  Blood pressure 138/60, pulse 64, temperature 97.6 F (36.4 C), temperature source Oral, resp. rate 18, height _0  (1.778 m), weight 67.1 kg (147 lb 14.9 oz), SpO2 99 %. Physical Exam  HENT:edentulous Head: Normocephalic.  Eyes: EOM are normal.  Neck: Normal range of motion. Neck supple. No thyromegaly present.  Cardiovascular: Normal rate and regular rhythm.  Respiratory: Effort normal and breath sounds normal. No respiratory distress.  GI: Soft. Bowel sounds are normal. He exhibits no distension.  Neurological: He is alert.  Speech is very dysarthric. He is able to provide his name and age as well as date of birth. Followed simple commands. Right limb ataxia, right PD. RUE motor 4 to 4+/5. RLE: 4/5 hf,ke. 0 to trace ADF and 3-4/5 APF. Sensation intact to pain on right side Skin: Skin is warm and dry.  Psych: pt pleasant and approriate    Lab Results Last 24 Hours    Results for orders placed or performed during the hospital encounter of 04/16/14 (from the past 24 hour(s))  Basic metabolic panel Status: Abnormal   Collection Time: 04/18/14 6:19 AM  Result Value Ref Range   Sodium 140 135 - 145 mmol/L   Potassium 3.5 3.5 - 5.1 mmol/L   Chloride 107 96 - 112 mmol/L   CO2 26 19 - 32 mmol/L   Glucose, Bld 150 (H) 70 - 99 mg/dL   BUN 21 6 - 23 mg/dL   Creatinine, Ser 1.19 0.50 - 1.35 mg/dL   Calcium 8.8 8.4 - 10.5 mg/dL   GFR calc non Af Amer 61 (L) >90 mL/min   GFR calc Af Amer 70 (L) >90 mL/min   Anion gap 7 5 - 15  Glucose,  capillary Status: Abnormal   Collection Time:  04/18/14 6:47 AM  Result Value Ref Range   Glucose-Capillary 163 (H) 70 - 99 mg/dL   Comment 1 Notify RN    Comment 2 Document in Chart   Glucose, capillary Status: Abnormal   Collection Time: 04/18/14 11:06 AM  Result Value Ref Range   Glucose-Capillary 150 (H) 70 - 99 mg/dL  Glucose, capillary Status: Abnormal   Collection Time: 04/18/14 4:13 PM  Result Value Ref Range   Glucose-Capillary 217 (H) 70 - 99 mg/dL  Glucose, capillary Status: Abnormal   Collection Time: 04/18/14 9:58 PM  Result Value Ref Range   Glucose-Capillary 182 (H) 70 - 99 mg/dL   Comment 1 Notify RN    Comment 2 Document in Chart       Imaging Results (Last 48 hours)    Ct Angio Head W/cm &/or Wo Cm  04/19/2014 CLINICAL DATA: Slurred speech and RIGHT-sided weakness for 2 weeks, worsening speech difficulties today. History of hypertension, diabetes, hyperlipidemia, sarcoma. EXAM: CT ANGIOGRAPHY HEAD AND NECK TECHNIQUE: Multidetector CT imaging of the head and neck was performed using the standard protocol during bolus administration of intravenous contrast. Multiplanar CT image reconstructions and MIPs were obtained to evaluate the vascular anatomy. Carotid stenosis measurements (when applicable) are obtained utilizing NASCET criteria, using the distal internal carotid diameter as the denominator. CONTRAST: 19m OMNIPAQUE IOHEXOL 350 MG/ML SOLN COMPARISON: MRI of the head April 16 2014 FINDINGS: CT HEAD The ventricles and sulci are normal for age. No intraparenchymal hemorrhage, mass effect nor midline shift. Patchy supratentorial white matter hypodensities are within normal range for patient's age and though non-specific suggest sequelae of chronic small vessel ischemic disease. Wedge-like hypodensity RIGHT mid to superior cerebellum. RIGHT basal ganglia lacunar infarct was present  previously, similar appearance of bilateral thalamus small lacunar infarcts. No abnormal intracranial enhancement. No abnormal extra-axial fluid collections. Basal cisterns are patent. Minimal calcific atherosclerosis of the carotid siphons. No skull fracture. Persistent metopic suture. The included ocular globes and orbital contents are non-suspicious. The mastoid aircells and included paranasal sinuses are well-aerated. Patient is edentulous. CTA NECK Normal appearance of the thoracic arch, normal branch pattern. The origins of the innominate, left Common carotid artery and subclavian artery are widely patent, intimal thickening partially obscured by streak artifact from retained LEFT subclavian venous contrast. Bilateral Common carotid arteries are widely patent, coursing in a straight line fashion. Calcific atherosclerosis and eccentric intimal hematoma results in approximately 60% narrowing of RIGHT internal carotid artery by NASCET criteria, approximate 13 mm segment of stenosis beginning at the origin. Normal appearance of the mid to distal included internal carotid arteries. Reflux of contrast limits evaluation the vertebral artery origins. Left vertebral artery is dominant. Suspected mid to high-grade stenosis the origin of LEFT vertebral artery with mild poststenotic dilatation. No dissection, no pseudoaneurysm. No abnormal luminal irregularity. No contrast extravasation. Soft tissues are unremarkable. No acute osseous process though bone windows have not been submitted. CTA HEAD Anterior circulation: Patent cervical internal carotid arteries, petrous, cavernous and supra clinoid internal carotid arteries. Intimal thickening results in at least mid grade stenosis anterior genu of the RIGHT carotid siphon. Widely patent anterior communicating artery. Congenitally dominant LEFT A1 segment. Suspected occlusion of RIGHT A1 segment. High-grade stenosis of proximal RIGHT M2 branch. Moderate luminal  irregularity of the anterior and middle cerebral arteries. Posterior circulation: LEFT vertebral artery is dominant. Mid grade stenosis of RIGHT vertebral artery which predominately in terminates in the RIGHT posterior inferior cerebellar  artery, with occluded distal thready RIGHT vertebral artery. Mild stenosis LEFT vertebral artery. Widely patent basilar artery. High-grade stenosis suspected of RIGHT superior cerebellar artery origin with moderate luminal irregularity of the RIGHT greater than LEFT superior cerebellar arterys. RIGHT vertebral artery terminates in the posterior inferior cerebellar artery. Patent posterior cerebral arteries with multifocal mid high-grade stenosis of the LEFT P2 segment. High-grade stenosis of RIGHT P1 segment with thready RIGHT posterior communicating artery present, moderate luminal regularity of the mid to distal RIGHT posterior cerebral artery. No large vessel occlusion, dissection, luminal irregularity, contrast extravasation or aneurysm within the anterior nor posterior circulation. IMPRESSION: CT HEAD: Evolving RIGHT superior cerebellar artery territory infarct. Remote RIGHT basal ganglion and bilateral thalamus lacunar infarcts. Mild white matter changes suggest chronic small vessel ischemic disease. CTA NECK: Approximately 60% stenosis RIGHT internal carotid artery by NASCET criteria. No large vessel occlusion. CTA HEAD: Mid grade stenosis of RIGHT anterior genu of the carotid siphon, suspected occlusion of RIGHT A1 segment with patent A-comm artery. High-grade stenosis of proximal RIGHT M2 branch. Mid grade stenosis of RIGHT vertebral artery, which predominately at terminates in the RIGHT pack of, and included distal thready RIGHT vertebral artery. Suspected high-grade stenosis of the RIGHT superior cerebellar artery origin. Multifocal high-grade stenosis of LEFT P2 segment. High-grade stenosis of RIGHT P1 segment with RIGHT posterior communicating artery present.  Moderate luminal regularity of the cerebral arteries most consistent with atherosclerosis. Electronically Signed By: Elon Alas On: 04/19/2014 04:03   Ct Angio Neck W/cm &/or Wo/cm  04/19/2014 CLINICAL DATA: Slurred speech and RIGHT-sided weakness for 2 weeks, worsening speech difficulties today. History of hypertension, diabetes, hyperlipidemia, sarcoma. EXAM: CT ANGIOGRAPHY HEAD AND NECK TECHNIQUE: Multidetector CT imaging of the head and neck was performed using the standard protocol during bolus administration of intravenous contrast. Multiplanar CT image reconstructions and MIPs were obtained to evaluate the vascular anatomy. Carotid stenosis measurements (when applicable) are obtained utilizing NASCET criteria, using the distal internal carotid diameter as the denominator. CONTRAST: 40m OMNIPAQUE IOHEXOL 350 MG/ML SOLN COMPARISON: MRI of the head April 16 2014 FINDINGS: CT HEAD The ventricles and sulci are normal for age. No intraparenchymal hemorrhage, mass effect nor midline shift. Patchy supratentorial white matter hypodensities are within normal range for patient's age and though non-specific suggest sequelae of chronic small vessel ischemic disease. Wedge-like hypodensity RIGHT mid to superior cerebellum. RIGHT basal ganglia lacunar infarct was present previously, similar appearance of bilateral thalamus small lacunar infarcts. No abnormal intracranial enhancement. No abnormal extra-axial fluid collections. Basal cisterns are patent. Minimal calcific atherosclerosis of the carotid siphons. No skull fracture. Persistent metopic suture. The included ocular globes and orbital contents are non-suspicious. The mastoid aircells and included paranasal sinuses are well-aerated. Patient is edentulous. CTA NECK Normal appearance of the thoracic arch, normal branch pattern. The origins of the innominate, left Common carotid artery and subclavian artery are widely patent, intimal  thickening partially obscured by streak artifact from retained LEFT subclavian venous contrast. Bilateral Common carotid arteries are widely patent, coursing in a straight line fashion. Calcific atherosclerosis and eccentric intimal hematoma results in approximately 60% narrowing of RIGHT internal carotid artery by NASCET criteria, approximate 13 mm segment of stenosis beginning at the origin. Normal appearance of the mid to distal included internal carotid arteries. Reflux of contrast limits evaluation the vertebral artery origins. Left vertebral artery is dominant. Suspected mid to high-grade stenosis the origin of LEFT vertebral artery with mild poststenotic dilatation. No dissection, no pseudoaneurysm. No abnormal luminal irregularity. No contrast  extravasation. Soft tissues are unremarkable. No acute osseous process though bone windows have not been submitted. CTA HEAD Anterior circulation: Patent cervical internal carotid arteries, petrous, cavernous and supra clinoid internal carotid arteries. Intimal thickening results in at least mid grade stenosis anterior genu of the RIGHT carotid siphon. Widely patent anterior communicating artery. Congenitally dominant LEFT A1 segment. Suspected occlusion of RIGHT A1 segment. High-grade stenosis of proximal RIGHT M2 branch. Moderate luminal irregularity of the anterior and middle cerebral arteries. Posterior circulation: LEFT vertebral artery is dominant. Mid grade stenosis of RIGHT vertebral artery which predominately in terminates in the RIGHT posterior inferior cerebellar artery, with occluded distal thready RIGHT vertebral artery. Mild stenosis LEFT vertebral artery. Widely patent basilar artery. High-grade stenosis suspected of RIGHT superior cerebellar artery origin with moderate luminal irregularity of the RIGHT greater than LEFT superior cerebellar arterys. RIGHT vertebral artery terminates in the posterior inferior cerebellar artery. Patent posterior  cerebral arteries with multifocal mid high-grade stenosis of the LEFT P2 segment. High-grade stenosis of RIGHT P1 segment with thready RIGHT posterior communicating artery present, moderate luminal regularity of the mid to distal RIGHT posterior cerebral artery. No large vessel occlusion, dissection, luminal irregularity, contrast extravasation or aneurysm within the anterior nor posterior circulation. IMPRESSION: CT HEAD: Evolving RIGHT superior cerebellar artery territory infarct. Remote RIGHT basal ganglion and bilateral thalamus lacunar infarcts. Mild white matter changes suggest chronic small vessel ischemic disease. CTA NECK: Approximately 60% stenosis RIGHT internal carotid artery by NASCET criteria. No large vessel occlusion. CTA HEAD: Mid grade stenosis of RIGHT anterior genu of the carotid siphon, suspected occlusion of RIGHT A1 segment with patent A-comm artery. High-grade stenosis of proximal RIGHT M2 branch. Mid grade stenosis of RIGHT vertebral artery, which predominately at terminates in the RIGHT pack of, and included distal thready RIGHT vertebral artery. Suspected high-grade stenosis of the RIGHT superior cerebellar artery origin. Multifocal high-grade stenosis of LEFT P2 segment. High-grade stenosis of RIGHT P1 segment with RIGHT posterior communicating artery present. Moderate luminal regularity of the cerebral arteries most consistent with atherosclerosis. Electronically Signed By: Elon Alas On: 04/19/2014 04:03     Assessment/Plan: Diagnosis: right cerebellar as well as other subcortical infarcts 1. Does the need for close, 24 hr/day medical supervision in concert with the patient's rehab needs make it unreasonable for this patient to be served in a less intensive setting? Yes 2. Co-Morbidities requiring supervision/potential complications: dm, sarcoma RLE with peroneal nerve injury, , htn 3. Due to bladder management, bowel management, safety, skin/wound care,  disease management, medication administration, pain management and patient education, does the patient require 24 hr/day rehab nursing? Yes 4. Does the patient require coordinated care of a physician, rehab nurse, PT (1-2 hrs/day, 5 days/week), OT (1-2 hrs/day, 5 days/week) and SLP (1-2 hrs/day, 5 days/week) to address physical and functional deficits in the context of the above medical diagnosis(es)? Yes Addressing deficits in the following areas: balance, endurance, locomotion, strength, transferring, bowel/bladder control, bathing, dressing, feeding, grooming, toileting, speech, swallowing and psychosocial support 5. Can the patient actively participate in an intensive therapy program of at least 3 hrs of therapy per day at least 5 days per week? Yes 6. The potential for patient to make measurable gains while on inpatient rehab is excellent 7. Anticipated functional outcomes upon discharge from inpatient rehab are modified independent with PT, modified independent with OT, modified independent and supervision with SLP. 8. Estimated rehab length of stay to reach the above functional goals is: 10-14 days 9. Does the patient have adequate  social supports and living environment to accommodate these discharge functional goals? Yes 10. Anticipated D/C setting: Home 11. Anticipated post D/C treatments: HH therapy and Outpatient therapy 12. Overall Rehab/Functional Prognosis: excellent  RECOMMENDATIONS: This patient's condition is appropriate for continued rehabilitative care in the following setting: CIR Patient has agreed to participate in recommended program. Yes Note that insurance prior authorization may be required for reimbursement for recommended care.  Comment: Rehab Admissions Coordinator to follow up.  Thanks,  Meredith Staggers, MD, [facility]     04/19/2014       Revision History     Date/Time User Provider Type Action   04/19/2014 2:25 PM Meredith Staggers, MD Physician Sign     04/19/2014 2:20 PM Cathlyn Parsons, PA-C Physician Assistant Pend   View Details Report       Routing History     Date/Time From To Method   04/19/2014 2:25 PM Meredith Staggers, MD Meredith Staggers, MD In Basket

## 2014-04-21 NOTE — Progress Notes (Signed)
MBSS complete. Full report located under chart review in imaging section.  RECOMMENDATIONS: -Dys 3 diet, nectar thick liquids -Meds whole in puree   [name], M.A. CCC-SLP [phone]

## 2014-04-21 NOTE — Discharge Instructions (Signed)
Follow with Primary MD in 7 days   Get CBC, CMP, 2 view Chest X ray checked  by Primary MD next visit.    Activity: As tolerated with Full fall precautions use walker/cane & assistance as needed   Disposition CIR   Diet: Dysphagia 3 heart healthy low-carb diet with feeding assistance and aspiration precautions.  For Heart failure patients - Check your Weight same time everyday, if you gain over 2 pounds, or you develop in leg swelling, experience more shortness of breath or chest pain, call your Primary MD immediately. Follow Cardiac Low Salt Diet and 1.5 lit/day fluid restriction.   On your next visit with your primary care physician please Get Medicines reviewed and adjusted.   Please request your Prim.MD to go over all Hospital Tests and Procedure/Radiological results at the follow up, please get all Hospital records sent to your Prim MD by signing hospital release before you go home.   If you experience worsening of your admission symptoms, develop shortness of breath, life threatening emergency, suicidal or homicidal thoughts you must seek medical attention immediately by calling 911 or calling your MD immediately  if symptoms less severe.  You Must read complete instructions/literature along with all the possible adverse reactions/side effects for all the Medicines you take and that have been prescribed to you. Take any new Medicines after you have completely understood and accpet all the possible adverse reactions/side effects.   Do not drive, operating heavy machinery, perform activities at heights, swimming or participation in water activities or provide baby sitting services if your were admitted for syncope or siezures until you have seen by Primary MD or a Neurologist and advised to do so again.  Do not drive when taking Pain medications.    Do not take more than prescribed Pain, Sleep and Anxiety Medications  Special Instructions: If you have smoked or chewed Tobacco  in  the last 2 yrs please stop smoking, stop any regular Alcohol  and or any Recreational drug use.  Wear Seat belts while driving.   Please note  You were cared for by a hospitalist during your hospital stay. If you have any questions about your discharge medications or the care you received while you were in the hospital after you are discharged, you can call the unit and asked to speak with the hospitalist on call if the hospitalist that took care of you is not available. Once you are discharged, your primary care physician will handle any further medical issues. Please note that NO REFILLS for any discharge medications will be authorized once you are discharged, as it is imperative that you return to your primary care physician (or establish a relationship with a primary care physician if you do not have one) for your aftercare needs so that they can reassess your need for medications and monitor your lab values.

## 2014-04-21 NOTE — Progress Notes (Signed)
Patient ID: Hayden Mckinney, male   DOB: [birth year], 69 y.o.   MRN: [id number] Patient admitted to 4W08 via wheelchair, escorted by nursing staff.  Patient verbalized understanding of rehab process, signed fall safety agreement.  Patient appears to be in no immediate distress at this time.  Will continue to monitor. Brita Romp, RN

## 2014-04-21 NOTE — Interval H&P Note (Signed)
Hayden Mckinney was admitted today to Inpatient Rehabilitation with the diagnosis of right cerebellar infarct.  The patient's history has been reviewed, patient examined, and there is no change in status.  Patient continues to be appropriate for intensive inpatient rehabilitation.  I have reviewed the patient's chart and labs.  Questions were answered to the patient's satisfaction.  Hayden Mckinney 04/21/2014, 7:04 PM

## 2014-04-21 NOTE — Progress Notes (Signed)
Rehab admissions - I sple with Dr. Candiss Norse.  Renal US was completed yesterday.  Patient has be medically cleared for acute inpatient rehab admission.  Bed available and will admit to acute inpatient rehab today.  Call me for questions.  RC:[phone]

## 2014-04-22 ENCOUNTER — Inpatient Hospital Stay (HOSPITAL_COMMUNITY): Payer: Medicare Other | Admitting: Occupational Therapy

## 2014-04-22 ENCOUNTER — Inpatient Hospital Stay (HOSPITAL_COMMUNITY): Payer: Medicare Other

## 2014-04-22 ENCOUNTER — Telehealth: Payer: Self-pay | Admitting: *Deleted

## 2014-04-22 ENCOUNTER — Inpatient Hospital Stay (HOSPITAL_COMMUNITY): Payer: Medicare Other | Admitting: Speech Pathology

## 2014-04-22 DIAGNOSIS — E1165 Type 2 diabetes mellitus with hyperglycemia: Secondary | ICD-10-CM

## 2014-04-22 LAB — GLUCOSE, CAPILLARY
Glucose-Capillary: 159 mg/dL — ABNORMAL HIGH (ref 70–99)
Glucose-Capillary: 152 mg/dL — ABNORMAL HIGH (ref 70–99)
Glucose-Capillary: 170 mg/dL — ABNORMAL HIGH (ref 70–99)
Glucose-Capillary: 186 mg/dL — ABNORMAL HIGH (ref 70–99)

## 2014-04-22 LAB — CBC
HCT: 31.2 % — ABNORMAL LOW (ref 39.0–52.0)
HEMOGLOBIN: 10.2 g/dL — AB (ref 13.0–17.0)
MCH: 28.3 pg (ref 26.0–34.0)
MCHC: 32.7 g/dL (ref 30.0–36.0)
MCV: 86.7 fL (ref 78.0–100.0)
Platelets: 189 10*3/uL (ref 150–400)
RBC: 3.6 MIL/uL — AB (ref 4.22–5.81)
RDW: 13.3 % (ref 11.5–15.5)
WBC: 5.4 10*3/uL (ref 4.0–10.5)

## 2014-04-22 MED ORDER — METFORMIN HCL 500 MG PO TABS: 500.0000 mg | ORAL_TABLET | Freq: Two times a day (BID) | ORAL | Status: DC

## 2014-04-22 NOTE — Evaluation (Signed)
Speech Language Pathology Assessment and Plan  Patient Details  Name: Hayden Mckinney MRN: [id number] Date of Birth: [birth year]  SLP Diagnosis: Dysarthria;Cognitive Impairments;Dysphagia  Rehab Potential: Good ELOS: 12-14 days    Today's Date: 04/22/2014 SLP Individual Time: 1330-1430 SLP Individual Time Calculation (min): 60 min   Problem List:  Patient Active Problem List   Diagnosis Date Noted  . Cerebellar infarct 04/21/2014  . Aspiration pneumonia 04/20/2014  . Acute kidney injury 04/20/2014  . Speech abnormality   . Stroke 04/16/2014  . Embolic stroke 29/92/4268  . Type II diabetes mellitus with neurological manifestations 08/25/2010  . HTN (hypertension) 08/25/2010  . Hyperlipidemia 08/25/2010   Past Medical History:  Past Medical History  Diagnosis Date  . Type II diabetes mellitus   . Sarcoma     a. R leg  . Diabetic peripheral neuropathy     a. both feet  . TIA (transient ischemic attack)     a. 08/2010.  Marland Kitchen Hyperlipidemia   . Hypertension   . Stroke     a. 04/2014 multiple bateral infarcts, presumed to be embolic;  b. 03/4194 Carotid U/S: 1-39% bilat ICA stenosis.  . H/O echocardiogram     a. 04/2014 Echo: EF 55-60%, no rwma.  Marland Kitchen ETOH abuse     a. 04/2014 18-24 beers q weekend.   Past Surgical History:  Past Surgical History  Procedure Laterality Date  . Sarcoma removal    . Repair of r hand after trauma    . Loop recorder implant N/A 04/20/2014    Procedure: LOOP RECORDER IMPLANT;  Surgeon: Thompson Grayer, MD;  Location: [facility] CATH LAB;  Service: Cardiovascular;  Laterality: N/A;  . Tee without cardioversion N/A 04/20/2014    Procedure: TRANSESOPHAGEAL ECHOCARDIOGRAM (TEE);  Surgeon: Larey Dresser, MD;  Location: [facility] ENDOSCOPY;  Service: Cardiovascular;  Laterality: N/A;    Assessment / Plan / Recommendation Clinical Impression Hayden Mckinney is a 69 y.o. right handed male with history of hypertension, diabetes mellitus peripheral neuropathy, TIA  maintained on aspirin 81 mg daily. Presented 04/16/2014 with right-sided weakness and slurred speech 2 weeks. MRI of the brain shows moderate size acute subacute nonhemorrhagic infarct mid to superior right cerebellar with local mass effect as well as tiny acute nonhemorrhagic posterior right operculum infarct, subacute to remote partially hemorrhagic infarct right lenticular nucleus mid right coronal radiata and remote bilateral thalamic and anterior left external capsule infarct. Echocardiogram with ejection fraction of 60% and no regional wall motion abnormalities. Carotid Dopplers with no ICA stenosis. Venous Dopplers lower extremities negative. CTA of the neck showed approximately 60% stenosis right internal carotid artery. CTA of the head with grade stenosis right vertebral artery. Patient did not receive TPA. TEE showed ejection fraction 65% without thrombus no PFO and underwent implantation of loop recorder 04/20/2014 Dr. Rayann Heman. Neurology consulted presently on aspirin/Plavix daily for CVA prophylaxis 3 months then Plavix alone and also placed on subcutaneous Lovenox for DVT prophylaxis. 04/20/2014. On 4/12 developed cough and low-grade fever chest x-ray showed left basilar atelectasis no focal confluent infiltrate and placed on intravenous empiric Unasyn and changed to Augmentin 6 days 04/21/2014. Additionally his creatinine was trending upwards of 1.55. Renal ultrasound negative. Modified barium swallow 04/21/2014 placed on Dys. 3 textures and nectar-thick liquids. Physical therapy evaluation completed with recommendations of physical medicine rehabilitation consult. Patient was admitted for comprehensive rehabilitation program on 04/21/14 and was administered a BSE and cognitive-linguistic evaluation. Per MBS, patient presents with mild-moderate oropharyngeal dysphagia characterized by decreased bolus  containment and delayed pharyngeal trigger with thin liquids ultimately resulting in sensed  aspiration. Patient was observed to consume nectar-thick liquids and Dys. 2 and Dys. 3 textures without overt s/s of aspiration of note and a timely oral transit, considering patient's edentulous state. Of note, patient reports bilateral anterior spillage of food although none was noted during this BSE and patient's labial and lingual strength and ROM appear grossly intact. Due to aspiration of thin liquids per aforementioned MBS, recommend patient continue current diet at this time. Patient furthermore presents with impulsivity and mild cognitive deficits characterized by decreased safety awareness, intellectual awareness, mildly complex problem-solving, and short-term recall. Additionally patient presents with moderate-severe dysarthria with overall intelligibility of ~25-50% at the phrase level. The aforementioned swallowing, speech, and cognitive deficits affect patient's ability to complete familiar and functional tasks safely, therefore recommend patient receive skilled SLP intervention to increase swallowing, speech, and cognitive status to enhance functional independence and reduce caregiver burden prior to discharge.  Skilled Therapeutic Interventions          Patient administered a BSE and cognitive-linguistic evaluation; see above for further details. Recommend patient continue with Dys. 3 textures and nectar-thick liquids at this time.  SLP Assessment  Patient will need skilled Speech Lanaguage Pathology Services during CIR admission    Recommendations  Diet Recommendations: Dysphagia 3 (Mechanical Soft);Nectar-thick liquid Liquid Administration via: Cup Medication Administration: Whole meds with puree Supervision: Patient able to self feed;Full supervision/cueing for compensatory strategies Compensations: Small sips/bites;Check for pocketing;Slow rate Postural Changes and/or Swallow Maneuvers: Seated upright 90 degrees;Upright 30-60 min after meal Oral Care Recommendations: Oral care  BID Recommendations for Other Services: Neuropsych consult Patient destination: Home Follow up Recommendations: 24 hour supervision/assistance;Home Health SLP Equipment Recommended: To be determined    SLP Frequency 3 to 5 out of 7 days   SLP Treatment/Interventions Cognitive remediation/compensation;Cueing hierarchy;Dysphagia/aspiration precaution training;Environmental controls;Therapeutic Exercise;Therapeutic Activities;Patient/family education;Oral motor exercises;Functional tasks;Internal/external aids;Speech/Language facilitation    Pain Pain Assessment Pain Assessment: No/denies pain  Short Term Goals: Week 1: SLP Short Term Goal 1 (Week 1): Patient will consume least restrictive diet with Min A verbal and visual cues for use of swallow strategies. SLP Short Term Goal 2 (Week 1): Patient will consume trials of thin liquids with minimal overt s/s of aspiration in 90% of trials over 3 consecutive sessions. SLP Short Term Goal 3 (Week 1): Patient will increase speech intelligibility to ~75% at the phrase level with Mod A visual and verbal cues for use of intelligibility strategies. SLP Short Term Goal 4 (Week 1): Patient will demonstrate mildly complex problem-solving in functional tasks given Min A visual and verbal cues in 75% of observable instances. SLP Short Term Goal 5 (Week 1): Patient will utilize external aid to recall new, daily information, such as diet restrictions, with Min A verbal and visual cues over 3 consecutive sessions. SLP Short Term Goal 6 (Week 1): Patient will identify 2 new physical and 2 new cognitive deficits with Min A verbal and question cues over 3 consecutive sessions.  See FIM for current functional status Refer to Care Plan for Long Term Goals  Recommendations for other services: Neuropsych  Discharge Criteria: Patient will be discharged from SLP if patient refuses treatment 3 consecutive times without medical reason, if treatment goals not met, if there  is a change in medical status, if patient makes no progress towards goals or if patient is discharged from hospital.  The above assessment, treatment plan, treatment alternatives and goals were discussed and mutually  agreed upon: by patient  Servando Snare 04/22/2014, 5:04 PM

## 2014-04-22 NOTE — Evaluation (Signed)
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 Cerebellar infarct 04/21/2014  . Aspiration pneumonia 04/20/2014  . Acute kidney injury 04/20/2014  . Speech abnormality   . Stroke 04/16/2014  . Embolic stroke 07/08/1599  . Type II diabetes mellitus with neurological manifestations 08/25/2010  . HTN (hypertension) 08/25/2010  . Hyperlipidemia 08/25/2010    Past Medical History:  Past Medical History  Diagnosis Date  . Type II diabetes mellitus   . Sarcoma     a. R leg  . Diabetic peripheral neuropathy     a. both feet  . TIA (transient ischemic attack)     a. 08/2010.  Marland Kitchen Hyperlipidemia   . Hypertension   . Stroke     a. 04/2014 multiple bateral infarcts, presumed to be embolic;  b. 0/9323 Carotid U/S: 1-39% bilat ICA stenosis.  . H/O echocardiogram     a. 04/2014 Echo: EF 55-60%, no rwma.  Marland Kitchen ETOH abuse     a. 04/2014 18-24 beers q weekend.   Past Surgical History:  Past Surgical History  Procedure Laterality Date  . Sarcoma removal    . Repair of r hand after trauma    . Loop recorder implant N/A 04/20/2014    Procedure: LOOP RECORDER IMPLANT;  Surgeon: Thompson Grayer, MD;  Location: [facility] C[facility] LAB;  Service: Cardiovascular;  Laterality: N/A;  . Tee without cardioversion N/A 04/20/2014    Procedure: TRANSESOPHAGEAL ECHOCARDIOGRAM (TEE);  Surgeon: Larey Dresser, MD;  Location: [facility];  Service: Cardiovascular;  Laterality: N/A;    Assessment & Plan Clinical  Impression: Hayden Mckinney is a 69 y.o. right handed male with history of hypertension, diabetes mellitus peripheral neuropathy, TIA maintained on aspirin 81 mg daily. Presented 04/16/2014 with right-sided weakness and slurred speech 2 weeks. MRI of the brain shows moderate size acute subacute nonhemorrhagic infarct mid to superior right cerebellar with local mass effect as well as tiny acute nonhemorrhagic posterior right operculum infarct, subacute to remote partially hemorrhagic infarct right lenticular nucleus mid right coronal radiata and remote bilateral thalamic and anterior left external capsule infarct. Echocardiogram with ejection fraction of 60% and no regional wall motion abnormalities. Carotid Dopplers with no ICA stenosis. Venous Dopplers lower extremities negative. CTA of the neck showed approximately 60% stenosis right internal carotid artery. CTA of the head with grade stenosis right vertebral artery. Patient did not receive TPA. TEE showed ejection fraction 65% without thrombus no PFO and underwent implantation of loop recorder 04/20/2014 Dr. Rayann Heman. Neurology consulted presently on aspirin/Plavix daily for CVA prophylaxis 3 months then Plavix alone and also placed on subcutaneous Lovenox for DVT prophylaxis. 04/20/2014. On 4/12 developed cough and low-grade fever chest x-ray showed left basilar atelectasis no focal confluent infiltrate and placed on intravenous empiric Unasyn and changed to Augmentin 6 days 04/21/2014. Additionally his creatinine was trending upwards of 1.55. Renal ultrasound negative. Modified barium swallow 04/21/2014 placed on a mechanical soft and nectar liquids. Physical therapy evaluation completed with recommendations of physical medicine rehabilitation consult. Patient was admitted for comprehensive rehabilitation program  Patient currently requires mod with basic self-care skills and IADL secondary to muscle weakness and muscle paralysis, decreased  cardiorespiratoy endurance, impaired timing and sequencing, unbalanced muscle activation, ataxia, decreased coordination and decreased motor planning, decreased motor planning, decreased attention, decreased awareness, decreased problem solving, decreased safety awareness and delayed processing, and decreased standing balance, decreased postural control, hemiplegia and decreased balance strategies.  Prior to hospitalization, patient could complete basic ADLs with modified independent .  Patient will benefit from skilled intervention to decrease level of assist with basic self-care skills and increase independence with basic self-care skills prior to discharge home with care partner.  Anticipate patient will require 24 hour supervision and follow up home health.  OT - End of Session Activity Tolerance: Tolerates 30+ min activity with multiple rests Endurance Deficit: Yes OT Assessment Rehab Potential (ACUTE ONLY): Good OT Patient demonstrates impairments in the following area(s): Balance;Behavior;Cognition;Endurance;Motor;Perception;Safety;Sensory OT Basic ADL's Functional Problem(s): Grooming;Bathing;Dressing;Toileting OT Advanced ADL's Functional Problem(s): Simple Meal Preparation OT Transfers Functional Problem(s): Toilet;Tub/Shower OT Additional Impairment(s): Fuctional Use of Upper Extremity OT Plan OT Intensity: Minimum of 1-2 x/day, 45 to 90 minutes OT Frequency: 5 out of 7 days OT Duration/Estimated Length of Stay: 10-12 days OT Treatment/Interventions: Balance/vestibular training;Cognitive remediation/compensation;Discharge planning;Community reintegration;DME/adaptive equipment instruction;Functional mobility training;Neuromuscular re-education;Pain management;Patient/family education;Psychosocial support;Self Care/advanced ADL retraining;Therapeutic Activities;Therapeutic Exercise;UE/LE Strength taining/ROM;UE/LE Coordination activities OT Self Feeding Anticipated Outcome(s):  Supervision OT Basic Self-Care Anticipated Outcome(s): Supervision OT Toileting Anticipated Outcome(s): Supervision OT Bathroom Transfers Anticipated Outcome(s): Supervision OT Recommendation Patient destination: Home Follow Up Recommendations: Home health OT;24 hour supervision/assistance Equipment Recommended: Tub/shower seat;To be determined   Skilled Therapeutic Intervention Session One: Pt seen for OT eval and ADL bathing and dressing session. Pt in supine upon arrival, eating breakfast with NT present supervising. Pt agreeable to tx. Pt transferred supine>EOB with min A and cues for safety. Pt cont to eat breakfast EOB with supervision, demonstrating ability to use R UE in functional manner. Pt then completed bathing and dressing seated EOB. Pt able to complete static UB dressing tasks with supervision, demonstrating ability to maintain unsupported sitting. Pt required mod steadying assist and increased cues for safety when complete dynamic functional tasks seated EOB. Pt required increased steadying assist when bending down to don B socks, and attempt to thread B LEs into pants. Pt able to thread R LE into pants, and required assist to thread L LE into pants due to decreased sitting balance and to maintain safe sitting position. Pt stood with mod steadying assist to pull pants up and required assist to fasten pants. Pt transferred EOB> recliner with mod A. He completed grooming task seated in recliner with supervision.  Pt left in recliner at end of session, pink safety belt donned and all needs in reach.   Pt educated regarding role of OT, POC, need for assist, need for safety, use of call bell, and d/c planning.   Pt with decreased awareness of deficits and decreased awareness of need for assist, with impulsive behaviors with mobility. He is able to follow cues for safety with min-mod cuing.  Session Two: Pt seen for OT session focusing on functional transfers, and UE strengthening and fine  motor coordination. Pt in w/c upon arrival, agreeable to tx. Pt self propeled w/c to therapy gym, ~25 yards, with min A for steering of w/c. Seated in w/c, pt completed 9 hole peg test, see results below. Pt then taken back to room with total A for time. Pt completed toilet transfer w/c <> toilet with  max A and use of grab bars for steadying and control to lower and rise with cues for safety. Pt required assist for management of clothing while standing and using grab bars. Pt completed hygiene seated with close supervision. At end of session Pt returned to w/c, and left with SLP.   OT Evaluation Precautions/Restrictions  Precautions Precautions: Fall Restrictions Weight Bearing Restrictions: No General Chart Reviewed: Yes Pain Pain Assessment Pain Assessment: No/denies pain Home Living/Prior Functioning Home Living Family/patient expects to be discharged to:: Private residence Living Arrangements: Non-relatives/Friends Available Help at Discharge: Family, Friend(s), Available 24 hours/day, Available PRN/intermittently Type of Home: Mobile home Home Access: Stairs to enter Technical brewer of Steps: 2 Entrance Stairs-Rails: None Home Layout: One level Additional Comments: Pt reports he may stay at his sister's place after d/c because she is available 24/7 where as his roommate works sometimes during the day  Lives With: Other (Comment) Medical sales representative) Prior Function Level of Independence: Requires assistive device for independence, Independent with homemaking with ambulation, Independent with gait, Independent with transfers  Able to Take Stairs?: Yes Driving: No Vocation: Part time employment Vocation Requirements: 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 Difficulty assessing PLOF due to slurred speech.  Vision/Perception  Vision-  History Baseline Vision/History: Wears glasses Wears Glasses: At all times Patient Visual Report: No change from baseline Vision- Assessment Additional Comments: Pt denied visual changes  Cognition Overall Cognitive Status: Impaired/Different from baseline Arousal/Alertness: Awake/alert Orientation Level: Oriented to person;Oriented to place Attention: Divided Divided Attention: Appears intact Memory: Appears intact Awareness: Impaired Awareness Impairment: Emergent impairment Problem Solving: Impaired Problem Solving Impairment: Verbal complex;Functional complex Behaviors: Impulsive Safety/Judgment: Impaired Comments: Pt impulsive with mobility and appears to have decreased insight into deficits and need for assist.  Sensation Sensation Light Touch: Impaired Detail (Pt reports decreased sensation in L UE including ring finger and little finger up through forearm) Light Touch Impaired Details: Impaired RLE;Impaired LUE Proprioception: Impaired Detail Proprioception Impaired Details: Impaired RLE Coordination Gross Motor Movements are Fluid and Coordinated: No Fine Motor Movements are Fluid and Coordinated: No Coordination and Movement Description: decreased during mobility Finger Nose Finger Test: Decreased accuary in B UEs; speed intact 9 Hole Peg Test: R: 84 seconds L:102 seconds Motor  Motor Motor: Other (comment);Motor impersistence;Abnormal postural alignment and control;Ataxia (R hemi paresis) Mobility  Bed Mobility Bed Mobility: Rolling Left Rolling Left: 4: Min assist Rolling Left Details: Manual facilitation for weight shifting;Verbal cues for technique Transfers Transfers: Sit to Stand;Stand to Sit Sit to Stand: 3: Mod assist Sit to Stand Details: Manual facilitation for weight shifting;Verbal cues for technique;Verbal cues for precautions/safety;Manual facilitation for placement Sit to Stand Details (indicate cue type and reason): Mod A from EOB; support  required on L side; pt with L posterior lean; VCs for safety Stand to Sit: 3: Mod assist Stand to Sit Details (indicate cue type and reason): Manual facilitation for weight shifting;Verbal cues for technique Stand to Sit Details: Assist for controlled descent  Trunk/Postural Assessment  Cervical Assessment Cervical Assessment: Within Functional Limits Thoracic Assessment Thoracic Assessment: Exceptions to [facility] (Flexed posture) Lumbar Assessment Lumbar Assessment: Exceptions to [facility] (Posterior pelvic tilt) Postural Control Postural Control: Deficits on evaluation Postural Limitations: Pt with L and posterior lean when standing during fucntional tasks.   Balance Balance Balance Assessed: Yes Static Sitting Balance Static Sitting - Level  of Assistance: 5: Stand by assistance Dynamic Sitting Balance Dynamic Sitting - Level of Assistance: 4: Min assist Static Standing Balance Static Standing - Level of Assistance: 4: Min assist;3: Mod assist Dynamic Standing Balance Dynamic Standing - Level of Assistance: 3: Mod assist Extremity/Trunk Assessment RUE Assessment RUE Assessment: Exceptions to [facility] RUE Strength RUE Overall Strength: Deficits;Other (Comment) LUE Assessment LUE Assessment: Exceptions to [facility] (Impaired coordination)  FIM:  FIM - Eating Eating Activity: 5: Supervision/cues FIM - Bed/Chair Transfer Bed/Chair Transfer Assistive Devices: Arm rests Bed/Chair Transfer: 5: Supine > Sit: Supervision (verbal cues/safety issues);5: Sit > Supine: Supervision (verbal cues/safety issues);2: Bed > Chair or W/C: Max A (lift and lower assist);2: Chair or W/C > Bed: Max A (lift and lower assist)   Refer to Care Plan for Long Term Goals  Recommendations for other services: None  Discharge Criteria: Patient will be discharged from OT if patient refuses treatment 3 consecutive times without medical reason, if treatment goals not met, if there is a change in medical status, if patient makes  no progress towards goals or if patient is discharged from hospital.  The above assessment, treatment plan, treatment alternatives and goals were discussed and mutually agreed upon: by patient  Bobby Rumpf, Mahmoud Blazejewski C 04/22/2014, 10:59 AM

## 2014-04-22 NOTE — Progress Notes (Signed)
[facility] PHYSICAL MEDICINE & REHABILITATION     PROGRESS NOTE    Subjective/Complaints: Had a good night. Pt denies nausea, vomiting, abdominal pain, diarrhea, chest pain, shortness of breath, palpitations, dizziness   Objective: Vital Signs: Blood pressure 151/63, pulse 65, temperature 98 F (36.7 C), temperature source Oral, resp. rate 18, weight 64.774 kg (142 lb 12.8 oz), SpO2 98 %. US Renal  04/20/2014   CLINICAL DATA:  Acute renal insufficiency.  EXAM: RENAL/URINARY TRACT ULTRASOUND COMPLETE  COMPARISON:  None.  FINDINGS: Right Kidney:  Length: 12.3 cm. Echogenicity within normal limits. No mass or hydronephrosis visualized.  Left Kidney:  Length: 11.8 cm. Echogenicity within normal limits. No mass or hydronephrosis visualized.  Bladder:  Appears normal for degree of bladder distention.  IMPRESSION: Negative exam.   Electronically Signed   By: Inge Rise M.D.   On: 04/20/2014 18:56   Dg Chest Port 1 View  04/20/2014   CLINICAL DATA:  Acute renal injury  EXAM: PORTABLE CHEST - 1 VIEW  COMPARISON:  11/19/2005  FINDINGS: Cardiac shadow is within normal limits. An external monitoring device is noted over the left chest. The overall inspiratory effort is poor with some left basilar atelectasis. No focal confluent infiltrate is seen. No bony abnormality is noted.  IMPRESSION: Left basilar atelectasis.   Electronically Signed   By: Inez Catalina M.D.   On: 04/20/2014 13:20   Dg Swallowing Func-speech Pathology  04/21/2014    Objective Swallowing Evaluation:    Patient Details  Name: Hayden Mckinney MRN: [id number] Date of Birth: 1945/08/02  Today's Date: 04/21/2014 Time: SLP Start Time (ACUTE ONLY): 1210-SLP Stop Time (ACUTE ONLY): 1225 SLP Time Calculation (min) (ACUTE ONLY): 15 min  Past Medical History:  Past Medical History  Diagnosis Date  . Type II diabetes mellitus   . Sarcoma     a. R leg  . Diabetic peripheral neuropathy     a. both feet  . TIA (transient ischemic attack)     a.  08/2010.  Marland Kitchen Hyperlipidemia   . Hypertension   . Stroke     a. 04/2014 multiple bateral infarcts, presumed to be embolic;  b. [id number]  Carotid U/S: 1-39% bilat ICA stenosis.  . H/O echocardiogram     a. 04/2014 Echo: EF 55-60%, no rwma.  Marland Kitchen ETOH abuse     a. 04/2014 18-24 beers q weekend.   Past Surgical History:  Past Surgical History  Procedure Laterality Date  . Sarcoma removal    . Repair of r hand after trauma    . Loop recorder implant N/A 04/20/2014    Procedure: LOOP RECORDER IMPLANT;  Surgeon: Thompson Grayer, MD;  Location:  [facility] CATH LAB;  Service: Cardiovascular;  Laterality: N/A;  . Tee without cardioversion N/A 04/20/2014    Procedure: TRANSESOPHAGEAL ECHOCARDIOGRAM (TEE);  Surgeon: Larey Dresser, MD;  Location: [facility] ENDOSCOPY;  Service: Cardiovascular;   Laterality: N/A;   HPI:  HPI: 69 y.o. male, with past medical history significant for hypertension  and diabetes and TIAs in the past presenting with 2 weeks history of  increasing weakness, falls and slurred speech that was noted today by his  sister who was visiting him. It was difficult to get history from the  patient due to his speech. Patient denies any history of chest pains  shortness of breath nausea vomiting or headaches. Patient lives with a  roommate at home.  No Data Recorded  Assessment / Plan / Recommendation CHL IP CLINICAL IMPRESSIONS  04/21/2014  Dysphagia Diagnosis Mild pharyngeal phase dysphagia;Mild oral phase  dysphagia;Moderate oral phase dysphagia   Clinical impression Pt has a mild-moderate oropharyngeal dysphagia with  decreased bolus containment and cohesion secondary to lingual weakness and  decreased sensation per pt report. Oral phase overall is not significantly  prolonged with small bites, considering that pt is edentulous. Pt has a  delayed pharyngeal trigger with liquids, with adequate containment in the  valleculae before the swallow with nectar thick liquids. He has some  spillage to the pyriform sinsues with thin liquids, with  frequent flash  penetration and ultimately sensed aspiration. Given the above as well as  concern for new aspiration PNA, recommend Dys 3 diet and nectar thick  liquids with continued SLP f/u to determine readiness to advance as oral  control improves.      CHL IP TREATMENT RECOMMENDATION 04/21/2014  Treatment Plan Recommendations Therapy as outlined in treatment plan below      CHL IP DIET RECOMMENDATION 04/21/2014  Diet Recommendations Dysphagia 3 (Mechanical Soft);Nectar-thick liquid  Liquid Administration via Cup;Straw  Medication Administration Whole meds with puree  Compensations Slow rate;Small sips/bites  Postural Changes and/or Swallow Maneuvers Seated upright 90  degrees;Upright 30-60 min after meal     CHL IP OTHER RECOMMENDATIONS 04/21/2014  Recommended Consults (None)  Oral Care Recommendations Oral care BID  Other Recommendations Order thickener from pharmacy;Prohibited food  (jello, ice cream, thin soups);Remove water pitcher     CHL IP FOLLOW UP RECOMMENDATIONS 04/21/2014  Follow up Recommendations Inpatient Rehab     CHL IP FREQUENCY AND DURATION 04/21/2014  Speech Therapy Frequency (ACUTE ONLY) min 2x/week  Treatment Duration 2 weeks     Pertinent Vitals/Pain: n/a     SLP Swallow Goals  CHL IP REASON FOR REFERRAL 04/21/2014  Reason for Referral Objectively evaluate swallowing function     CHL IP ORAL PHASE 04/21/2014  Lips (None)  Tongue (None)  Mucous membranes (None)  Nutritional status (None)  Other (None)  Oxygen therapy (None)  Oral Phase Impaired  Oral - Pudding Teaspoon (None)  Oral - Pudding Cup (None)  Oral - Honey Teaspoon (None)  Oral - Honey Cup (None)  Oral - Honey Syringe (None)  Oral - Nectar Teaspoon (None)  Oral - Nectar Cup (None)  Oral - Nectar Straw Weak lingual manipulation;Lingual/palatal residue  Oral - Nectar Syringe (None)  Oral - Ice Chips (None)  Oral - Thin Teaspoon (None)  Oral - Thin Cup Weak lingual manipulation  Oral - Thin Straw Weak lingual manipulation  Oral - Thin  Syringe (None)  Oral - Puree Weak lingual manipulation;Lingual/palatal residue  Oral - Mechanical Soft Weak lingual manipulation;Lingual/palatal residue  Oral - Regular (None)  Oral - Multi-consistency (None)  Oral - Pill Weak lingual manipulation  Oral Phase - Comment (None)      CHL IP PHARYNGEAL PHASE 04/21/2014  Pharyngeal Phase Impaired  Pharyngeal - Pudding Teaspoon (None)  Penetration/Aspiration details (pudding teaspoon) (None)  Pharyngeal - Pudding Cup (None)  Penetration/Aspiration details (pudding cup) (None)  Pharyngeal - Honey Teaspoon (None)  Penetration/Aspiration details (honey teaspoon) (None)  Pharyngeal - Honey Cup (None)  Penetration/Aspiration details (honey cup) (None)  Pharyngeal - Honey Syringe (None)  Penetration/Aspiration details (honey syringe) (None)  Pharyngeal - Nectar Teaspoon (None)  Penetration/Aspiration details (nectar teaspoon) (None)  Pharyngeal - Nectar Cup (None)  Penetration/Aspiration details (nectar cup) (None)  Pharyngeal - Nectar Straw Delayed swallow initiation  Penetration/Aspiration details (nectar straw) (None)  Pharyngeal - Nectar Syringe (None)  Penetration/Aspiration  details (nectar syringe) (None)  Pharyngeal - Ice Chips (None)  Penetration/Aspiration details (ice chips) (None)  Pharyngeal - Thin Teaspoon (None)  Penetration/Aspiration details (thin teaspoon) (None)  Pharyngeal - Thin Cup Delayed swallow initiation;Penetration/Aspiration  during swallow;Premature spillage to pyriform sinuses  Penetration/Aspiration details (thin cup) Material enters airway, remains  ABOVE vocal cords then ejected out  Pharyngeal - Thin Straw Delayed swallow initiation;Premature spillage to  pyriform sinuses;Penetration/Aspiration before swallow  Penetration/Aspiration details (thin straw) Material enters airway, passes  BELOW cords and not ejected out despite cough attempt by patient  Pharyngeal - Thin Syringe (None)  Penetration/Aspiration details (thin syringe') (None)   Pharyngeal - Puree WFL  Penetration/Aspiration details (puree) (None)  Pharyngeal - Mechanical Soft WFL  Penetration/Aspiration details (mechanical soft) (None)  Pharyngeal - Regular (None)  Penetration/Aspiration details (regular) (None)  Pharyngeal - Multi-consistency (None)  Penetration/Aspiration details (multi-consistency) (None)  Pharyngeal - Pill WFL  Penetration/Aspiration details (pill) (None)  Pharyngeal Comment (None)     CHL IP CERVICAL ESOPHAGEAL PHASE 04/21/2014  Cervical Esophageal Phase WFL  Pudding Teaspoon (None)  Pudding Cup (None)  Honey Teaspoon (None)  Honey Cup (None)  Honey Syringe (None)  Nectar Teaspoon (None)  Nectar Cup (None)  Nectar Straw (None)  Nectar Syringe (None)  Thin Teaspoon (None)  Thin Cup (None)  Thin Straw (None)  Thin Syringe (None)  Cervical Esophageal Comment (None)         [name], M.A. CCC-SLP [phone]  [name] 04/21/2014, 2:01 PM     Recent Labs  04/21/14 1715 04/22/14 0639  WBC 6.0 5.4  HGB 11.1* 10.2*  HCT 33.8* 31.2*  PLT 231 189    Recent Labs  04/20/14 0714 04/21/14 0434 04/21/14 1715  NA 141 143  --   K 3.6 3.7  --   CL 110 115*  --   GLUCOSE 179* 148*  --   BUN 26* 25*  --   CREATININE 1.55* 1.54* 1.53*  CALCIUM 8.8 8.3*  --    CBG (last 3)   Recent Labs  04/21/14 1631 04/21/14 2106 04/22/14 0651  GLUCAP 189* 159* 159*    Wt Readings from Last 3 Encounters:  04/21/14 64.774 kg (142 lb 12.8 oz)  04/16/14 67.1 kg (147 lb 14.9 oz)  12/14/11 76.613 kg (168 lb 14.4 oz)    Physical Exam:   Gen:no acute distress,  HENT:edentulous. Oral mucosa pink and moist Head: Normocephalic.  Eyes: EOM are normal.  Neck: Normal range of motion. Neck supple. No thyromegaly present.  Cardiovascular: Normal rate and regular rhythm.no murmurs or rubs  Respiratory: Effort normal and breath sounds normal. No respiratory distress. no wheezes GI: Soft. Bowel sounds are normal. He exhibits no distension.  Non-tender Neurological: He is alert.  Speech is  dysarthric but intelligibile. He is able to provide his name and age as well as date of birth. Followed simple commands. Right limb ataxia, right PD. RUE motor 4 to 4+/5 delt,bic,tri,wrist, hand. RLE: 4/5 hf,ke. 0 to trace ADF and 3-4/5 APF. Sensation intact to pain on right side Skin: Skin is warm and dry.  Psych: pt pleasant and appropriate  Assessment/Plan: 1. 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 Physiatrist is providing close team supervision and 24 hour management of active medical problems listed below. Physiatrist and rehab team continue to assess barriers to discharge/monitor patient progress toward functional and medical goals. FIM:  Medical Problem List and Plan: 1. 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 DVT Prophylaxis/Anticoagulation: Subcutaneous Lovenox. Monitor platelet counts and any signs of bleeding. Venous Dopplers negative 3. Pain Management: Lyrica 50 mg twice a day Tylenol as needed 4. Hypertension. Coreg 6.25 mg twice a day,BIDIL 20-30 7.5 mg 3 times a day. Monitor with increased mobility  5. Neuropsych: This patient is capable of making decisions on his own behalf. 6. Skin/Wound Care: Routine skin checks  7. Fluids/Electrolytes/Nutrition: Strict I&O's with follow-up chemistries 8. Diabetes mellitus with peripheral neuropathy. Latest hemoglobin A1c 7.9. Presently with sliding scale insulin. Patient on Glucophage 1000 mg twice a day prior to admission. Resume at 500mg  bid today 9. Question left lower lobe pneumonia. Continue antibiotic therapy for now with Augmentin 6 days. Encourage incentive spirometry. Follow-up chest x-ray if indicated  -?need for  abx 10. Acute on chronic renal insufficiency. Latest creatinine 1.54. Renal ultrasound negative. Follow-up chemistries 11. Dysphagia. Diet changed to mechanical soft nectar liquids after modified barium swallow 04/21/2014.    -tolerating thus far   LOS (Days) 1 A FACE TO FACE EVALUATION WAS PERFORMED  Hayden Mckinney 04/22/2014 8:00 AM

## 2014-04-22 NOTE — Evaluation (Signed)
Physical Therapy Assessment and Plan  Patient Details  Name: Hayden Mckinney MRN: [id number] Date of Birth: 23-May-1945  PT Diagnosis: Ataxic gait, Difficulty walking, Hemiparesis dominant, Impaired cognition, Impaired sensation, Muscle weakness, and Pain Rehab Potential: Good ELOS: 12-14 days   Today's Date: 04/22/2014 PT Individual Time: 1000-1100 PT Individual Time Calculation (min): 60 min    Problem List:  Patient Active Problem List   Diagnosis Date Noted  . Cerebellar infarct 04/21/2014  . Aspiration pneumonia 04/20/2014  . Acute kidney injury 04/20/2014  . Speech abnormality   . Stroke 04/16/2014  . Embolic stroke 78/29/5621  . Type II diabetes mellitus with neurological manifestations 08/25/2010  . HTN (hypertension) 08/25/2010  . Hyperlipidemia 08/25/2010    Past Medical History:  Past Medical History  Diagnosis Date  . Type II diabetes mellitus   . Sarcoma     a. R leg  . Diabetic peripheral neuropathy     a. both feet  . TIA (transient ischemic attack)     a. 08/2010.  Marland Kitchen Hyperlipidemia   . Hypertension   . Stroke     a. 04/2014 multiple bateral infarcts, presumed to be embolic;  b. 03/863 Carotid U/S: 1-39% bilat ICA stenosis.  . H/O echocardiogram     a. 04/2014 Echo: EF 55-60%, no rwma.  Marland Kitchen ETOH abuse     a. 04/2014 18-24 beers q weekend.   Past Surgical History:  Past Surgical History  Procedure Laterality Date  . Sarcoma removal    . Repair of r hand after trauma    . Loop recorder implant N/A 04/20/2014    Procedure: LOOP RECORDER IMPLANT;  Surgeon: Thompson Grayer, MD;  Location: [facility] CATH LAB;  Service: Cardiovascular;  Laterality: N/A;  . Tee without cardioversion N/A 04/20/2014    Procedure: TRANSESOPHAGEAL ECHOCARDIOGRAM (TEE);  Surgeon: Larey Dresser, MD;  Location: [facility] ENDOSCOPY;  Service: Cardiovascular;  Laterality: N/A;    Assessment & Plan Clinical Impression: Patient is a 69 y.o. year old right handed male with history of hypertension,  diabetes mellitus peripheral neuropathy, TIA maintained on aspirin 81 mg daily. Presented 04/16/2014 with right-sided weakness and slurred speech 2 weeks. MRI of the brain shows moderate size acute subacute nonhemorrhagic infarct mid to superior right cerebellar with local mass effect as well as tiny acute nonhemorrhagic posterior right operculum infarct, subacute to remote partially hemorrhagic infarct right lenticular nucleus mid right coronal radiata and remote bilateral thalamic and anterior left external capsule infarct. Echocardiogram with ejection fraction of 60% and no regional wall motion abnormalities. Carotid Dopplers with no ICA stenosis. Venous Dopplers lower extremities negative. CTA of the neck showed approximately 60% stenosis right internal carotid artery. CTA of the head with grade stenosis right vertebral artery. Patient did not receive TPA. TEE showed ejection fraction 65% without thrombus no PFO and underwent implantation of loop recorder 04/20/2014  Dr. Rayann Heman. Neurology consulted presently on aspirin/Plavix daily for CVA prophylaxis 3 months then Plavix alone and also placed on subcutaneous Lovenox for DVT prophylaxis. 04/20/2014. On 4/12 developed cough and low-grade fever chest x-ray showed left basilar atelectasis no focal confluent infiltrate and placed on intravenous empiric Unasyn and changed to Augmentin 6 days 04/21/2014. Additionally his creatinine was trending upwards of 1.55. Renal ultrasound negative. Modified barium swallow 04/21/2014 placed on a mechanical soft and nectar liquids. Physical therapy evaluation completed with recommendations of physical medicine rehabilitation consult..  Patient transferred to CIR on 04/21/2014 .   Patient currently requires mod A for transfers and mod +  2 for gait with mobility secondary to muscle weakness, muscle joint tightness and muscle paralysis, decreased cardiorespiratoy endurance, impaired timing and sequencing, ataxia and decreased  coordination, decreased awareness and decreased safety awareness and decreased sitting balance, decreased standing balance, decreased postural control, decreased balance strategies and hemiparesis (R).  Prior to hospitalization, patient was modified independent  with mobility and lived with Other (Comment) (Roommate) in a Mobile home home.  Home access is 2Stairs to enter. Pt reports he plans to discharge to his sister's house who can provide 24/7 S and whose home access is 4 steps to enter with bilateral rails.   Patient will benefit from skilled PT intervention to maximize safe functional mobility, minimize fall risk and decrease caregiver burden for planned discharge home with 24 hour supervision.  Anticipate patient will benefit from follow up [place] at discharge.  PT - End of Session Endurance Deficit: Yes PT Assessment Rehab Potential (ACUTE/IP ONLY): Good Barriers to Discharge: Decreased caregiver support PT Patient demonstrates impairments in the following area(s): Balance;Behavior;Endurance;Motor;Pain;Sensory;Safety;Skin Integrity PT Transfers Functional Problem(s): Bed Mobility;Bed to Chair;Car;Furniture PT Locomotion Functional Problem(s): Ambulation;Wheelchair Mobility;Stairs PT Plan PT Intensity: Minimum of 1-2 x/day ,45 to 90 minutes PT Frequency: 5 out of 7 days PT Duration Estimated Length of Stay: 12-14 days PT Treatment/Interventions: Ambulation/gait training;Balance/vestibular training;Cognitive remediation/compensation;Discharge planning;Disease management/prevention;DME/adaptive equipment instruction;Functional mobility training;Neuromuscular re-education;Pain management;Patient/family education;Skin care/wound management;Splinting/orthotics;Psychosocial support;Community reintegration;Stair training;Therapeutic Activities;Therapeutic Exercise;UE/LE Strength taining/ROM;UE/LE Coordination activities;Wheelchair propulsion/positioning PT Transfers Anticipated Outcome(s): S to min  A PT Locomotion Anticipated Outcome(s): min A gait; S w/c mobility PT Recommendation Follow Up Recommendations: Home health PT;24 hour supervision/assistance Patient destination: Home (sister's home) Equipment Recommended: Wheelchair (measurements);Wheelchair cushion (measurements) (AD for gait TBD)  Skilled Therapeutic Intervention Individual treatment initiated with focus on functional transfers, safety awareness, gait, stairs, postural control, and w/c mobility. Pt demonstrates decreased safety awareness with mobility and very impulsive during session. Cues for hand placement and technique during transfers as well as to slow down. Ataxia noted during gait trial and narrow BOS. +2 HHA (mod A +2) and then trial with RW (pt reports he liked using that on the other floor) still with mod A +2 for safety and cues for upright posture, control of RW, and foot placement during gait.   Pt presented in recliner upon arrival  With safety belt donned and when told we would transfer to w/c, pt pulled off safety belt and started to stand up without chair even being close. Pt also attempted to fill up water cup at sink with thin liquids - reviewed with patient that he is on nectar thick liquids. Offered to thicken water for patient, but he refused. Recommended for pt to stay at nurses's station for safety with quick release belt on.   PT Evaluation Precautions/Restrictions Precautions Precautions: Fall Restrictions Weight Bearing Restrictions: No Pain Reports generalized soreness; no intervention needed.  Home Living/Prior Functioning Home Living Available Help at Discharge: Family;Friend(s);Available 24 hours/day;Available PRN/intermittently Type of Home: Mobile home Home Access: Stairs to enter Entrance Stairs-Number of Steps: 2 Entrance Stairs-Rails: None Home Layout: One level Additional Comments: Pt reports he may stay at his sister's place after d/c because she is available 24/7 where as his  roommate works sometimes during the day  Lives With: Other (Comment) (Roommate) Prior Function Level of Independence: Requires assistive device for independence;Independent with homemaking with ambulation;Independent with gait;Independent with transfers  Able to Take Stairs?: Yes Driving: No Vocation: Part time employment Vocation Requirements: 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 Difficulty assessing PLOF due to slurred speech.  Cognition Overall Cognitive Status: Impaired/Different from baseline Arousal/Alertness: Awake/alert Awareness: Impaired Behaviors: Impulsive Safety/Judgment: Impaired Comments: Pt impulsive with mobility and appears to have decreased insight into deficits and need for assist. Pt reports he is able to transfer himself to the toilet just using his left side. Sensation Sensation Light Touch: Impaired Detail Light Touch Impaired Details: Impaired RLE Proprioception: Impaired Detail Proprioception Impaired Details: Impaired RLE Coordination Gross Motor Movements are Fluid and Coordinated: No Coordination and Movement Description: decreased during mobility Finger Nose Finger Test: Decreased accuary in B UEs; speed intact Motor  Motor Motor: Other (comment);Motor impersistence;Abnormal postural alignment and control;Ataxia (R hemi paresis)   Locomotion  Ambulation Ambulation/Gait Assistance: 3: Mod assist;1: +2 Total assist  Trunk/Postural Assessment  Cervical Assessment Cervical Assessment: Within Functional Limits Thoracic Assessment Thoracic Assessment: Exceptions to [facility] (Flexed posture) Lumbar Assessment Lumbar Assessment: Exceptions to [facility] (Posterior pelvic tilt) Postural Control Postural Control: Deficits on evaluation Postural Limitations: Pt with L and posterior lean when standing during  fucntional tasks.   Balance Balance Balance Assessed: Yes Static Sitting Balance Static Sitting - Level of Assistance: 5: Stand by assistance Dynamic Sitting Balance Dynamic Sitting - Level of Assistance: 4: Min assist Static Standing Balance Static Standing - Level of Assistance: 4: Min assist;3: Mod assist Dynamic Standing Balance Dynamic Standing - Level of Assistance: 3: Mod assist Extremity Assessment  RUE Assessment RUE Assessment: Exceptions to [facility] RUE Strength RUE Overall Strength: Deficits;Other (Comment) (4/5 shoulder flex/ext.; 3+/5 grip strength) LUE Assessment LUE Assessment: Within Functional Limits RLE Assessment RLE Assessment: Exceptions to [facility] RLE Strength RLE Overall Strength Comments: grossly 3+ except trace at ankle  LLE Assessment LLE Assessment: Within Functional Limits (decreased muscular endurance)  FIM:  FIM - Bed/Chair Transfer Bed/Chair Transfer Assistive Devices: Arm rests Bed/Chair Transfer: 5: Supine > Sit: Supervision (verbal cues/safety issues);5: Sit > Supine: Supervision (verbal cues/safety issues);2: Bed > Chair or W/C: Max A (lift and lower assist);2: Chair or W/C > Bed: Max A (lift and lower assist) FIM - Locomotion: Wheelchair Locomotion: Wheelchair: 2: Travels 50 - 149 ft with minimal assistance (Pt.>75%) FIM - Locomotion: Ambulation Ambulation/Gait Assistance: 3: Mod assist;1: +2 Total assist Locomotion: Ambulation: 1: Two helpers FIM - Locomotion: Stairs Locomotion: Scientist, physiological: Insurance account manager - 2 Locomotion: Stairs: 1: Up and Down < 4 stairs with moderate assistance (Pt: 50 - 74%)   Refer to Care Plan for Long Term Goals  Recommendations for other services: None  Discharge Criteria: Patient will be discharged from PT if patient refuses treatment 3 consecutive times without medical reason, if treatment goals not met, if there is a change in medical status, if patient makes no progress towards goals or if patient is  discharged from hospital.  The above assessment, treatment plan, treatment alternatives and goals were discussed and mutually agreed upon: by patient  Juanna Cao, PT, DPT  04/22/2014, 12:25 PM

## 2014-04-22 NOTE — Telephone Encounter (Signed)
Called pt concerning TCM no answer LMOM RTC.../lmb 

## 2014-04-22 NOTE — Progress Notes (Signed)
PHARMACIST - PHYSICIAN COMMUNICATION DR:  Naaman Plummer CONCERNING:  METFORMIN SAFE ADMINISTRATION POLICY  RECOMMENDATION: Metformin has been placed on DISCONTINUE (rejected order) STATUS and should be reordered only after any of the conditions below are ruled out.  Current safety recommendations include avoiding metformin for a minimum of 48 hours after the patient's exposure to intravenous contrast media.  DESCRIPTION:  The Pharmacy Committee has adopted a policy that restricts the use of metformin in hospitalized patients until all the contraindications to administration have been ruled out. Specific contraindications are: [x]  Serum creatinine ? 1.5 for males []  Serum creatinine ? 1.4 for females []  Shock, acute MI, sepsis, hypoxemia, dehydration []  Planned administration of intravenous iodinated contrast media []  Heart Failure patients with low EF []  Acute or chronic metabolic acidosis (including DKA)  Baran Kuhrt S. Alford Highland, PharmD, [place] Clinical Staff Pharmacist Pager [phone]

## 2014-04-23 ENCOUNTER — Inpatient Hospital Stay (HOSPITAL_COMMUNITY): Payer: Medicare Other | Admitting: Occupational Therapy

## 2014-04-23 ENCOUNTER — Inpatient Hospital Stay (HOSPITAL_COMMUNITY): Payer: Medicare Other | Admitting: Speech Pathology

## 2014-04-23 ENCOUNTER — Inpatient Hospital Stay (HOSPITAL_COMMUNITY): Payer: Medicare Other | Admitting: Physical Therapy

## 2014-04-23 DIAGNOSIS — R339 Retention of urine, unspecified: Secondary | ICD-10-CM

## 2014-04-23 DIAGNOSIS — E1140 Type 2 diabetes mellitus with diabetic neuropathy, unspecified: Secondary | ICD-10-CM

## 2014-04-23 LAB — URINE MICROSCOPIC-ADD ON

## 2014-04-23 LAB — GLUCOSE, CAPILLARY
GLUCOSE-CAPILLARY: 154 mg/dL — AB (ref 70–99)
GLUCOSE-CAPILLARY: 203 mg/dL — AB (ref 70–99)
Glucose-Capillary: 167 mg/dL — ABNORMAL HIGH (ref 70–99)
Glucose-Capillary: 226 mg/dL — ABNORMAL HIGH (ref 70–99)

## 2014-04-23 LAB — URINALYSIS, ROUTINE W REFLEX MICROSCOPIC
BILIRUBIN URINE: NEGATIVE
GLUCOSE, UA: 250 mg/dL — AB
Hgb urine dipstick: NEGATIVE
Ketones, ur: NEGATIVE mg/dL
Leukocytes, UA: NEGATIVE
Nitrite: NEGATIVE
PROTEIN: 100 mg/dL — AB
Specific Gravity, Urine: 1.021 (ref 1.005–1.030)
UROBILINOGEN UA: 0.2 mg/dL (ref 0.0–1.0)
pH: 5 (ref 5.0–8.0)

## 2014-04-23 NOTE — Progress Notes (Signed)
Occupational Therapy Session Note  Patient Details  Name: Hayden Mckinney MRN: [id number] Date of Birth: January 27, 1945  Today's Date: 04/23/2014 OT Individual Time: 1000-1100 OT Individual Time Calculation (min): 60 min    Short Term Goals: Week 1:  OT Short Term Goal 1 (Week 1): Pt will complete toilet transfers with min A OT Short Term Goal 2 (Week 1): Pt will complete shower transfers with min A OT Short Term Goal 3 (Week 1): Pt will dress LB with min A OT Short Term Goal 4 (Week 1): Pt will bathe 10/10 body parts with steadying assist OT Short Term Goal 5 (Week 1): Pt will maintain functional static standing balance with min A to complete grooming task  Skilled Therapeutic Interventions/Progress Updates:    PT seen for ADL bathing and dressing session. Pt at nurses station sitting in w/c upon arrival. He attempted to self-propel w/c to gym, however, experienced diffculty obtaining functional grasp around w/c rim to propel self successfully. Theraband placed around w/c rim to increase grip for successful propulsion. At end of session, pt demonstrated increase success with self-propulsion of w/c with theraband in place. Pt declined full showering task, stating that he showered with nursing last night. Verified by NT. Pt completed grooming task seated in w/c at the sink with supervision and cues for safety. He required VCs to problem solve various techniques to twist off tight toothpaste cap, eventually doing so with increased time. Pt dressed UB with set-up and required physical assist and VCs for clothing management to thread R LE into pants following successful threading of L LE. Pt stood at sink with steadying assist to pull pants up. Pt then taken to therapy gym in w/c with total A for time. Pt completed fine motor table top task seated in w/c. Pt threaded beads onto string, having increased success with using the L hand to stabilize vs. Using L hand to thread. Pt completed task alternating B  UEs as stabilizer. Pt then completed ball toss activity from w/c level. Pt with decreased dynamic sitting balance, requiring sides of w/c to maintain balance. He is with decreased insight into deficits and is impulsive with all mobility including seated tasks. Pt voiced his interest in playing softball, and was agreeable to hitting large ball with plastic bat. With VCs for safety, pt able to succesfuly make contact with ball ~90% of time. Safety belt donned for safety during all w/c level activities. At end of session, pt self-propeled w/c back to nurses station with min A for management of w/c. Pt left at nurses station.      Therapy Documentation Precautions:  Precautions Precautions: Fall Restrictions Weight Bearing Restrictions: No Pain: Pain Assessment Pain Assessment: No/denies pain  See FIM for current functional status  Therapy/Group: Individual Therapy  [name], Talma Aguillard C 04/23/2014, 10:11 AM

## 2014-04-23 NOTE — Care Management Note (Signed)
[facility] Individual Statement of Services  Patient Name:  Hayden Mckinney  Date:  04/23/2014  Welcome to the [facility].  Our goal is to provide you with an individualized program based on your diagnosis and situation, designed to meet your specific needs.  With this comprehensive rehabilitation program, you will be expected to participate in at least 3 hours of rehabilitation therapies Monday-Friday, with modified therapy programming on the weekends.  Your rehabilitation program will include the following services:  Physical Therapy (PT), Occupational Therapy (OT), Speech Therapy (ST), 24 hour per day rehabilitation nursing, Therapeutic Recreaction (TR), Neuropsychology, Case Management (Social Worker), Rehabilitation Medicine, Nutrition Services and Pharmacy Services  Weekly team conferences will be held on Tuesdays to discuss your progress.  Your Social Worker will talk with you frequently to get your input and to update you on team discussions.  Team conferences with you and your family in attendance may also be held.  Expected length of stay: 2 weeks  Overall anticipated outcome: minimal assistance  Depending on your progress and recovery, your program may change. Your Social Worker will coordinate services and will keep you informed of any changes. Your Social Worker's name and contact numbers are listed  below.  The following services may also be recommended but are not provided by the [facility]:    [place] will be made to provide these services after discharge if needed.  Arrangements include referral to agencies that provide these services.  Your insurance has been verified to be:  Medicare and Medicaid Your primary doctor is:  None  Pertinent information will be shared with your doctor and your insurance company.  Social Worker:   [place], [place] or ([phone]   Information discussed with and copy given to patient by: Lennart Pall, 04/23/2014, 2:20 PM

## 2014-04-23 NOTE — Telephone Encounter (Signed)
Called pt again still no answer. Pt hasn't seen Dr. Doug Sou since 2013 checking to see if he wanted to re-established...Hayden Mckinney

## 2014-04-23 NOTE — Progress Notes (Signed)
[facility] PHYSICAL MEDICINE & REHABILITATION     PROGRESS NOTE    Subjective/Complaints: Urine retention this am. Urine with odor. Pt denies nausea, vomiting, abdominal pain, diarrhea, chest pain, shortness of breath, palpitations, dizziness   Objective: Vital Signs: Blood pressure 143/58, pulse 71, temperature 98.6 F (37 C), temperature source Oral, resp. rate 18, weight 64.774 kg (142 lb 12.8 oz), SpO2 96 %. Dg Swallowing Func-speech Pathology  04/21/2014    Objective Swallowing Evaluation:    Patient Details  Name: Hayden Mckinney MRN: [id number] Date of Birth: [birth year]  Today's Date: 04/21/2014 Time: SLP Start Time (ACUTE ONLY): 1210-SLP Stop Time (ACUTE ONLY): 1225 SLP Time Calculation (min) (ACUTE ONLY): 15 min  Past Medical History:  Past Medical History  Diagnosis Date  . Type II diabetes mellitus   . Sarcoma     a. R leg  . Diabetic peripheral neuropathy     a. both feet  . TIA (transient ischemic attack)     a. 08/2010.  Marland Kitchen Hyperlipidemia   . Hypertension   . Stroke     a. 04/2014 multiple bateral infarcts, presumed to be embolic;  b. [id number]  Carotid U/S: 1-39% bilat ICA stenosis.  . H/O echocardiogram     a. 04/2014 Echo: EF 55-60%, no rwma.  Marland Kitchen ETOH abuse     a. 04/2014 18-24 beers q weekend.   Past Surgical History:  Past Surgical History  Procedure Laterality Date  . Sarcoma removal    . Repair of r hand after trauma    . Loop recorder implant N/A 04/20/2014    Procedure: LOOP RECORDER IMPLANT;  Surgeon: Thompson Grayer, MD;  Location:  [facility] CATH LAB;  Service: Cardiovascular;  Laterality: N/A;  . Tee without cardioversion N/A 04/20/2014    Procedure: TRANSESOPHAGEAL ECHOCARDIOGRAM (TEE);  Surgeon: Larey Dresser, MD;  Location: [facility] ENDOSCOPY;  Service: Cardiovascular;   Laterality: N/A;   HPI:  HPI: 69 y.o. male, with past medical history significant for hypertension  and diabetes and TIAs in the past presenting with 2 weeks history of  increasing weakness, falls and slurred speech that was  noted today by his  sister who was visiting him. It was difficult to get history from the  patient due to his speech. Patient denies any history of chest pains  shortness of breath nausea vomiting or headaches. Patient lives with a  roommate at home.  No Data Recorded  Assessment / Plan / Recommendation CHL IP CLINICAL IMPRESSIONS 04/21/2014  Dysphagia Diagnosis Mild pharyngeal phase dysphagia;Mild oral phase  dysphagia;Moderate oral phase dysphagia   Clinical impression Pt has a mild-moderate oropharyngeal dysphagia with  decreased bolus containment and cohesion secondary to lingual weakness and  decreased sensation per pt report. Oral phase overall is not significantly  prolonged with small bites, considering that pt is edentulous. Pt has a  delayed pharyngeal trigger with liquids, with adequate containment in the  valleculae before the swallow with nectar thick liquids. He has some  spillage to the pyriform sinsues with thin liquids, with frequent flash  penetration and ultimately sensed aspiration. Given the above as well as  concern for new aspiration PNA, recommend Dys 3 diet and nectar thick  liquids with continued SLP f/u to determine readiness to advance as oral  control improves.      CHL IP TREATMENT RECOMMENDATION 04/21/2014  Treatment Plan Recommendations Therapy as outlined in treatment plan below      CHL IP DIET RECOMMENDATION 04/21/2014  Diet Recommendations Dysphagia  3 (Mechanical Soft);Nectar-thick liquid  Liquid Administration via Cup;Straw  Medication Administration Whole meds with puree  Compensations Slow rate;Small sips/bites  Postural Changes and/or Swallow Maneuvers Seated upright 90  degrees;Upright 30-60 min after meal     CHL IP OTHER RECOMMENDATIONS 04/21/2014  Recommended Consults (None)  Oral Care Recommendations Oral care BID  Other Recommendations Order thickener from pharmacy;Prohibited food  (jello, ice cream, thin soups);Remove water pitcher     CHL IP FOLLOW UP RECOMMENDATIONS  04/21/2014  Follow up Recommendations Inpatient Rehab     CHL IP FREQUENCY AND DURATION 04/21/2014  Speech Therapy Frequency (ACUTE ONLY) min 2x/week  Treatment Duration 2 weeks     Pertinent Vitals/Pain: n/a     SLP Swallow Goals  CHL IP REASON FOR REFERRAL 04/21/2014  Reason for Referral Objectively evaluate swallowing function     CHL IP ORAL PHASE 04/21/2014  Lips (None)  Tongue (None)  Mucous membranes (None)  Nutritional status (None)  Other (None)  Oxygen therapy (None)  Oral Phase Impaired  Oral - Pudding Teaspoon (None)  Oral - Pudding Cup (None)  Oral - Honey Teaspoon (None)  Oral - Honey Cup (None)  Oral - Honey Syringe (None)  Oral - Nectar Teaspoon (None)  Oral - Nectar Cup (None)  Oral - Nectar Straw Weak lingual manipulation;Lingual/palatal residue  Oral - Nectar Syringe (None)  Oral - Ice Chips (None)  Oral - Thin Teaspoon (None)  Oral - Thin Cup Weak lingual manipulation  Oral - Thin Straw Weak lingual manipulation  Oral - Thin Syringe (None)  Oral - Puree Weak lingual manipulation;Lingual/palatal residue  Oral - Mechanical Soft Weak lingual manipulation;Lingual/palatal residue  Oral - Regular (None)  Oral - Multi-consistency (None)  Oral - Pill Weak lingual manipulation  Oral Phase - Comment (None)      CHL IP PHARYNGEAL PHASE 04/21/2014  Pharyngeal Phase Impaired  Pharyngeal - Pudding Teaspoon (None)  Penetration/Aspiration details (pudding teaspoon) (None)  Pharyngeal - Pudding Cup (None)  Penetration/Aspiration details (pudding cup) (None)  Pharyngeal - Honey Teaspoon (None)  Penetration/Aspiration details (honey teaspoon) (None)  Pharyngeal - Honey Cup (None)  Penetration/Aspiration details (honey cup) (None)  Pharyngeal - Honey Syringe (None)  Penetration/Aspiration details (honey syringe) (None)  Pharyngeal - Nectar Teaspoon (None)  Penetration/Aspiration details (nectar teaspoon) (None)  Pharyngeal - Nectar Cup (None)  Penetration/Aspiration details (nectar cup) (None)  Pharyngeal - Nectar Straw  Delayed swallow initiation  Penetration/Aspiration details (nectar straw) (None)  Pharyngeal - Nectar Syringe (None)  Penetration/Aspiration details (nectar syringe) (None)  Pharyngeal - Ice Chips (None)  Penetration/Aspiration details (ice chips) (None)  Pharyngeal - Thin Teaspoon (None)  Penetration/Aspiration details (thin teaspoon) (None)  Pharyngeal - Thin Cup Delayed swallow initiation;Penetration/Aspiration  during swallow;Premature spillage to pyriform sinuses  Penetration/Aspiration details (thin cup) Material enters airway, remains  ABOVE vocal cords then ejected out  Pharyngeal - Thin Straw Delayed swallow initiation;Premature spillage to  pyriform sinuses;Penetration/Aspiration before swallow  Penetration/Aspiration details (thin straw) Material enters airway, passes  BELOW cords and not ejected out despite cough attempt by patient  Pharyngeal - Thin Syringe (None)  Penetration/Aspiration details (thin syringe') (None)  Pharyngeal - Puree WFL  Penetration/Aspiration details (puree) (None)  Pharyngeal - Mechanical Soft WFL  Penetration/Aspiration details (mechanical soft) (None)  Pharyngeal - Regular (None)  Penetration/Aspiration details (regular) (None)  Pharyngeal - Multi-consistency (None)  Penetration/Aspiration details (multi-consistency) (None)  Pharyngeal - Pill WFL  Penetration/Aspiration details (pill) (None)  Pharyngeal Comment (None)     CHL IP CERVICAL ESOPHAGEAL PHASE 04/21/2014  Cervical Esophageal Phase WFL  Pudding Teaspoon (None)  Pudding Cup (None)  Honey Teaspoon (None)  Honey Cup (None)  Honey Syringe (None)  Nectar Teaspoon (None)  Nectar Cup (None)  Nectar Straw (None)  Nectar Syringe (None)  Thin Teaspoon (None)  Thin Cup (None)  Thin Straw (None)  Thin Syringe (None)  Cervical Esophageal Comment (None)         [name], M.A. CCC-SLP [phone]  [name] 04/21/2014, 2:01 PM     Recent Labs  04/21/14 1715 04/22/14 0639  WBC 6.0 5.4  HGB 11.1* 10.2*  HCT  33.8* 31.2*  PLT 231 189    Recent Labs  04/21/14 0434 04/21/14 1715  NA 143  --   K 3.7  --   CL 115*  --   GLUCOSE 148*  --   BUN 25*  --   CREATININE 1.54* 1.53*  CALCIUM 8.3*  --    CBG (last 3)   Recent Labs  04/22/14 2110 04/22/14 2306 04/23/14 0649  GLUCAP 170* 186* 154*    Wt Readings from Last 3 Encounters:  04/21/14 64.774 kg (142 lb 12.8 oz)  04/16/14 67.1 kg (147 lb 14.9 oz)  12/14/11 76.613 kg (168 lb 14.4 oz)    Physical Exam:   Gen:no acute distress,  HENT:edentulous. Oral mucosa pink and moist Head: Normocephalic.  Eyes: EOM are normal.  Neck: Normal range of motion. Neck supple. No thyromegaly present.  Cardiovascular: Normal rate and regular rhythm.no murmurs or rubs  Respiratory: Effort normal and breath sounds normal. No respiratory distress. no wheezes GI: Soft. Bowel sounds are normal. He exhibits no distension. Non-tender Neurological: He is alert.  Speech is  dysarthric but fairly intelligibile. He is able to provide his name and age as well as date of birth. Followed simple commands. Right limb ataxia, right PD. RUE motor 4 to 4+/5 delt,bic,tri,wrist, hand. RLE: 4/5 hf,ke. 0 to trace ADF and 3-4/5 APF. Sensation intact to pain on right side Skin: Skin is warm and dry.  Psych: pt pleasant and appropriate  Assessment/Plan: 1. 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 Physiatrist is providing close team supervision and 24 hour management of active medical problems listed below. Physiatrist and rehab team continue to assess barriers to discharge/monitor patient progress toward functional and medical goals. FIM: FIM - Bathing Bathing Steps Patient Completed: Chest, Right Arm, Left Arm, Buttocks, Front perineal area, Abdomen, Right upper leg, Left upper leg, Right lower leg  (including foot), Left lower leg (including foot) Bathing: 4: Steadying assist  FIM - Upper Body Dressing/Undressing Upper body dressing/undressing steps patient completed: Thread/unthread right sleeve of pullover shirt/dresss, Thread/unthread left sleeve of pullover shirt/dress, Put head through opening of pull over shirt/dress, Pull shirt over trunk Upper body dressing/undressing: 5: Set-up assist to: Obtain clothing/put away FIM - Lower Body Dressing/Undressing Lower body dressing/undressing steps patient completed: Don/Doff left sock, Don/Doff right sock, Pull pants up/down, Thread/unthread right pants leg Lower body dressing/undressing: 2: Max-Patient completed 25-49% of tasks  FIM - Toileting Toileting steps completed by patient: Performs perineal hygiene Toileting Assistive Devices: Grab bar or rail for support Toileting: 2: Max-Patient completed 1 of 3 steps  FIM - Radio producer Devices: Grab bars Toilet Transfers: 2-From toilet/BSC: Max A (lift and lower assist), 2-To toilet/BSC: Max A (lift and lower assist)  FIM - Engineer, site Assistive Devices: Arm rests  Bed/Chair Transfer: 2: Chair or W/C > Bed: Max A (lift and lower assist), 2: Bed > Chair or W/C: Max A (lift and lower assist), 4: Supine > Sit: Min A (steadying Pt. > 75%/lift 1 leg)  FIM - Locomotion: Wheelchair Locomotion: Wheelchair: 2: Travels 50 - 149 ft with minimal assistance (Pt.>75%) FIM - Locomotion: Ambulation Ambulation/Gait Assistance: 3: Mod assist, 1: +2 Total assist Locomotion: Ambulation: 1: Two helpers  Comprehension Comprehension Mode: Auditory Comprehension: 5-Understands basic 90% of the time/requires cueing < 10% of the time  Expression Expression Mode: Verbal Expression: 3-Expresses basic 50 - 74% of the time/requires cueing 25 - 50% of the time. Needs to repeat parts of sentences.  Social Interaction Social Interaction: 4-Interacts  appropriately 75 - 89% of the time - Needs redirection for appropriate language or to initiate interaction.  Problem Solving Problem Solving: 3-Solves basic 50 - 74% of the time/requires cueing 25 - 49% of the time  Memory Memory: 3-Recognizes or recalls 50 - 74% of the time/requires cueing 25 - 49% of the time  Medical Problem List and Plan: 1. 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 DVT Prophylaxis/Anticoagulation: Subcutaneous Lovenox. Monitor platelet counts and any signs of bleeding. Venous Dopplers negative 3. Pain Management: Lyrica 50 mg twice a day Tylenol as needed 4. Hypertension. Coreg 6.25 mg twice a day,BIDIL 20-30 7.5 mg 3 times a day. Monitor with increased mobility  5. Neuropsych: This patient is capable of making decisions on his own behalf. 6. Skin/Wound Care: Routine skin checks  7. Fluids/Electrolytes/Nutrition: Strict I&O's with follow-up chemistries 8. Diabetes mellitus with peripheral neuropathy. Latest hemoglobin A1c 7.9. Presently with sliding scale insulin. Patient on Glucophage 1000 mg twice a day prior to admission. Resume at 500mg  bid today 9. Question left lower lobe pneumonia. Continue antibiotic therapy for now with Augmentin 6 days. Encourage incentive spirometry. Follow-up chest x-ray if indicated  -?need for abx 10. Acute on chronic renal insufficiency. Cr around 1.5  -urine retention---check ua/cx 11. Dysphagia. Diet changed to mechanical soft nectar liquids after modified barium swallow 04/21/2014.    -tolerating thus far   LOS (Days) 2 A FACE TO FACE EVALUATION WAS PERFORMED  Bailea Beed T 04/23/2014 8:18 AM

## 2014-04-23 NOTE — Progress Notes (Signed)
Occupational Therapy Session Note  Patient Details  Name: Hayden Mckinney MRN: [id number] Date of Birth: [birth year]  Today's Date: 04/23/2014 OT Individual Time: 1300-1330 OT Individual Time Calculation (min): 30 min    Short Term Goals: Week 1:  OT Short Term Goal 1 (Week 1): Pt will complete toilet transfers with min A OT Short Term Goal 2 (Week 1): Pt will complete shower transfers with min A OT Short Term Goal 3 (Week 1): Pt will dress LB with min A OT Short Term Goal 4 (Week 1): Pt will bathe 10/10 body parts with steadying assist OT Short Term Goal 5 (Week 1): Pt will maintain functional static standing balance with min A to complete grooming task  Skilled Therapeutic Interventions/Progress Updates:    Pt seen for OT session focusing on functional mobility, sitting balance, and function attention/ cognition. Pt in room upon arrival set-up in w/c with meal tray with NT supervising. Therapist supervised remainder of pt's feeding as he demonstrated functional use of B UEs to feed. Pt then taken to therapy gym toal A for time. Pt transferred w/c> therapy mat with mod A and cues for safety and sequencing of steps. Seated unsupported on mat, pt able to sort playing cards by suit in reasonable amount of time and with 100% accuracy. Pt then participated in game of [place] with therapist, demonstrating ability to calculate simple numbers in mildly stimulating environment with 100% accuracy. Pt then participated in ball toss activity seated on EOM. Pt required min steadying assist during activity and cues for safety. Pt impulsive with all mobility with decreased awareness of deficits or need for assist. Pt transferred back to w/c with mod A. He was returned to nurses station where he was left with safety belt donned.   Therapy Documentation Precautions:  Precautions Precautions: Fall Restrictions Weight Bearing Restrictions: No Pain: Pain Assessment Pain Assessment: No/denies pain  See  FIM for current functional status  Therapy/Group: Individual Therapy  [name], [name] 04/23/2014, 3:10 PM

## 2014-04-23 NOTE — Progress Notes (Signed)
Speech Language Pathology Daily Session Note  Patient Details  Name: Hayden Mckinney MRN: [id number] Date of Birth: 1945/11/06  Today's Date: 04/23/2014 SLP Individual Time: 0800-0900 SLP Individual Time Calculation (min): 60 min  Short Term Goals: Week 1: SLP Short Term Goal 1 (Week 1): Patient will consume least restrictive diet with Min A verbal and visual cues for use of swallow strategies. SLP Short Term Goal 2 (Week 1): Patient will consume trials of thin liquids with minimal overt s/s of aspiration in 90% of trials over 3 consecutive sessions. SLP Short Term Goal 3 (Week 1): Patient will increase speech intelligibility to ~75% at the phrase level with Mod A visual and verbal cues for use of intelligibility strategies. SLP Short Term Goal 4 (Week 1): Patient will demonstrate mildly complex problem-solving in functional tasks given Min A visual and verbal cues in 75% of observable instances. SLP Short Term Goal 5 (Week 1): Patient will utilize external aid to recall new, daily information, such as diet restrictions, with Min A verbal and visual cues over 3 consecutive sessions. SLP Short Term Goal 6 (Week 1): Patient will identify 2 new physical and 2 new cognitive deficits with Min A verbal and question cues over 3 consecutive sessions.  Skilled Therapeutic Interventions: Skilled treatment session focused on dysphagia and cognitive goals. Upon arrival, patient was supine in bed but agreeable to participate in treatment session.  SLP transferred patient to wheelchair with NT assistance to facilitate attention and safety for PO intake. Patient consumed breakfast meal of Dys. 3 textures with nectar-thick liquids without overt s/s of aspiration but required Max A verbal and tactile cues for utilization of swallowing compensatory strategies of small bites/sips and a slow-rate of self-feeding.  Patient demonstrated increased intelligibility at the phrase level to ~80% with Mod A verbal cues needed  for a slow rate and over-articulation.  SLP also facilitated session with basic medication management task, patient was able to independently recall 1 of 7 of his current medications and their function. An external aid will be initiated to increase recall and safety with medications. Patient verbalized he utilizes a pill box, however, patient will need to demonstrate appropriate organization with current medications.  Patient left at RN station with quick release belt in place. Continue with current plan of care.    FIM:  Comprehension Comprehension Mode: Auditory Comprehension: 5-Understands basic 90% of the time/requires cueing < 10% of the time Expression Expression Mode: Verbal Expression: 3-Expresses basic 50 - 74% of the time/requires cueing 25 - 50% of the time. Needs to repeat parts of sentences. Social Interaction Social Interaction: 4-Interacts appropriately 75 - 89% of the time - Needs redirection for appropriate language or to initiate interaction. Problem Solving Problem Solving: 3-Solves basic 50 - 74% of the time/requires cueing 25 - 49% of the time Memory Memory: 3-Recognizes or recalls 50 - 74% of the time/requires cueing 25 - 49% of the time FIM - Eating Eating Activity: 5: Supervision/cues  Pain Pain Assessment Pain Assessment: No/denies pain  Therapy/Group: Individual Therapy  Yvett Rossel 04/23/2014, 2:04 PM

## 2014-04-23 NOTE — Progress Notes (Signed)
Physical Therapy Session Note  Patient Details  Name: Hayden Mckinney MRN: [id number] Date of Birth: [birth year]  Today's Date: 04/23/2014 PT Individual Time: 1400-1500 PT Individual Time Calculation (min): 60 min   Short Term Goals: Week 1:  PT Short Term Goal 1 (Week 1): Pt will be able to transfer with min A for basic transfers PT Short Term Goal 2 (Week 1): Pt will be able to demonstrate dynamic standing balance with min A PT Short Term Goal 3 (Week 1): Pt will be able to gait with mod A x 50' PT Short Term Goal 4 (Week 1): Pt will be able to propel w/c x 100' with S around obstacles  Skilled Therapeutic Interventions/Progress Updates:    Therapeutic Activity: PT instructs pt in repeated sit to stands from w/c using armrests and tall mirror for visual feedback req min A progressing to CGA with single step cues for sequencing x 10 reps in blocked practice. Focus is on fully upright posture - pt req repeated verbal cues to tuck bottom and look at self in the mirror once in upright stand.   W/C Management: Pt received up in w/c at RN station with quick-release belt in place, agreeable to PT. PT instructs pt in w/c propulsion with B UEs x 75' + 100' req seated rest break - focus is on B UE coordination - PT notes reduces accuracy of end point and increased dysmetria with UE w/c propulsion.   Neuromuscular Reeducation: PT instructs pt in dynamic standing balance activity of placing horseshoes on the rim of a high play basketball hoop req min A. Horseshoes placed on rolling stools to R and to L - activity focuses on reaching down to medium heights to grab horseshoe and then transferring horseshoe to other hand for bimanual manipulaion to place horseshoe with one hand on bball rim: x 2 sets. On first set, pt compensates by leaning legs back against w/c; on 2nd set, PT instructs pt to take a step forward so no compensation of leaning with legs can be done and level of assist needed increases to  occasional mod A.  Pt reports he is thirst and so PT finds out that pt's diet consistency is nectar thick and checks with RN who reports it is ok to give pt nectar thick OJ with his diabetes. PT sets up pt's drink and he feeds himself with a spoon with full supervision.   Gait Training: PT instructs pt in ambulation with wall rail (R side, then L side) x 30' each time req min A from PT and 2nd assist for w/c follow.   Pt continues to be limited by ataxia and limited safety awareness. Pt participated well during PT and impulsiveness seems to be decreasing, but is still present. Pt ended at RN station with quick release belt in place. Continue per PT POC.   Therapy Documentation Precautions:  Precautions Precautions: Fall Restrictions Weight Bearing Restrictions: No   Pain: Pain Assessment Pain Assessment: No/denies pain   See FIM for current functional status  Therapy/Group: Individual Therapy  [name] M 04/23/2014, 12:51 PM

## 2014-04-23 NOTE — IPOC Note (Signed)
Overall Plan of Care [facility]) Patient Details Name: Hayden Mckinney MRN: [id number] DOB: 11-01-1945  Admitting Diagnosis: R CEREBELLAR AND [facility] INFARCTS  Hospital Problems: Principal Problem:   Cerebellar infarct Active Problems:   Type II diabetes mellitus with neurological manifestations     Functional Problem List: Nursing Bowel, Endurance, Medication Management, Motor, Nutrition, Safety, Sensory, Skin Integrity  PT Balance, Behavior, Endurance, Motor, Pain, Sensory, Safety, Skin Integrity  OT Balance, Behavior, Cognition, Endurance, Motor, Perception, Safety, Sensory  SLP Behavior, Cognition, Safety  TR         Basic ADL's: OT Grooming, Bathing, Dressing, Toileting     Advanced  ADL's: OT Simple Meal Preparation     Transfers: PT Bed Mobility, Bed to Chair, Car, Manufacturing systems engineer, Metallurgist: PT Ambulation, Emergency planning/management officer, Stairs     Additional Impairments: OT Fuctional Use of Upper Extremity  SLP Swallowing, Communication, Social Cognition expression Social Interaction, Problem Solving, Memory, Awareness  TR      Anticipated Outcomes Item Anticipated Outcome  Self Feeding Supervision  Swallowing  Supervision A   Basic self-care  Supervision  Toileting  Supervision   Bathroom Transfers Supervision  Bowel/Bladder  Mod I  Transfers  S to min A  Locomotion  min A gait; S w/c mobility  Communication  Min A  Cognition  Supervision A  Pain  < 3  Safety/Judgment  Supervision   Therapy Plan: PT Intensity: Minimum of 1-2 x/day ,45 to 90 minutes PT Frequency: 5 out of 7 days PT Duration Estimated Length of Stay: 12-14 days OT Intensity: Minimum of 1-2 x/day, 45 to 90 minutes OT Frequency: 5 out of 7 days OT Duration/Estimated Length of Stay: 10-12 days SLP Intensity: Minumum of 1-2 x/day, 30 to 90 minutes SLP Frequency: 3 to 5 out of 7 days SLP Duration/Estimated Length of Stay: 12-14 days       Team Interventions: Nursing  Interventions Patient/Family Education, Bowel Management, Disease Management/Prevention, Cognitive Remediation/Compensation, Skin Care/Wound Management, Medication Management, Dysphagia/Aspiration Precaution Training, Discharge Planning  PT interventions Ambulation/gait training, Balance/vestibular training, Cognitive remediation/compensation, Discharge planning, Disease management/prevention, DME/adaptive equipment instruction, Functional mobility training, Neuromuscular re-education, Pain management, Patient/family education, Skin care/wound management, Splinting/orthotics, Psychosocial support, Community reintegration, IT trainer, Therapeutic Activities, Therapeutic Exercise, UE/LE Strength taining/ROM, UE/LE Coordination activities, Wheelchair propulsion/positioning  OT Interventions Training and development officer, Cognitive remediation/compensation, Discharge planning, Community reintegration, Engineer, drilling, Functional mobility training, Neuromuscular re-education, Pain management, Patient/family education, Psychosocial support, Self Care/advanced ADL retraining, Therapeutic Activities, Therapeutic Exercise, UE/LE Strength taining/ROM, UE/LE Coordination activities  SLP Interventions Cognitive remediation/compensation, Cueing hierarchy, Dysphagia/aspiration precaution training, Environmental controls, Therapeutic Exercise, Therapeutic Activities, Patient/family education, Oral motor exercises, Functional tasks, Internal/external aids, Speech/Language facilitation  TR Interventions    SW/CM Interventions      Team Discharge Planning: Destination: PT-Home (sister's home) ,OT- Home , SLP-Home Projected Follow-up: PT-Home health PT, 24 hour supervision/assistance, OT-  Home health OT, 24 hour supervision/assistance, SLP-24 hour supervision/assistance, Home Health SLP Projected Equipment Needs: PT-Wheelchair (measurements), Wheelchair cushion (measurements) (AD for gait TBD), OT-  Tub/shower seat, To be determined, SLP-To be determined Equipment Details: PT- , OT-  Patient/family involved in discharge planning: PT- Patient,  OT- , SLP-Patient  MD ELOS: 12-14 days Medical Rehab Prognosis:  Excellent Assessment: The patient has been admitted for CIR therapies with the diagnosis of right cerebellar infarct. The team will be addressing functional mobility, strength, stamina, balance, safety, adaptive techniques and equipment, self-care, bowel and bladder mgt, patient and caregiver education, vestibular  rx/assessment, communication, speech, swallowing, ego support, NMR. Goals have been set at supervision for basic self care and ADL's and supervision to min assist with mobility.    Meredith Staggers, MD, FAAPMR      See Team Conference Notes for weekly updates to the plan of care

## 2014-04-24 ENCOUNTER — Inpatient Hospital Stay (HOSPITAL_COMMUNITY): Payer: Medicare Other | Admitting: Occupational Therapy

## 2014-04-24 ENCOUNTER — Inpatient Hospital Stay (HOSPITAL_COMMUNITY): Payer: Medicare Other | Admitting: Speech Pathology

## 2014-04-24 ENCOUNTER — Inpatient Hospital Stay (HOSPITAL_COMMUNITY): Payer: Medicare Other | Admitting: Physical Therapy

## 2014-04-24 LAB — GLUCOSE, CAPILLARY
GLUCOSE-CAPILLARY: 206 mg/dL — AB (ref 70–99)
Glucose-Capillary: 203 mg/dL — ABNORMAL HIGH (ref 70–99)
Glucose-Capillary: 158 mg/dL — ABNORMAL HIGH (ref 70–99)
Glucose-Capillary: 172 mg/dL — ABNORMAL HIGH (ref 70–99)

## 2014-04-24 LAB — URINE CULTURE
COLONY COUNT: NO GROWTH
Culture: NO GROWTH

## 2014-04-24 NOTE — Progress Notes (Signed)
[place] PHYSICAL MEDICINE & REHABILITATION     PROGRESS NOTE    Subjective/Complaints: Still with some urine retention. Slept well. Pt denies nausea, vomiting, abdominal pain, diarrhea, chest pain, shortness of breath, palpitations,    Objective: Vital Signs: Blood pressure 137/59, pulse 76, temperature 98 F (36.7 C), temperature source Oral, resp. rate 18, weight 64.774 kg (142 lb 12.8 oz), SpO2 96 %. No results found.  Recent Labs  04/21/14 1715 04/22/14 0639  WBC 6.0 5.4  HGB 11.1* 10.2*  HCT 33.8* 31.2*  PLT 231 189    Recent Labs  04/21/14 1715  CREATININE 1.53*   CBG (last 3)   Recent Labs  04/23/14 1621 04/23/14 2059 04/24/14 0705  GLUCAP 167* 226* 203*    Wt Readings from Last 3 Encounters:  04/21/14 64.774 kg (142 lb 12.8 oz)  04/16/14 67.1 kg (147 lb 14.9 oz)  12/14/11 76.613 kg (168 lb 14.4 oz)    Physical Exam:   Gen:no acute distress,  HENT:edentulous. Oral mucosa pink and moist Head: Normocephalic.  Eyes: EOM are normal.  Neck: Normal range of motion. Neck supple. No thyromegaly present.  Cardiovascular: Normal rate and regular rhythm.no murmurs or rubs  Respiratory: Effort normal and breath sounds normal. No respiratory distress. no wheezes GI: Soft. Bowel sounds are normal. He exhibits no distension. Non-tender Neurological: He is alert.  Speech is  dysarthric but fairly intelligibile. He is able to provide his name and age as well as date of birth. Followed simple commands. Right limb ataxia, right PD. RUE motor 4 to 4+/5 delt,bic,tri,wrist, hand. RLE: 4/5 hf,ke. 0 to trace ADF and 3-4/5 APF. Sensation intact to pain on right side Skin: Skin is warm and dry.  Psych: pt pleasant and appropriate  Assessment/Plan: 1. 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 Physiatrist  is providing close team supervision and 24 hour management of active medical problems listed below. Physiatrist and rehab team continue to assess barriers to discharge/monitor patient progress toward functional and medical goals. FIM: FIM - Bathing Bathing Steps Patient Completed: Chest, Right Arm, Left Arm, Buttocks, Front perineal area, Abdomen, Right upper leg, Left upper leg, Right lower leg (including foot), Left lower leg (including foot) Bathing: 4: Steadying assist  FIM - Upper Body Dressing/Undressing Upper body dressing/undressing steps patient completed: Thread/unthread right sleeve of pullover shirt/dresss, Thread/unthread left sleeve of pullover shirt/dress, Put head through opening of pull over shirt/dress, Pull shirt over trunk Upper body dressing/undressing: 5: Set-up assist to: Obtain clothing/put away FIM - Lower Body Dressing/Undressing Lower body dressing/undressing steps patient completed: Pull pants up/down, Thread/unthread left pants leg Lower body dressing/undressing: 4: Min-Patient completed 75 plus % of tasks  FIM - Toileting Toileting steps completed by patient: Performs perineal hygiene Toileting Assistive Devices: Grab bar or rail for support Toileting: 2: Max-Patient completed 1 of 3 steps  FIM - Radio producer Devices: Grab bars Toilet Transfers: 2-From toilet/BSC: Max A (lift and lower assist), 2-To toilet/BSC: Max A (lift and lower assist)  FIM - Control and instrumentation engineer Devices: Arm rests Bed/Chair Transfer: 3: Chair or W/C > Bed: Mod A (lift or lower assist)  FIM - Locomotion: Wheelchair Distance: 100 Locomotion: Wheelchair: 2: Travels 50 - 149 ft with minimal assistance (Pt.>75%) FIM - Locomotion: Ambulation Locomotion: Ambulation Assistive Devices: Other (comment) (wall rail) Ambulation/Gait Assistance: 1: +2 Total assist (min-mod A from PT; 2nd  assist w/c follow) Locomotion: Ambulation: 1: Two  helpers  Comprehension Comprehension Mode: Auditory Comprehension: 5-Understands basic 90% of the time/requires cueing < 10% of the time  Expression Expression Mode: Verbal Expression: 3-Expresses basic 50 - 74% of the time/requires cueing 25 - 50% of the time. Needs to repeat parts of sentences.  Social Interaction Social Interaction: 4-Interacts appropriately 75 - 89% of the time - Needs redirection for appropriate language or to initiate interaction.  Problem Solving Problem Solving: 3-Solves basic 50 - 74% of the time/requires cueing 25 - 49% of the time  Memory Memory: 3-Recognizes or recalls 50 - 74% of the time/requires cueing 25 - 49% of the time  Medical Problem List and Plan: 1. 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 DVT Prophylaxis/Anticoagulation: Subcutaneous Lovenox. Monitor platelet counts and any signs of bleeding. Venous Dopplers negative 3. Pain Management: Lyrica 50 mg twice a day Tylenol as needed 4. Hypertension. Coreg 6.25 mg twice a day,BIDIL 20-30 7.5 mg 3 times a day. Monitor with increased mobility  5. Neuropsych: This patient is capable of making decisions on his own behalf. 6. Skin/Wound Care: Routine skin checks  7. Fluids/Electrolytes/Nutrition: encourage adequate po 8. Diabetes mellitus with peripheral neuropathy. Latest hemoglobin A1c 7.9. Presently with sliding scale insulin. Patient on Glucophage 1000 mg twice a day prior to admission. Resumed at 500mg  bid---observe 9. Question left lower lobe pneumonia. Continue antibiotic therapy for now with Augmentin 6 days. Encourage incentive spirometry. Follow-up chest x-ray if indicated  -?need for abx 10. Acute on chronic renal insufficiency. Cr around 1.5  -urine retention---ua neg, culture pending  -prn caths 11. Dysphagia. Diet changed to mechanical soft nectar liquids after modified barium swallow 04/21/2014.    -tolerating thus far   LOS  (Days) 3 A FACE TO FACE EVALUATION WAS PERFORMED  Kinzlee Selvy T 04/24/2014 8:41 AM

## 2014-04-24 NOTE — Progress Notes (Signed)
Occupational Therapy Session Note  Patient Details  Name: Hayden Mckinney MRN: [id number] Date of Birth: 02-Sep-1945  Today's Date: 04/24/2014 OT Individual Time: 0900-1000 OT Individual Time Calculation (min): 60 min    Skilled Therapeutic Interventions/Progress Updates: left lean after activity and fatigue from self care mobility and functional standing; also focused on LE stailization while standing for self care and transfers as well as UE motor control in both extremities;   Patient continuing to work on verbal articulation during speaking so that others will understanding his communication and needs     Therapy Documentation Precautions:  Precautions Precautions: Fall Restrictions Weight Bearing Restrictions: No Pain: denied    See FIM for current functional status  Therapy/Group: Individual Therapy  Alfredia Ferguson [facility] 04/24/2014, 9:40 AM

## 2014-04-24 NOTE — Progress Notes (Signed)
Speech Language Pathology Daily Session Note  Patient Details  Name: Hayden Mckinney MRN: [id number] Date of Birth: 1945/07/09  Today's Date: 04/24/2014 SLP Individual Time: 1000-1030 SLP Individual Time Calculation (min): 30 min  Short Term Goals: Week 1: SLP Short Term Goal 1 (Week 1): Patient will consume least restrictive diet with Min A verbal and visual cues for use of swallow strategies. SLP Short Term Goal 2 (Week 1): Patient will consume trials of thin liquids with minimal overt s/s of aspiration in 90% of trials over 3 consecutive sessions. SLP Short Term Goal 3 (Week 1): Patient will increase speech intelligibility to ~75% at the phrase level with Mod A visual and verbal cues for use of intelligibility strategies. SLP Short Term Goal 4 (Week 1): Patient will demonstrate mildly complex problem-solving in functional tasks given Min A visual and verbal cues in 75% of observable instances. SLP Short Term Goal 5 (Week 1): Patient will utilize external aid to recall new, daily information, such as diet restrictions, with Min A verbal and visual cues over 3 consecutive sessions. SLP Short Term Goal 6 (Week 1): Patient will identify 2 new physical and 2 new cognitive deficits with Min A verbal and question cues over 3 consecutive sessions.  Skilled Therapeutic Interventions: Skilled treatment session focused on speech goals. SLP facilitated session by providing Min A verbal cues for use of speech intelligibility strategies in regards to a slow rate of speech and increased vocal intensity. Patient was ~75% intelligible at the phrase and sentence level during a verbal description task. Patient left in bed with bed alarm on and all needs within reach. Continue with current plan of care.    FIM:  Comprehension Comprehension Mode: Auditory Comprehension: 5-Understands basic 90% of the time/requires cueing < 10% of the time Expression Expression Mode: Verbal Expression: 4-Expresses basic 75 -  89% of the time/requires cueing 10 - 24% of the time. Needs helper to occlude trach/needs to repeat words. Social Interaction Social Interaction: 4-Interacts appropriately 75 - 89% of the time - Needs redirection for appropriate language or to initiate interaction. Problem Solving Problem Solving: 3-Solves basic 50 - 74% of the time/requires cueing 25 - 49% of the time Memory Memory: 3-Recognizes or recalls 50 - 74% of the time/requires cueing 25 - 49% of the time FIM - Eating Eating Activity: 5: Supervision/cues;6: Modified consistency diet: (comment) (nectar thick)  Pain Pain Assessment Pain Assessment: No/denies pain  Therapy/Group: Individual Therapy  Ophie Burrowes 04/24/2014, 12:43 PM

## 2014-04-24 NOTE — Progress Notes (Signed)
Occupational Therapy Session Note  Patient Details  Name: AMMAN HENRICH MRN: [id number] Date of Birth: Jul 28, 1945  Today's Date: 04/24/2014 OT Individual Time: 1300-1400 OT Individual Time Calculation (min): 60 min    Skilled Therapeutic Interventions/Progress Updates: Patient completed standing activities for LE motor stabilization and proprioceptive feedback; bilateral UE weight bearing in standing; Bilateral UE digital coordination and fine motor skills; self propulsion of w/c (S)     Therapy Documentation Precautions:  Precautions Precautions: Fall Restrictions Weight Bearing Restrictions: No  Pain: denied    See FIM for current functional status  Therapy/Group: Individual Therapy  Alfredia Ferguson [facility] 04/24/2014, 4:58 PM

## 2014-04-24 NOTE — Progress Notes (Signed)
Physical Therapy Session Note  Patient Details  Name: Hayden Mckinney MRN: [id number] Date of Birth: 10-04-1945  Today's Date: 04/24/2014 PT Individual Time: 1115-1200 PT Individual Time Calculation (min): 45 min   Short Term Goals: Week 1:  PT Short Term Goal 1 (Week 1): Pt will be able to transfer with min A for basic transfers PT Short Term Goal 2 (Week 1): Pt will be able to demonstrate dynamic standing balance with min A PT Short Term Goal 3 (Week 1): Pt will be able to gait with mod A x 50' PT Short Term Goal 4 (Week 1): Pt will be able to propel w/c x 100' with S around obstacles Skilled Therapeutic Interventions/Progress Updates:   Pt demonstrates improvement requiring decreased assist for gait activities. Pt does best with cues for posture and and step length. Transfers limited by decreased anterior weight shift that improves with cueing. Pt benefits from Pt would continue to benefit from individualized skilled PT services to increase functional mobility.  Therapy Documentation Precautions:  Precautions Precautions: Fall Restrictions Weight Bearing Restrictions: No Pain: Pain Assessment Pain Assessment: No/denies pain Mobility:  Pt performs transfers Mod A Locomotion : Ambulation Ambulation/Gait Assistance: 3: Mod assist (100') with RW with cues for safety, attention, posture, step length Other Treatments:  Pt performs standing weight shifts, Heel raises, BLE advancement with core facilitation with anterior/posterior/lateral therapist hand placements, transfers 2x10 B/L. Pt performs static standing 1'x4 with facilitation of neutral spine posture, BUE manipulation in unsupported sitting. Pt educated on rehab plan and safety in mobility. Free gait 25'x2 without AD Max A. See FIM for current functional status  Therapy/Group: Individual Therapy  Monia Pouch 04/24/2014, 11:28 AM

## 2014-04-25 ENCOUNTER — Inpatient Hospital Stay (HOSPITAL_COMMUNITY): Payer: Medicare Other

## 2014-04-25 DIAGNOSIS — N319 Neuromuscular dysfunction of bladder, unspecified: Secondary | ICD-10-CM

## 2014-04-25 LAB — GLUCOSE, CAPILLARY
GLUCOSE-CAPILLARY: 140 mg/dL — AB (ref 70–99)
GLUCOSE-CAPILLARY: 162 mg/dL — AB (ref 70–99)
Glucose-Capillary: 164 mg/dL — ABNORMAL HIGH (ref 70–99)
Glucose-Capillary: 240 mg/dL — ABNORMAL HIGH (ref 70–99)

## 2014-04-25 MED ORDER — METFORMIN HCL 500 MG PO TABS: 500.0000 mg | ORAL_TABLET | Freq: Two times a day (BID) | ORAL | Status: DC

## 2014-04-25 MED ORDER — TAMSULOSIN HCL 0.4 MG PO CAPS
0.4000 mg | ORAL_CAPSULE | Freq: Every day | ORAL | Status: DC
  Administered 2014-04-25 – 2014-05-03 (×9): 0.4 mg via ORAL
  Filled 2014-04-25 (×10): qty 1

## 2014-04-25 NOTE — Progress Notes (Addendum)
[place] PHYSICAL MEDICINE & REHABILITATION     PROGRESS NOTE    Subjective/Complaints: Had a reasonable night but still not emptying.  Pt denies nausea, vomiting, abdominal pain, diarrhea, chest pain, shortness of breath, palpitations,    Objective: Vital Signs: Blood pressure 138/63, pulse 77, temperature 98.8 F (37.1 C), temperature source Oral, resp. rate 18, weight 64.774 kg (142 lb 12.8 oz), SpO2 96 %. No results found. No results for input(s): WBC, HGB, HCT, PLT in the last 72 hours. No results for input(s): NA, K, CL, GLUCOSE, BUN, CREATININE, CALCIUM in the last 72 hours.  Invalid input(s): CO CBG (last 3)   Recent Labs  04/24/14 1657 04/24/14 2045 04/25/14 0645  GLUCAP 172* 206* 140*    Wt Readings from Last 3 Encounters:  04/21/14 64.774 kg (142 lb 12.8 oz)  04/16/14 67.1 kg (147 lb 14.9 oz)  12/14/11 76.613 kg (168 lb 14.4 oz)    Physical Exam:   Gen:no acute distress,  HENT:edentulous. Oral mucosa pink and moist Head: Normocephalic.  Eyes: EOM are normal.  Neck: Normal range of motion. Neck supple. No thyromegaly present.  Cardiovascular: Normal rate and regular rhythm.no murmurs or rubs  Respiratory: Effort normal and breath sounds normal. No respiratory distress. no wheezes GI: Soft. Bowel sounds are normal. He exhibits no distension. Non-tender Neurological: He is alert.  Speech is  dysarthric but fairly intelligibile. He is able to provide his name and age as well as date of birth. Followed simple commands. Right limb ataxia, right PD. RUE motor 4 to 4+/5 delt,bic,tri,wrist, hand. RLE: 4/5 hf,ke. 0 to trace ADF and 3-4/5 APF. Sensation intact to pain on right side Skin: Skin is warm and dry.  Psych: pt pleasant and appropriate  Assessment/Plan: 1. 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 Physiatrist is providing close team supervision and 24 hour management of active medical problems listed below. Physiatrist and rehab team continue to assess barriers to discharge/monitor patient progress toward functional and medical goals. FIM: FIM - Bathing Bathing Steps Patient Completed: Chest, Right Arm, Left Arm, Abdomen, Front perineal area, Right upper leg, Left upper leg, Left lower leg (including foot) Bathing: 4: Min-Patient completes 8-9 70f 10 parts or 75+ percent  FIM - Upper Body Dressing/Undressing Upper body dressing/undressing steps patient completed: Pull shirt over trunk, Thread/unthread right sleeve of pullover shirt/dresss, Thread/unthread left sleeve of pullover shirt/dress, Put head through opening of pull over shirt/dress Upper body dressing/undressing: 5: Set-up assist to: Obtain clothing/put away FIM - Lower Body Dressing/Undressing Lower body dressing/undressing steps patient completed: Pull pants up/down, Thread/unthread left pants leg Lower body dressing/undressing: 0: Wears gown/pajamas-no public clothing (had no clean pants)  FIM - Toileting Toileting steps completed by patient: Adjust clothing prior to toileting, Performs perineal hygiene, Adjust clothing after toileting Toileting Assistive Devices: Grab bar or rail for support Toileting: 4: Steadying assist  FIM - Radio producer Devices: Grab bars Toilet Transfers: 4-From toilet/BSC: Min A (steadying Pt. > 75%), 4-To toilet/BSC: Min A (steadying Pt. > 75%)  FIM - Bed/Chair Transfer Bed/Chair Transfer Assistive Devices: Arm rests Bed/Chair Transfer: 3: Chair or W/C > Bed: Mod A (lift or lower assist)  FIM - Locomotion: Wheelchair Distance: 100 Locomotion: Wheelchair: 2: Travels 50 - 149 ft with minimal assistance (Pt.>75%) FIM - Locomotion: Ambulation Locomotion: Ambulation Assistive Devices: Other (comment) (wall rail) Ambulation/Gait Assistance: 3: Mod assist  (  100') Locomotion: Ambulation: 2: Travels 50 - 149 ft with minimal assistance (Pt.>75%)  Comprehension Comprehension Mode: Auditory Comprehension: 5-Understands basic 90% of the time/requires cueing < 10% of the time  Expression Expression Mode: Verbal Expression: 4-Expresses basic 75 - 89% of the time/requires cueing 10 - 24% of the time. Needs helper to occlude trach/needs to repeat words.  Social Interaction Social Interaction: 4-Interacts appropriately 75 - 89% of the time - Needs redirection for appropriate language or to initiate interaction.  Problem Solving Problem Solving: 3-Solves basic 50 - 74% of the time/requires cueing 25 - 49% of the time  Memory Memory: 3-Recognizes or recalls 50 - 74% of the time/requires cueing 25 - 49% of the time  Medical Problem List and Plan: 1. 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 DVT Prophylaxis/Anticoagulation: Subcutaneous Lovenox. Monitor platelet counts and any signs of bleeding. Venous Dopplers negative 3. Pain Management: Lyrica 50 mg twice a day Tylenol as needed 4. Hypertension. Coreg 6.25 mg twice a day,BIDIL 20-30 7.5 mg 3 times a day. Monitor with increased mobility  5. Neuropsych: This patient is capable of making decisions on his own behalf. 6. Skin/Wound Care: Routine skin checks  7. Fluids/Electrolytes/Nutrition: encourage adequate po 8. Diabetes mellitus with peripheral neuropathy. Latest hemoglobin A1c 7.9. Presently with sliding scale insulin. Patient on Glucophage 1000 mg twice a day prior to admission. Resume at 500mg  bid--- (not started before) 9. Question left lower lobe pneumonia. Continue antibiotic therapy for now with Augmentin 6 days. Encourage incentive spirometry. Follow-up chest x-ray if indicated  -?need for abx 10. Acute on chronic renal insufficiency. Cr around 1.5  -neurogenic bladder/urine retention---ua neg, culture negative  -add flomax  -prn  caths 11. Dysphagia. Diet changed to mechanical soft nectar liquids after modified barium swallow 04/21/2014.    -tolerating thus far   LOS (Days) 4 A FACE TO FACE EVALUATION WAS PERFORMED  Yenifer Saccente T 04/25/2014 8:46 AM

## 2014-04-25 NOTE — Progress Notes (Signed)
Social Work  Social Work Assessment and Plan  Patient Details  Name: Hayden Mckinney MRN: [id number] Date of Birth: 30-Jan-1945  Today's Date: 04/23/2014  Problem List:  Patient Active Problem List   Diagnosis Date Noted  . Cerebellar infarct 04/21/2014  . Aspiration pneumonia 04/20/2014  . Acute kidney injury 04/20/2014  . Speech abnormality   . Stroke 04/16/2014  . Embolic stroke [id number]  . Type II diabetes mellitus with neurological manifestations 08/25/2010  . HTN (hypertension) 08/25/2010  . Hyperlipidemia 08/25/2010   Past Medical History:  Past Medical History  Diagnosis Date  . Type II diabetes mellitus   . Sarcoma     a. R leg  . Diabetic peripheral neuropathy     a. both feet  . TIA (transient ischemic attack)     a. 08/2010.  Marland Kitchen Hyperlipidemia   . Hypertension   . Stroke     a. 04/2014 multiple bateral infarcts, presumed to be embolic;  b. [id number] Carotid U/S: 1-39% bilat ICA stenosis.  . H/O echocardiogram     a. 04/2014 Echo: EF 55-60%, no rwma.  Marland Kitchen ETOH abuse     a. 04/2014 18-24 beers q weekend.   Past Surgical History:  Past Surgical History  Procedure Laterality Date  . Sarcoma removal    . Repair of r hand after trauma    . Loop recorder implant N/A 04/20/2014    Procedure: LOOP RECORDER IMPLANT;  Surgeon: Thompson Grayer, MD;  Location: [facility] CATH LAB;  Service: Cardiovascular;  Laterality: N/A;  . Tee without cardioversion N/A 04/20/2014    Procedure: TRANSESOPHAGEAL ECHOCARDIOGRAM (TEE);  Surgeon: Larey Dresser, MD;  Location: [facility];  Service: Cardiovascular;  Laterality: N/A;   Social History:  reports that he has never smoked. He has never used smokeless tobacco. He reports that he drinks about 3.6 oz of alcohol per week. He reports that he does not use illicit drugs.  Family / Support Systems Marital Status: Single Patient Roles: Other (Comment) (has a roomate and half-sisters) Other Supports: sister, Hayden Mckinney @ [phone] Anticipated Caregiver: unclear who will be primary caregiver - Hayden Mckinney appears to be primary contact but does not know how she will provide 24/7 care Ability/Limitations of Caregiver: Hayden Mckinney works a "seasonal job" and cannot provide 24/7 care Caregiver Availability: Intermittent Family Dynamics: Sister presents as geniunely concerned about pt, however, poor insight into what his care needs will be.  Social History Preferred language: English Religion: Baptist Cultural Background: NA Read: Yes Write: Yes Employment Status: Retired Freight forwarder Issues: None Guardian/Conservator: None - per MD, pt capable of making decision on his own behalf   Abuse/Neglect Physical Abuse: Denies Verbal Abuse: Denies Sexual Abuse: Denies Exploitation of patient/patient's resources: Denies Self-Neglect: Denies  Emotional Status Pt's affect, behavior adn adjustment status: Truly unable to understand any of pt's answers to my questions.  Slurred speech impeding interview.  He does not "yes" to location when asked if he was in a hospital.  Does not appear to be in any emotional distress as he sits in w/c at nurses station.  Will monitor and refer to neuropsychology when appropriate. Recent Psychosocial Issues: None Pyschiatric History: None noted Substance Abuse History: chart indicates ETOH abuse - cannot confirm  Patient / Family Perceptions, Expectations & Goals Pt/Family understanding of illness & functional limitations: Cannot assess pt's understanding beyond knowing that he is in the hospital.  Sister with very limited understanding of his current functional limitations and anticipated needs post d/c.  Sister's first question, "....does he have bowel problems?"  When I began to explain his current cognitive state, her response is "oh shit....that sounds bad..." My conversation with sister was very scattered and difficult to follow her at times. Premorbid pt/family roles/activities: Pt  was living with a roomate.  Not employed. Anticipated changes in roles/activities/participation: Pt will likely require 24/7 care upon d/c.  Unclear who might provide this. Pt/family expectations/goals: Sister hopeful that pt will recover to the point that she can "check in on him" during the day.  Community Resources Express Scripts: None Premorbid Home Care/DME Agencies: None Transportation available at discharge: yes Resource referrals recommended: Neuropsychology  Discharge Planning Living Arrangements: Non-relatives/Friends Support Systems: Other relatives, Friends/neighbors Type of Residence: Private residence Insurance Resources: Chartered certified accountant Resources: Radio broadcast assistant Screen Referred: No Living Expenses: Education officer, community Management: Patient Does the patient have any problems obtaining your medications?: No Home Management: pt and roomate shared this Patient/Family Preliminary Plans: d/c plan uncertain at this time.  No solid plan for where pt will d/c or who/ how 24/7 care will be provided Barriers to Discharge: Self care, Family Support Social Work Anticipated Follow Up Needs: SNF, HH/OP Expected length of stay: 10-14 days  Clinical Impression Unfortunate gentleman here following a significant stroke and now with physical and cognitive deficits.  Limited support and doubtful 24/7 care will be able to be arranged.  Only noted family member, his half-sister, cannot provide 24/7 assist but plans to "talk with other family members" regarding d/c plan.  Could not really gather information from pt due to speech impairments and cognition.  Pt does appear to know he is in the hospital.  Will monitor emotional adjustment as he progresses through CIR.  Follow for support and d/c planning needs.  Hayden Mckinney 04/23/2014, 2:25 PM

## 2014-04-25 NOTE — Progress Notes (Signed)
Physical Therapy Session Note  Patient Details  Name: Hayden Mckinney MRN: [id number] Date of Birth: 1945/06/26  Today's Date: 04/25/2014 PT Individual Time: [id number] PT Individual Time Calculation (min): 30 min   Short Term Goals: Week 1:  PT Short Term Goal 1 (Week 1): Pt will be able to transfer with min A for basic transfers PT Short Term Goal 2 (Week 1): Pt will be able to demonstrate dynamic standing balance with min A PT Short Term Goal 3 (Week 1): Pt will be able to gait with mod A x 50' PT Short Term Goal 4 (Week 1): Pt will be able to propel w/c x 100' with S around obstacles  Skilled Therapeutic Interventions/Progress Updates:    Pt received supine in bed, agreeable to participate in therapy. Session focused on safety awareness, gait, standing balance. Pt continued to demonstrate impulsive behaviors throughout session, requiring close monitoring and max cueing for safety. Instructed pt in multiple stand pivot transfers, pt able to verbalize ~50% of setup, required Mod physical assist and max cueing to decrease pace and for RW management. Instructed pt in dynamic standing balance tasks of cone taps with alternating LE, then sorting numbered cards sequentially to address dual motor/cognitive task. Instructed pt in gait training w/ use of 60 bpm metronome to cue stepping to decrease impulsivity with gait. Pt tolerated session well. Pt left seated in w/c w/ NT present preparing to use bathroom.   Therapy Documentation Precautions:  Precautions Precautions: Fall Restrictions Weight Bearing Restrictions: No General:   Vital Signs:   Pain:  No/denies pain Mobility:   Locomotion :    Trunk/Postural Assessment :    Balance:   Exercises:   Other Treatments:    See FIM for current functional status  Therapy/Group: Individual Therapy  Rada Hay  Rada Hay, PT, DPT 04/25/2014, 12:54 PM

## 2014-04-25 NOTE — Progress Notes (Signed)
Nursing Note: Pt unable to void. A: in and out cathed by NT Jessica for 450 cc. Pt tolerated fair.wbb

## 2014-04-26 ENCOUNTER — Inpatient Hospital Stay (HOSPITAL_COMMUNITY): Payer: Medicare Other | Admitting: Occupational Therapy

## 2014-04-26 ENCOUNTER — Encounter (HOSPITAL_COMMUNITY): Payer: Medicare Other | Admitting: Speech Pathology

## 2014-04-26 ENCOUNTER — Inpatient Hospital Stay (HOSPITAL_COMMUNITY): Payer: Medicare Other

## 2014-04-26 ENCOUNTER — Inpatient Hospital Stay (HOSPITAL_COMMUNITY): Payer: Medicare Other | Admitting: Speech Pathology

## 2014-04-26 LAB — GLUCOSE, CAPILLARY
GLUCOSE-CAPILLARY: 155 mg/dL — AB (ref 70–99)
GLUCOSE-CAPILLARY: 159 mg/dL — AB (ref 70–99)
GLUCOSE-CAPILLARY: 111 mg/dL — AB (ref 70–99)
Glucose-Capillary: 225 mg/dL — ABNORMAL HIGH (ref 70–99)
Glucose-Capillary: 208 mg/dL — ABNORMAL HIGH (ref 70–99)

## 2014-04-26 LAB — CULTURE, BLOOD (ROUTINE X 2)
Culture: NO GROWTH
Culture: NO GROWTH

## 2014-04-26 MED ORDER — GLIMEPIRIDE 2 MG PO TABS
2.0000 mg | ORAL_TABLET | Freq: Every day | ORAL | Status: DC
  Administered 2014-04-26 – 2014-04-28 (×3): 2 mg via ORAL
  Filled 2014-04-26 (×6): qty 1

## 2014-04-26 NOTE — Progress Notes (Signed)
Physical Therapy Session Note  Patient Details  Name: Hayden Mckinney MRN: [id number] Date of Birth: 09/20/1945  Today's Date: 04/26/2014 PT Individual Time: 1000-1100 PT Individual Time Calculation (min): 60 min   Short Term Goals: Week 1:  PT Short Term Goal 1 (Week 1): Pt will be able to transfer with min A for basic transfers PT Short Term Goal 2 (Week 1): Pt will be able to demonstrate dynamic standing balance with min A PT Short Term Goal 3 (Week 1): Pt will be able to gait with mod A x 50' PT Short Term Goal 4 (Week 1): Pt will be able to propel w/c x 100' with S around obstacles  Skilled Therapeutic Interventions/Progress Updates:   Session focused on functional transfers, neuro re-ed for balance and gait training, administered Berg Balance (see results below), obstacle negotiation with RW with emphasis on safe technique using RW, and toileting in patient room. Pt requires min to mod A overall with mobility with mod verbal cues for safety but demonstrating improvement with repetition of activities and cues. Pt required min A for toileting needs for balance and cues for safe technique. When results of Merrilee Jansky discussed with pt, he attempted to get up and walk without assistance and resistant to recommendation for needing someone to A him. After further discussion pt verbalized understanding of importance of using AD and having someone around to A with mobility at this time. Safety belt donned and hand off to student RN to administer medication at end of session.   Therapy Documentation Precautions:  Precautions Precautions: Fall Restrictions Weight Bearing Restrictions: No   Pain: Pain Assessment Pain Assessment: No/denies pain    Balance: Balance Balance Assessed: Yes Standardized Balance Assessment Standardized Balance Assessment: Berg Balance Test Berg Balance Test Sit to Stand: Needs minimal aid to stand or to stabilize Standing Unsupported: Unable to stand 30 seconds  unassisted Sitting with Back Unsupported but Feet Supported on Floor or Stool: Able to sit 2 minutes under supervision Stand to Sit: Uses backs of legs against chair to control descent Transfers: Needs one person to assist Standing Unsupported with Eyes Closed: Needs help to keep from falling Standing Ubsupported with Feet Together: Needs help to attain position but able to stand for 30 seconds with feet together From Standing, Reach Forward with Outstretched Arm: Reaches forward but needs supervision From Standing Position, Pick up Object from Floor: Unable to try/needs assist to keep balance (able to pick up with min A) From Standing Position, Turn to Look Behind Over each Shoulder: Needs assist to keep from losing balance and falling Turn 360 Degrees: Needs assistance while turning Standing Unsupported, Alternately Place Feet on Step/Stool: Able to complete >2 steps/needs minimal assist Standing Unsupported, One Foot in Front: Loses balance while stepping or standing Standing on One Leg: Unable to try or needs assist to prevent fall Total Score: 10  See FIM for current functional status  Therapy/Group: Individual Therapy  Canary Brim Ivory Broad, PT, DPT  04/26/2014, 12:15 PM

## 2014-04-26 NOTE — Progress Notes (Signed)
[place] PHYSICAL MEDICINE & REHABILITATION     PROGRESS NOTE    Subjective/Complaints: Has some urethral discomfort from caths. Other than that he's feeling fairly well.     Objective: Vital Signs: Blood pressure 125/65, pulse 72, temperature 98 F (36.7 C), temperature source Oral, resp. rate 18, weight 64.774 kg (142 lb 12.8 oz), SpO2 96 %. No results found. No results for input(s): WBC, HGB, HCT, PLT in the last 72 hours. No results for input(s): NA, K, CL, GLUCOSE, BUN, CREATININE, CALCIUM in the last 72 hours.  Invalid input(s): CO CBG (last 3)   Recent Labs  04/25/14 1707 04/25/14 2124 04/26/14 0655  GLUCAP 164* 240* 155*    Wt Readings from Last 3 Encounters:  04/21/14 64.774 kg (142 lb 12.8 oz)  04/16/14 67.1 kg (147 lb 14.9 oz)  12/14/11 76.613 kg (168 lb 14.4 oz)    Physical Exam:   Gen:no acute distress,  HENT:edentulous. Oral mucosa pink and moist Head: Normocephalic.  Eyes: EOM are normal.  Neck: Normal range of motion. Neck supple. No thyromegaly present.  Cardiovascular: Normal rate and regular rhythm.no murmurs or rubs  Respiratory: Effort normal and breath sounds normal. No respiratory distress. no wheezes GI: Soft. Bowel sounds are normal. He exhibits no distension. Non-tender Neurological: He is alert.  Speech is  dysarthric but fairly intelligibile. He is able to provide his name and age as well as date of birth. Followed simple commands. Right limb ataxia, right PD. RUE motor 4 to 4+/5 delt,bic,tri,wrist, hand. RLE: 4/5 hf,ke. 0 to trace ADF and 3-4/5 APF. Sensation intact to pain on right side Skin: Skin is warm and dry.  Psych: pt pleasant and appropriate  Assessment/Plan: 1. 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 Physiatrist is providing close team supervision and 24 hour  management of active medical problems listed below. Physiatrist and rehab team continue to assess barriers to discharge/monitor patient progress toward functional and medical goals. FIM: FIM - Bathing Bathing Steps Patient Completed: Chest, Right Arm, Left Arm, Abdomen, Front perineal area, Right upper leg, Left upper leg, Left lower leg (including foot) Bathing: 4: Min-Patient completes 8-9 4f 10 parts or 75+ percent  FIM - Upper Body Dressing/Undressing Upper body dressing/undressing steps patient completed: Pull shirt over trunk, Thread/unthread right sleeve of pullover shirt/dresss, Thread/unthread left sleeve of pullover shirt/dress, Put head through opening of pull over shirt/dress Upper body dressing/undressing: 5: Set-up assist to: Obtain clothing/put away FIM - Lower Body Dressing/Undressing Lower body dressing/undressing steps patient completed: Pull pants up/down, Thread/unthread left pants leg Lower body dressing/undressing: 0: Wears gown/pajamas-no public clothing (had no clean pants)  FIM - Toileting Toileting steps completed by patient: Adjust clothing prior to toileting, Performs perineal hygiene, Adjust clothing after toileting Toileting Assistive Devices: Grab bar or rail for support Toileting: 4: Steadying assist  FIM - Radio producer Devices: Grab bars Toilet Transfers: 4-From toilet/BSC: Min A (steadying Pt. > 75%), 4-To toilet/BSC: Min A (steadying Pt. > 75%)  FIM - Bed/Chair Transfer Bed/Chair Transfer Assistive Devices: Arm rests Bed/Chair Transfer: 5: Supine > Sit: Supervision (verbal cues/safety issues), 3: Bed > Chair or W/C: Mod A (lift or lower assist), 3: Chair or W/C > Bed: Mod A (lift or lower assist)  FIM - Locomotion: Wheelchair Distance: 100 Locomotion: Wheelchair: 5: Travels 150 ft or more: maneuvers on rugs and over door sills with supervision, cueing  or coaxing FIM - Locomotion: Ambulation Locomotion: Ambulation  Assistive Devices: Administrator Ambulation/Gait Assistance: 3: Mod assist Locomotion: Ambulation: 1: Travels less than 50 ft with moderate assistance (Pt: 50 - 74%)  Comprehension Comprehension Mode: Auditory Comprehension: 5-Understands basic 90% of the time/requires cueing < 10% of the time  Expression Expression Mode: Verbal Expression: 4-Expresses basic 75 - 89% of the time/requires cueing 10 - 24% of the time. Needs helper to occlude trach/needs to repeat words.  Social Interaction Social Interaction: 4-Interacts appropriately 75 - 89% of the time - Needs redirection for appropriate language or to initiate interaction.  Problem Solving Problem Solving: 3-Solves basic 50 - 74% of the time/requires cueing 25 - 49% of the time  Memory Memory: 3-Recognizes or recalls 50 - 74% of the time/requires cueing 25 - 49% of the time  Medical Problem List and Plan: 1. 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 DVT Prophylaxis/Anticoagulation: Subcutaneous Lovenox. Monitor platelet counts and any signs of bleeding. Venous Dopplers negative 3. Pain Management: Lyrica 50 mg twice a day Tylenol as needed 4. Hypertension. Coreg 6.25 mg twice a day,BIDIL 20-30 7.5 mg 3 times a day. Monitor with increased mobility  5. Neuropsych: This patient is capable of making decisions on his own behalf. 6. Skin/Wound Care: Routine skin checks  7. Fluids/Electrolytes/Nutrition: encourage adequate po 8. Diabetes mellitus with peripheral neuropathy. Latest hemoglobin A1c 7.9. Presently with sliding scale insulin. Patient on Glucophage 1000 mg twice a day prior to admission--> Will hold given Cr. Introduce amaryl 2mg  daily 9. Question left lower lobe pneumonia. Continue antibiotic therapy for now with Augmentin 6 days. Encourage incentive spirometry. Follow-up chest x-ray if indicated  -dc abx 10. Acute on chronic renal insufficiency. Cr around  1.5  -neurogenic bladder/urine retention---ua neg, culture negative   -added flomax   -prn caths, PVR's not too high 11. Dysphagia. Diet changed to mechanical soft nectar liquids after modified barium swallow 04/21/2014.    -tolerating thus far   LOS (Days) 5 A FACE TO FACE EVALUATION WAS PERFORMED  Hayden Mckinney 04/26/2014 7:11 AM

## 2014-04-26 NOTE — Progress Notes (Addendum)
Speech Language Pathology Daily Session Note  Patient Details  Name: Hayden Mckinney MRN: [id number] Date of Birth: Nov 10, 1945  Today's Date: 04/26/2014 SLP Group Time: [id number] (Co-tx with OT, 1130-1205 total time) SLP Group Time Calculation (min): 20 min  Short Term Goals: Week 1: SLP Short Term Goal 1 (Week 1): Patient will consume least restrictive diet with Min A verbal and visual cues for use of swallow strategies. SLP Short Term Goal 2 (Week 1): Patient will consume trials of thin liquids with minimal overt s/s of aspiration in 90% of trials over 3 consecutive sessions. SLP Short Term Goal 3 (Week 1): Patient will increase speech intelligibility to ~75% at the phrase level with Mod A visual and verbal cues for use of intelligibility strategies. SLP Short Term Goal 4 (Week 1): Patient will demonstrate mildly complex problem-solving in functional tasks given Min A visual and verbal cues in 75% of observable instances. SLP Short Term Goal 5 (Week 1): Patient will utilize external aid to recall new, daily information, such as diet restrictions, with Min A verbal and visual cues over 3 consecutive sessions. SLP Short Term Goal 6 (Week 1): Patient will identify 2 new physical and 2 new cognitive deficits with Min A verbal and question cues over 3 consecutive sessions.  Skilled Therapeutic Interventions:  Pt was seen for skilled group ST targeting dysphagia goals.  Pt was seated upright in wheelchair, awake, alert, and agreeable to participate in group therapy.  SLP completed skilled observations during presentations of pt's currently prescribed diet.  Pt consumed dys 3 textures and nectar thick liquids with mod verbal and tactile cues for rate and portion control.  Pt demonstrated x1 strong, immediate reflexive cough following a large bite of dys 3 meats which SLP suspects to be related to increased pharyngeal residue.   No overt s/s of aspiration were noted with nectar thick liquids.  Continue  per current plan of care.     FIM:  Comprehension Comprehension Mode: Auditory Comprehension: 5-Understands basic 90% of the time/requires cueing < 10% of the time Expression Expression Mode: Verbal Expression: 4-Expresses basic 75 - 89% of the time/requires cueing 10 - 24% of the time. Needs helper to occlude trach/needs to repeat words. Social Interaction Social Interaction: 4-Interacts appropriately 75 - 89% of the time - Needs redirection for appropriate language or to initiate interaction. Problem Solving Problem Solving: 3-Solves basic 50 - 74% of the time/requires cueing 25 - 49% of the time Memory Memory: 3-Recognizes or recalls 50 - 74% of the time/requires cueing 25 - 49% of the time FIM - Eating Eating Activity: 5: Needs verbal cues/supervision;4: Helper checks for pocketed food  Pain Pain Assessment Pain Assessment: No/denies pain  Therapy/Group: Dysphagia Group  Miliano Cotten, Selinda Orion 04/26/2014, 3:37 PM

## 2014-04-26 NOTE — Progress Notes (Signed)
Occupational Therapy Session Note  Patient Details  Name: Hayden Mckinney MRN: [id number] Date of Birth: 1945/07/12  Today's Date: 04/26/2014 OT Individual Time: [id number] Individual Time Calculation (min): 60 min    Short Term Goals: Week 1:  OT Short Term Goal 1 (Week 1): Pt will complete toilet transfers with min A OT Short Term Goal 2 (Week 1): Pt will complete shower transfers with min A OT Short Term Goal 3 (Week 1): Pt will dress LB with min A OT Short Term Goal 4 (Week 1): Pt will bathe 10/10 body parts with steadying assist OT Short Term Goal 5 (Week 1): Pt will maintain functional static standing balance with min A to complete grooming task  Skilled Therapeutic Interventions/Progress Updates:    Pt seen for ADL bathing and dressing session, and neuro re-education/ therapeutic activity focusing on standing balance and activity tolerance. Pt in supine upon arrival, agreeable to tx. He was impulsive with all mobility, required VCs for safety and to slow down prior to participation in therapeutic activity. Pt transferred supine> EOB with supervision, and min A EOB> w/c. Pt completed showering task seated on 3-1 BSC with supervision. Pt instructed on 2 rules of showering, and was able to recall 2 rules throughout session and abide by rules while completing task, demonstrating functional short term memory. Pt transferred into/ out of shower with min A with stand pivot transfers from w/c<> 3-1 BSC. Pt completed shaving task at the sink, using R UE as dominant extremity, and occasionally using B UEs to hold razor and save for stability. Pt completed dressing and grooming tasks at the sink with overall supervision and occasional VCs for safety and steadying assist when standing to pull pants up. Following grooming task, pt transferred to therapy gym with total A for time. In gym, pt completed pipe tree task, demonstrating functional use of B UEs. He completed simple pipe tree design from  picture in reasonable amount of time. Pt then choose design to replicate, and was unable to finish by end of session. He demonstrated good attention to task, however, became slightly frustrated when unable to fit pieces together, though demonstrated good emotional regulation skills. He tolerated 2 standing trials of ~5 minutes each with min steadying assist. Pt returned to w/c at end of session, and left at nurses station with newspaper provided.   Therapy Documentation Precautions:  Precautions Precautions: Fall Restrictions Weight Bearing Restrictions: No Pain: Pain Assessment Pain Assessment: No/denies pain  See FIM for current functional status  Therapy/Group: Individual Therapy  [name], Julyanna Scholle C 04/26/2014, 10:45 AM

## 2014-04-26 NOTE — Progress Notes (Signed)
Occupational Therapy Note  Patient Details  Name: Hayden Mckinney MRN: [id number] Date of Birth: 1945-02-08  Today's Date: 04/26/2014 OT Group Time: 1130-1145 (cotx with ST - total time 1130-1205) OT Group Time Calculation (min): 15 min   Pt participated in self feeding group with focus on attention to left, LUE use for functional tasks and portion control.  Pt required mod verbal cues for portion control.  Pt uses LUE to assist with self feeding tasks and to open containers.   Le[facility]is [facility] 04/26/2014, 1:02 PM

## 2014-04-26 NOTE — Progress Notes (Signed)
Speech Language Pathology Daily Session Note  Patient Details  Name: Hayden Mckinney MRN: [id number] Date of Birth: [birth year]  Today's Date: 04/26/2014 SLP Individual Time: 1330-1430 SLP Individual Time Calculation (min): 60 min  Short Term Goals: Week 1: SLP Short Term Goal 1 (Week 1): Patient will consume least restrictive diet with Min A verbal and visual cues for use of swallow strategies. SLP Short Term Goal 2 (Week 1): Patient will consume trials of thin liquids with minimal overt s/s of aspiration in 90% of trials over 3 consecutive sessions. SLP Short Term Goal 3 (Week 1): Patient will increase speech intelligibility to ~75% at the phrase level with Mod A visual and verbal cues for use of intelligibility strategies. SLP Short Term Goal 4 (Week 1): Patient will demonstrate mildly complex problem-solving in functional tasks given Min A visual and verbal cues in 75% of observable instances. SLP Short Term Goal 5 (Week 1): Patient will utilize external aid to recall new, daily information, such as diet restrictions, with Min A verbal and visual cues over 3 consecutive sessions. SLP Short Term Goal 6 (Week 1): Patient will identify 2 new physical and 2 new cognitive deficits with Min A verbal and question cues over 3 consecutive sessions.  Skilled Therapeutic Interventions: Skilled treatment session focused on speech and dysphagia goals. Patient received upright in w/c with quick-release belt donned from nurse's station and self-propelled to dayroom to facilitate attention and arousal. Student facilitated session with Mod A fading to Min A verbal cues for use of swallowing strategies with PO trials of thin liquid via cup and straw, which patient consumed with no overt s/s of aspiration. Student further facilitated session with mildly complex category generation and memory task, which patient completed with Supervision A verbal cues for recall of category items and rules associated with task;  patient furthermore recalled route to room with Mod I. Patient additionally required Mod A fading to Min A verbal and question cues for recall of previously taught intelligible speech strategies and Mod A verbal cues for use of aforementioned strategies to acheive overall speech intelligibility of ~50-75% at the phrase level, therefore, student further facilitated use of intelligible speech strategies through creation of external aid. Patient continues to present with decreased safety awareness and required Mod A question cues to identify risks associated with consumption of thin liquids and ambulating around room without assist. Patient returned to nurse's station in w/c with quick-release belt in place. Continue with current plan of care.   FIM:  Comprehension Comprehension Mode: Auditory Comprehension: 5-Understands basic 90% of the time/requires cueing < 10% of the time Expression Expression Mode: Verbal Expression: 3-Expresses basic 50 - 74% of the time/requires cueing 25 - 50% of the time. Needs to repeat parts of sentences. Social Interaction Social Interaction: 4-Interacts appropriately 75 - 89% of the time - Needs redirection for appropriate language or to initiate interaction. Problem Solving Problem Solving: 4-Solves basic 75 - 89% of the time/requires cueing 10 - 24% of the time Memory Memory: 5-Recognizes or recalls 90% of the time/requires cueing < 10% of the time FIM - Eating Eating Activity: 6: Modified consistency diet: (comment);6: Swallowing techniques: self-managed;5: Supervision/cues;5: Needs verbal cues/supervision  Pain Pain Assessment Pain Assessment: No/denies pain  Therapy/Group: Individual Therapy  Servando Snare 04/26/2014, 4:56 PM

## 2014-04-26 NOTE — Progress Notes (Signed)
Social Work Patient ID: CARLY MILOS, male   DOB: November 07, 1945, 69 y.o.   MRN: [id number]   Received call this morning from pt's half-sister, Vivien Rota, who has further questions about pt's dc plan.  Reports she has "talked with Medicare and Medicaid" and understands that she might be able to be paid for providing care to pt at her home.  Attempted to explain to her that [facility] will not reimburse family members and that getting paid via [employer] is a lengthy process which will require her to receive training and be employed thru a [employer].  I stressed to Vivien Rota that is is not reasonable on her part to anticipate being paid for caring for pt immediately after hospital d/c.   She seems to understand the basic point I was making.  She then asks if I know how much pt is currently receiving from [employer].  I have stressed to her that if she plans to bring pt home with her, then she needs to be realistic that she will not be receiving any monies for this.  She states that she understands but still wants to plan on having him come to her home after d/c and she will commit to 24/7 care.  Have scheduled for her to be here tomorrow morning at 9 am to begin caregiver training.  I will follow up with her following the therapy sessions and discuss care plan further.  Continue to follow.  Crystallynn Noorani, LCSW

## 2014-04-27 ENCOUNTER — Inpatient Hospital Stay (HOSPITAL_COMMUNITY): Payer: Medicare Other

## 2014-04-27 ENCOUNTER — Encounter (HOSPITAL_COMMUNITY): Payer: Medicare Other | Admitting: Occupational Therapy

## 2014-04-27 ENCOUNTER — Inpatient Hospital Stay (HOSPITAL_COMMUNITY): Payer: Medicare Other | Admitting: Speech Pathology

## 2014-04-27 LAB — GLUCOSE, CAPILLARY
GLUCOSE-CAPILLARY: 119 mg/dL — AB (ref 70–99)
Glucose-Capillary: 152 mg/dL — ABNORMAL HIGH (ref 70–99)
Glucose-Capillary: 161 mg/dL — ABNORMAL HIGH (ref 70–99)
Glucose-Capillary: 188 mg/dL — ABNORMAL HIGH (ref 70–99)

## 2014-04-27 NOTE — Progress Notes (Signed)
Occupational Therapy Session Note  Patient Details  Name: Hayden Mckinney MRN: [id number] Date of Birth: [birth year]  Today's Date: 04/27/2014 OT Individual Time: 0845-1000 OT Individual Time Calculation (min): 75 min    Short Term Goals: Week 1:  OT Short Term Goal 1 (Week 1): Pt will complete toilet transfers with min A OT Short Term Goal 2 (Week 1): Pt will complete shower transfers with min A OT Short Term Goal 3 (Week 1): Pt will dress LB with min A OT Short Term Goal 4 (Week 1): Pt will bathe 10/10 body parts with steadying assist OT Short Term Goal 5 (Week 1): Pt will maintain functional static standing balance with min A to complete grooming task  Skilled Therapeutic Interventions/Progress Updates:    Pt seen for OT session focusing on functional transfers, functional activity tolerance, and caregiver training. Pt in supine upon arrival, with bed alarm ringing as pt was attempting to reach for bed side table to access his wallet. Education provided regarding need for assist and importance of safety. Pt's sister present for caregiver training. Pt transferred supine> EOB with VCs for safety. He transferred EOB> w/c with min A and VCs for technique. Pt declined bathing task, and completed grooming task seated in w/c at sink with increased time to open containers and supervision. Pt then taken to ADL apartment and completed functional transfer on to tub transfer bench from w/c. He required min-mod A with max VCs for sequencing. Pt then completed toileting transfers with min physical assist and max VCs for sequencing and proper foot placement prior to transfer. Education and demonstration provided for caregiver,  Caregiver then provided assist for transfers for remainder of session with cues for proper body mechanics, assisting from the hips and verbal cues for sequencing required by therapist. Pt demonstrated ability to provide assist for transfer, however, unsure at this time if caregiver is  able to provide needed 24 hour cognitive and physical assist at d/c. Pt completed transfers from low surface couch and standard bed with min-mod physical assist and max cuing for sequencing.   Pt provided with level II therband and educated regarding UE strengthening exercises, OT will cont to educate pt with UE exercises.    Pt left in w/c at end of session with SLP. Education provided regarding pt's need for 24 hr cognitive and physical assist, proper body mechanics for transfers, role of OT, PT, and SLP, POC, DME, home layout, need for assist, UE ROM and strengthening exercises, and d/c planning.   Therapy Documentation Precautions:  Precautions Precautions: Fall Restrictions Weight Bearing Restrictions: No Pain: Pain Assessment Pain Assessment: No/denies pain  See FIM for current functional status  Therapy/Group: Individual Therapy  [name], Tracen Mahler C 04/27/2014, 12:09 PM

## 2014-04-27 NOTE — Plan of Care (Signed)
Problem: RH Toileting Goal: LTG Patient will perform toileting w/assist, cues/equip (OT) LTG: Patient will perform toiletiing (clothes management/hygiene) with assist, with/without cues using equipment (OT)  Modified 04/27/14- ACL

## 2014-04-27 NOTE — Progress Notes (Addendum)
Physical Therapy Session Note  Patient Details  Name: Hayden Mckinney MRN: [id number] Date of Birth: 1945-10-25  Today's Date: 04/27/2014 PT Individual Time: 1110-1210 PT Individual Time Calculation (min): 60 min   Short Term Goals: Week 1:  PT Short Term Goal 1 (Week 1): Pt will be able to transfer with min A for basic transfers PT Short Term Goal 2 (Week 1): Pt will be able to demonstrate dynamic standing balance with min A PT Short Term Goal 3 (Week 1): Pt will be able to gait with mod A x 50' PT Short Term Goal 4 (Week 1): Pt will be able to propel w/c x 100' with S around obstacles  Skilled Therapeutic Interventions/Progress Updates:  Sister here for family ed to establish her ability to take pt to her home or not, at d/c.  Squat pivot transfer w/c> mat to L with min assist.  Sit>< stand with mod assist due to LOB L without balance reactions to recover.  Gait x 55' with mod> min assist including turn; mod cues for slowing down, x 1 with PT, and x 1, with same with sister.  Sister required mod cueing to slow down pt, have him place hands safely, stay within RW, etc.  neuromuscular re-education for R and LLE coordination/motor control for: seated-20 x 1 heel slides, 10 x 1 ankle DF  Supported standing- 10 x 1 each calf raises, mini squats ; supported standing on wedge- bil calf and hamstring stretching  x 3 minutes. Pt without any balance strategies when give external perturbations, posteriorly,  in standing.  Repetitive sit>< stand with limited use of UEs for balance retraining.  Up/down 5 steps 2 rails, min guard assist, with variable self- selected step- to and step- through method, X 1 with PT, x 1 with sister. Pt impulsive and difficult to slow down to cue for sequencing on steps.   Sister educated on best placement for hands during sit>< stand and transfers, to assist with balance during transfers,  rather than attempting to lift pt.  She improved in cueing pt during tx, but  needs further ed before d/c, if he goes to her house.  Pt's impulsivity appears to be the greatest safety concern for d/c, especially regarding getting up by himself .   For activity tolerance, w/c prop x 150' with supervision, using bil UEs, holding bil LEs up without footrests.  Quick release belt applied, and pt left sitting in w/c at nurse's station for supervision.    Therapy Documentation Precautions:  Precautions Precautions: Fall Restrictions Weight Bearing Restrictions: No   Pain: Pain Assessment Pain Assessment: No/denies pain   Locomotion : Ambulation Ambulation/Gait Assistance: 3: Mod assist;4: Min assist Wheelchair Mobility Distance: 160     See FIM for current functional status  Therapy/Group: Individual Therapy  Fawn Desrocher 04/27/2014, 12:21 PM

## 2014-04-27 NOTE — Progress Notes (Signed)
PHYSICAL MEDICINE & REHABILITATION     PROGRESS NOTE    Subjective/Complaints: Slept well. Emptying bladder better     Objective: Vital Signs: Blood pressure 133/68, pulse 76, temperature 98.2 F (36.8 C), temperature source Oral, resp. rate 18, weight 65.1 kg (143 lb 8.3 oz), SpO2 97 %. No results found. No results for input(s): WBC, HGB, HCT, PLT in the last 72 hours. No results for input(s): NA, K, CL, GLUCOSE, BUN, CREATININE, CALCIUM in the last 72 hours.  Invalid input(s): CO CBG (last 3)   Recent Labs  04/26/14 2058 04/26/14 2225 04/27/14 0702  GLUCAP 225* 208* 152*    Wt Readings from Last 3 Encounters:  04/27/14 65.1 kg (143 lb 8.3 oz)  04/16/14 67.1 kg (147 lb 14.9 oz)  12/14/11 76.613 kg (168 lb 14.4 oz)    Physical Exam:   Gen:no acute distress,  HENT:edentulous. Oral mucosa pink and moist Head: Normocephalic.  Eyes: EOM are normal.  Neck: Normal range of motion. Neck supple. No thyromegaly present.  Cardiovascular: Normal rate and regular rhythm.no murmurs or rubs  Respiratory: Effort normal and breath sounds normal. No respiratory distress. no wheezes GI: Soft. Bowel sounds are normal. He exhibits no distension. Non-tender Neurological: He is alert.  Speech is  dysarthric but fairly intelligibile. He is able to provide his name and age as well as date of birth. Followed simple commands. Right limb ataxia, right PD. RUE motor 4 to 4+/5 delt,bic,tri,wrist, hand. RLE: 4/5 hf,ke. 0 to trace ADF and 3-4/5 APF. Sensation intact to pain on right side Skin: Skin is warm and dry.  Psych: pt pleasant and appropriate  Assessment/Plan: 1. 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 Physiatrist is providing close team supervision and 24 hour management of active medical problems listed  below. Physiatrist and rehab team continue to assess barriers to discharge/monitor patient progress toward functional and medical goals. FIM: FIM - Bathing Bathing Steps Patient Completed: Chest, Right Arm, Left Arm, Abdomen, Front perineal area, Right upper leg, Left upper leg, Left lower leg (including foot), Right lower leg (including foot), Buttocks Bathing: 5: Supervision: Safety issues/verbal cues  FIM - Upper Body Dressing/Undressing Upper body dressing/undressing steps patient completed: Pull shirt over trunk, Thread/unthread right sleeve of pullover shirt/dresss, Thread/unthread left sleeve of pullover shirt/dress, Put head through opening of pull over shirt/dress Upper body dressing/undressing: 5: Set-up assist to: Obtain clothing/put away FIM - Lower Body Dressing/Undressing Lower body dressing/undressing steps patient completed: Pull pants up/down, Thread/unthread left pants leg, Thread/unthread right underwear leg, Thread/unthread left underwear leg, Thread/unthread right pants leg, Pull underwear up/down, Fasten/unfasten pants, Don/Doff right sock, Don/Doff left sock Lower body dressing/undressing: 4: Steadying Assist  FIM - Toileting Toileting steps completed by patient: Performs perineal hygiene, Adjust clothing prior to toileting, Adjust clothing after toileting Toileting Assistive Devices: Grab bar or rail for support Toileting: 4: Steadying assist  FIM - Radio producer Devices: Grab bars Toilet Transfers: 4-From toilet/BSC: Min A (steadying Pt. > 75%), 4-To toilet/BSC: Min A (steadying Pt. > 75%)  FIM - Bed/Chair Transfer Bed/Chair Transfer Assistive Devices: Arm rests Bed/Chair Transfer: 4: Bed > Chair or W/C: Min A (steadying Pt. > 75%), 4: Chair or W/C > Bed: Min A (steadying Pt. > 75%)  FIM - Locomotion: Wheelchair Distance: 100 Locomotion: Wheelchair: 5: Travels 150 ft or more: maneuvers on rugs and over door sills with  supervision,  cueing or coaxing FIM - Locomotion: Ambulation Locomotion: Ambulation Assistive Devices: Walker - Rolling Ambulation/Gait Assistance: 4: Min assist, 3: Mod assist Locomotion: Ambulation: 2: Travels 50 - 149 ft with moderate assistance (Pt: 50 - 74%)  Comprehension Comprehension Mode: Auditory Comprehension: 5-Understands basic 90% of the time/requires cueing < 10% of the time  Expression Expression Mode: Verbal Expression: 3-Expresses basic 50 - 74% of the time/requires cueing 25 - 50% of the time. Needs to repeat parts of sentences.  Social Interaction Social Interaction: 4-Interacts appropriately 75 - 89% of the time - Needs redirection for appropriate language or to initiate interaction.  Problem Solving Problem Solving: 4-Solves basic 75 - 89% of the time/requires cueing 10 - 24% of the time  Memory Memory: 5-Recognizes or recalls 90% of the time/requires cueing < 10% of the time  Medical Problem List and Plan: 1. 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 DVT Prophylaxis/Anticoagulation: Subcutaneous Lovenox. Monitor platelet counts and any signs of bleeding. Venous Dopplers negative 3. Pain Management: Lyrica 50 mg twice a day Tylenol as needed 4. Hypertension. Coreg 6.25 mg twice a day,BIDIL 20-30 7.5 mg 3 times a day. Monitor with increased mobility  5. Neuropsych: This patient is capable of making decisions on his own behalf. 6. Skin/Wound Care: Routine skin checks  7. Fluids/Electrolytes/Nutrition: encourage adequate po 8. Diabetes mellitus with peripheral neuropathy. Latest hemoglobin A1c 7.9. Presently with sliding scale insulin. Patient on Glucophage 1000 mg twice a day prior to admission--> Will hold given Cr. Introduced amaryl 2mg  daily--observe 9. Question left lower lobe pneumonia. abx stopped. afebrile 10. Acute on chronic renal insufficiency. Cr around 1.5  -neurogenic bladder/urine retention---ua neg, culture  negative   -added flomax   -volumes are better 11. Dysphagia. Diet changed to mechanical soft nectar liquids after modified barium swallow 04/21/2014.    -tolerating thus far   LOS (Days) 6 A FACE TO FACE EVALUATION WAS PERFORMED  SWARTZ,ZACHARY T 04/27/2014 8:06 AM

## 2014-04-27 NOTE — Progress Notes (Signed)
Speech Language Pathology Daily Session Note  Patient Details  Name: Hayden Mckinney MRN: [id number] Date of Birth: 01/16/1945  Today's Date: 04/27/2014 SLP Individual Time: 1000-1100 SLP Individual Time Calculation (min): 60 min  Short Term Goals: Week 1: SLP Short Term Goal 1 (Week 1): Patient will consume least restrictive diet with Min A verbal and visual cues for use of swallow strategies. SLP Short Term Goal 2 (Week 1): Patient will consume trials of thin liquids with minimal overt s/s of aspiration in 90% of trials over 3 consecutive sessions. SLP Short Term Goal 3 (Week 1): Patient will increase speech intelligibility to ~75% at the phrase level with Mod A visual and verbal cues for use of intelligibility strategies. SLP Short Term Goal 4 (Week 1): Patient will demonstrate mildly complex problem-solving in functional tasks given Min A visual and verbal cues in 75% of observable instances. SLP Short Term Goal 5 (Week 1): Patient will utilize external aid to recall new, daily information, such as diet restrictions, with Min A verbal and visual cues over 3 consecutive sessions. SLP Short Term Goal 6 (Week 1): Patient will identify 2 new physical and 2 new cognitive deficits with Min A verbal and question cues over 3 consecutive sessions.  Skilled Therapeutic Interventions: Skilled treatment session focused on cognitive, speech, and dysphagia goals and family education. Patient received from OT upright in w/c with quick-release belt on and sister present. Patient propelled to room and completed oral care under student supervision before being propelled to dayroom to facilitate attention and awareness. Patient consumed sips of thin liquid via cup and straw with minimal overt s/s of aspiration characterized by delayed cough x1, suspect due to impulsivity regarding rate and bolus size. Patient required Mod A fading to Min A for use of swallow strategies. Student facilitated session through  administration of 'Reasoning' portion of the Scales of Cognitive Ability for Traumatic Brain Injury (SCATBI). Patient scored 23 out of 42 possible points, with deficits noted in higher-level cognitive tasks such as explanation of idioms, proverbs, and verbal absurdities. Due to informal nature of assessment, formal standardized scores are not offered here, but patient was able to maintain selective attention throughout assessment with Supervision A verbal cues but required Mod A verbal cues for use of intelligible speech strategies throughout assessment. Patient furthermore required Min A verbal cues for recall of intelligible speech strategies and demonstrated overall intelligibility this session of ~50% at the phrase level. Student further facilitated session by providing education to patient's sister regarding patient's current level of cognitive, speech, and swallowing function, need for 24-hour supervision especially during mealtimes, and patient's current impulsivity and decreased safety awareness, as patient required Mod A question cues for anticipatory awareness of deficits. Patient's sister verbalized understanding but suspect will require reinforcement. Patient handed off to PT with sister present; continue with current plan of care.   FIM:  Comprehension Comprehension Mode: Auditory Comprehension: 5-Understands basic 90% of the time/requires cueing < 10% of the time Expression Expression Mode: Verbal Expression: 3-Expresses basic 50 - 74% of the time/requires cueing 25 - 50% of the time. Needs to repeat parts of sentences. Social Interaction Social Interaction: 4-Interacts appropriately 75 - 89% of the time - Needs redirection for appropriate language or to initiate interaction. Problem Solving Problem Solving: 4-Solves basic 75 - 89% of the time/requires cueing 10 - 24% of the time Memory Memory: 5-Recognizes or recalls 90% of the time/requires cueing < 10% of the time FIM - Eating Eating  Activity: 6: Modified  consistency diet: (comment);5: Supervision/cues;5: Needs verbal cues/supervision  Pain No/Denies Pain  Therapy/Group: Individual Therapy  Servando Snare 04/27/2014, 4:12 PM

## 2014-04-28 ENCOUNTER — Inpatient Hospital Stay (HOSPITAL_COMMUNITY): Payer: Medicare Other | Admitting: Speech Pathology

## 2014-04-28 ENCOUNTER — Encounter (HOSPITAL_COMMUNITY): Payer: Medicare Other | Admitting: Speech Pathology

## 2014-04-28 ENCOUNTER — Inpatient Hospital Stay (HOSPITAL_COMMUNITY): Payer: Medicare Other

## 2014-04-28 ENCOUNTER — Inpatient Hospital Stay (HOSPITAL_COMMUNITY): Payer: Medicare Other | Admitting: Occupational Therapy

## 2014-04-28 LAB — GLUCOSE, CAPILLARY
Glucose-Capillary: 124 mg/dL — ABNORMAL HIGH (ref 70–99)
Glucose-Capillary: 110 mg/dL — ABNORMAL HIGH (ref 70–99)
Glucose-Capillary: 102 mg/dL — ABNORMAL HIGH (ref 70–99)
Glucose-Capillary: 137 mg/dL — ABNORMAL HIGH (ref 70–99)

## 2014-04-28 NOTE — Progress Notes (Signed)
Speech Language Pathology Note  Patient Details  Name: Hayden Mckinney MRN: [id number] Date of Birth: 1945/12/08 Today's Date: 04/28/2014  MBSS complete. Full report located under chart review in imaging section.    [name] 04/28/2014, 10:23 AM

## 2014-04-28 NOTE — Progress Notes (Signed)
Occupational Therapy Session Note  Patient Details  Name: Hayden Mckinney MRN: [id number] Date of Birth: August 30, 1945  Today's Date: 04/28/2014 OT Individual Time: 1500-1530 OT Individual Time Calculation (min): 30 min    Short Term Goals: Week 1:  OT Short Term Goal 1 (Week 1): Pt will complete toilet transfers with min A OT Short Term Goal 2 (Week 1): Pt will complete shower transfers with min A OT Short Term Goal 3 (Week 1): Pt will dress LB with min A OT Short Term Goal 4 (Week 1): Pt will bathe 10/10 body parts with steadying assist OT Short Term Goal 5 (Week 1): Pt will maintain functional static standing balance with min A to complete grooming task  Skilled Therapeutic Interventions/Progress Updates:    Pt seen for OT session focusing on functional mobility from w/c level and static/dynamic standing balance. Pt in w/c at nurses station at beginning of session. Pt self-propelled to pt laundry facility, recalling where room is located without assist. From w/c level, pt gathered items out of dryer with supervision. He then self propelled with B UE and LEs to therapy gym  where he stood at elevated table to fold laundry with supervision and mod VCs for safety during transfer and for w/c management. Pt returned to room in w/c, self propelling ~50 yards before requesting assist due to increased pain in R hand from cut in previous session. In room, pt navigated w/c through room to place clothes in drawer with supervision and cues for safety. Pt then requested toileting task Pt took w/c into bathroom, and completed stand pivot transfer to stand at the toilet, completing standing toileting with steadying assist, and return to w/c with mod A for controlled descent. Pt then self propelled w/c back to nurses station where he was left for supervision with safety belt donned.   Therapy Documentation Precautions:  Precautions Precautions: Fall Restrictions Weight Bearing Restrictions: No Pain: Pain  Assessment Pain Assessment: No/denies pain  See FIM for current functional status  Therapy/Group: Individual Therapy  [name], Arrabella Westerman C 04/28/2014, 3:51 PM

## 2014-04-28 NOTE — Progress Notes (Signed)
Social Work  Patient ID: Hayden Mckinney, male   DOB: [birth year], 69 y.o.   MRN: [id number]   Spoke with pt and wife half-sister yesterday and today to review team conference.  Sister reports that she is not able to provide level of care pt needs at home as she must work.  She is agreeable to SW pursuing SNF placement.  Attempted to explain need for SNF to pt, however, cannot really tell how much he understood the information.  He states he is agreeable with going to another care facility following CIR for further therapy.  Will send FL2 out to area facilities and keep team posted on bed offers.  Riah Kehoe, LCSW

## 2014-04-28 NOTE — Patient Care Conference (Signed)
Inpatient RehabilitationTeam Conference and Plan of Care Update Date: 04/27/2014   Time: 2:30 PM    Patient Name: Hayden Mckinney      Medical Record Number: [id number]  Date of Birth: 1945/06/19 Sex: Male         Room/Bed: 4W08C/4W08C-01 Payor Info: Payor: MEDICARE / Plan: MEDICARE PART A AND B / Product Type: *No Product type* /    Admitting Diagnosis: R CEREBELLAR AND [place] INFARCTS  Admit Date/Time:  04/21/2014  3:37 PM Admission Comments: No comment available   Primary Diagnosis:  Cerebellar infarct Principal Problem: Cerebellar infarct  Patient Active Problem List   Diagnosis Date Noted  . Cerebellar infarct 04/21/2014  . Aspiration pneumonia 04/20/2014  . Acute kidney injury 04/20/2014  . Speech abnormality   . Stroke 04/16/2014  . Embolic stroke 99991111  . Type II diabetes mellitus with neurological manifestations 08/25/2010  . HTN (hypertension) 08/25/2010  . Hyperlipidemia 08/25/2010    Expected Discharge Date: Expected Discharge Date:  (SNF)  Team Members Present: Physician leading conference: Dr. Alger Simons Social Worker Present: Lennart Pall, LCSW Nurse Present: Dorien Chihuahua, RN PT Present: Rada Hay, PT OT Present: Salome Spotted, OT;Other (comment);Jennifer Sidman, OT (Napoleon Form, OT) SLP Present: Weston Anna, SLP Other (Discipline and Name): Danne Baxter, RN [facility]) PPS Coordinator present : Daiva Nakayama, RN, CRRN     Current Status/Progress Goal Weekly Team Focus  Medical   right cerebellar infarct, urine retention, cognitive deficits  improve awareness, blanace  bladder emptying, cognition   Bowel/Bladder   Patient is continent of bowel and bladder. Occasional  I & O cathing due to retention.  Patient will no longer require I&O cathing  Encourage patient to void at least every 4 hours attempting to empty bladder.    Swallow/Nutrition/ Hydration   Dys. 3 textures and nectar-thick liquids with Min-Mod A for use of swallowing strategies   Supervision A  decrease impulsivity; MBS scheduled 4/20 for possible liquid upgrade   ADL's   min A-supervision UB and LB bathing; min-mod A functional transfers; decreased safety awareness/ need for assist; impulsice  Overall supervision  Safety; fine motor coordination; UE strengthening; standing balance   Mobility   min to mod A overall for transfers, gait, and stairs;  decreased safety awareness and impulsive  S w/c level; min A for gait and stairs  safety awareness, coordination, strength, endurance, balance, neuro re-ed/postural control, gait, stairs, d/c planning   Communication   Mod A for use of speech strategies for ~50% intelligibility at the phrase level  Min A for use of strategies for 75% intelligible at phrase level  increase use of speech strategies   Safety/Cognition/ Behavioral Observations  overall Min A; Mod A for safety/emergent awareness  Supervision A  increase awareness of deficits and safety awareness   Pain   Patient has no complaints of pain  maintain patients pain at or below a 3  Monitor patients pain every shift and prn   Skin   Patient has no noted skin breakdown  Remain free from skin breakdown while at rehab  continue to monitor patients skin every shift and prn    Rehab Goals Patient on target to meet rehab goals: Yes *See Care Plan and progress notes for long and short-term goals.  Barriers to Discharge: right sided ataxia, awareness/cognition    Possible Resolutions to Barriers:  continued NMR, cognitive rx    Discharge Planning/Teaching Needs:  plan changed to SNF as sister feels she cannot meet his 24/7 care  needs      Team Discussion:  Improving urine retention.  Plan MBS tomorrow.  Speech intelligibility still poor as well as cognition.  Currently min-mod assist overall with cuing.  SW reports plan changed to SNF as family cannot provide 24/7 care.  Revisions to Treatment Plan:  Change in d/c plan to SNF   Continued Need for Acute  Rehabilitation Level of Care: The patient requires daily medical management by a physician with specialized training in physical medicine and rehabilitation for the following conditions: Daily direction of a multidisciplinary physical rehabilitation program to ensure safe treatment while eliciting the highest outcome that is of practical value to the patient.: Yes Daily medical management of patient stability for increased activity during participation in an intensive rehabilitation regime.: Yes Daily analysis of laboratory values and/or radiology reports with any subsequent need for medication adjustment of medical intervention for : Neurological problems;Other  Posie Lillibridge 04/28/2014, 3:43 PM

## 2014-04-28 NOTE — Progress Notes (Signed)
Speech Language Pathology Weekly ProgressNote  Patient Details  Name: Hayden Mckinney MRN: [id number] Date of Birth: 10-Jul-1945  Beginning of progress report period: April 21, 2014 End of progress report period: April 28, 2014  Today's Date: 04/28/2014  Short Term Goals: Week 1: SLP Short Term Goal 1 (Week 1): Patient will consume least restrictive diet with Min A verbal and visual cues for use of swallow strategies. SLP Short Term Goal 1 - Progress (Week 1): Not met SLP Short Term Goal 2 (Week 1): Patient will consume trials of thin liquids with minimal overt s/s of aspiration in 90% of trials over 3 consecutive sessions. SLP Short Term Goal 2 - Progress (Week 1): Met SLP Short Term Goal 3 (Week 1): Patient will increase speech intelligibility to ~75% at the phrase level with Mod A visual and verbal cues for use of intelligibility strategies. SLP Short Term Goal 3 - Progress (Week 1): Not met SLP Short Term Goal 4 (Week 1): Patient will demonstrate mildly complex problem-solving in functional tasks given Min A visual and verbal cues in 75% of observable instances. SLP Short Term Goal 4 - Progress (Week 1): Not met SLP Short Term Goal 5 (Week 1): Patient will utilize external aid to recall new, daily information, such as diet restrictions, with Min A verbal and visual cues over 3 consecutive sessions. SLP Short Term Goal 5 - Progress (Week 1): Met SLP Short Term Goal 6 (Week 1): Patient will identify 2 new physical and 2 new cognitive deficits with Min A verbal and question cues over 3 consecutive sessions. SLP Short Term Goal 6 - Progress (Week 1): Met    New Short Term Goals: Week 2: SLP Short Term Goal 1 (Week 2): Patient will consume least restrictive diet with Min A verbal and visual cues for use of swallow strategies. SLP Short Term Goal 2 (Week 2): Patient will increase speech intelligibility to ~75% at the phrase level with Mod A visual and verbal cues for use of intelligibility  strategies. SLP Short Term Goal 3 (Week 2): Patient will demonstrate mildly complex problem-solving associated with functional tasks given Min A visual and verbal cues in 75% of observable instances. SLP Short Term Goal 4 (Week 2): Patient will utilize external aid to recall new, daily information, such as diet restrictions and intelligibility strategies, with Supervision A verbal and visual cues over 3 consecutive sessions. SLP Short Term Goal 5 (Week 2): Patient will monitor and self-correct errors relating to cognitive deficits during functional tasks with Min A verbal and question cues in 75% of observable instances.  Weekly Progress Updates: Patient has made functional progress this reporting period and has met 3 out of 6 STGs due to increased intellectual awareness and utilization of external aids to assist in recall, and decreased overt s/s of aspiration while consuming trials of thin liquids. Patient does continue to require Min-Mod A verbal cues for use of safe swallowing strategies, demonstrates impulsivity during PO intake, and reportedly has demonstrated limited compliance with diet recommendations (i.e. patient drinking thin liquid), however patient with increased swallowing function per MBS performed on 04/28/14 and subsequently upgraded to thin liquids via cup and straw. Patient furthermore requires Mod A verbal and visual cues for use of speech intelligibility strategies to achieve inconsistent intelligibility of 50-75% at the phrase level. Patient utilizes external aids to facilitate recall with Supervision A verbal and visual cues, requires Min A question cues for intellectual awareness, and requires Min-Mod A verbal and visual cues for mildly  complex problem solving. Patient would continue to benefit from skilled SLP intervention to maximize cognitive, swallowing, and speech functioning to maximize functional independence and decrease caregiver burden prior to discharge.   Intensity:  Minumum of 1-2 x/day, 30 to 90 minutes Frequency: 3 to 5 out of 7 days Duration/Length of Stay: TBD due to SNF placement Treatment/Interventions: Cognitive remediation/compensation;Cueing hierarchy;Dysphagia/aspiration precaution training;Environmental controls;Therapeutic Exercise;Therapeutic Activities;Patient/family education;Oral motor exercises;Functional tasks;Internal/external aids;Speech/Language facilitation     Servando Snare 04/28/2014, 12:04 PM

## 2014-04-28 NOTE — Progress Notes (Signed)
Physical Therapy Session Note  Patient Details  Name: Hayden Mckinney MRN: [id number] Date of Birth: 03-26-1945  Today's Date: 04/28/2014 PT Individual Time: 1000-1100 PT Individual Time Calculation (min): 60 min   Short Term Goals: Week 1:  PT Short Term Goal 1 (Week 1): Pt will be able to transfer with min A for basic transfers PT Short Term Goal 2 (Week 1): Pt will be able to demonstrate dynamic standing balance with min A PT Short Term Goal 3 (Week 1): Pt will be able to gait with mod A x 50' PT Short Term Goal 4 (Week 1): Pt will be able to propel w/c x 100' with S around obstacles  Skilled Therapeutic Interventions/Progress Updates:   Session focused on functional w/c propulsion, gait training with RW with focus on safe technique and obstacle negotiation to address balance and household mobility with min A and mod verbal cues for safe technique (pt able to independently recall cues I gave him the other day (staying inside RW and slowing down) but mod cues to put in action), and neuro re-ed for sit to stands and balance training. Neuro re-ed consisted of sit to stands without UE support with focus on control, sit to stands using 4" step for bias to RLE, lateral step ups on R, and standing balance on red wedge for balance reaction training and increased to blue wedge (sharpe angle) to stretch ankles (mod A needed for balance with and without UE support). Pt continues to demonstrate decreased safety awareness functionally despite being able to verbalize and follow directions. Handoff to OT in the room.   Therapy Documentation Precautions:  Precautions Precautions: Fall Restrictions Weight Bearing Restrictions: No  Pain: Denies pain.  See FIM for current functional status  Therapy/Group: Individual Therapy  Canary Brim Ivory Broad, PT, DPT  04/28/2014, 12:05 PM

## 2014-04-28 NOTE — Progress Notes (Signed)
Occupational Therapy Session Note  Patient Details  Name: Hayden Mckinney MRN: [id number] Date of Birth: [birth year]  Today's Date: 04/28/2014 OT Individual Time: 1300-1330 OT Individual Time Calculation (min): 30 min     Skilled Therapeutic Interventions/Progress Updates:    1:1 Pt finishing lunch when arrived. Pt slef propelled w/c to laundry room to transfer clothing from washer to dryer. Focus on sit to stand from w/c, short distance functional ambulation with RW with max A to steer RW to maintain safety and mod A for tactile cues for posture and balance (especially with turning 180 degrees.) After returning to w/c transitioned to gym to focus on sit <>stands from mat with control without UE support. Focus on maintaining a forward weight shift and avoiding hyperextension of knees and pushing LEs against mat to promote normal patterns of movement. Pt continues to require max cues for safety with mobility due to incoordination and impulsivity. Mod A stand pivot transfer back to w/c. Pt left at RN station.   Therapy Documentation Precautions:  Precautions Precautions: Fall Restrictions Weight Bearing Restrictions: No General:   Vital Signs:  Pain:  no c/o  pain in session   See FIM for current functional status  Therapy/Group: Individual Therapy  Willeen Cass [facility] 04/28/2014, 3:45 PM

## 2014-04-28 NOTE — Progress Notes (Signed)
Social Work Lowella Curb, [place] Social Worker Signed  Patient Care Conference 04/28/2014  3:43 PM    Expand All Collapse All   Inpatient RehabilitationTeam Conference and Plan of Care Update Date: 04/27/2014   Time: 2:30 PM     Patient Name: Hayden Mckinney       Medical Record Number: [id number]  Date of Birth: Aug 28, 1945 Sex: Male         Room/Bed: 4W08C/4W08C-01 Payor Info: Payor: MEDICARE / Plan: MEDICARE PART A AND B / Product Type: *No Product type* /    Admitting Diagnosis: R CEREBELLAR AND [place] INFARCTS   Admit Date/Time:  04/21/2014  3:37 PM Admission Comments: No comment available   Primary Diagnosis:  Cerebellar infarct Principal Problem: Cerebellar infarct    Patient Active Problem List     Diagnosis  Date Noted   .  Cerebellar infarct  04/21/2014   .  Aspiration pneumonia  04/20/2014   .  Acute kidney injury  04/20/2014   .  Speech abnormality     .  Stroke  04/16/2014   .  Embolic stroke  99991111   .  Type II diabetes mellitus with neurological manifestations  08/25/2010   .  HTN (hypertension)  08/25/2010   .  Hyperlipidemia  08/25/2010     Expected Discharge Date: Expected Discharge Date:  (SNF)  Team Members Present: Physician leading conference: Dr. Alger Simons Social Worker Present: Lennart Pall, LCSW Nurse Present: Dorien Chihuahua, RN PT Present: Rada Hay, PT OT Present: Salome Spotted, OT;Other (comment);Jennifer [place], OT (Napoleon Form, OT) SLP Present: Weston Anna, SLP Other (Discipline and Name): Danne Baxter, RN [facility]) PPS Coordinator present : Daiva Nakayama, RN, CRRN        Current Status/Progress  Goal  Weekly Team Focus   Medical     right cerebellar infarct, urine retention, cognitive deficits   improve awareness, blanace  bladder emptying, cognition   Bowel/Bladder     Patient is continent of bowel and bladder. Occasional  I & O cathing due to retention.  Patient will no longer require I&O cathing   Encourage patient to void at least  every 4 hours attempting to empty bladder.    Swallow/Nutrition/ Hydration     Dys. 3 textures and nectar-thick liquids with Min-Mod A for use of swallowing strategies  Supervision A  decrease impulsivity; MBS scheduled 4/20 for possible liquid upgrade   ADL's     min A-supervision UB and LB bathing; min-mod A functional transfers; decreased safety awareness/ need for assist; impulsice   Overall supervision  Safety; fine motor coordination; UE strengthening; standing balance   Mobility     min to mod A overall for transfers, gait, and stairs;  decreased safety awareness and impulsive  S w/c level; min A for gait and stairs   safety awareness, coordination, strength, endurance, balance, neuro re-ed/postural control, gait, stairs, d/c planning    Communication     Mod A for use of speech strategies for ~50% intelligibility at the phrase level  Min A for use of strategies for 75% intelligible at phrase level  increase use of speech strategies   Safety/Cognition/ Behavioral Observations    overall Min A; Mod A for safety/emergent awareness   Supervision A  increase awareness of deficits and safety awareness    Pain     Patient has no complaints of pain  maintain patients pain at or below a 3   Monitor patients pain every shift and prn  Skin     Patient has no noted skin breakdown   Remain free from skin breakdown while at rehab   continue to monitor patients skin every shift and prn    Rehab Goals Patient on target to meet rehab goals: Yes *See Care Plan and progress notes for long and short-term goals.    Barriers to Discharge:  right sided ataxia, awareness/cognition      Possible Resolutions to Barriers:   continued NMR, cognitive rx     Discharge Planning/Teaching Needs:   plan changed to SNF as sister feels she cannot meet his 24/7 care needs       Team Discussion:    Improving urine retention.  Plan MBS tomorrow.  Speech intelligibility still poor as well as cognition.   Currently min-mod assist overall with cuing.  SW reports plan changed to SNF as family cannot provide 24/7 care.   Revisions to Treatment Plan:    Change in d/c plan to SNF    Continued Need for Acute Rehabilitation Level of Care: The patient requires daily medical management by a physician with specialized training in physical medicine and rehabilitation for the following conditions: Daily direction of a multidisciplinary physical rehabilitation program to ensure safe treatment while eliciting the highest outcome that is of practical value to the patient.: Yes Daily medical management of patient stability for increased activity during participation in an intensive rehabilitation regime.: Yes Daily analysis of laboratory values and/or radiology reports with any subsequent need for medication adjustment of medical intervention for : Neurological problems;Other  [name] 04/28/2014, 3:43 PM

## 2014-04-28 NOTE — Progress Notes (Signed)
[facility] PHYSICAL MEDICINE & REHABILITATION     PROGRESS NOTE    Subjective/Complaints: Had a reasonable night. Denies pain.      Objective: Vital Signs: Blood pressure 151/56, pulse 74, temperature 98.2 F (36.8 C), temperature source Oral, resp. rate 17, weight 65.1 kg (143 lb 8.3 oz), SpO2 98 %. No results found. No results for input(s): WBC, HGB, HCT, PLT in the last 72 hours. No results for input(s): NA, K, CL, GLUCOSE, BUN, CREATININE, CALCIUM in the last 72 hours.  Invalid input(s): CO CBG (last 3)   Recent Labs  04/27/14 1647 04/27/14 2057 04/28/14 0647  GLUCAP 161* 188* 124*    Wt Readings from Last 3 Encounters:  04/27/14 65.1 kg (143 lb 8.3 oz)  04/16/14 67.1 kg (147 lb 14.9 oz)  12/14/11 76.613 kg (168 lb 14.4 oz)    Physical Exam:   Gen:no acute distress,  HENT:edentulous. Oral mucosa pink and moist Head: Normocephalic.  Eyes: EOM are normal.  Neck: Normal range of motion. Neck supple. No thyromegaly present.  Cardiovascular: Normal rate and regular rhythm.no murmurs or rubs  Respiratory: Effort normal and breath sounds normal. No respiratory distress. no wheezes GI: Soft. Bowel sounds are normal. He exhibits no distension. Non-tender Neurological: He is alert.  Speech is  dysarthric but fairly intelligibile. He is able to provide his name and age as well as date of birth. Followed simple commands. Right limb ataxia, right PD. RUE motor 4 to 4+/5 delt,bic,tri,wrist, hand. RLE: 4/5 hf,ke. 0 to trace ADF and 3-4/5 APF. Sensation intact to pain on right side Skin: Skin is warm and dry.  Psych: pt pleasant and appropriate  Assessment/Plan: 1. 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 Physiatrist is providing close team supervision and 24 hour management of active medical problems listed  below. Physiatrist and rehab team continue to assess barriers to discharge/monitor patient progress toward functional and medical goals.  Seeking placement given projected care needs/dispo  FIM: FIM - Bathing Bathing Steps Patient Completed: Chest, Right Arm, Left Arm, Abdomen, Front perineal area, Right upper leg, Left upper leg, Left lower leg (including foot), Right lower leg (including foot), Buttocks Bathing: 5: Supervision: Safety issues/verbal cues  FIM - Upper Body Dressing/Undressing Upper body dressing/undressing steps patient completed: Pull shirt over trunk, Thread/unthread right sleeve of pullover shirt/dresss, Thread/unthread left sleeve of pullover shirt/dress, Put head through opening of pull over shirt/dress Upper body dressing/undressing: 5: Set-up assist to: Obtain clothing/put away FIM - Lower Body Dressing/Undressing Lower body dressing/undressing steps patient completed: Pull pants up/down, Thread/unthread left pants leg, Thread/unthread right underwear leg, Thread/unthread left underwear leg, Thread/unthread right pants leg, Pull underwear up/down, Fasten/unfasten pants, Don/Doff right sock, Don/Doff left sock Lower body dressing/undressing: 4: Steadying Assist  FIM - Toileting Toileting steps completed by patient: Performs perineal hygiene (cannot undo jeans or unzip pants solo) Geologist, engineering Devices: Grab bar or rail for support Toileting: 2: Max-Patient completed 1 of 3 steps  FIM - Radio producer Devices: Grab bars Toilet Transfers: 3-To toilet/BSC: Mod A (lift or lower assist), 4-From toilet/BSC: Min A (steadying Pt. > 75%)  FIM - Bed/Chair Transfer Bed/Chair Transfer Assistive Devices: Arm rests Bed/Chair Transfer: 4: Chair or W/C > Bed: Min A (steadying Pt. > 75%), 4: Bed > Chair or W/C: Min A (steadying Pt. > 75%)  FIM - Locomotion: Wheelchair Distance: 160 Locomotion: Wheelchair: 5: [employer]  150 ft or more: maneuvers on  rugs and over door sills with supervision, cueing or coaxing FIM - Locomotion: Ambulation Locomotion: Ambulation Assistive Devices: Walker - Rolling Ambulation/Gait Assistance: 3: Mod assist, 4: Min assist Locomotion: Ambulation: 2: Travels 50 - 149 ft with moderate assistance (Pt: 50 - 74%)  Comprehension Comprehension Mode: Auditory Comprehension: 5-Understands basic 90% of the time/requires cueing < 10% of the time  Expression Expression Mode: Verbal Expression: 3-Expresses basic 50 - 74% of the time/requires cueing 25 - 50% of the time. Needs to repeat parts of sentences.  Social Interaction Social Interaction: 4-Interacts appropriately 75 - 89% of the time - Needs redirection for appropriate language or to initiate interaction.  Problem Solving Problem Solving: 4-Solves basic 75 - 89% of the time/requires cueing 10 - 24% of the time  Memory Memory: 5-Recognizes or recalls 90% of the time/requires cueing < 10% of the time  Medical Problem List and Plan: 1. 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 DVT Prophylaxis/Anticoagulation: Subcutaneous Lovenox. Monitor platelet counts and any signs of bleeding. Venous Dopplers negative 3. Pain Management: Lyrica 50 mg twice a day Tylenol as needed 4. Hypertension. Coreg 6.25 mg twice a day,BIDIL 20-30 7.5 mg 3 times a day. Monitor with increased mobility  5. Neuropsych: This patient is capable of making decisions on his own behalf. 6. Skin/Wound Care: Routine skin checks  7. Fluids/Electrolytes/Nutrition: encourage adequate po 8. Diabetes mellitus with peripheral neuropathy. Latest hemoglobin A1c 7.9. Presently with sliding scale insulin. Patient on Glucophage 1000 mg twice a day prior to admission--> Will hold given Cr.   -amaryl 2mg  on board--sugars improving 9. Question left lower lobe pneumonia. abx stopped. afebrile 10. Acute on chronic renal insufficiency. Cr around  1.5  -neurogenic bladder/urine retention---ua neg, culture negative   -added flomax   -volumes are better 11. Dysphagia. Diet changed to mechanical soft nectar liquids after modified barium swallow 04/21/2014.    -tolerating thus far   LOS (Days) 7 A FACE TO FACE EVALUATION WAS PERFORMED  Marsha Gundlach T 04/28/2014 8:20 AM

## 2014-04-28 NOTE — Progress Notes (Signed)
Occupational Therapy Session Note  Patient Details  Name: Hayden Mckinney MRN: [id number] Date of Birth: 08-05-1945  Today's Date: 04/28/2014 OT Individual Time: 1100-1200 OT Individual Time Calculation (min): 60 min    Short Term Goals: Week 1:  OT Short Term Goal 1 (Week 1): Pt will complete toilet transfers with min A OT Short Term Goal 2 (Week 1): Pt will complete shower transfers with min A OT Short Term Goal 3 (Week 1): Pt will dress LB with min A OT Short Term Goal 4 (Week 1): Pt will bathe 10/10 body parts with steadying assist OT Short Term Goal 5 (Week 1): Pt will maintain functional static standing balance with min A to complete grooming task  Skilled Therapeutic Interventions/Progress Updates:    Pt seen for OT ADL bathing and dressing session. Pt in w/c upon arrival in room with PT present. Pt denied showering task, however agreeable to wash at the sink. Pt completed UB bathing with set-up and supervision for safety from w/c level. He stood at the sink to pull pants down and doffed pants with supervision. Pt washed B LEs with supervision. Pt donned hospital gown and B socks with set-up. Pt completed grooming task seated at the sink. Pt cut R index finger on razor when attempting to remove razor cap. Finger bled extensively, RN made aware and bandage applied. Pt required cues for safety to complete grooming task. Pt then completed laundering task, able to state steps needed for using laundry machine. Pt self propelled w/c throughout unit with B UEs and LEs to focus on strengthening and endurance. Pt required max VCs to attend to signage on wall to locate patient laundry facility. Pt asked to remember to remove clothes from washer during next OT session. Pt required cues throughout session for safety and to slow down as he is impulsive with all activity.    Pt left at nurses station at end of session for supervision. Safety belt donned.   Therapy Documentation Precautions:   Precautions Precautions: Fall Restrictions Weight Bearing Restrictions: No Pain: Pain Assessment Pain Assessment: No/denies pain  See FIM for current functional status  Therapy/Group: Individual Therapy  [name], Barett Whidbee C 04/28/2014, 12:01 PM

## 2014-04-29 ENCOUNTER — Inpatient Hospital Stay (HOSPITAL_COMMUNITY): Payer: Medicare Other | Admitting: Speech Pathology

## 2014-04-29 ENCOUNTER — Inpatient Hospital Stay (HOSPITAL_COMMUNITY): Payer: Medicare Other | Admitting: Occupational Therapy

## 2014-04-29 ENCOUNTER — Inpatient Hospital Stay (HOSPITAL_COMMUNITY): Payer: Medicare Other

## 2014-04-29 ENCOUNTER — Ambulatory Visit: Payer: Medicare Other

## 2014-04-29 LAB — GLUCOSE, CAPILLARY
GLUCOSE-CAPILLARY: 139 mg/dL — AB (ref 70–99)
Glucose-Capillary: 67 mg/dL — ABNORMAL LOW (ref 70–99)
Glucose-Capillary: 68 mg/dL — ABNORMAL LOW (ref 70–99)
Glucose-Capillary: 78 mg/dL (ref 70–99)
Glucose-Capillary: 99 mg/dL (ref 70–99)
Glucose-Capillary: 118 mg/dL — ABNORMAL HIGH (ref 70–99)

## 2014-04-29 MED ORDER — GLIMEPIRIDE 1 MG PO TABS
1.0000 mg | ORAL_TABLET | Freq: Every day | ORAL | Status: DC
  Administered 2014-04-30 – 2014-05-04 (×5): 1 mg via ORAL
  Filled 2014-04-29 (×6): qty 1

## 2014-04-29 NOTE — Progress Notes (Signed)
Physical Therapy Weekly Progress Note  Patient Details  Name: Hayden Mckinney MRN: [id number] Date of Birth: 1945/11/24  Beginning of progress report period: April 22, 2014 End of progress report period: April 29, 2014  Today's Date: 04/29/2014 PT Individual Time: 1300-1430 PT Individual Time Calculation (min): 90 min  Denies pain. Session focused on functional w/c propulsion with BUE and BLE to address functional mobility as well as coordination, gait training with RW in controlled and dynamic setting to locate and pick up hidden objects from various heights with min to mod A and mod verbal cues for safety, neuro re-ed for dynamic balance training with various activities while performing dual cognitive task, stair negotiation up/down 10 steps with bilateral rails x 2 trials with min a and cues for technique, stretching to bilateral heel cords in standing on wedge, toileting in pt room with overall min A and cues for safety, and Nustep for BUE and BLE strengthening and endurance x 10 min on level 4. Pt required overall mod verbal cues for safety throughout session. Pt also requires verbal cues for management of RW during mobility especially in distracting environment requiring at times mod A for balance.     Patient has met 4 of 4 short term goals.  Pt is making functional progress with mobility but continues to be limited by decreased safety awareness and insight into deficits. Pt's sister had come in for family education and discharge plan determined to be SNF due to pt's required level of assist. Recommend 24/7 close S to hands on A for mobility at this time.   Patient continues to demonstrate the following deficits: decreased strength, decreased coordination, decreased balance, impaired cognition, decreased safety awareness, decreased functional mobility, decreased postural control and therefore will continue to benefit from skilled PT intervention to enhance overall performance with activity  tolerance, balance, postural control, ability to compensate for deficits, functional use of  right upper extremity and right lower extremity, awareness and coordination.  Patient progressing toward long term goals..  Continue plan of care.   PT Short Term Goals Week 1:  PT Short Term Goal 1 (Week 1): Pt will be able to transfer with min A for basic transfers PT Short Term Goal 1 - Progress (Week 1): Met PT Short Term Goal 2 (Week 1): Pt will be able to demonstrate dynamic standing balance with min A PT Short Term Goal 2 - Progress (Week 1): Met (goal met with UE support) PT Short Term Goal 3 (Week 1): Pt will be able to gait with mod A x 50' PT Short Term Goal 3 - Progress (Week 1): Met PT Short Term Goal 4 (Week 1): Pt will be able to propel w/c x 100' with S around obstacles PT Short Term Goal 4 - Progress (Week 1): Met Week 2:  PT Short Term Goal 1 (Week 2): = LTGs  Skilled Therapeutic Interventions/Progress Updates:      Therapy Documentation Precautions:  Precautions Precautions: Fall Restrictions Weight Bearing Restrictions: No  See FIM for current functional status  Therapy/Group: Individual Therapy  Canary Brim Ivory Broad, PT, DPT  04/29/2014, 3:38 PM

## 2014-04-29 NOTE — Progress Notes (Signed)
Speech Language Pathology Daily Session Note  Patient Details  Name: Hayden Mckinney MRN: [id number] Date of Birth: Aug 21, 1945  Today's Date: 04/29/2014 SLP Individual Time: 1130-1200 SLP Individual Time Calculation (min): 30 min  Short Term Goals: Week 2: SLP Short Term Goal 1 (Week 2): Patient will consume least restrictive diet with Min A verbal and visual cues for use of swallow strategies. SLP Short Term Goal 2 (Week 2): Patient will increase speech intelligibility to ~75% at the phrase level with Mod A visual and verbal cues for use of intelligibility strategies. SLP Short Term Goal 3 (Week 2): Patient will demonstrate mildly complex problem-solving associated with functional tasks given Min A visual and verbal cues in 75% of observable instances. SLP Short Term Goal 4 (Week 2): Patient will utilize external aid to recall new, daily information, such as diet restrictions and intelligibility strategies, with Supervision A verbal and visual cues over 3 consecutive sessions. SLP Short Term Goal 5 (Week 2): Patient will monitor and self-correct errors relating to cognitive deficits during functional tasks with Min A verbal and question cues in 75% of observable instances.  Skilled Therapeutic Interventions: Skilled treatment session focused on cognitive goals. SLP facilitated session by providing Mod A verbal and question cues for utilization of speech intelligibility strategies at the phrase level. Patient was ~50% intelligible while describing a familiar card game to this clinician. Patient demonstrated selective attention to task for ~25 minutes with Mod I.  At end of session, patient initiated lunch meal of Dys. 3 textures with thin liquids. Patient did not demonstrate any overt s/s of aspiration but required Max A multimodal cues for utilization of swallowing compensatory strategies. Patient handed off to NT to finish full supervision. Continue with current plan of care.    FIM:   Comprehension Comprehension Mode: Auditory Comprehension: 5-Understands complex 90% of the time/Cues < 10% of the time Expression Expression Mode: Verbal Expression: 3-Expresses basic 50 - 74% of the time/requires cueing 25 - 50% of the time. Needs to repeat parts of sentences. Social Interaction Social Interaction: 4-Interacts appropriately 75 - 89% of the time - Needs redirection for appropriate language or to initiate interaction. Problem Solving Problem Solving: 4-Solves basic 75 - 89% of the time/requires cueing 10 - 24% of the time Memory Memory: 4-Recognizes or recalls 75 - 89% of the time/requires cueing 10 - 24% of the time FIM - Eating Eating Activity: 5: Supervision/cues  Pain Pain Assessment Pain Assessment: No/denies pain  Therapy/Group: Individual Therapy  Sheyann Sulton 04/29/2014, 4:24 PM

## 2014-04-29 NOTE — Progress Notes (Signed)
Occupational Therapy Session Note  Patient Details  Name: BRIN SCATURRO MRN: [id number] Date of Birth: [birth year]  Today's Date: 04/29/2014 OT Individual Time: 1500-1530 OT Individual Time Calculation (min): 30 min    Short Term Goals: Week 2:  OT Short Term Goal 1 (Week 2): STG=LTG  Skilled Therapeutic Interventions/Progress Updates:    Pt seen for OT session focusing on functional activity tolerance and cognitive retraining/ attention to task. Pt in w/c at nursing station upon arrival, agreeable to tx. Pt self propelled w/c to therapy gym. He stood at table to complete simple jig saw puzzle. He stood~3 minutes before requiring seated rest break after R knee buckling. Pt sat during rest breaks to cont to working on puzzle. Following ~20 minutes of working on puzzle and able to complete 6 pieces of 20 piece puzzle with VCs for functional problem solving. Pt returned to nurses station with safety belt donned at end of session.    Education provided regarding need for assist, safety awareness, and functional problem solving techniques  Therapy Documentation Precautions:  Precautions Precautions: Fall Restrictions Weight Bearing Restrictions: No Pain: Pain Assessment Pain Assessment: No/denies pain  See FIM for current functional status  Therapy/Group: Individual Therapy  [name], Nashia Remus C 04/29/2014, 3:54 PM

## 2014-04-29 NOTE — Progress Notes (Signed)
Hypoglycemic Event  CBG: 67  Treatment: Coke 4oz  Symptoms: None  Follow-up CBG: Time:0713 CBG Result:78  Possible Reasons for Event: Inadequate meal intake  Comments/MD notified:Dan Anguilli,PA Order HS snack    Lourena Simmonds  Remember to initiate Hypoglycemia Order Set & complete

## 2014-04-29 NOTE — Progress Notes (Signed)
Occupational Therapy Weekly Progress Note  Patient Details  Name: Hayden Mckinney MRN: [id number] Date of Birth: 1945/04/26  Beginning of progress report period: April 22, 2014 End of progress report period: April 29, 2014  Today's Date: 04/29/2014 OT Individual Time: 0722-5750 OT Individual Time Calculation (min): 45 min    Patient has met 5 of 5 short term goals.    Patient continues to demonstrate the following deficits: decreased functional standing balance; increased assist for functional transfers; impaired cognition; decreased insight into deficits; decreased functional activity tolerance and therefore will continue to benefit from skilled OT intervention to enhance overall performance with BADL, iADL and Reduce care partner burden.  Patient progressing toward long term goals..  Continue plan of care.  OT Short Term Goals Week 1:  OT Short Term Goal 1 (Week 1): Pt will complete toilet transfers with min A OT Short Term Goal 1 - Progress (Week 1): Met OT Short Term Goal 2 (Week 1): Pt will complete shower transfers with min A OT Short Term Goal 2 - Progress (Week 1): Met OT Short Term Goal 3 (Week 1): Pt will dress LB with min A OT Short Term Goal 3 - Progress (Week 1): Met OT Short Term Goal 4 (Week 1): Pt will bathe 10/10 body parts with steadying assist OT Short Term Goal 4 - Progress (Week 1): Met OT Short Term Goal 5 (Week 1): Pt will maintain functional static standing balance with min A to complete grooming task OT Short Term Goal 5 - Progress (Week 1): Met Week 2:  OT Short Term Goal 1 (Week 2): STG=LTG  Skilled Therapeutic Interventions/Progress Updates:    Pt seen for OT session focusing on ADL retraining, functional activity tolerance, and fine motor manipulation. Pt in w/c at nurses station upon arrival, agreeable to tx. Pt self propelled w/c to room using B LEs In room, pt gathered clothing items from w/c level with supervision for safety. He dressed from w/c  level, requiring steadying assist and supervision throughout for safety due to impulsivity and decreased dynamic balance. Pt dressed UB with supervision, and stood at sink to pull pants up. Pt able to don B shoes and socks with increased time and supervision and VCs for safety awareness.  Pt declined bathing task, and completed grooming tasks seated at the sink. Pt the self propelled to therapy gym using B LEs with VCs for w/c management and use. In therapy gym, Pt transferred onto/ off of Nu Step with max assist and VCs for sequencing as pt demonstrates ataxic movements in B LEs during functional mobility and transfers. On NuStep pt completed 2 min at level six and 5 min at level 4 using B UEs and LEs in order to increase functional activity tolerance and increased functional strength. Pt then completed 9 hole peg test with results listed below.  Pt provided with ultra soft theraputty and educated on use and exercises. Pt taken to nurses station at end of session for supervision with safety belt donned and manipulating theraputty.    9 Hole Peg Test:  R: 2 min 43 seconds L: 1 min 47 seconds  Therapy Documentation Precautions:  Precautions Precautions: Fall Restrictions Weight Bearing Restrictions: No Pain: Pain Assessment Pain Assessment: No/denies pain  See FIM for current functional status  Therapy/Group: Individual Therapy  [name], Shalah Estelle C 04/29/2014, 7:15 AM

## 2014-04-29 NOTE — Progress Notes (Signed)
[facility] PHYSICAL MEDICINE & REHABILITATION     PROGRESS NOTE    Subjective/Complaints: No new complaints. Pain controlled. Slept well.       Objective: Vital Signs: Blood pressure 145/60, pulse 75, temperature 98.2 F (36.8 C), temperature source Oral, resp. rate 17, weight 65.1 kg (143 lb 8.3 oz), SpO2 95 %. Dg Swallowing Func-speech Pathology  04/28/2014    Objective Swallowing Evaluation:    Patient Details  Name: Hayden Mckinney MRN: [id number] Date of Birth: July 20, 1945  Today's Date: 04/28/2014 Time: SLP Start Time (ACUTE ONLY): 0900-SLP Stop Time (ACUTE ONLY): 0930 SLP Time Calculation (min) (ACUTE ONLY): 30 min  Past Medical History:  Past Medical History  Diagnosis Date  . Type II diabetes mellitus   . Sarcoma     a. R leg  . Diabetic peripheral neuropathy     a. both feet  . TIA (transient ischemic attack)     a. 08/2010.  Marland Kitchen Hyperlipidemia   . Hypertension   . Stroke     a. 04/2014 multiple bateral infarcts, presumed to be embolic;  b. [id number]  Carotid U/S: 1-39% bilat ICA stenosis.  . H/O echocardiogram     a. 04/2014 Echo: EF 55-60%, no rwma.  Marland Kitchen ETOH abuse     a. 04/2014 18-24 beers q weekend.   Past Surgical History:  Past Surgical History  Procedure Laterality Date  . Sarcoma removal    . Repair of r hand after trauma    . Loop recorder implant N/A 04/20/2014    Procedure: LOOP RECORDER IMPLANT;  Surgeon: Thompson Grayer, MD;  Location:  [facility] CATH LAB;  Service: Cardiovascular;  Laterality: N/A;  . Tee without cardioversion N/A 04/20/2014    Procedure: TRANSESOPHAGEAL ECHOCARDIOGRAM (TEE);  Surgeon: Larey Dresser, MD;  Location: [facility] ENDOSCOPY;  Service: Cardiovascular;   Laterality: N/A;   HPI:  HPI: 69 y.o. male, with past medical history significant for hypertension  and diabetes and TIAs in the past presenting with 2 weeks history of  increasing weakness, falls and slurred speech that was noted today by his  sister who was visiting him. MRI of the brain shows moderate size acute  subacute  nonhemorrhagic infarct mid to superior right cerebellar with  local mass effect as well as tiny acute nonhemorrhagic posterior right  operculum infarct, subacute to remote partially hemorrhagic infarct right  lenticular nucleus mid right coronal radiata and remote bilateral thalamic  and anterior left external capsule infarct. Patient had MBS on 4/13 and  recommended Dys. 3 textures with nectar-thick liquids due to silent  aspiration of thin liquids. Patient transferred to CIR on 4/15 and has  been participating in dysphagia treatment. Repeat MBS today to assess for  possible upgrade.   No Data Recorded  Assessment / Plan / Recommendation CHL IP CLINICAL IMPRESSIONS 04/28/2014  Dysphagia Diagnosis Mild oral phase dysphagia;Mild pharyngeal phase  dysphagia  Clinical impression Pt has a mild oropharyngeal dysphagia with decreased  lingual manipulation and trace base of tongue residue with piecemeal  swallowing with Dys. 1 and Dys. 3 textures. Patient also has prolonged  mastication due to being edentulous. Patient has a delayed swallow  initiation resulting in frequent flash penetration of thin liquids.  Patient also demonstrates trace vallecular residue that cleared with a  cued second swallow.  Although patient demonstrates flash penetration with  thin liquids, patient has been consuming trials of thin liquids with SLP  and independently from his sink (per patient report) and appears to be  tolerating it without any adverse effects at this time, therefore,   recommend patient upgrade to thin liquids and continue full supervision to  maximize overall swallowing safety.       CHL IP TREATMENT RECOMMENDATION 04/28/2014  Treatment Plan Recommendations Therapy as outlined in treatment plan below      CHL IP DIET RECOMMENDATION 04/28/2014  Diet Recommendations Dysphagia 3 (Mechanical Soft);Thin liquid  Liquid Administration via Cup;Straw  Medication Administration Whole meds with puree  Compensations Small sips/bites;Check  for pocketing;Slow rate  Postural Changes and/or Swallow Maneuvers Seated upright 90  degrees;Upright 30-60 min after meal     CHL IP OTHER RECOMMENDATIONS 04/28/2014  Recommended Consults (None)  Oral Care Recommendations Oral care BID  Other Recommendations (None)     CHL IP FOLLOW UP RECOMMENDATIONS 04/28/2014  Follow up Recommendations 24 hour supervision/assistance;Skilled Nursing  facility     CHL IP FREQUENCY AND DURATION 04/21/2014  Speech Therapy Frequency (ACUTE ONLY) min 2x/week  Treatment Duration 2 weeks     Pertinent Vitals/Pain N/A    SLP Swallow Goals No flowsheet data found.  No flowsheet data found.    CHL IP REASON FOR REFERRAL 04/28/2014  Reason for Referral Objectively evaluate swallowing function     CHL IP ORAL PHASE 04/28/2014  Lips (None)  Tongue (None)  Mucous membranes (None)  Nutritional status (None)  Other (None)  Oxygen therapy (None)  Oral Phase Impaired  Oral - Pudding Teaspoon (None)  Oral - Pudding Cup (None)  Oral - Honey Teaspoon (None)  Oral - Honey Cup (None)  Oral - Honey Syringe (None)  Oral - Nectar Teaspoon (None) Oral - Nectar Cup Weak lingual  manipulation;Lingual/palatal residue Oral - Nectar Straw NT  Oral - Nectar Syringe (None)  Oral - Ice Chips (None)  Oral - Thin Teaspoon (None)  Oral - Thin Cup Weak lingual manipulation;Lingual/palatal residue  Oral - Thin Straw Weak lingual manipulation;Lingual/palatal residue  Oral - Thin Syringe (None)  Oral - Puree Weak lingual manipulation;Lingual/palatal residue;Piecemeal  swallowing  Oral - Mechanical Soft Impaired mastication;Weak lingual  manipulation;Lingual/palatal residue;Piecemeal swallowing  Oral - Regular (None)  Oral - Multi-consistency (None)  Oral - Pill NT  Oral Phase - Comment (None)      CHL IP PHARYNGEAL PHASE 04/28/2014  Pharyngeal Phase Impaired  Pharyngeal - Pudding Teaspoon (None)  Penetration/Aspiration details (pudding teaspoon) (None)  Pharyngeal - Pudding Cup (None)  Penetration/Aspiration details (pudding  cup) (None)  Pharyngeal - Honey Teaspoon (None)  Penetration/Aspiration details (honey teaspoon) (None)  Pharyngeal - Honey Cup (None)  Penetration/Aspiration details (honey cup) (None)  Pharyngeal - Honey Syringe (None)  Penetration/Aspiration details (honey syringe) (None)  Pharyngeal - Nectar Teaspoon (None)  Penetration/Aspiration details (nectar teaspoon) (None)  Pharyngeal - Nectar Cup Delayed swallow initiation;Pharyngeal residue -  valleculae;Compensatory strategies attempted (Comment)  Penetration/Aspiration details (nectar cup) (None)  Pharyngeal - Nectar Straw NT  Penetration/Aspiration details (nectar straw) (None)  Pharyngeal - Nectar Syringe (None)  Penetration/Aspiration details (nectar syringe) (None)  Pharyngeal - Ice Chips (None)  Penetration/Aspiration details (ice chips) (None)  Pharyngeal - Thin Teaspoon (None)  Penetration/Aspiration details (thin teaspoon) (None)  Pharyngeal - Thin Cup Delayed swallow initiation;Pharyngeal residue -  valleculae  Penetration/Aspiration details (thin cup) Material enters airway, remains  ABOVE vocal cords then ejected out  Pharyngeal - Thin Straw Delayed swallow initiation;Pharyngeal residue -  valleculae  Penetration/Aspiration details (thin straw) Material enters airway,  remains ABOVE vocal cords then ejected out  Pharyngeal - Thin Syringe (None)  Penetration/Aspiration details (thin  syringe') (None)  Pharyngeal - Puree Delayed swallow initiation  Penetration/Aspiration details (puree) (None)  Pharyngeal - Mechanical Soft Delayed swallow initiation  Penetration/Aspiration details (mechanical soft) (None)  Pharyngeal - Regular (None)  Penetration/Aspiration details (regular) (None)  Pharyngeal - Multi-consistency (None)  Penetration/Aspiration details (multi-consistency) (None)  Pharyngeal - Pill NT  Penetration/Aspiration details (pill) (None)  Pharyngeal Comment (None)     CHL IP CERVICAL ESOPHAGEAL PHASE 04/28/2014  Cervical Esophageal Phase WFL  Pudding  Teaspoon (None)  Pudding Cup (None)  Honey Teaspoon (None)  Honey Cup (None)  Honey Syringe (None)  Nectar Teaspoon (None)  Nectar Cup (None)  Nectar Straw (None)  Nectar Syringe (None)  Thin Teaspoon (None)  Thin Cup (None)  Thin Straw (None)  Thin Syringe (None)  Cervical Esophageal Comment (None)    No flowsheet data found.         PAYNE, Berkley 04/28/2014, 10:21 AM    No results for input(s): WBC, HGB, HCT, PLT in the last 72 hours. No results for input(s): NA, K, CL, GLUCOSE, BUN, CREATININE, CALCIUM in the last 72 hours.  Invalid input(s): CO CBG (last 3)   Recent Labs  04/29/14 0647 04/29/14 0702 04/29/14 0713  GLUCAP 67* 68* 78    Wt Readings from Last 3 Encounters:  04/27/14 65.1 kg (143 lb 8.3 oz)  04/16/14 67.1 kg (147 lb 14.9 oz)  12/14/11 76.613 kg (168 lb 14.4 oz)    Physical Exam:   Gen:no acute distress,  HENT:edentulous. Oral mucosa pink and moist Head: Normocephalic.  Eyes: EOM are normal.  Neck: Normal range of motion. Neck supple. No thyromegaly present.  Cardiovascular: Normal rate and regular rhythm.no murmurs or rubs  Respiratory: Effort normal and breath sounds normal. No respiratory distress. no wheezes GI: Soft. Bowel sounds are normal. He exhibits no distension. Non-tender Neurological: He is alert.  Speech is  dysarthric but somewhat intelligibile. He is able to provide his name and age as well as date of birth. Followed simple commands. Right limb ataxia persistent, right PD. RUE motor 4 to 4+/5 delt,bic,tri,wrist, hand. RLE: 4/5 hf,ke. 0 to trace ADF and 3-4/5 APF. Sensation intact to pain on right side Skin: Skin is warm and dry.  Psych: pt pleasant and appropriate  Assessment/Plan: 1. 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 Physiatrist is providing close team supervision and 24  hour management of active medical problems listed below. Physiatrist and rehab team continue to assess barriers to discharge/monitor patient progress toward functional and medical goals.  Seeking placement given projected care needs/dispo  FIM: FIM - Bathing Bathing Steps Patient Completed: Chest, Right Arm, Left Arm, Abdomen, Right upper leg, Left upper leg, Left lower leg (including foot), Right lower leg (including foot) Bathing: 5: Supervision: Safety issues/verbal cues  FIM - Upper Body Dressing/Undressing Upper body dressing/undressing steps patient completed: Pull shirt over trunk, Thread/unthread right sleeve of pullover shirt/dresss, Thread/unthread left sleeve of pullover shirt/dress, Put head through opening of pull over shirt/dress Upper body dressing/undressing: 5: Set-up assist to: Obtain clothing/put away FIM - Lower Body Dressing/Undressing Lower body dressing/undressing steps patient completed: Don/Doff right sock, Don/Doff left sock Lower body dressing/undressing: 0: Activity did not occur  FIM - Toileting Toileting steps completed by patient: Adjust clothing prior to toileting Toileting Assistive Devices: Grab bar or rail for support Toileting: 4: Steadying assist  FIM - Radio producer Devices: Product manager  Transfers: 4-To toilet/BSC: Min A (steadying Pt. > 75%), 4-From toilet/BSC: Min A (steadying Pt. > 75%)  FIM - Bed/Chair Transfer Bed/Chair Transfer Assistive Devices: Arm rests Bed/Chair Transfer: 4: Chair or W/C > Bed: Min A (steadying Pt. > 75%), 4: Bed > Chair or W/C: Min A (steadying Pt. > 75%)  FIM - Locomotion: Wheelchair Distance: 160 Locomotion: Wheelchair: 5: Travels 150 ft or more: maneuvers on rugs and over door sills with supervision, cueing or coaxing FIM - Locomotion: Ambulation Locomotion: Ambulation Assistive Devices: Administrator Ambulation/Gait Assistance: 4: Min assist Locomotion: Ambulation: 2: Travels  50 - 149 ft with minimal assistance (Pt.>75%)  Comprehension Comprehension Mode: Auditory Comprehension: 5-Understands complex 90% of the time/Cues < 10% of the time  Expression Expression Mode: Verbal Expression: 3-Expresses basic 50 - 74% of the time/requires cueing 25 - 50% of the time. Needs to repeat parts of sentences.  Social Interaction Social Interaction: 4-Interacts appropriately 75 - 89% of the time - Needs redirection for appropriate language or to initiate interaction.  Problem Solving Problem Solving: 4-Solves basic 75 - 89% of the time/requires cueing 10 - 24% of the time  Memory Memory: 5-Recognizes or recalls 90% of the time/requires cueing < 10% of the time  Medical Problem List and Plan: 1. 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 DVT Prophylaxis/Anticoagulation: Subcutaneous Lovenox. Monitor platelet counts and any signs of bleeding. Venous Dopplers negative 3. Pain Management: Lyrica 50 mg twice a day Tylenol as needed 4. Hypertension. Coreg 6.25 mg twice a day,BIDIL 20-30 7.5 mg 3 times a day. Monitor with increased mobility  5. Neuropsych: This patient is capable of making decisions on his own behalf. 6. Skin/Wound Care: Routine skin checks  7. Fluids/Electrolytes/Nutrition: encourage adequate po 8. Diabetes mellitus with peripheral neuropathy. Latest hemoglobin A1c 7.9. Presently with sliding scale insulin. Patient on Glucophage 1000 mg twice a day prior to admission--> Will hold given Cr.   -amaryl 2mg  on board--sugars low--decrease to 1mg  9. Question left lower lobe pneumonia. abx stopped. afebrile 10. Acute on chronic renal insufficiency. Cr around 1.5  -neurogenic bladder/urine retention---ua neg, culture negative   -added flomax   -volumes are better 11. Dysphagia. Diet changed to mechanical soft nectar liquids after modified barium swallow 04/21/2014.    -tolerating thus far   LOS (Days) 8 A  FACE TO FACE EVALUATION WAS PERFORMED  SWARTZ,ZACHARY T 04/29/2014 8:20 AM

## 2014-04-30 ENCOUNTER — Inpatient Hospital Stay (HOSPITAL_COMMUNITY): Payer: Medicare Other | Admitting: Speech Pathology

## 2014-04-30 ENCOUNTER — Inpatient Hospital Stay (HOSPITAL_COMMUNITY): Payer: Medicare Other

## 2014-04-30 ENCOUNTER — Inpatient Hospital Stay (HOSPITAL_COMMUNITY): Payer: Medicare Other | Admitting: *Deleted

## 2014-04-30 ENCOUNTER — Inpatient Hospital Stay (HOSPITAL_COMMUNITY): Payer: Medicare Other | Admitting: Occupational Therapy

## 2014-04-30 LAB — GLUCOSE, CAPILLARY
Glucose-Capillary: 127 mg/dL — ABNORMAL HIGH (ref 70–99)
Glucose-Capillary: 136 mg/dL — ABNORMAL HIGH (ref 70–99)
Glucose-Capillary: 153 mg/dL — ABNORMAL HIGH (ref 70–99)
Glucose-Capillary: 119 mg/dL — ABNORMAL HIGH (ref 70–99)

## 2014-04-30 NOTE — Progress Notes (Signed)
Occupational Therapy Session Note  Patient Details  Name: Hayden Mckinney MRN: [id number] Date of Birth: 07/17/1945  Today's Date: 04/30/2014 OT Individual Time: 0930-1030 OT Individual Time Calculation (min): 60 min    Short Term Goals: Week 2:  OT Short Term Goal 1 (Week 2): STG=LTG  Skilled Therapeutic Interventions/Progress Updates:    Pt seen for OT ADl bathing and dressing session. Pt at nurses station in w/c upon arrival, agreeable to tx. Pt propelled self throughout unit with supervision and cues for safety to obtain towels and soap. He then went to room where he gathered clothing from w/c level with supervision for safety. Pt transferred w/c> 3-1BSC in walk in shower via stand pivot transfer with steadying assist and VCs for hand placement on railing. Pt bathed seated with supervision. Following shower, pt voiced need for toileting task. Pt ambulated to toilet with RW and steadying assist, with 1 LOB requiring mod A for controlled descent onto toilet. Pt completed seated toileting task and hygiene with supervision. Pt completed stand pivot transfer toilet> w/c with mod A to lower. Pt dressed UB seated in w/c at the sink. He donned underwear and pants seated and stood with heavy steadying assist to pull pants up. Pt with decreased dynamic standing balance when using B UEs during functional task, requiring increased steadying assist.  Pt donned B shoes and socks with increased time and supervision for safety, and completed oral care seated in w/c at sink with increased time to open containers and supervision.   Education provided regarding exercises for theraputty using ultra soft putty. Pt left in w/c at nurses station manipulating theraputty at end of session, safety belt donned.    Therapy Documentation Precautions:  Precautions Precautions: Fall Restrictions Weight Bearing Restrictions: No Pain: Pain Assessment Pain Assessment: No/denies pain Pain Score: 0-No pain  See FIM  for current functional status  Therapy/Group: Individual Therapy  [name], Tavonte Seybold C 04/30/2014, 7:19 AM

## 2014-04-30 NOTE — Progress Notes (Addendum)
Physical Therapy Session Note  Patient Details  Name: Hayden Mckinney MRN: [id number] Date of Birth: 01-Feb-1945  Today's Date: 04/30/2014 PT Individual Time: 0800-0900 PT Individual Time Calculation (min): 60 min   Short Term Goals: Week 2:  PT Short Term Goal 1 (Week 2): = LTGs  Skilled Therapeutic Interventions/Progress Updates:  Tx focused on safety with  functional mobility, gait with RW, and NMR via multi-modal cues and cognitive remediation. Pt up and eating, reviewed safety precautions, progress towards goals, and recall of swallow precautions. Pt able to recall safety cues, but still needing max cues throughout during functional tasks.   Pt propelled WC 2x150' with distant S with bil LEs/UEs for activity tolerance and coordination.   Squat-pivot transfers with min A for steadying with decreased anterior translation over BOS, pressing with back of LEs.   Serial sit<>stands 3x5, including 5 times STS at 47 sec. Pt needed safety cues for sequence, hand placement, and leaning forward.   Static and dynamic standing balance with horseshoe activity with up to Max A without UE support 1 round x3 min, Min A with RW x4min, reaching behind to mat for horseshoes.   Gait with RW 1x150' and 1x100' with RW and verbal/tactile cues for posture, RW positioning, and step width. Min A in straight paths, up to Mod A with turns and busy environments.   Pt left up at RN station.       Therapy Documentation Precautions:  Precautions Precautions: Fall Restrictions Weight Bearing Restrictions: No General:   Vital Signs: Therapy Vitals Temp: 98.5 F (36.9 C) Temp Source: Oral Pulse Rate: 77 Resp: 17 BP: 131/66 mmHg Patient Position (if appropriate): Lying Oxygen Therapy SpO2: 99 % O2 Device: Not Delivered Pain: 5/10 pain at loop recorder, modified tx, not wanting pain meds   Mobility: See FIM for current functional status  Therapy/Group: Individual Therapy  Marilynn Ekstein, Corinna Lines, PT, DPT  04/30/2014, 8:06 AM

## 2014-04-30 NOTE — Progress Notes (Signed)
[name] PH[facility]ICAL MEDICINE & REHABILITATION     PROGRESS NOTE    Subjective/Complaints: Feeling well. Denies pain. Slept well. Wants to know when he'll get to leave.       Objective: Vital Signs: Blood pressure 131/66, pulse 77, temperature 98.5 F (36.9 C), temperature source Oral, resp. rate 17, weight 65.1 kg (143 lb 8.3 oz), SpO2 99 %. Dg Swallowing Func-speech Pathology  04/28/2014    Objective Swallowing Evaluation:    Patient Details  Name: Hayden Mckinney MRN: [id number] Date of Birth: [birth year]  Today's Date: 04/28/2014 Time: SLP Start Time (ACUTE ONLY): 0900-SLP Stop Time (ACUTE ONLY): 0930 SLP Time Calculation (min) (ACUTE ONLY): 30 min  Past Medical History:  Past Medical History  Diagnosis Date  . Type II diabetes mellitus   . Sarcoma     a. R leg  . Diabetic peripheral neuropathy     a. both feet  . TIA (transient ischemic attack)     a. 08/2010.  Marland Kitchen Hyperlipidemia   . Hypertension   . Stroke     a. 04/2014 multiple bateral infarcts, presumed to be embolic;  b. [id number]  Carotid U/S: 1-39% bilat ICA stenosis.  . H/O echocardiogram     a. 04/2014 Echo: EF 55-60%, no rwma.  Marland Kitchen ETOH abuse     a. 04/2014 18-24 beers q weekend.   Past Surgical History:  Past Surgical History  Procedure Laterality Date  . Sarcoma removal    . Repair of r hand after trauma    . Loop recorder implant N/A 04/20/2014    Procedure: LOOP RECORDER IMPLANT;  Surgeon: Thompson Grayer, MD;  Location:  [facility] CATH LAB;  Service: Cardiovascular;  Laterality: N/A;  . Tee without cardioversion N/A 04/20/2014    Procedure: TRANSESOPHAGEAL ECHOCARDIOGRAM (TEE);  Surgeon: Larey Dresser, MD;  Location: [facility] ENDOSCOPY;  Service: Cardiovascular;   Laterality: N/A;   HPI:  HPI: 69 y.o. male, with past medical history significant for hypertension  and diabetes and TIAs in the past presenting with 2 weeks history of  increasing weakness, falls and slurred speech that was noted today by his  sister who was visiting him. MRI of the brain shows  moderate size acute  subacute nonhemorrhagic infarct mid to superior right cerebellar with  local mass effect as well as tiny acute nonhemorrhagic posterior right  operculum infarct, subacute to remote partially hemorrhagic infarct right  lenticular nucleus mid right coronal radiata and remote bilateral thalamic  and anterior left external capsule infarct. Patient had MBS on 4/13 and  recommended Dys. 3 textures with nectar-thick liquids due to silent  aspiration of thin liquids. Patient transferred to CIR on 4/15 and has  been participating in dysphagia treatment. Repeat MBS today to assess for  possible upgrade.   No Data Recorded  Assessment / Plan / Recommendation CHL IP CLINICAL IMPRESSIONS 04/28/2014  Dysphagia Diagnosis Mild oral phase dysphagia;Mild pharyngeal phase  dysphagia  Clinical impression Pt has a mild oropharyngeal dysphagia with decreased  lingual manipulation and trace base of tongue residue with piecemeal  swallowing with Dys. 1 and Dys. 3 textures. Patient also has prolonged  mastication due to being edentulous. Patient has a delayed swallow  initiation resulting in frequent flash penetration of thin liquids.  Patient also demonstrates trace vallecular residue that cleared with a  cued second swallow.  Although patient demonstrates flash penetration with  thin liquids, patient has been consuming trials of thin liquids with SLP  and independently from his sink (  per patient report) and appears to be  tolerating it without any adverse effects at this time, therefore,   recommend patient upgrade to thin liquids and continue full supervision to  maximize overall swallowing safety.       CHL IP TREATMENT RECOMMENDATION 04/28/2014  Treatment Plan Recommendations Therapy as outlined in treatment plan below      CHL IP DIET RECOMMENDATION 04/28/2014  Diet Recommendations Dysphagia 3 (Mechanical Soft);Thin liquid  Liquid Administration via Cup;Straw  Medication Administration Whole meds with puree   Compensations Small sips/bites;Check for pocketing;Slow rate  Postural Changes and/or Swallow Maneuvers Seated upright 90  degrees;Upright 30-60 min after meal     CHL IP OTHER RECOMMENDATIONS 04/28/2014  Recommended Consults (None)  Oral Care Recommendations Oral care BID  Other Recommendations (None)     CHL IP FOLLOW UP RECOMMENDATIONS 04/28/2014  Follow up Recommendations 24 hour supervision/assistance;Skilled Nursing  facility     CHL IP FREQUENCY AND DURATION 04/21/2014  Speech Therapy Frequency (ACUTE ONLY) min 2x/week  Treatment Duration 2 weeks     Pertinent Vitals/Pain N/A    SLP Swallow Goals No flowsheet data found.  No flowsheet data found.    CHL IP REASON FOR REFERRAL 04/28/2014  Reason for Referral Objectively evaluate swallowing function     CHL IP ORAL PHASE 04/28/2014  Lips (None)  Tongue (None)  Mucous membranes (None)  Nutritional status (None)  Other (None)  Oxygen therapy (None)  Oral Phase Impaired  Oral - Pudding Teaspoon (None)  Oral - Pudding Cup (None)  Oral - Honey Teaspoon (None)  Oral - Honey Cup (None)  Oral - Honey Syringe (None)  Oral - Nectar Teaspoon (None) Oral - Nectar Cup Weak lingual  manipulation;Lingual/palatal residue Oral - Nectar Straw NT  Oral - Nectar Syringe (None)  Oral - Ice Chips (None)  Oral - Thin Teaspoon (None)  Oral - Thin Cup Weak lingual manipulation;Lingual/palatal residue  Oral - Thin Straw Weak lingual manipulation;Lingual/palatal residue  Oral - Thin Syringe (None)  Oral - Puree Weak lingual manipulation;Lingual/palatal residue;Piecemeal  swallowing  Oral - Mechanical Soft Impaired mastication;Weak lingual  manipulation;Lingual/palatal residue;Piecemeal swallowing  Oral - Regular (None)  Oral - Multi-consistency (None)  Oral - Pill NT  Oral Phase - Comment (None)      CHL IP PHARYNGEAL PHASE 04/28/2014  Pharyngeal Phase Impaired  Pharyngeal - Pudding Teaspoon (None)  Penetration/Aspiration details (pudding teaspoon) (None)  Pharyngeal - Pudding Cup (None)   Penetration/Aspiration details (pudding cup) (None)  Pharyngeal - Honey Teaspoon (None)  Penetration/Aspiration details (honey teaspoon) (None)  Pharyngeal - Honey Cup (None)  Penetration/Aspiration details (honey cup) (None)  Pharyngeal - Honey Syringe (None)  Penetration/Aspiration details (honey syringe) (None)  Pharyngeal - Nectar Teaspoon (None)  Penetration/Aspiration details (nectar teaspoon) (None)  Pharyngeal - Nectar Cup Delayed swallow initiation;Pharyngeal residue -  valleculae;Compensatory strategies attempted (Comment)  Penetration/Aspiration details (nectar cup) (None)  Pharyngeal - Nectar Straw NT  Penetration/Aspiration details (nectar straw) (None)  Pharyngeal - Nectar Syringe (None)  Penetration/Aspiration details (nectar syringe) (None)  Pharyngeal - Ice Chips (None)  Penetration/Aspiration details (ice chips) (None)  Pharyngeal - Thin Teaspoon (None)  Penetration/Aspiration details (thin teaspoon) (None)  Pharyngeal - Thin Cup Delayed swallow initiation;Pharyngeal residue -  valleculae  Penetration/Aspiration details (thin cup) Material enters airway, remains  ABOVE vocal cords then ejected out  Pharyngeal - Thin Straw Delayed swallow initiation;Pharyngeal residue -  valleculae  Penetration/Aspiration details (thin straw) Material enters airway,  remains ABOVE vocal cords then ejected out  Pharyngeal -  Thin Syringe (None)  Penetration/Aspiration details (thin syringe') (None)  Pharyngeal - Puree Delayed swallow initiation  Penetration/Aspiration details (puree) (None)  Pharyngeal - Mechanical Soft Delayed swallow initiation  Penetration/Aspiration details (mechanical soft) (None)  Pharyngeal - Regular (None)  Penetration/Aspiration details (regular) (None)  Pharyngeal - Multi-consistency (None)  Penetration/Aspiration details (multi-consistency) (None)  Pharyngeal - Pill NT  Penetration/Aspiration details (pill) (None)  Pharyngeal Comment (None)     CHL IP CERVICAL ESOPHAGEAL PHASE 04/28/2014   Cervical Esophageal Phase WFL  Pudding Teaspoon (None)  Pudding Cup (None)  Honey Teaspoon (None)  Honey Cup (None)  Honey Syringe (None)  Nectar Teaspoon (None)  Nectar Cup (None)  Nectar Straw (None)  Nectar Syringe (None)  Thin Teaspoon (None)  Thin Cup (None)  Thin Straw (None)  Thin Syringe (None)  Cervical Esophageal Comment (None)    No flowsheet data found.         PAYNE, Sargent 04/28/2014, 10:21 AM    No results for input(s): WBC, HGB, HCT, PLT in the last 72 hours. No results for input(s): NA, K, CL, GLUCOSE, BUN, CREATININE, CALCIUM in the last 72 hours.  Invalid input(s): CO CBG (last 3)   Recent Labs  04/29/14 1644 04/29/14 2114 04/30/14 0652  GLUCAP 118* 139* 127*    Wt Readings from Last 3 Encounters:  04/27/14 65.1 kg (143 lb 8.3 oz)  04/16/14 67.1 kg (147 lb 14.9 oz)  12/14/11 76.613 kg (168 lb 14.4 oz)    Physical Exam:   Gen:no acute distress,  HENT:edentulous. Oral mucosa pink and moist Head: Normocephalic.  Eyes: EOM are normal.  Neck: Normal range of motion. Neck supple. No thyromegaly present.  Cardiovascular: Normal rate and regular rhythm.no murmurs or rubs  Respiratory: Effort normal and breath sounds normal. No respiratory distress. no wheezes GI: Soft. Bowel sounds are normal. He exhibits no distension. Non-tender Neurological: He is alert.  Speech is  dysarthric but somewhat intelligibile. He is able to provide his name and age as well as date of birth. Followed simple commands. Right limb ataxia persistent, right PD. RUE motor 4 to 4+/5 delt,bic,tri,wrist, hand. RLE: 4/5 hf,ke. 0 to trace ADF and 3-4/5 APF. Sensation intact to pain on right side Skin: Skin is warm and dry.  Psych: pt pleasant and appropriate  Assessment/Plan: 1. 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 Physiatrist  is providing close team supervision and 24 hour management of active medical problems listed below. Physiatrist and rehab team continue to assess barriers to discharge/monitor patient progress toward functional and medical goals.  Seeking placement given projected care needs/dispo  FIM: FIM - Bathing Bathing Steps Patient Completed: Chest, Right Arm, Left Arm, Abdomen, Right upper leg, Left upper leg, Left lower leg (including foot), Right lower leg (including foot) Bathing: 5: Supervision: Safety issues/verbal cues  FIM - Upper Body Dressing/Undressing Upper body dressing/undressing steps patient completed: Pull shirt over trunk, Thread/unthread right sleeve of pullover shirt/dresss, Thread/unthread left sleeve of pullover shirt/dress, Put head through opening of pull over shirt/dress Upper body dressing/undressing: 5: Supervision: Safety issues/verbal cues FIM - Lower Body Dressing/Undressing Lower body dressing/undressing steps patient completed: Don/Doff right sock, Don/Doff left sock, Don/Doff left shoe, Don/Doff right shoe Lower body dressing/undressing: 5: Supervision: Safety issues/verbal cues  FIM - Toileting Toileting steps completed by patient: Adjust clothing prior to toileting, Performs perineal hygiene, Adjust clothing after toileting Toileting Assistive Devices: Grab bar or rail  for support Toileting: 4: Steadying assist  FIM - Radio producer Devices: Grab bars Toilet Transfers: 4-To toilet/BSC: Min A (steadying Pt. > 75%), 4-From toilet/BSC: Min A (steadying Pt. > 75%)  FIM - Bed/Chair Transfer Bed/Chair Transfer Assistive Devices: Arm rests Bed/Chair Transfer: 4: Bed > Chair or W/C: Min A (steadying Pt. > 75%), 4: Chair or W/C > Bed: Min A (steadying Pt. > 75%)  FIM - Locomotion: Wheelchair Distance: 160 Locomotion: Wheelchair: 5: Travels 150 ft or more: maneuvers on rugs and over door sills with supervision, cueing or coaxing FIM -  Locomotion: Ambulation Locomotion: Ambulation Assistive Devices: Administrator Ambulation/Gait Assistance: 4: Min assist, 3: Mod assist Locomotion: Ambulation: 2: Travels 50 - 149 ft with moderate assistance (Pt: 50 - 74%)  Comprehension Comprehension Mode: Auditory Comprehension: 5-Understands basic 90% of the time/requires cueing < 10% of the time  Expression Expression Mode: Verbal Expression: 3-Expresses basic 50 - 74% of the time/requires cueing 25 - 50% of the time. Needs to repeat parts of sentences.  Social Interaction Social Interaction: 4-Interacts appropriately 75 - 89% of the time - Needs redirection for appropriate language or to initiate interaction.  Problem Solving Problem Solving: 4-Solves basic 75 - 89% of the time/requires cueing 10 - 24% of the time  Memory Memory: 4-Recognizes or recalls 75 - 89% of the time/requires cueing 10 - 24% of the time  Medical Problem List and Plan: 1. 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 DVT Prophylaxis/Anticoagulation: Subcutaneous Lovenox. Monitor platelet counts and any signs of bleeding. Venous Dopplers negative 3. Pain Management: Lyrica 50 mg twice a day Tylenol as needed 4. Hypertension. Coreg 6.25 mg twice a day,BIDIL 20-30 7.5 mg 3 times a day. Monitor with increased mobility  5. Neuropsych: This patient is capable of making decisions on his own behalf. 6. Skin/Wound Care: Routine skin checks  7. Fluids/Electrolytes/Nutrition: encourage adequate po 8. Diabetes mellitus with peripheral neuropathy. Latest hemoglobin A1c 7.9. Presently with sliding scale insulin. Patient on Glucophage 1000 mg twice a day prior to admission--holding given Cr.   -amaryl decreased to 1mg  given hypoglycemia---observe today 9. Question left lower lobe pneumonia. abx stopped. afebrile 10. Acute on chronic renal insufficiency. Cr around 1.5  -neurogenic bladder/urine retention---ua neg,  culture negative   -added flomax   -emptying well now 11. Dysphagia. Diet changed to mechanical soft nectar liquids after modified barium swallow 04/21/2014.    -tolerating thus far   LOS (Days) 9 A FACE TO FACE EVALUATION WAS PERFORMED  SWARTZ,ZACHARY T 04/30/2014 7:44 AM

## 2014-04-30 NOTE — Progress Notes (Signed)
Physical Therapy Session Note  Patient Details  Name: Hayden Mckinney MRN: [id number] Date of Birth: June 28, 1945  Today's Date: 04/30/2014 PT Individual Time: 1430-1530 PT Individual Time Calculation (min): 60 min   Short Term Goals: Week 2:  PT Short Term Goal 1 (Week 2): = LTGs  Skilled Therapeutic Interventions/Progress Updates:    Session focused on functional transfers, w/c propulsion for overall strengthening and coordination using BLE, neuro re-ed for balance re-training and postural control activities, gait training with RW with focus on safe use of AD and balance, stair negotiation for home entry simulation and community mobility training, and general strengthening and endurance on Nustep (5 min with LE's only and 5 min with BUE and BLE on level 4).  Pt required mod to max verbal cues for safety during session with decreased awareness to deficits.   Neuro re-ed activities to address balance re-training and postural control included boxing in standing (3 episodes of total LOB posterior with uncontrolled descent onto mat -max A), dual cognitive task to put together and take apart pipe tree puzzle while on compliant surface in standing, seated ball toss and kick techniques to work on coordination and sitting balance/postural control with S, and sit to stands with focus on eccentric control and technique.   Therapy Documentation Precautions:  Precautions Precautions: Fall Restrictions Weight Bearing Restrictions: No  Pain:  Denies pain.  See FIM for current functional status  Therapy/Group: Individual Therapy  Canary Brim Ivory Broad, PT, DPT  04/30/2014, 3:43 PM

## 2014-04-30 NOTE — Progress Notes (Signed)
Speech Language Pathology Daily Session Note  Patient Details  Name: BREYDON BRUM MRN: [id number] Date of Birth: [birth year]  Today's Date: 04/30/2014 SLP Individual Time: 1130-1200 SLP Individual Time Calculation (min): 30 min  Short Term Goals: Week 2: SLP Short Term Goal 1 (Week 2): Patient will consume least restrictive diet with Min A verbal and visual cues for use of swallow strategies. SLP Short Term Goal 2 (Week 2): Patient will increase speech intelligibility to ~75% at the phrase level with Mod A visual and verbal cues for use of intelligibility strategies. SLP Short Term Goal 3 (Week 2): Patient will demonstrate mildly complex problem-solving associated with functional tasks given Min A visual and verbal cues in 75% of observable instances. SLP Short Term Goal 4 (Week 2): Patient will utilize external aid to recall new, daily information, such as diet restrictions and intelligibility strategies, with Supervision A verbal and visual cues over 3 consecutive sessions. SLP Short Term Goal 5 (Week 2): Patient will monitor and self-correct errors relating to cognitive deficits during functional tasks with Min A verbal and question cues in 75% of observable instances.  Skilled Therapeutic Interventions:  Pt was seen for skilled ST targeting cognitive goals.  Upon arrival, pt was seated upright in wheelchair at nursing station with quick release belt donned.  SLP facilitated the session with a semi-complex card game targeting planning, organization and error awareness.  Pt planned and executed a problem solving strategy with min assist verbal cues.  He also recognized and corrected errors in the moment with min assist verbal and visual cues.  Pt left up in wheelchair with quick release belt donned at nursing station.    FIM:  Comprehension Comprehension Mode: Auditory Comprehension: 5-Understands basic 90% of the time/requires cueing < 10% of the time Expression Expression Mode:  Verbal Expression: 3-Expresses basic 50 - 74% of the time/requires cueing 25 - 50% of the time. Needs to repeat parts of sentences. Social Interaction Social Interaction: 4-Interacts appropriately 75 - 89% of the time - Needs redirection for appropriate language or to initiate interaction. Problem Solving Problem Solving: 4-Solves basic 75 - 89% of the time/requires cueing 10 - 24% of the time Memory Memory: 4-Recognizes or recalls 75 - 89% of the time/requires cueing 10 - 24% of the time FIM - Eating Eating Activity: 5: Supervision/cues  Pain Pain Assessment Pain Assessment: No/denies pain  Therapy/Group: Individual Therapy  Lujain Kraszewski, Selinda Orion 04/30/2014, 3:54 PM

## 2014-05-01 ENCOUNTER — Inpatient Hospital Stay (HOSPITAL_COMMUNITY): Payer: Medicare Other | Admitting: *Deleted

## 2014-05-01 DIAGNOSIS — I10 Essential (primary) hypertension: Secondary | ICD-10-CM

## 2014-05-01 LAB — GLUCOSE, CAPILLARY
GLUCOSE-CAPILLARY: 114 mg/dL — AB (ref 70–99)
GLUCOSE-CAPILLARY: 141 mg/dL — AB (ref 70–99)
Glucose-Capillary: 109 mg/dL — ABNORMAL HIGH (ref 70–99)
Glucose-Capillary: 103 mg/dL — ABNORMAL HIGH (ref 70–99)

## 2014-05-01 NOTE — Progress Notes (Signed)
Hayden Mckinney is a 69 y.o. male May 03, 1945 [id number]  Subjective: No new complaints. No new problems. Slept well. Feeling OK.  Objective: Vital signs in last 24 hours: Temp:  [98.8 F (37.1 C)] 98.8 F (37.1 C) (04/23 0545) Pulse Rate:  [69-79] 69 (04/23 0545) Resp:  [18] 18 (04/23 0545) BP: (122-139)/(52-62) 122/52 mmHg (04/23 0545) SpO2:  [96 %] 96 % (04/23 0545) Weight change:  Last BM Date: 04/30/14  Intake/Output from previous day: 04/22 0701 - 04/23 0700 In: 720 [P.O.:720] Out: 475 [Urine:475] Last cbgs: CBG (last 3)   Recent Labs  04/30/14 1642 04/30/14 2110 05/01/14 0626  GLUCAP 153* 119* 114*     Physical Exam General: No apparent distress   HEENT: not dry Lungs: Normal effort. Lungs clear to auscultation, no crackles or wheezes. Cardiovascular: Regular rate and rhythm, no edema Abdomen: S/NT/ND; BS(+) Musculoskeletal:  unchanged Neurological: No new neurological deficits. In a w/c Wounds: N/A    Skin: clear  Aging changes Mental state: Alert, disoriented, cooperative    Lab Results: BMET    Component Value Date/Time   NA 143 04/21/2014 0434   K 3.7 04/21/2014 0434   CL 115* 04/21/2014 0434   CO2 22 04/21/2014 0434   GLUCOSE 148* 04/21/2014 0434   BUN 25* 04/21/2014 0434   CREATININE 1.53* 04/21/2014 1715   CREATININE 0.97 12/15/2010 1343   CALCIUM 8.3* 04/21/2014 0434   GFRNONAA 45* 04/21/2014 1715   GFRNONAA 81 12/15/2010 1343   GFRAA 52* 04/21/2014 1715   GFRAA >89 12/15/2010 1343   CBC    Component Value Date/Time   WBC 5.4 04/22/2014 0639   RBC 3.60* 04/22/2014 0639   HGB 10.2* 04/22/2014 0639   HCT 31.2* 04/22/2014 0639   PLT 189 04/22/2014 0639   MCV 86.7 04/22/2014 0639   MCH 28.3 04/22/2014 0639   MCHC 32.7 04/22/2014 0639   RDW 13.3 04/22/2014 0639   LYMPHSABS 1.8 04/20/2014 1340   MONOABS 0.5 04/20/2014 1340   EOSABS 0.1 04/20/2014 1340   BASOSABS 0.0 04/20/2014 1340    Studies/Results: No results  found.  Medications: I have reviewed the patient's current medications.  Assessment/Plan:  1. 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 DVT Prophylaxis/Anticoagulation: Subcutaneous Lovenox. Monitor platelet counts and any signs of bleeding. Venous Dopplers negative 3. Pain Management: Lyrica 50 mg twice a day Tylenol as needed 4. Hypertension. Coreg 6.25 mg twice a day,BIDIL 20-30 7.5 mg 3 times a day. Monitor with increased mobility  5. Neuropsych: This patient is capable of making decisions on his own behalf. 6. Skin/Wound Care: Routine skin checks  7. Fluids/Electrolytes/Nutrition: encourage adequate po 8. Diabetes mellitus with peripheral neuropathy. Latest hemoglobin A1c 7.9. Presently with sliding scale insulin. Patient on Glucophage 1000 mg twice a day prior to admission--holding given Cr.  -amaryl decreased to 1mg  given hypoglycemia---observe today 9. Question left lower lobe pneumonia. abx stopped. afebrile 10. Acute on chronic renal insufficiency. Cr around 1.5 -neurogenic bladder/urine retention---ua neg, culture negative -added flomax -emptying well now 11. Dysphagia. Diet changed to mechanical soft nectar liquids after modified barium swallow 04/21/2014.  -tolerating thus far    Length of stay, days: [place] , MD 05/01/2014, 11:29 AM

## 2014-05-01 NOTE — Progress Notes (Signed)
Physical Therapy Session Note  Patient Details  Name: Hayden Mckinney MRN: [id number] Date of Birth: 07-04-1945  Today's Date: 05/01/2014 PT Individual Time: 1130-1200 PT Individual Time Calculation (min): 30 min     Skilled Therapeutic Interventions/Progress Updates:  Patient in w/c sitting at the nurses station, agrees to therapy, no complains of pain or discomfort noted during session. W/c propulsion training around the unit on floor and on carpet , focus on maneuvering around obstacles and in small spaces to increase control and increase active participation,increased VC for decreasing speed when turning and paying closer attention to path. NuStep 1 x 10 min at level 7 resistance for strengthening and facilitation of reciprocal gait pattern.  Gait Training 2 x 100 feet with max A ,multiple knee buckling episodes,requiering max A from therapist to prevent fall, patient with severely impaired safety awareness, does not recognize hazards and dangerous situations.  Patient returned to nursing station with QR belt on.   Therapy Documentation Precautions:  Precautions Precautions: Fall Restrictions Weight Bearing Restrictions: No Vital Signs: Therapy Vitals Temp: 97.8 F (36.6 C) Temp Source: Oral Pulse Rate: 68 Resp: 18 BP: 136/61 mmHg Patient Position (if appropriate): Lying Oxygen Therapy SpO2: 99 % O2 Device: Not Delivered  See FIM for current functional status  Therapy/Group: Individual Therapy  [name] 05/01/2014, 4:02 PM

## 2014-05-02 ENCOUNTER — Inpatient Hospital Stay (HOSPITAL_COMMUNITY): Payer: Medicare Other | Admitting: Occupational Therapy

## 2014-05-02 DIAGNOSIS — K5901 Slow transit constipation: Secondary | ICD-10-CM

## 2014-05-02 LAB — GLUCOSE, CAPILLARY
GLUCOSE-CAPILLARY: 96 mg/dL (ref 70–99)
Glucose-Capillary: 133 mg/dL — ABNORMAL HIGH (ref 70–99)
Glucose-Capillary: 120 mg/dL — ABNORMAL HIGH (ref 70–99)
Glucose-Capillary: 150 mg/dL — ABNORMAL HIGH (ref 70–99)

## 2014-05-02 MED ORDER — SENNOSIDES-DOCUSATE SODIUM 8.6-50 MG PO TABS
2.0000 | ORAL_TABLET | Freq: Two times a day (BID) | ORAL | Status: DC
  Administered 2014-05-02 – 2014-05-04 (×5): 2 via ORAL
  Filled 2014-05-02 (×5): qty 2

## 2014-05-02 NOTE — Progress Notes (Signed)
Hayden Mckinney is a 69 y.o. male Dec 06, 1945 [id number]  Subjective: No new complaints. Needs a stool softner. Slept well. Feeling OK.  Objective: Vital signs in last 24 hours: Temp:  [97.8 F (36.6 C)-98.4 F (36.9 C)] 98.4 F (36.9 C) (04/24 0545) Pulse Rate:  [67-70] 70 (04/24 0545) Resp:  [18] 18 (04/24 0545) BP: (136-148)/(58-71) 148/58 mmHg (04/24 0545) SpO2:  [96 %-99 %] 96 % (04/24 0545) Weight change:  Last BM Date: 05/01/14  Intake/Output from previous day: 04/23 0701 - 04/24 0700 In: 740 [P.O.:740] Out: 575 [Urine:575] Last cbgs: CBG (last 3)   Recent Labs  05/01/14 1657 05/01/14 2120 05/02/14 0639  GLUCAP 103* 141* 96     Physical Exam General: No apparent distress   HEENT: not dry Lungs: Normal effort. Lungs clear to auscultation, no crackles or wheezes. Cardiovascular: Regular rate and rhythm, no edema Abdomen: S/NT/ND; BS(+) Musculoskeletal:  unchanged Neurological: No new neurological deficits. In a w/c Wounds: N/A    Skin: clear  Aging changes Mental state: Alert, disoriented, cooperative    Lab Results: BMET    Component Value Date/Time   NA 143 04/21/2014 0434   K 3.7 04/21/2014 0434   CL 115* 04/21/2014 0434   CO2 22 04/21/2014 0434   GLUCOSE 148* 04/21/2014 0434   BUN 25* 04/21/2014 0434   CREATININE 1.53* 04/21/2014 1715   CREATININE 0.97 12/15/2010 1343   CALCIUM 8.3* 04/21/2014 0434   GFRNONAA 45* 04/21/2014 1715   GFRNONAA 81 12/15/2010 1343   GFRAA 52* 04/21/2014 1715   GFRAA >89 12/15/2010 1343   CBC    Component Value Date/Time   WBC 5.4 04/22/2014 0639   RBC 3.60* 04/22/2014 0639   HGB 10.2* 04/22/2014 0639   HCT 31.2* 04/22/2014 0639   PLT 189 04/22/2014 0639   MCV 86.7 04/22/2014 0639   MCH 28.3 04/22/2014 0639   MCHC 32.7 04/22/2014 0639   RDW 13.3 04/22/2014 0639   LYMPHSABS 1.8 04/20/2014 1340   MONOABS 0.5 04/20/2014 1340   EOSABS 0.1 04/20/2014 1340   BASOSABS 0.0 04/20/2014 1340     Studies/Results: No results found.  Medications: I have reviewed the patient's current medications.  Assessment/Plan:  1. 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 DVT Prophylaxis/Anticoagulation: Subcutaneous Lovenox. Monitor platelet counts and any signs of bleeding. Venous Dopplers negative 3. Pain Management: Lyrica 50 mg twice a day Tylenol as needed 4. Hypertension. Coreg 6.25 mg twice a day,BIDIL 20-30 7.5 mg 3 times a day. Monitor with increased mobility  5. Neuropsych: This patient is capable of making decisions on his own behalf. 6. Skin/Wound Care: Routine skin checks  7. Fluids/Electrolytes/Nutrition: encourage adequate po 8. Diabetes mellitus with peripheral neuropathy. Latest hemoglobin A1c 7.9. Presently with sliding scale insulin. Patient on Glucophage 1000 mg twice a day prior to admission--holding given Cr.  -amaryl decreased to 1mg  given hypoglycemia---observe today 9. Question left lower lobe pneumonia. abx stopped. afebrile 10. Acute on chronic renal insufficiency. Cr around 1.5 -neurogenic bladder/urine retention---ua neg, culture negative -added flomax -emptying well now 11. Dysphagia. Diet changed to mechanical soft nectar liquids after modified barium swallow 04/21/2014.  -tolerating thus far 12. Constipation - see meds    Length of stay, days: 11  Walker Kehr , MD 05/02/2014, 8:14 AM

## 2014-05-02 NOTE — Progress Notes (Signed)
Occupational Therapy Session Note  Patient Details  Name: Hayden Mckinney MRN: [id number] Date of Birth: 05/07/1945  Today's Date: 05/02/2014 OT Individual Time: [id number] OT Individual Time Calculation (min): 55 min    Short Term Goals: Week 2:  OT Short Term Goal 1 (Week 2): STG=LTG  Skilled Therapeutic Interventions/Progress Updates:    Skilled OT session focusing on functional mobility with RW and self-care retraining. Pt received in room, supine>sit mod I then sit>stand mod A to use RW.  Pt donned pants and shoes with supervision, close supervision for sit>stand. Therapist educated on techniques for standing such as leaning forward and pushing up from bed. Second trial min A sit>stand. Pt used RW to ambulate to day room. Received instructions and demonstration of side step RW technique. Pt attempted forward mobility and side step around furniture. Therapist provided manual facilitation of hips and tactile cues on thighs for stability and sequencing. Noted difficulty with motor control and sequencing. Noted left toe drag causing one episode LOB anteriorally. Pt ambulated min A back to room. Therapist facilitated change of clothes into hospital gown.  Lateral leans for LB bathing with supervision. Pt donned brief using lateral leans with supervision. Pt returned to bed supine, left with  needs in reach.  Therapy Documentation Precautions:  Precautions Precautions: Fall Restrictions Weight Bearing Restrictions: No General:   Vital Signs: Therapy Vitals Temp: 98 F (36.7 C) Temp Source: Oral Pulse Rate: 63 Resp: 18 BP: 132/74 mmHg Patient Position (if appropriate): Lying Oxygen Therapy SpO2: 96 % O2 Device: Not Delivered Pain: No c/o pain    See FIM for current functional status  Therapy/Group: Individual Therapy  Eugene Garnet 05/02/2014, 5:37 PM

## 2014-05-03 ENCOUNTER — Inpatient Hospital Stay (HOSPITAL_COMMUNITY): Payer: Medicare Other

## 2014-05-03 ENCOUNTER — Inpatient Hospital Stay (HOSPITAL_COMMUNITY): Payer: Medicare Other | Admitting: Occupational Therapy

## 2014-05-03 ENCOUNTER — Encounter (HOSPITAL_COMMUNITY): Payer: Medicare Other | Admitting: Speech Pathology

## 2014-05-03 LAB — GLUCOSE, CAPILLARY
GLUCOSE-CAPILLARY: 99 mg/dL (ref 70–99)
Glucose-Capillary: 107 mg/dL — ABNORMAL HIGH (ref 70–99)
Glucose-Capillary: 89 mg/dL (ref 70–99)
Glucose-Capillary: 135 mg/dL — ABNORMAL HIGH (ref 70–99)

## 2014-05-03 NOTE — Progress Notes (Signed)
Speech Language Pathology Discharge Summary  Patient Details  Name: Hayden Mckinney MRN: [id number] Date of Birth: June 26, 1945  Today's Date: 05/03/2014 SLP Group Time: 1130-1150 SLP Group Time Calculation (min): 20 min   Skilled Therapeutic Interventions:  Pt was seen for skilled group ST targeting dysphagia goals.   Pt consumed presentations of dys 3 textures and thin liquids with supervision cues for use of swallowing precautions.  Pt demonstrated no overt s/s of aspiration with solid or liquid consistencies.  While pt continues to present with fast rate of self- feeding, he was noted to adequately clear his oral cavity of residual solids post swallow with mod I use of liquid wash.  Pt is making progress towards goals.  Continue per current plan of care.        Patient has met 3 of 5 long term goals.  Patient to discharge at overall Supervision;Min level.  Reasons goals not met: pt requires min assist for use of recall aids and is limited by poor intellectual awareness of deficits    Clinical Impression/Discharge Summary:  Pt made functional gains while inpatient and is discharging having met 3 out of 5 long term goals.  Pt currently requires min assist-supervision for basic to semi-complex cognitive-linguistic tasks due to decreased intellectual/emergent awareness of deficits, decreased recall of new information, and decreased functional problem solving.  Pt is currently consuming dys 3 textures and thin liquids with full supervision for use of swallowing precautions.  Pt has demonstrated improvements in self monitoring and correcting of oral residue although he continues to present with impulsivity when self feeding, see daily note above.  Pt is discharging to SNF and would benefit from skilled ST at next level of care to address dysphagia and cognition.  Also recommend continued family education at next level of care.    Care Partner:  Caregiver Able to Provide Assistance:  (SNF)  Type of  Caregiver Assistance:  (SNF)  Recommendation:  24 hour supervision/assistance;Skilled Nursing facility  Rationale for SLP Follow Up: Maximize swallowing safety;Maximize cognitive function and independence;Reduce caregiver burden;Maximize functional communication   Equipment: none recommended by SLP    Reasons for discharge: Discharged from hospital   Patient/Family Agrees with Progress Made and Goals Achieved: Yes   See FIM for current functional status  PageSelinda Orion 05/03/2014, 4:18 PM

## 2014-05-03 NOTE — Discharge Summary (Signed)
Discharge summary job [id number]

## 2014-05-03 NOTE — Plan of Care (Signed)
Problem: RH BOWEL ELIMINATION Goal: RH STG MANAGE BOWEL WITH ASSISTANCE STG Manage Bowel with Mod I Assistance.  Outcome: Not Progressing Had supp. And was disimpacted Goal: RH STG MANAGE BOWEL W/MEDICATION W/ASSISTANCE STG Manage Bowel with Medication with Mod I Assistance.  Outcome: Not Progressing supp given by RN

## 2014-05-03 NOTE — Progress Notes (Signed)
Physical Therapy Discharge Summary  Patient Details  Name: Hayden Mckinney MRN: [id number] Date of Birth: 01-Oct-1945  Today's Date: 05/03/2014 PT Individual Time: 6270-3500 PT Individual Time Calculation (min): 23 min  Denies pain. Session focused on addressing gait, safety with mobility, stair negotiation, d/c assessment, and functional bed mobility and transfers. Pt performing bed mobility at mod I level and min A for transfers due to cues for safety and min A for balance. Re-administered five time sit to stand (see results below). Pt demonstrating improvement in score with this assessment. Pt able to go up/down 15 steps with bilateral rails with min A for community mobility training. Gait to/from therapy gym with min A and cues for safe management of RW; 1 episode of R knee buckling requiring min A to recover. Positioned in bed with bed alarm on and all needs in place.     Five times Sit to Stand Test (FTSS) Method: Use a straight back chair with a solid seat that is 16-18" high. Ask participant to sit on the chair with arms folded across their chest.   Instructions: "Stand up and sit down as quickly as possible 5 times, keeping your arms folded across your chest."   Measurement: Stop timing when the participant stands the 5th time.  TIME: __13____ (in seconds)  Times > 13.6 seconds is associated with increased disability and morbidity (Guralnik, 2000) Times > 15 seconds is predictive of recurrent falls in healthy individuals aged 71 and older (Buatois, et al., 2008) Normal performance values in community dwelling individuals aged 50 and older (Bohannon, 2006): o 60-69 years: 11.4 seconds o 70-79 years: 12.6 seconds o 80-89 years: 14.8 seconds  MCID: ? 2.3 seconds for Vestibular Disorders (Meretta, 2006)     Patient has met 9 of 10 long term goals due to improved activity tolerance, improved balance, improved postural control, increased strength, functional use of  right upper  extremity and right lower extremity and improved coordination.  Patient to discharge at an overall min A level for transfers and gait with RW. S for w/c mobility.  Pt's family is unable to provide the needed level of care at this time and pt to d/c to SNF.  Reasons goals not met: Transfer goal not met due to pt requiring min A for safe transfers.   Recommendation:  Patient will benefit from ongoing skilled PT services in skilled nursing facility setting to continue to advance safe functional mobility, address ongoing impairments in gait, balance, strength, endurance, postural control, transfers, coordination, safety awareness, and minimize fall risk.  Equipment: No equipment provided. TBD at next venue of care.   Reasons for discharge: discharge from hospital  Patient/family agrees with progress made and goals achieved: Yes  PT Discharge Precautions/Restrictions Precautions Precautions: Fall Restrictions Weight Bearing Restrictions: No Cognition Overall Cognitive Status: Impaired/Different from baseline Safety/Judgment: Impaired Comments: Pt continues to be impulsive with mobility and demonstrate decreased safety awareness. Sensation Sensation Light Touch: Appears Intact (BLE - pt now able to distinguish LT on BLE) Proprioception Impaired Details: Impaired RLE Coordination Gross Motor Movements are Fluid and Coordinated: No Motor  Motor Motor: Ataxia;Motor impersistence;Abnormal postural alignment and control;Other (comment) (R hemiparesis)  Locomotion  Ambulation Ambulation/Gait Assistance: 4: Min assist  Trunk/Postural Assessment  Cervical Assessment Cervical Assessment: Within Functional Limits Thoracic Assessment Thoracic Assessment: Exceptions to [facility] (flexed posture) Lumbar Assessment Lumbar Assessment: Exceptions to [facility] (posterior pelvic tilt) Postural Control Postural Control: Deficits on evaluation Postural Limitations: Pt continues to demonstrate impaired  postural control and  delayed and inadequate balance reactions  Balance Balance Balance Assessed: Yes Static Sitting Balance Static Sitting - Level of Assistance: 5: Stand by assistance Dynamic Sitting Balance Dynamic Sitting - Level of Assistance: 5: Stand by assistance Static Standing Balance Static Standing - Level of Assistance: 5: Stand by assistance Dynamic Standing Balance Dynamic Standing - Level of Assistance: 4: Min assist;5: Stand by assistance (S with UE support and minimally challenging; ) Extremity Assessment    RLE Assessment RLE Assessment: Exceptions to [facility] RLE Strength RLE Overall Strength Comments: grossly 4/5 except trace at ankle LLE Assessment LLE Assessment: Within Functional Limits  See FIM for current functional status  Canary Brim Ivory Broad, PT, DPT  05/04/2014, 9:40 AM

## 2014-05-03 NOTE — Progress Notes (Signed)
Occupational Therapy Session Note  Patient Details  Name: Hayden Mckinney MRN: [id number] Date of Birth: 12-29-1945  Today's Date: 05/03/2014 OT Individual Time: 0930-1030 OT Individual Time Calculation (min): 60 min    Short Term Goals: Week 2:  OT Short Term Goal 1 (Week 2): STG=LTG  Skilled Therapeutic Interventions/Progress Updates:    Pt seen for OT session focusing on ADL retraining, functional sitting and standing balance, core strengthening, and functional activity tolerance and mobility. Pt in w/c at nurses station at beginning of session, agreeable to tx. Pt self-propelled w/c to room, where he completed shaving task seated in w/c with supervision at the sink. Pt then self propelled w/c to therapy gym with VCs for safety and w/c management. Pt transferred w/c> mat with steady assist. Pt stood from mat with RW and steadying assist and completed bean bag toss, required to match color of bean bags to colored dots on the floor and verbalizing the name of food written on bean bag, with emphasis on functional standing balance as he had to reach to R/L side to obtain bean bag. Pt required steadying assist throughout task, and VCs to enunciate when speaking. Pt demonstrates functional standing balance, tolerating ~5 minutes of standing before requiring seated rest break. Next, pt sat on therapy mat and reclined backwards onto wedged mat, he was required to come forward into upright sitting, catch bean bag and place in container on his ride side with emphasis on core strengthening and neuromuscular re-education as he was required to weight bear through R side to place bean bag into container.Pt completed activity with overall supervision with min VCs for safety and 2 rest breaks.  Pt voiced desire for ambulation task. He ambulated throughout therapy gym and to ADL apartment with steadying assist and min A for RW management and min VCs to slow pace and remain inside RW. Pt transferred on/off low  surface couch with supervision, and ambulated back to therapy gym. In gym, pt sat and changed pillow cases with set-up. He ambulated back to nurses station at end of session with same assist level as mentioned above. Pt left at nurses station with safety belt donned for safety.    Pt is demonstrating decreased impulsivity with mobility today compared to previous session, he still requires supervision at all times and safety belt donned for safety when not in therapy session. He is able to recall safety measures for ambulation (small strides and remain in RW, however, requires VCs throughout to follow).  Therapy Documentation Precautions:  Precautions Precautions: Fall Restrictions Weight Bearing Restrictions: No Pain: Pain Assessment Pain Assessment: No/denies pain  See FIM for current functional status  Therapy/Group: Individual Therapy  [name], [name] 05/03/2014, 10:36 AM

## 2014-05-03 NOTE — Progress Notes (Signed)
Physical Therapy Session Note  Patient Details  Name: Hayden Mckinney MRN: [id number] Date of Birth: 07-14-1945  Today's Date: 05/03/2014 PT Individual Time: 1300-1400 PT Individual Time Calculation (min): 60 min   Short Term Goals: Week 1:  PT Short Term Goal 1 (Week 1): Pt will be able to transfer with min A for basic transfers PT Short Term Goal 1 - Progress (Week 1): Met PT Short Term Goal 2 (Week 1): Pt will be able to demonstrate dynamic standing balance with min A PT Short Term Goal 2 - Progress (Week 1): Met (goal met with UE support) PT Short Term Goal 3 (Week 1): Pt will be able to gait with mod A x 50' PT Short Term Goal 3 - Progress (Week 1): Met PT Short Term Goal 4 (Week 1): Pt will be able to propel w/c x 100' with S around obstacles PT Short Term Goal 4 - Progress (Week 1): Met  Skilled Therapeutic Interventions/Progress Updates:    Session focused on functional w/c propulsion, neuro re-ed for postural control, balance reactions, and dynamic standing balance, basic transfers, gait with RW with min A and verbal cues for safety, and Nustep for general strengthening and endurance (10 min on level 6).   Neuro re-ed activities included standing on compliant surface while using parallel bars for limited UE support while performing functional reaching tasks, sidestepping/retro gait/tandem gait using line for visual feedback, kicking yoga block during gait (in parallel bars) with emphasis on decreased UE support to address coordination and balance, balancing with anterior and posterior weightshifting on balance board, and using kinetron in standing (stepping x 1 min, then balance x 1 min, x 2 reps).   Returned back to bed end of session with min A squat pivot and mod I to return to supine. Bed alarm on and all needs in reach.   Pt requires cues for hand placement for sit <-> stands and cues for eccentric control during all transfers. Continues to demonstrate poor insight and safety  awareness with mobility.     Therapy Documentation Precautions:  Precautions Precautions: Fall Restrictions Weight Bearing Restrictions: No  Pain: No complaints.   See FIM for current functional status  Therapy/Group: Individual Therapy  Canary Brim Ivory Broad, PT, DPT  05/03/2014, 3:29 PM

## 2014-05-03 NOTE — Discharge Summary (Signed)
Hayden Mckinney, Hayden Mckinney NO.:  [id number]  MEDICAL RECORD NO.:  [id number]  LOCATION:  [id number]                        FACILITY:  [facility]  PHYSICIAN:  Meredith Staggers, M.D.DATE OF BIRTH:  [birth year]  DATE OF ADMISSION:  04/21/2014 DATE OF DISCHARGE:  05/04/2014                              DISCHARGE SUMMARY   DISCHARGE DIAGNOSES: 1. Functional deficits secondary to right cerebellar as well as     subcortical infarct. 2. Subcutaneous Lovenox for DVT prophylaxis. 3. Pain management. 4. Hypertension. 5. Diabetes mellitus, peripheral neuropathy. 6. Acute on chronic renal insufficiency. 7. Dysphagia.  HISTORY OF PRESENT ILLNESS:  This is a 69 year old right-handed male with history of hypertension, diabetes mellitus, TIA, on aspirin therapy who presented on April 16, 2014 with right-sided weakness and slurred speech x2 weeks.  MRI of the brain showed moderate-size acute subacute nonhemorrhagic infarct, mid superior right cerebellar with local mass effect as well as tiny nonhemorrhagic posterior right operculum infarct, subacute to remote partially hemorrhagic infarct, right lenticular nucleus, mid right coronal radiata.  Echocardiogram with ejection fraction 60%, no wall motion abnormalities.  Carotid Dopplers with no ICA stenosis.  Venous Dopplers, lower extremities, negative.  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  The patient did not receive tPA.  TEE showed ejection fraction 65% without thrombus, no PFO, and underwent implantation of loop recorder on April 20, 2014 per Dr. Rayann Heman.  Neurology consulted, maintained on aspirin and Plavix therapy x3 months, then Plavix alone, and placed on subcutaneous Lovenox for DVT prophylaxis.  The patient developed a cough, low grade fever.  Chest x- ray, left basilar atelectasis.  No confluent infiltrate, placed on intravenous Unasyn,  changed to Augmentin.  Modified barium swallow. Mechanical soft with nectar liquids.  Physical and occupational therapy ongoing.  The patient was admitted for a comprehensive rehab program.  PAST MEDICAL HISTORY:  See discharge diagnoses.  SOCIAL HISTORY:  Lives with friends.  Independent prior to admission. Functional status upon admission to [facility] was moderate assist, ambulate 80 feet with a rolling walker; minimal assist, sit to stand; min to mod assist, activities of daily living.  PHYSICAL EXAMINATION:  VITAL SIGNS:  Blood pressure 144/63, pulse 66, temperature 98.4, respirations 20. GENERAL:  This was an alert male, speech was very dysarthric but intelligible.  He is able to provide his name and age, date of birth, follows commands. LUNGS:  Decreased breath sounds.  Clear to auscultation. CARDIAC:  Regular rate and rhythm. ABDOMEN:  Soft, nontender.  Good bowel sounds.  REHABILITATION HOSPITAL COURSE:  The patient was admitted to Inpatient Rehab Services with therapies initiated on a 3-hour daily basis consisting of physical therapy, occupational therapy, speech therapy, and rehabilitation nursing.  The following issues were addressed during the patient's rehabilitation stay.  Pertaining to Mr. Andreoli, right cerebellar subcortical infarct remained stable, maintained on aspirin and Plavix therapy.  He will continue this until followup with Neurology Services, Dr. Erlinda Hong and after 3 months would be on just Plavix alone.  He continued on subcutaneous Lovenox for DVT prophylaxis.  No bleeding episodes.  Blood pressure is well controlled on  Coreg as well as BiDil. He had undergone implantation of a loop recorder per Dr. Rayann Heman and would follow up as an outpatient.  He did have a history of diabetes mellitus, peripheral neuropathy.  Hemoglobin A1c is 7.9.  He was on Amaryl 1 mg daily, monitored closely.  He had been on Glucophage prior to admission.  This was held due to some  elevated creatinine around 1.5. He had some mild urinary retention, felt to be related to BPH, emptying much better as mobility improved.  He continued on Flomax.  His diet had been advanced to a mechanical soft with thin liquids.  The patient received weekly collaborative interdisciplinary team conferences to discuss estimated length of stay, family teaching, and any barriers to discharge.  Sessions focused on wheelchair propulsion on the unit supervision, simulated car transfers to truck height with a rolling walker, minimal assist, stair negotiation with bilateral rails for home and community mobility with minimal assistance.  Activities of daily living; he completed shaving task seated wheelchair with supervision. He could stand from the matt with a rolling walker.  Demonstrates functional standing balance tolerating 5 minutes of standing before requiring a rest break.  Due to limited assistance at home, it was felt skilled nursing facility was needed with bed becoming available May 04, 2014.  DISCHARGE MEDICATIONS:  Included, 1. Aspirin 81 mg p.o. daily. 2. Lipitor 40 mg p.o. daily. 3. Coreg 6.25 mg p.o. b.i.d. 4. Plavix 75 mg p.o. daily. 5. Amaryl 1 mg p.o. daily. 6. BiDil 20-37.5 mg 1 tablet p.o. t.i.d. 7. Lyrica 50 mg p.o. b.i.d. 8. Flomax 0.4 mg p.o. daily.  DIET:  His diet was mechanical soft thin liquids, 1800 calorie ADA.  FOLLOWUP:  The patient would follow up with Dr. Meredith Staggers at the Outpatient Rehab Service office, July 02, 2014, arrival 11:20 a.m.; Dr. Rayann Heman, Cardiology Services, call for appointment in regard to loop recorder; Dr. Rosalin Hawking, Neurology Services, call for appointment 1 month.     Lauraine Rinne, P.A.   ______________________________ Meredith Staggers, M.D.    DA/MEDQ  D:  05/03/2014  T:  05/03/2014  Job:  [id number]

## 2014-05-03 NOTE — Progress Notes (Signed)
Occupational Therapy Discharge Summary  Patient Details  Name: Hayden Mckinney MRN: [id number] Date of Birth: [birth year]   Patient has met 8 of 8 long term goals due to improved activity tolerance, improved balance, postural control, functional use of  RIGHT upper extremity, improved attention, improved awareness and improved coordination.  Patient to discharge at [facility] Assist level.  Patient discharging to SNF.   Recommendation:  Patient will benefit from ongoing skilled OT services in skilled nursing facility setting to continue to advance functional skills in the area of BADL, iADL and Reduce care partner burden.  Equipment: No equipment provided  Reasons for discharge: treatment goals met and discharge from hospital  Patient/family agrees with progress made and goals achieved: Yes  OT Discharge Precautions/Restrictions  Precautions Precautions: Fall Restrictions Weight Bearing Restrictions: No Pain Pain Assessment Pain Assessment: No/denies pain Vision/Perception  Vision- History Baseline Vision/History: Wears glasses Wears Glasses: At all times Patient Visual Report: No change from baseline Vision- Assessment Vision Assessment?: No apparent visual deficits  Cognition Overall Cognitive Status: Impaired/Different from baseline Arousal/Alertness: Awake/alert Orientation Level: Oriented X4 Attention: Alternating Alternating Attention: Appears intact Divided Attention: Appears intact Memory: Impaired Memory Impairment: Decreased recall of new information Awareness: Impaired Awareness Impairment: Intellectual impairment Problem Solving: Impaired Problem Solving Impairment: Verbal complex;Functional complex Executive Function: Decision Making;Self Monitoring;Reasoning Reasoning: Impaired Reasoning Impairment: Verbal basic Decision Making: Impaired Decision Making Impairment: Functional basic;Verbal complex Self Monitoring: Impaired Self Monitoring  Impairment: Functional basic;Verbal basic Behaviors: Impulsive Safety/Judgment: Impaired Comments: Pt continues to be impulsive with mobility and demonstrate decreased safety awareness/ need for assist. Sensation Sensation Light Touch: Appears Intact Light Touch Impaired Details: Impaired RLE;Impaired LUE Proprioception: Impaired Detail Proprioception Impaired Details: Impaired RLE Coordination Gross Motor Movements are Fluid and Coordinated: No Fine Motor Movements are Fluid and Coordinated: No Coordination and Movement Description: Pt with decreased fine motor abilities in B UEs, L side from previous injury.  Motor  Motor Motor: Ataxia;Motor impersistence;Abnormal postural alignment and control;Other (comment) (R hemiparesis) Motor - Discharge Observations: Pt with flexed posture and decreased coordination of LEs during functional ambulation Mobility  Bed Mobility Bed Mobility: Sit to Supine;Supine to Sit Rolling Left: 5: Supervision Supine to Sit: 5: Supervision Sit to Supine: 6: Modified independent (Device/Increase time) Transfers Transfers: Sit to Stand;Stand to Sit Sit to Stand: 5: Supervision Sit to Stand Details (indicate cue type and reason): Supervision for safety due to impulsivity Stand to Sit: 5: Supervision Stand to Sit Details: Supervision for safety due to impulsivity  Trunk/Postural Assessment  Cervical Assessment Cervical Assessment: Within Functional Limits Thoracic Assessment Thoracic Assessment:  (Flexed posture) Lumbar Assessment Lumbar Assessment: Exceptions to [facility] (Posterior pelvic tilt) Postural Control Postural Control: Deficits on evaluation Postural Limitations: Pt continues to demonstrate impaired postural control and delayed and inadequate balance reactions  Balance Balance Balance Assessed: Yes Static Sitting Balance Static Sitting - Level of Assistance: 5: Stand by assistance Dynamic Sitting Balance Dynamic Sitting - Level of Assistance:  5: Stand by assistance Static Standing Balance Static Standing - Level of Assistance: 5: Stand by assistance Dynamic Standing Balance Dynamic Standing - Level of Assistance: 4: Min assist;5: Stand by assistance (Pt able to complete functional tasks requiring dynamic standing with one UE supported) Extremity/Trunk Assessment RUE Assessment RUE Assessment: Exceptions to [facility] RUE Strength RUE Overall Strength:  (4/5 MMT grade) LUE Assessment LUE Assessment: Exceptions to WFL LUE AROM (degrees) Overall AROM Left Upper Extremity: Within functional limits for tasks assessed LUE Strength LUE Overall Strength: Due to premorbid status (  Pt with limitations in L hand strength due to previous injury, however, strength is [facility])  See FIM for current functional status  Hayden Mckinney, Hayden Mckinney 05/03/2014, 4:24 PM

## 2014-05-03 NOTE — Progress Notes (Signed)
Occupational Therapy Note  Patient Details  Name: Hayden Mckinney MRN: [id number] Date of Birth: Feb 01, 1945  Today's Date: 05/03/2014 OT Group Time: 1150-1205(cotx with SLP-total time [id number]) OT Group Time Calculation (min): 15 min   Pt denied pain Group Therapy  Pt participated in self feeding group with focus on BUE use for self feeding and setup, attention to left, rate of feeding, and portion control.  Pt used BUE appropriately throughout meal.  Pt required min verbal cues for portion control and rate of self feeding.   Leotis Shames [facility] 05/03/2014, 2:50 PM

## 2014-05-03 NOTE — Progress Notes (Signed)
[place] PHYSICAL MEDICINE & REHABILITATION     PROGRESS NOTE    Subjective/Complaints: Had a pretty unremarkable weekend. Slept well. No pain.       Objective: Vital Signs: Blood pressure 122/50, pulse 72, temperature 98.3 F (36.8 C), temperature source Oral, resp. rate 19, weight 65.1 kg (143 lb 8.3 oz), SpO2 95 %. No results found. No results for input(s): WBC, HGB, HCT, PLT in the last 72 hours. No results for input(s): NA, K, CL, GLUCOSE, BUN, CREATININE, CALCIUM in the last 72 hours.  Invalid input(s): CO CBG (last 3)   Recent Labs  05/02/14 1731 05/02/14 2052 05/03/14 0640  GLUCAP 120* 150* 99    Wt Readings from Last 3 Encounters:  04/27/14 65.1 kg (143 lb 8.3 oz)  04/16/14 67.1 kg (147 lb 14.9 oz)  12/14/11 76.613 kg (168 lb 14.4 oz)    Physical Exam:   Gen:no acute distress,  HENT:edentulous. Oral mucosa pink and moist Head: Normocephalic.  Eyes: EOM are normal.  Neck: Normal range of motion. Neck supple. No thyromegaly present.  Cardiovascular: Normal rate and regular rhythm.no murmurs or rubs  Respiratory: Effort normal and breath sounds normal. No respiratory distress. no wheezes GI: Soft. Bowel sounds are normal. He exhibits no distension. Non-tender Neurological: He is alert.  Speech is  dysarthric but somewhat intelligibile. He is able to provide his name and age as well as date of birth. Followed simple commands. Right limb ataxia persistent, right PD. RUE motor 4 to 4+/5 delt,bic,tri,wrist, hand. RLE: 4/5 hf,ke. 0 to trace ADF and 3-4/5 APF. Sensation intact to pain on right side Skin: Skin is warm and dry.  Psych: pt pleasant and appropriate  Assessment/Plan: 1. 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 Physiatrist is providing close team supervision and 24 hour management of  active medical problems listed below. Physiatrist and rehab team continue to assess barriers to discharge/monitor patient progress toward functional and medical goals.  Seeking placement given projected care needs/dispo  FIM: FIM - Bathing Bathing Steps Patient Completed: Buttocks Bathing: 4: Steadying assist  FIM - Upper Body Dressing/Undressing Upper body dressing/undressing steps patient completed: Pull shirt over trunk, Thread/unthread right sleeve of pullover shirt/dresss, Thread/unthread left sleeve of pullover shirt/dress, Put head through opening of pull over shirt/dress Upper body dressing/undressing: 5: Supervision: Safety issues/verbal cues FIM - Lower Body Dressing/Undressing Lower body dressing/undressing steps patient completed: Thread/unthread right pants leg, Thread/unthread left pants leg, Pull pants up/down, Fasten/unfasten pants, Don/Doff right shoe, Don/Doff left shoe, Fasten/unfasten right shoe, Fasten/unfasten left shoe Lower body dressing/undressing: 4: Steadying Assist  FIM - Toileting Toileting steps completed by patient: Adjust clothing prior to toileting, Performs perineal hygiene, Adjust clothing after toileting Toileting Assistive Devices: Grab bar or rail for support Toileting: 4: Steadying assist  FIM - Radio producer Devices: Grab bars, Elevated toilet seat Toilet Transfers: 4-To toilet/BSC: Min A (steadying Pt. > 75%)  FIM - Control and instrumentation engineer Devices: Arm rests, Copy: 4: Bed > Chair or W/C: Min A (steadying Pt. > 75%), 4: Chair or W/C > Bed: Min A (steadying Pt. > 75%)  FIM - Locomotion: Wheelchair Distance: 150 Locomotion: Wheelchair: 5: Travels 150 ft or more: maneuvers on rugs and over door sills with supervision, cueing or coaxing FIM - Locomotion: Ambulation Locomotion: Ambulation Assistive Devices: Administrator Ambulation/Gait Assistance: 4: Min assist, 3: Mod  assist Locomotion: Ambulation: 3: Travels 150 ft or more with moderate assistance (Pt: 50 - 74%)  Comprehension Comprehension Mode: Auditory Comprehension: 5-Understands basic 90% of the time/requires cueing < 10% of the time  Expression Expression Mode: Verbal Expression: 3-Expresses basic 50 - 74% of the time/requires cueing 25 - 50% of the time. Needs to repeat parts of sentences.  Social Interaction Social Interaction: 4-Interacts appropriately 75 - 89% of the time - Needs redirection for appropriate language or to initiate interaction.  Problem Solving Problem Solving: 4-Solves basic 75 - 89% of the time/requires cueing 10 - 24% of the time  Memory Memory: 4-Recognizes or recalls 75 - 89% of the time/requires cueing 10 - 24% of the time  Medical Problem List and Plan: 1. 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 DVT Prophylaxis/Anticoagulation: Subcutaneous Lovenox. Monitor platelet counts and any signs of bleeding. Venous Dopplers negative 3. Pain Management: Lyrica 50 mg twice a day Tylenol as needed 4. Hypertension. Coreg 6.25 mg twice a day,BIDIL 20-30 7.5 mg 3 times a day. Monitor with increased mobility  5. Neuropsych: This patient is capable of making decisions on his own behalf. 6. Skin/Wound Care: Routine skin checks  7. Fluids/Electrolytes/Nutrition: encourage adequate po 8. Diabetes mellitus with peripheral neuropathy. Latest hemoglobin A1c 7.9. Presently with sliding scale insulin. Patient on Glucophage 1000 mg twice a day prior to admission--holding given Cr.   -amaryl decreased to 1mg  given hypoglycemia---now under tight control 9. Question left lower lobe pneumonia. abx stopped. afebrile 10. Acute on chronic renal insufficiency. Cr around 1.5  -neurogenic bladder/urine retention---ua neg, culture negative   -added flomax   -emptying well now 11. Dysphagia. Diet changed to mechanical soft nectar liquids after  modified barium swallow 04/21/2014.    -tolerating thus far   LOS (Days) 12 A FACE TO FACE EVALUATION WAS PERFORMED  SWARTZ,ZACHARY T 05/03/2014 8:01 AM

## 2014-05-03 NOTE — Progress Notes (Signed)
Physical Therapy Session Note  Patient Details  Name: Hayden Mckinney MRN: [id number] Date of Birth: [birth year]  Today's Date: 05/03/2014 PT Individual Time: 0800-0900 PT Individual Time Calculation (min): 60 min   Short Term Goals: Week 1:  PT Short Term Goal 1 (Week 1): Pt will be able to transfer with min A for basic transfers PT Short Term Goal 1 - Progress (Week 1): Met PT Short Term Goal 2 (Week 1): Pt will be able to demonstrate dynamic standing balance with min A PT Short Term Goal 2 - Progress (Week 1): Met (goal met with UE support) PT Short Term Goal 3 (Week 1): Pt will be able to gait with mod A x 50' PT Short Term Goal 3 - Progress (Week 1): Met PT Short Term Goal 4 (Week 1): Pt will be able to propel w/c x 100' with S around obstacles PT Short Term Goal 4 - Progress (Week 1): Met  Skilled Therapeutic Interventions/Progress Updates:    Session focused on functional w/c propulsion on unit with S, simulated car transfer to truck height with RW with min A and cues for technique, stair negotiation with bilateral rails for home and community mobility with min A (1 episode of R knee buckling but pt able to sustain balance with rails), neuro re-ed for BLE coordination and balance in standing and introduced [place] HEP. HEP for [place] exercises to address strength and balance. Pt performed 15 reps with 2 # ankle weight  for BLE including LAQ with 5 second hold, standing hip abduction, standing hamstring curls, mini squats, and standing heel/toe raises. Coordination activity to address motor control and coordination/balance in standing to perform toe taps to pattern verbalized by therapist with UE support with close S/steady A with BLE. Gait training in controlled and dynamic environment with cues for safety with RW in straight line and navigating obstacles with min A overall. Toileting at end of session with overall steady A for transfers and balance using grab bars for support. Left with  nurse tech in room at end of session.    Therapy Documentation Precautions:  Precautions Precautions: Fall Restrictions Weight Bearing Restrictions: No   Pain:  Denies pain.   See FIM for current functional status  Therapy/Group: Individual Therapy  Canary Brim Ivory Broad, PT, DPT  05/03/2014, 9:06 AM

## 2014-05-03 NOTE — Plan of Care (Signed)
Problem: RH Memory Goal: LTG Patient will use memory compensatory aids to (SLP) LTG: Patient will use memory compensatory aids to recall biographical/new, daily complex information with cues (SLP)  Outcome: Not Met (add Reason) Pt continues to require min assist for use of memory aids      Problem: RH Awareness Goal: LTG: Patient will demonstrate intellectual/emergent (SLP) LTG: Patient will demonstrate intellectual/emergent/anticipatory awareness with assist during a cognitive/linguistic activity (SLP)  Outcome: Not Met (add Reason) Pt continues to present with decreased intellectual awareness of deficits and is impulsive with mobility, requires quick release belt and can not be left in room alone

## 2014-05-03 NOTE — Progress Notes (Signed)
Social Work Patient ID: Hayden Mckinney, male   DOB: 12-26-1945, 69 y.o.   MRN: [id number]   Have received SNF bed offer today from [facility]. Pt and sister aware and accepting bed offer.  Have notified tx team and preparing for d/c with transfer via ambulance.  Elzie Knisley, LCSW

## 2014-05-04 ENCOUNTER — Inpatient Hospital Stay (HOSPITAL_COMMUNITY): Payer: Medicare Other

## 2014-05-04 LAB — GLUCOSE, CAPILLARY
GLUCOSE-CAPILLARY: 106 mg/dL — AB (ref 70–99)
Glucose-Capillary: 118 mg/dL — ABNORMAL HIGH (ref 70–99)

## 2014-05-04 NOTE — Progress Notes (Signed)
Social Work  Discharge Note  The overall goal for the admission was met for:   Discharge location: No - plan changed to SNF as family cannot provide needed 24/7 min assist  Length of Stay: Yes - 13 days (ELOS was 10-14 d)  Discharge activity level: Yes - minimal assistance  Home/community participation: Yes  Services provided included: MD, RD, PT, OT, SLP, RN, TR, Pharmacy and SW  Financial Services: Medicare and Medicaid  Follow-up services arranged: Other: SNF @ [facility]  Comments (or additional information):  Patient/Family verbalized understanding of follow-up arrangements: Yes  Individual responsible for coordination of the follow-up plan: pt with assist of 1/2 sister, Vivien Rota  Confirmed correct DME delivered: NA    Jekhi Bolin

## 2014-05-04 NOTE — Progress Notes (Signed)
Patient discharged at 1450 with care link to [facility]. Report called in to Rayle at 1315. Pt stable and ready to transfer.

## 2014-05-04 NOTE — Progress Notes (Signed)
[place] PHYSICAL MEDICINE & REHABILITATION     PROGRESS NOTE    Subjective/Complaints: No complaints..       Objective: Vital Signs: Blood pressure 137/63, pulse 73, temperature 98.3 F (36.8 C), temperature source Oral, resp. rate 18, weight 65.1 kg (143 lb 8.3 oz), SpO2 96 %. No results found. No results for input(s): WBC, HGB, HCT, PLT in the last 72 hours. No results for input(s): NA, K, CL, GLUCOSE, BUN, CREATININE, CALCIUM in the last 72 hours.  Invalid input(s): CO CBG (last 3)   Recent Labs  05/03/14 1641 05/03/14 2133 05/04/14 0653  GLUCAP 89 135* 106*    Wt Readings from Last 3 Encounters:  04/27/14 65.1 kg (143 lb 8.3 oz)  04/16/14 67.1 kg (147 lb 14.9 oz)  12/14/11 76.613 kg (168 lb 14.4 oz)    Physical Exam:   Gen:no acute distress,  HENT:edentulous. Oral mucosa pink and moist Head: Normocephalic.  Eyes: EOM are normal.  Neck: Normal range of motion. Neck supple. No thyromegaly present.  Cardiovascular: Normal rate and regular rhythm.no murmurs or rubs  Respiratory: Effort normal and breath sounds normal. No respiratory distress. no wheezes GI: Soft. Bowel sounds are normal. He exhibits no distension. Non-tender Neurological: He is alert.  Speech is  dysarthric but somewhat intelligibile. He is able to provide his name and age as well as date of birth. Followed simple commands. Right limb ataxia persistent, right PD. RUE motor 4 to 4+/5 delt,bic,tri,wrist, hand. RLE: 4/5 hf,ke. 0 to trace ADF and 3-4/5 APF. Sensation intact to pain on right side Skin: Skin is warm and dry.  Psych: pt pleasant and appropriate  Assessment/Plan: 1. 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 Physiatrist is providing close team supervision and 24 hour management of active medical problems listed  below. Physiatrist and rehab team continue to assess barriers to discharge/monitor patient progress toward functional and medical goals.  Placement pending. Follow up with me in a month  FIM: FIM - Bathing Bathing Steps Patient Completed: Chest, Right Arm, Left Arm, Buttocks, Front perineal area, Abdomen, Right upper leg, Left lower leg (including foot), Left upper leg, Right lower leg (including foot) Bathing: 5: Supervision: Safety issues/verbal cues  FIM - Upper Body Dressing/Undressing Upper body dressing/undressing steps patient completed: Pull shirt over trunk, Thread/unthread right sleeve of pullover shirt/dresss, Thread/unthread left sleeve of pullover shirt/dress, Put head through opening of pull over shirt/dress Upper body dressing/undressing: 5: Supervision: Safety issues/verbal cues FIM - Lower Body Dressing/Undressing Lower body dressing/undressing steps patient completed: Thread/unthread right pants leg, Thread/unthread left pants leg, Pull pants up/down, Fasten/unfasten pants, Don/Doff right shoe, Don/Doff left shoe, Fasten/unfasten right shoe, Fasten/unfasten left shoe Lower body dressing/undressing: 4: Steadying Assist  FIM - Toileting Toileting steps completed by patient: Adjust clothing prior to toileting, Performs perineal hygiene, Adjust clothing after toileting Toileting Assistive Devices: Grab bar or rail for support Toileting: 4: Steadying assist  FIM - Radio producer Devices: Grab bars, Elevated toilet seat Toilet Transfers: 4-To toilet/BSC: Min A (steadying Pt. > 75%), 4-From toilet/BSC: Min A (steadying Pt. > 75%)  FIM - Bed/Chair Transfer Bed/Chair Transfer Assistive Devices: Arm rests Bed/Chair Transfer: 6: Sit > Supine: No assist, 4: Chair or W/C > Bed: Min A (steadying Pt. > 75%)  FIM - Locomotion: Wheelchair Distance: 150 Locomotion: Wheelchair: 5: Travels 150 ft or more: maneuvers on rugs and over door sills  with  supervision, cueing or coaxing FIM - Locomotion: Ambulation Locomotion: Ambulation Assistive Devices: Walker - Rolling Ambulation/Gait Assistance: 4: Min assist Locomotion: Ambulation: 2: Travels 50 - 149 ft with minimal assistance (Pt.>75%)  Comprehension Comprehension Mode: Auditory Comprehension: 5-Understands basic 90% of the time/requires cueing < 10% of the time  Expression Expression Mode: Verbal Expression: 4-Expresses basic 75 - 89% of the time/requires cueing 10 - 24% of the time. Needs helper to occlude trach/needs to repeat words.  Social Interaction Social Interaction: 4-Interacts appropriately 75 - 89% of the time - Needs redirection for appropriate language or to initiate interaction.  Problem Solving Problem Solving: 5-Solves basic 90% of the time/requires cueing < 10% of the time  Memory Memory: 4-Recognizes or recalls 75 - 89% of the time/requires cueing 10 - 24% of the time  Medical Problem List and Plan: 1. 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 DVT Prophylaxis/Anticoagulation: Subcutaneous Lovenox. Monitor platelet counts and any signs of bleeding. Venous Dopplers negative 3. Pain Management: Lyrica 50 mg twice a day Tylenol as needed 4. Hypertension. Coreg 6.25 mg twice a day,BIDIL 20-30 7.5 mg 3 times a day. Monitor with increased mobility  5. Neuropsych: This patient is capable of making decisions on his own behalf. 6. Skin/Wound Care: Routine skin checks  7. Fluids/Electrolytes/Nutrition: encourage adequate po 8. Diabetes mellitus with peripheral neuropathy. Latest hemoglobin A1c 7.9. Presently with sliding scale insulin. Patient on Glucophage 1000 mg twice a day prior to admission--holding given Cr.   -amaryl decreased to 1mg  given hypoglycemia---now under tight control 9. Question left lower lobe pneumonia. abx stopped. afebrile 10. Acute on chronic renal insufficiency. Cr around 1.5  -neurogenic  bladder/urine retention---ua neg, culture negative   -added flomax  11. Dysphagia. Diet changed to mechanical soft nectar liquids after modified barium swallow 04/21/2014.    -tolerating thus far   LOS (Days) 13 A FACE TO FACE EVALUATION WAS PERFORMED  SWARTZ,ZACHARY T 05/04/2014 8:04 AM

## 2014-05-04 NOTE — Plan of Care (Signed)
Problem: RH Bed to Chair Transfers Goal: LTG Patient will perform bed/chair transfers w/assist (PT) LTG: Patient will perform bed/chair transfers with assistance, with/without cues (PT).  Outcome: Adequate for Discharge Pt requires min A for transfers.

## 2014-05-05 ENCOUNTER — Encounter: Payer: Self-pay | Admitting: Internal Medicine

## 2014-05-05 ENCOUNTER — Non-Acute Institutional Stay (SKILLED_NURSING_FACILITY): Payer: Medicare Other | Admitting: Adult Health

## 2014-05-05 ENCOUNTER — Telehealth: Payer: Self-pay | Admitting: *Deleted

## 2014-05-05 ENCOUNTER — Encounter: Payer: Self-pay | Admitting: Adult Health

## 2014-05-05 DIAGNOSIS — I10 Essential (primary) hypertension: Secondary | ICD-10-CM | POA: Diagnosis not present

## 2014-05-05 DIAGNOSIS — I639 Cerebral infarction, unspecified: Secondary | ICD-10-CM

## 2014-05-05 DIAGNOSIS — E785 Hyperlipidemia, unspecified: Secondary | ICD-10-CM

## 2014-05-05 DIAGNOSIS — R339 Retention of urine, unspecified: Secondary | ICD-10-CM | POA: Diagnosis not present

## 2014-05-05 DIAGNOSIS — E1140 Type 2 diabetes mellitus with diabetic neuropathy, unspecified: Secondary | ICD-10-CM | POA: Diagnosis not present

## 2014-05-05 DIAGNOSIS — E1149 Type 2 diabetes mellitus with other diabetic neurological complication: Secondary | ICD-10-CM

## 2014-05-05 DIAGNOSIS — G629 Polyneuropathy, unspecified: Secondary | ICD-10-CM | POA: Diagnosis not present

## 2014-05-05 DIAGNOSIS — N179 Acute kidney failure, unspecified: Secondary | ICD-10-CM

## 2014-05-05 NOTE — Progress Notes (Signed)
Patient ID: Hayden Mckinney, male   DOB: 1946/01/03, 68 y.o.   MRN: [id number]   05/05/2014  Facility:  Nursing Home Location:  [place] Room Number: 101-P LEVEL OF CARE:  SNF (31)   Chief Complaint  Patient presents with  . Hospitalization Follow-up    Cerebral infarct, neuropathy, urinary retention, hypertension, hyperlipidemia, diabetes mellitus and acute on chronic renal insufficiency    HISTORY OF PRESENT ILLNESS:  This is a 69 year old male who has been admitted to [facility] on 05/04/14 from [facility]. He has PMH of hypertension, TIA, hyperlipidemia and diabetes mellitus. He presented to the hospital on 04/16/14 with right-sided weakness and slurred speech 2 weeks. MRI showed moderate size acute subacute nonhemorrhagic infarct, mid superior right cerebellar with local mass effect as well as tiny nonhemorrhagic infarct, right lenticular nucleus, mid right coronal radiata. Echocardiogram with EF 60%. 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 TEE EF 65%. He had loop recorder implantation on 04/20/14. He had a comprehensive rehab program @ [facility].  He was seen in his room today. He reports having slight weakness on RUE and RLE, 4/5. His speech is dysarthric but intelligible.  He has been admitted for a short-term rehabilitation.  PAST MEDICAL HISTORY:  Past Medical History  Diagnosis Date  . Type II diabetes mellitus   . Sarcoma     a. R leg  . Diabetic peripheral neuropathy     a. both feet  . TIA (transient ischemic attack)     a. 08/2010.  Marland Kitchen Hyperlipidemia   . Hypertension   . Stroke     a. 04/2014 multiple bateral infarcts, presumed to be embolic;  b. [id number] Carotid U/S: 1-39% bilat ICA stenosis.  . H/O echocardiogram     a. 04/2014 Echo: EF 55-60%, no rwma.  Marland Kitchen ETOH abuse     a. 04/2014 18-24 beers q weekend.    CURRENT MEDICATIONS: Reviewed per MAR/see medication list  No Known  Allergies   REVIEW OF SYSTEMS:  GENERAL: no change in appetite, no fatigue, no weight changes, no fever, chills or weakness RESPIRATORY: no cough, SOB, DOE, wheezing, hemoptysis CARDIAC: no chest pain, edema or palpitations GI: no abdominal pain, diarrhea, constipation, heart burn, nausea or vomiting  PHYSICAL EXAMINATION  GENERAL: no acute distress, normal body habitus EYES: conjunctivae normal, sclerae normal, normal eye lids NECK: supple, trachea midline, no neck masses, no thyroid tenderness, no thyromegaly LYMPHATICS: no LAN in the neck, no supraclavicular LAN RESPIRATORY: breathing is even & unlabored, BS CTAB CARDIAC: RRR, no murmur,no extra heart sounds, no edema GI: abdomen soft, normal BS, no masses, no tenderness, no hepatomegaly, no splenomegaly EXTREMITIES:  RUE and RLE 4/5 strength NEURO:  Dysarthric speech but intelligible, weakness on RUE and RLE PSYCHIATRIC: the patient is alert & oriented to person, affect & behavior appropriate  LABS/RADIOLOGY: Labs reviewed: Basic Metabolic Panel:  Recent Labs  04/19/14 0658 04/20/14 0714 04/21/14 0434 04/21/14 1715  NA 137 141 143  --   K 3.7 3.6 3.7  --   CL 109 110 115*  --   CO2 25 27 22   --   GLUCOSE 167* 179* 148*  --   BUN 24* 26* 25*  --   CREATININE 1.36* 1.55* 1.54* 1.53*  CALCIUM 8.4 8.8 8.3*  --    CBC:  Recent Labs  04/20/14 1340 04/21/14 0434 04/21/14 1715 04/22/14 0639  WBC 5.9 5.7 6.0 5.4  NEUTROABS 3.5  --   --   --  HGB 12.0* 10.6* 11.1* 10.2*  HCT 36.4* 31.9* 33.8* 31.2*  MCV 84.5 84.4 85.1 86.7  PLT 232 212 231 189   Lipid Panel:  Recent Labs  04/17/14 1543  HDL 37*   CBG:  Recent Labs  05/03/14 2133 05/04/14 0653 05/04/14 1148  GLUCAP 135* 106* 118*    Ct Angio Head W/cm &/or Wo Cm  04/19/2014   CLINICAL DATA:  Slurred speech and RIGHT-sided weakness for 2 weeks, worsening speech difficulties today. History of hypertension, diabetes, hyperlipidemia, sarcoma.  EXAM: CT  ANGIOGRAPHY HEAD AND NECK  TECHNIQUE: Multidetector CT imaging of the head and neck was performed using the standard protocol during bolus administration of intravenous contrast. Multiplanar CT image reconstructions and MIPs were obtained to evaluate the vascular anatomy. Carotid stenosis measurements (when applicable) are obtained utilizing NASCET criteria, using the distal internal carotid diameter as the denominator.  CONTRAST:  55mL OMNIPAQUE IOHEXOL 350 MG/ML SOLN  COMPARISON:  MRI of the head April 16 2014  FINDINGS: CT HEAD  The ventricles and sulci are normal for age. No intraparenchymal hemorrhage, mass effect nor midline shift. Patchy supratentorial white matter hypodensities are within normal range for patient's age and though non-specific suggest sequelae of chronic small vessel ischemic disease. Wedge-like hypodensity RIGHT mid to superior cerebellum. RIGHT basal ganglia lacunar infarct was present previously, similar appearance of bilateral thalamus small lacunar infarcts. No abnormal intracranial enhancement.  No abnormal extra-axial fluid collections. Basal cisterns are patent. Minimal calcific atherosclerosis of the carotid siphons.  No skull fracture. Persistent metopic suture. The included ocular globes and orbital contents are non-suspicious. The mastoid aircells and included paranasal sinuses are well-aerated. Patient is edentulous.  CTA NECK  Normal appearance of the thoracic arch, normal branch pattern. The origins of the innominate, left Common carotid artery and subclavian artery are widely patent, intimal thickening partially obscured by streak artifact from retained LEFT subclavian venous contrast.  Bilateral Common carotid arteries are widely patent, coursing in a straight line fashion. Calcific atherosclerosis and eccentric intimal hematoma results in approximately 60% narrowing of RIGHT internal carotid artery by NASCET criteria, approximate 13 mm segment of stenosis beginning at the  origin. Normal appearance of the mid to distal included internal carotid arteries.  Reflux of contrast limits evaluation the vertebral artery origins. Left vertebral artery is dominant. Suspected mid to high-grade stenosis the origin of LEFT vertebral artery with mild poststenotic dilatation.  No dissection, no pseudoaneurysm. No abnormal luminal irregularity. No contrast extravasation.  Soft tissues are unremarkable. No acute osseous process though bone windows have not been submitted.  CTA HEAD  Anterior circulation: Patent cervical internal carotid arteries, petrous, cavernous and supra clinoid internal carotid arteries. Intimal thickening results in at least mid grade stenosis anterior genu of the RIGHT carotid siphon. Widely patent anterior communicating artery. Congenitally dominant LEFT A1 segment. Suspected occlusion of RIGHT A1 segment. High-grade stenosis of proximal RIGHT M2 branch. Moderate luminal irregularity of the anterior and middle cerebral arteries.  Posterior circulation: LEFT vertebral artery is dominant. Mid grade stenosis of RIGHT vertebral artery which predominately in terminates in the RIGHT posterior inferior cerebellar artery, with occluded distal thready RIGHT vertebral artery. Mild stenosis LEFT vertebral artery. Widely patent basilar artery. High-grade stenosis suspected of RIGHT superior cerebellar artery origin with moderate luminal irregularity of the RIGHT greater than LEFT superior cerebellar arterys. RIGHT vertebral artery terminates in the posterior inferior cerebellar artery. Patent posterior cerebral arteries with multifocal mid high-grade stenosis of the LEFT P2 segment. High-grade stenosis of RIGHT  P1 segment with thready RIGHT posterior communicating artery present, moderate luminal regularity of the mid to distal RIGHT posterior cerebral artery.  No large vessel occlusion, dissection, luminal irregularity, contrast extravasation or aneurysm within the anterior nor posterior  circulation.  IMPRESSION: CT HEAD: Evolving RIGHT superior cerebellar artery territory infarct.  Remote RIGHT basal ganglion and bilateral thalamus lacunar infarcts. Mild white matter changes suggest chronic small vessel ischemic disease.  CTA NECK: Approximately 60% stenosis RIGHT internal carotid artery by NASCET criteria. No large vessel occlusion.  CTA HEAD: Mid grade stenosis of RIGHT anterior genu of the carotid siphon, suspected occlusion of RIGHT A1 segment with patent A-comm artery. High-grade stenosis of proximal RIGHT M2 branch.  Mid grade stenosis of RIGHT vertebral artery, which predominately at terminates in the RIGHT pack of, and included distal thready RIGHT vertebral artery. Suspected high-grade stenosis of the RIGHT superior cerebellar artery origin. Multifocal high-grade stenosis of LEFT P2 segment. High-grade stenosis of RIGHT P1 segment with RIGHT posterior communicating artery present.  Moderate luminal regularity of the cerebral arteries most consistent with atherosclerosis.   Electronically Signed   By: Elon Alas   On: 04/19/2014 04:03   Ct Head Wo Contrast  04/16/2014   CLINICAL DATA:  Right-sided weakness with slurred speech.  EXAM: CT HEAD WITHOUT CONTRAST  TECHNIQUE: Contiguous axial images were obtained from the base of the skull through the vertex without intravenous contrast.  COMPARISON:  CT 08/07/2010 as well as MRI 08/07/2010.  FINDINGS: Ventricles, cisterns and other CSF spaces are within normal. There is mild chronic ischemic microvascular disease. There are several small old lacune infarct over the base a ganglia bilaterally. There is no oval 1.7 cm hypodensity extending from the region of the right head of caudate nucleus to the lentiform nucleus which may be a subacute to chronic focal infarct. There is no focal mass, mass effect or shift of midline structures. There is no evidence of acute hemorrhage. Remaining bony and soft tissue structures are within normal.   IMPRESSION: 1.7 cm focal hypodensity over the right basal ganglia/internal capsule which may be a subacute to chronic infarct. Several other smaller bilateral old basal ganglia lacunar infarcts.  Chronic ischemic microvascular disease.   Electronically Signed   By: Marin Olp M.D.   On: 04/16/2014 15:37   Ct Angio Neck W/cm &/or Wo/cm  04/19/2014   CLINICAL DATA:  Slurred speech and RIGHT-sided weakness for 2 weeks, worsening speech difficulties today. History of hypertension, diabetes, hyperlipidemia, sarcoma.  EXAM: CT ANGIOGRAPHY HEAD AND NECK  TECHNIQUE: Multidetector CT imaging of the head and neck was performed using the standard protocol during bolus administration of intravenous contrast. Multiplanar CT image reconstructions and MIPs were obtained to evaluate the vascular anatomy. Carotid stenosis measurements (when applicable) are obtained utilizing NASCET criteria, using the distal internal carotid diameter as the denominator.  CONTRAST:  42mL OMNIPAQUE IOHEXOL 350 MG/ML SOLN  COMPARISON:  MRI of the head April 16 2014  FINDINGS: CT HEAD  The ventricles and sulci are normal for age. No intraparenchymal hemorrhage, mass effect nor midline shift. Patchy supratentorial white matter hypodensities are within normal range for patient's age and though non-specific suggest sequelae of chronic small vessel ischemic disease. Wedge-like hypodensity RIGHT mid to superior cerebellum. RIGHT basal ganglia lacunar infarct was present previously, similar appearance of bilateral thalamus small lacunar infarcts. No abnormal intracranial enhancement.  No abnormal extra-axial fluid collections. Basal cisterns are patent. Minimal calcific atherosclerosis of the carotid siphons.  No skull fracture. Persistent metopic  suture. The included ocular globes and orbital contents are non-suspicious. The mastoid aircells and included paranasal sinuses are well-aerated. Patient is edentulous.  CTA NECK  Normal appearance of the  thoracic arch, normal branch pattern. The origins of the innominate, left Common carotid artery and subclavian artery are widely patent, intimal thickening partially obscured by streak artifact from retained LEFT subclavian venous contrast.  Bilateral Common carotid arteries are widely patent, coursing in a straight line fashion. Calcific atherosclerosis and eccentric intimal hematoma results in approximately 60% narrowing of RIGHT internal carotid artery by NASCET criteria, approximate 13 mm segment of stenosis beginning at the origin. Normal appearance of the mid to distal included internal carotid arteries.  Reflux of contrast limits evaluation the vertebral artery origins. Left vertebral artery is dominant. Suspected mid to high-grade stenosis the origin of LEFT vertebral artery with mild poststenotic dilatation.  No dissection, no pseudoaneurysm. No abnormal luminal irregularity. No contrast extravasation.  Soft tissues are unremarkable. No acute osseous process though bone windows have not been submitted.  CTA HEAD  Anterior circulation: Patent cervical internal carotid arteries, petrous, cavernous and supra clinoid internal carotid arteries. Intimal thickening results in at least mid grade stenosis anterior genu of the RIGHT carotid siphon. Widely patent anterior communicating artery. Congenitally dominant LEFT A1 segment. Suspected occlusion of RIGHT A1 segment. High-grade stenosis of proximal RIGHT M2 branch. Moderate luminal irregularity of the anterior and middle cerebral arteries.  Posterior circulation: LEFT vertebral artery is dominant. Mid grade stenosis of RIGHT vertebral artery which predominately in terminates in the RIGHT posterior inferior cerebellar artery, with occluded distal thready RIGHT vertebral artery. Mild stenosis LEFT vertebral artery. Widely patent basilar artery. High-grade stenosis suspected of RIGHT superior cerebellar artery origin with moderate luminal irregularity of the RIGHT  greater than LEFT superior cerebellar arterys. RIGHT vertebral artery terminates in the posterior inferior cerebellar artery. Patent posterior cerebral arteries with multifocal mid high-grade stenosis of the LEFT P2 segment. High-grade stenosis of RIGHT P1 segment with thready RIGHT posterior communicating artery present, moderate luminal regularity of the mid to distal RIGHT posterior cerebral artery.  No large vessel occlusion, dissection, luminal irregularity, contrast extravasation or aneurysm within the anterior nor posterior circulation.  IMPRESSION: CT HEAD: Evolving RIGHT superior cerebellar artery territory infarct.  Remote RIGHT basal ganglion and bilateral thalamus lacunar infarcts. Mild white matter changes suggest chronic small vessel ischemic disease.  CTA NECK: Approximately 60% stenosis RIGHT internal carotid artery by NASCET criteria. No large vessel occlusion.  CTA HEAD: Mid grade stenosis of RIGHT anterior genu of the carotid siphon, suspected occlusion of RIGHT A1 segment with patent A-comm artery. High-grade stenosis of proximal RIGHT M2 branch.  Mid grade stenosis of RIGHT vertebral artery, which predominately at terminates in the RIGHT pack of, and included distal thready RIGHT vertebral artery. Suspected high-grade stenosis of the RIGHT superior cerebellar artery origin. Multifocal high-grade stenosis of LEFT P2 segment. High-grade stenosis of RIGHT P1 segment with RIGHT posterior communicating artery present.  Moderate luminal regularity of the cerebral arteries most consistent with atherosclerosis.   Electronically Signed   By: Elon Alas   On: 04/19/2014 04:03   [name] Contrast  04/16/2014   CLINICAL DATA:  69 year old diabetic male with prior TIA now presenting with slurred speech and right-sided weakness for 2 weeks. History sarcoma. Initial encounter.  EXAM: MRI HEAD WITHOUT CONTRAST  TECHNIQUE: Multiplanar, multiecho pulse sequences of the brain and surrounding structures  were obtained without intravenous contrast.  COMPARISON:  04/16/2014 head CT.  08/07/2010 brain MR.  FINDINGS: Exam is motion degraded.  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  Tiny acute nonhemorrhagic posterior right operculum infarct.  Subacute to remote partially hemorrhagic infarct right lenticular nucleus/mid right coronal radiata.  Remote bilateral thalamic infarcts.  Remote infarct anterior left external capsule.  Partially hemorrhagic remote left caudate/anterior limb left duct internal capsule anterior left lenticular nucleus infarct.  Moderate small vessel disease type changes.  Global atrophy without hydrocephalus.  No intracranial mass lesion noted on this unenhanced exam.  Small right vertebral artery may end predominantly in a posterior inferior cerebellar artery distribution. Left vertebral artery, basilar artery and internal carotid arteries appear patent.  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  Tiny acute nonhemorrhagic posterior right operculum infarct.  Subacute to remote partially hemorrhagic infarct right lenticular nucleus/mid right coronal radiata.  Remote bilateral thalamic infarcts.  Remote infarct anterior left external capsule.  Partially hemorrhagic remote left caudate/anterior limb left duct internal capsule anterior left lenticular nucleus infarct.  Moderate small vessel disease type changes.  Small right vertebral artery may end predominantly in a posterior inferior cerebellar artery distribution. Left vertebral artery, basilar artery and internal carotid arteries appear patent.   Electronically Signed   By: Genia Del M.D.   On: 04/16/2014 19:00   US Renal  04/20/2014   CLINICAL DATA:  Acute renal insufficiency.  EXAM: RENAL/URINARY TRACT ULTRASOUND COMPLETE   COMPARISON:  None.  FINDINGS: Right Kidney:  Length: 12.3 cm. Echogenicity within normal limits. No mass or hydronephrosis visualized.  Left Kidney:  Length: 11.8 cm. Echogenicity within normal limits. No mass or hydronephrosis visualized.  Bladder:  Appears normal for degree of bladder distention.  IMPRESSION: Negative exam.   Electronically Signed   By: Inge Rise M.D.   On: 04/20/2014 18:56   Dg Chest Port 1 View  04/20/2014   CLINICAL DATA:  Acute renal injury  EXAM: PORTABLE CHEST - 1 VIEW  COMPARISON:  11/19/2005  FINDINGS: Cardiac shadow is within normal limits. An external monitoring device is noted over the left chest. The overall inspiratory effort is poor with some left basilar atelectasis. No focal confluent infiltrate is seen. No bony abnormality is noted.  IMPRESSION: Left basilar atelectasis.   Electronically Signed   By: Inez Catalina M.D.   On: 04/20/2014 13:20   Dg Swallowing Func-speech Pathology  04/28/2014    Objective Swallowing Evaluation:    Patient Details  Name: Hayden Mckinney MRN: [id number] Date of Birth: [birth year]  Today's Date: 04/28/2014 Time: SLP Start Time (ACUTE ONLY): 0900-SLP Stop Time (ACUTE ONLY): 0930 SLP Time Calculation (min) (ACUTE ONLY): 30 min  Past Medical History:  Past Medical History  Diagnosis Date  . Type II diabetes mellitus   . Sarcoma     a. R leg  . Diabetic peripheral neuropathy     a. both feet  . TIA (transient ischemic attack)     a. 08/2010.  Marland Kitchen Hyperlipidemia   . Hypertension   . Stroke     a. 04/2014 multiple bateral infarcts, presumed to be embolic;  b. [id number]  Carotid U/S: 1-39% bilat ICA stenosis.  . H/O echocardiogram     a. 04/2014 Echo: EF 55-60%, no rwma.  Marland Kitchen ETOH abuse     a. 04/2014 18-24 beers q weekend.   Past Surgical History:  Past Surgical History  Procedure Laterality Date  . Sarcoma removal    . Repair of r hand after trauma    . Loop recorder implant N/A 04/20/2014    Procedure: LOOP RECORDER IMPLANT;  Surgeon: Thompson Grayer, MD;   Location:  [facility] CATH LAB;  Service: Cardiovascular;  Laterality: N/A;  . Tee without cardioversion N/A 04/20/2014    Procedure: TRANSESOPHAGEAL ECHOCARDIOGRAM (TEE);  Surgeon: Larey Dresser, MD;  Location: [facility] ENDOSCOPY;  Service: Cardiovascular;   Laterality: N/A;   HPI:  HPI: 69 y.o. male, with past medical history significant for hypertension  and diabetes and TIAs in the past presenting with 2 weeks history of  increasing weakness, falls and slurred speech that was noted today by his  sister who was visiting him. MRI of the brain shows moderate size acute  subacute nonhemorrhagic infarct mid to superior right cerebellar with  local mass effect as well as tiny acute nonhemorrhagic posterior right  operculum infarct, subacute to remote partially hemorrhagic infarct right  lenticular nucleus mid right coronal radiata and remote bilateral thalamic  and anterior left external capsule infarct. Patient had MBS on 4/13 and  recommended Dys. 3 textures with nectar-thick liquids due to silent  aspiration of thin liquids. Patient transferred to CIR on 4/15 and has  been participating in dysphagia treatment. Repeat MBS today to assess for  possible upgrade.   No Data Recorded  Assessment / Plan / Recommendation CHL IP CLINICAL IMPRESSIONS 04/28/2014  Dysphagia Diagnosis Mild oral phase dysphagia;Mild pharyngeal phase  dysphagia  Clinical impression Pt has a mild oropharyngeal dysphagia with decreased  lingual manipulation and trace base of tongue residue with piecemeal  swallowing with Dys. 1 and Dys. 3 textures. Patient also has prolonged  mastication due to being edentulous. Patient has a delayed swallow  initiation resulting in frequent flash penetration of thin liquids.  Patient also demonstrates trace vallecular residue that cleared with a  cued second swallow.  Although patient demonstrates flash penetration with  thin liquids, patient has been consuming trials of thin liquids with SLP  and independently from his sink  (per patient report) and appears to be  tolerating it without any adverse effects at this time, therefore,   recommend patient upgrade to thin liquids and continue full supervision to  maximize overall swallowing safety.       CHL IP TREATMENT RECOMMENDATION 04/28/2014  Treatment Plan Recommendations Therapy as outlined in treatment plan below      CHL IP DIET RECOMMENDATION 04/28/2014  Diet Recommendations Dysphagia 3 (Mechanical Soft);Thin liquid  Liquid Administration via Cup;Straw  Medication Administration Whole meds with puree  Compensations Small sips/bites;Check for pocketing;Slow rate  Postural Changes and/or Swallow Maneuvers Seated upright 90  degrees;Upright 30-60 min after meal     CHL IP OTHER RECOMMENDATIONS 04/28/2014  Recommended Consults (None)  Oral Care Recommendations Oral care BID  Other Recommendations (None)     CHL IP FOLLOW UP RECOMMENDATIONS 04/28/2014  Follow up Recommendations 24 hour supervision/assistance;Skilled Nursing  facility     CHL IP FREQUENCY AND DURATION 04/21/2014  Speech Therapy Frequency (ACUTE ONLY) min 2x/week  Treatment Duration 2 weeks     Pertinent Vitals/Pain N/A    SLP Swallow Goals No flowsheet data found.  No flowsheet data found.    CHL IP REASON FOR REFERRAL 04/28/2014  Reason for Referral Objectively evaluate swallowing function     CHL IP ORAL PHASE 04/28/2014  Lips (None)  Tongue (None)  Mucous membranes (None)  Nutritional status (None)  Other (  None)  Oxygen therapy (None)  Oral Phase Impaired  Oral - Pudding Teaspoon (None)  Oral - Pudding Cup (None)  Oral - Honey Teaspoon (None)  Oral - Honey Cup (None)  Oral - Honey Syringe (None)  Oral - Nectar Teaspoon (None) Oral - Nectar Cup Weak lingual  manipulation;Lingual/palatal residue Oral - Nectar Straw NT  Oral - Nectar Syringe (None)  Oral - Ice Chips (None)  Oral - Thin Teaspoon (None)  Oral - Thin Cup Weak lingual manipulation;Lingual/palatal residue  Oral - Thin Straw Weak lingual manipulation;Lingual/palatal  residue  Oral - Thin Syringe (None)  Oral - Puree Weak lingual manipulation;Lingual/palatal residue;Piecemeal  swallowing  Oral - Mechanical Soft Impaired mastication;Weak lingual  manipulation;Lingual/palatal residue;Piecemeal swallowing  Oral - Regular (None)  Oral - Multi-consistency (None)  Oral - Pill NT  Oral Phase - Comment (None)      CHL IP PHARYNGEAL PHASE 04/28/2014  Pharyngeal Phase Impaired  Pharyngeal - Pudding Teaspoon (None)  Penetration/Aspiration details (pudding teaspoon) (None)  Pharyngeal - Pudding Cup (None)  Penetration/Aspiration details (pudding cup) (None)  Pharyngeal - Honey Teaspoon (None)  Penetration/Aspiration details (honey teaspoon) (None)  Pharyngeal - Honey Cup (None)  Penetration/Aspiration details (honey cup) (None)  Pharyngeal - Honey Syringe (None)  Penetration/Aspiration details (honey syringe) (None)  Pharyngeal - Nectar Teaspoon (None)  Penetration/Aspiration details (nectar teaspoon) (None)  Pharyngeal - Nectar Cup Delayed swallow initiation;Pharyngeal residue -  valleculae;Compensatory strategies attempted (Comment)  Penetration/Aspiration details (nectar cup) (None)  Pharyngeal - Nectar Straw NT  Penetration/Aspiration details (nectar straw) (None)  Pharyngeal - Nectar Syringe (None)  Penetration/Aspiration details (nectar syringe) (None)  Pharyngeal - Ice Chips (None)  Penetration/Aspiration details (ice chips) (None)  Pharyngeal - Thin Teaspoon (None)  Penetration/Aspiration details (thin teaspoon) (None)  Pharyngeal - Thin Cup Delayed swallow initiation;Pharyngeal residue -  valleculae  Penetration/Aspiration details (thin cup) Material enters airway, remains  ABOVE vocal cords then ejected out  Pharyngeal - Thin Straw Delayed swallow initiation;Pharyngeal residue -  valleculae  Penetration/Aspiration details (thin straw) Material enters airway,  remains ABOVE vocal cords then ejected out  Pharyngeal - Thin Syringe (None)  Penetration/Aspiration details (thin syringe')  (None)  Pharyngeal - Puree Delayed swallow initiation  Penetration/Aspiration details (puree) (None)  Pharyngeal - Mechanical Soft Delayed swallow initiation  Penetration/Aspiration details (mechanical soft) (None)  Pharyngeal - Regular (None)  Penetration/Aspiration details (regular) (None)  Pharyngeal - Multi-consistency (None)  Penetration/Aspiration details (multi-consistency) (None)  Pharyngeal - Pill NT  Penetration/Aspiration details (pill) (None)  Pharyngeal Comment (None)     CHL IP CERVICAL ESOPHAGEAL PHASE 04/28/2014  Cervical Esophageal Phase WFL  Pudding Teaspoon (None)  Pudding Cup (None)  Honey Teaspoon (None)  Honey Cup (None)  Honey Syringe (None)  Nectar Teaspoon (None)  Nectar Cup (None)  Nectar Straw (None)  Nectar Syringe (None)  Thin Teaspoon (None)  Thin Cup (None)  Thin Straw (None)  Thin Syringe (None)  Cervical Esophageal Comment (None)    No flowsheet data found.         Hayden Mckinney, Hayden Mckinney 04/28/2014, 10:21 AM    Dg Swallowing Func-speech Pathology  04/21/2014    Objective Swallowing Evaluation:    Patient Details  Name: Hayden Mckinney MRN: [id number] Date of Birth: Jul 11, 1945  Today's Date: 04/21/2014 Time: SLP Start Time (ACUTE ONLY): 1210-SLP Stop Time (ACUTE ONLY): 1225 SLP Time Calculation (min) (ACUTE ONLY): 15 min  Past Medical History:  Past Medical History  Diagnosis Date  . Type II diabetes mellitus   . Sarcoma  a. R leg  . Diabetic peripheral neuropathy     a. both feet  . TIA (transient ischemic attack)     a. 08/2010.  Marland Kitchen Hyperlipidemia   . Hypertension   . Stroke     a. 04/2014 multiple bateral infarcts, presumed to be embolic;  b. [id number]  Carotid U/S: 1-39% bilat ICA stenosis.  . H/O echocardiogram     a. 04/2014 Echo: EF 55-60%, no rwma.  Marland Kitchen ETOH abuse     a. 04/2014 18-24 beers q weekend.   Past Surgical History:  Past Surgical History  Procedure Laterality Date  . Sarcoma removal    . Repair of r hand after trauma    . Loop recorder implant N/A 04/20/2014    Procedure: LOOP  RECORDER IMPLANT;  Surgeon: Thompson Grayer, MD;  Location:  [facility] CATH LAB;  Service: Cardiovascular;  Laterality: N/A;  . Tee without cardioversion N/A 04/20/2014    Procedure: TRANSESOPHAGEAL ECHOCARDIOGRAM (TEE);  Surgeon: Larey Dresser, MD;  Location: [facility] ENDOSCOPY;  Service: Cardiovascular;   Laterality: N/A;   HPI:  HPI: 69 y.o. male, with past medical history significant for hypertension  and diabetes and TIAs in the past presenting with 2 weeks history of  increasing weakness, falls and slurred speech that was noted today by his  sister who was visiting him. It was difficult to get history from the  patient due to his speech. Patient denies any history of chest pains  shortness of breath nausea vomiting or headaches. Patient lives with a  roommate at home.  No Data Recorded  Assessment / Plan / Recommendation CHL IP CLINICAL IMPRESSIONS 04/21/2014  Dysphagia Diagnosis Mild pharyngeal phase dysphagia;Mild oral phase  dysphagia;Moderate oral phase dysphagia   Clinical impression Pt has a mild-moderate oropharyngeal dysphagia with  decreased bolus containment and cohesion secondary to lingual weakness and  decreased sensation per pt report. Oral phase overall is not significantly  prolonged with small bites, considering that pt is edentulous. Pt has a  delayed pharyngeal trigger with liquids, with adequate containment in the  valleculae before the swallow with nectar thick liquids. He has some  spillage to the pyriform sinsues with thin liquids, with frequent flash  penetration and ultimately sensed aspiration. Given the above as well as  concern for new aspiration PNA, recommend Dys 3 diet and nectar thick  liquids with continued SLP f/u to determine readiness to advance as oral  control improves.      CHL IP TREATMENT RECOMMENDATION 04/21/2014  Treatment Plan Recommendations Therapy as outlined in treatment plan below      CHL IP DIET RECOMMENDATION 04/21/2014  Diet Recommendations Dysphagia 3 (Mechanical  Soft);Nectar-thick liquid  Liquid Administration via Cup;Straw  Medication Administration Whole meds with puree  Compensations Slow rate;Small sips/bites  Postural Changes and/or Swallow Maneuvers Seated upright 90  degrees;Upright 30-60 min after meal     CHL IP OTHER RECOMMENDATIONS 04/21/2014  Recommended Consults (None)  Oral Care Recommendations Oral care BID  Other Recommendations Order thickener from pharmacy;Prohibited food  (jello, ice cream, thin soups);Remove water pitcher     CHL IP FOLLOW UP RECOMMENDATIONS 04/21/2014  Follow up Recommendations Inpatient Rehab     CHL IP FREQUENCY AND DURATION 04/21/2014  Speech Therapy Frequency (ACUTE ONLY) min 2x/week  Treatment Duration 2 weeks     Pertinent Vitals/Pain: n/a     SLP Swallow Goals  CHL IP REASON FOR REFERRAL 04/21/2014  Reason for Referral Objectively evaluate swallowing function  CHL IP ORAL PHASE 04/21/2014  Lips (None)  Tongue (None)  Mucous membranes (None)  Nutritional status (None)  Other (None)  Oxygen therapy (None)  Oral Phase Impaired  Oral - Pudding Teaspoon (None)  Oral - Pudding Cup (None)  Oral - Honey Teaspoon (None)  Oral - Honey Cup (None)  Oral - Honey Syringe (None)  Oral - Nectar Teaspoon (None)  Oral - Nectar Cup (None)  Oral - Nectar Straw Weak lingual manipulation;Lingual/palatal residue  Oral - Nectar Syringe (None)  Oral - Ice Chips (None)  Oral - Thin Teaspoon (None)  Oral - Thin Cup Weak lingual manipulation  Oral - Thin Straw Weak lingual manipulation  Oral - Thin Syringe (None)  Oral - Puree Weak lingual manipulation;Lingual/palatal residue  Oral - Mechanical Soft Weak lingual manipulation;Lingual/palatal residue  Oral - Regular (None)  Oral - Multi-consistency (None)  Oral - Pill Weak lingual manipulation  Oral Phase - Comment (None)      CHL IP PHARYNGEAL PHASE 04/21/2014  Pharyngeal Phase Impaired  Pharyngeal - Pudding Teaspoon (None)  Penetration/Aspiration details (pudding teaspoon) (None)  Pharyngeal - Pudding Cup  (None)  Penetration/Aspiration details (pudding cup) (None)  Pharyngeal - Honey Teaspoon (None)  Penetration/Aspiration details (honey teaspoon) (None)  Pharyngeal - Honey Cup (None)  Penetration/Aspiration details (honey cup) (None)  Pharyngeal - Honey Syringe (None)  Penetration/Aspiration details (honey syringe) (None)  Pharyngeal - Nectar Teaspoon (None)  Penetration/Aspiration details (nectar teaspoon) (None)  Pharyngeal - Nectar Cup (None)  Penetration/Aspiration details (nectar cup) (None)  Pharyngeal - Nectar Straw Delayed swallow initiation  Penetration/Aspiration details (nectar straw) (None)  Pharyngeal - Nectar Syringe (None)  Penetration/Aspiration details (nectar syringe) (None)  Pharyngeal - Ice Chips (None)  Penetration/Aspiration details (ice chips) (None)  Pharyngeal - Thin Teaspoon (None)  Penetration/Aspiration details (thin teaspoon) (None)  Pharyngeal - Thin Cup Delayed swallow initiation;Penetration/Aspiration  during swallow;Premature spillage to pyriform sinuses  Penetration/Aspiration details (thin cup) Material enters airway, remains  ABOVE vocal cords then ejected out  Pharyngeal - Thin Straw Delayed swallow initiation;Premature spillage to  pyriform sinuses;Penetration/Aspiration before swallow  Penetration/Aspiration details (thin straw) Material enters airway, passes  BELOW cords and not ejected out despite cough attempt by patient  Pharyngeal - Thin Syringe (None)  Penetration/Aspiration details (thin syringe') (None)  Pharyngeal - Puree WFL  Penetration/Aspiration details (puree) (None)  Pharyngeal - Mechanical Soft WFL  Penetration/Aspiration details (mechanical soft) (None)  Pharyngeal - Regular (None)  Penetration/Aspiration details (regular) (None)  Pharyngeal - Multi-consistency (None)  Penetration/Aspiration details (multi-consistency) (None)  Pharyngeal - Pill WFL  Penetration/Aspiration details (pill) (None)  Pharyngeal Comment (None)     CHL IP CERVICAL ESOPHAGEAL PHASE  04/21/2014  Cervical Esophageal Phase WFL  Pudding Teaspoon (None)  Pudding Cup (None)  Honey Teaspoon (None)  Honey Cup (None)  Honey Syringe (None)  Nectar Teaspoon (None)  Nectar Cup (None)  Nectar Straw (None)  Nectar Syringe (None)  Thin Teaspoon (None)  Thin Cup (None)  Thin Straw (None)  Thin Syringe (None)  Cervical Esophageal Comment (None)         [name], M.A. CCC-SLP [phone]  [name] 04/21/2014, 2:01 PM     ASSESSMENT/PLAN:  Cerebellar infarct - follow-up with Dr. Erlinda Hong, in 1 month; for rehabilitation; continue aspirin 81 mg by mouth daily 3 months then discontinue; continue Plavix 75 mg  by mouth daily Neuropathy - continue Lyrica 50 mg by mouth twice a day Urinary retention - continue Flomax 0.4 mg by mouth daily Hypertension - continue Coreg 6.25  mg by mouth twice a day and BiDil 20-30 7.5 mg by mouth 3 times a day Hyperlipidemia - continue Lipitor 40 mg by mouth daily Diabetes mellitus with neuropathy - hgbA1c 9.0; continue Amaryl 1 mg by mouth daily; Glucophage was held due to creatinine of 1.53 Acute on chronic renal insufficiency - creatinine 1.53; will monitor   Goals of care:  Short-term rehabilitation  Labs/test ordered:  Cbc, cmp  Spent 50 minutes in patient care.   [facility], NP [employer] [phone]

## 2014-05-05 NOTE — Telephone Encounter (Signed)
Pt was on tcm list d/c 05/04/14 dx CVA will f/u with Dr. Alger Simons did not make tcm appt with pcp...Johny Chess

## 2014-05-07 ENCOUNTER — Non-Acute Institutional Stay (SKILLED_NURSING_FACILITY): Payer: Medicare Other | Admitting: Internal Medicine

## 2014-05-07 DIAGNOSIS — E1140 Type 2 diabetes mellitus with diabetic neuropathy, unspecified: Secondary | ICD-10-CM

## 2014-05-07 DIAGNOSIS — E1149 Type 2 diabetes mellitus with other diabetic neurological complication: Secondary | ICD-10-CM

## 2014-05-07 DIAGNOSIS — I69391 Dysphagia following cerebral infarction: Secondary | ICD-10-CM | POA: Diagnosis not present

## 2014-05-07 DIAGNOSIS — I69922 Dysarthria following unspecified cerebrovascular disease: Secondary | ICD-10-CM

## 2014-05-07 DIAGNOSIS — I6340 Cerebral infarction due to embolism of unspecified cerebral artery: Secondary | ICD-10-CM | POA: Diagnosis not present

## 2014-05-07 DIAGNOSIS — I69322 Dysarthria following cerebral infarction: Secondary | ICD-10-CM

## 2014-05-07 DIAGNOSIS — I10 Essential (primary) hypertension: Secondary | ICD-10-CM | POA: Diagnosis not present

## 2014-05-07 DIAGNOSIS — R339 Retention of urine, unspecified: Secondary | ICD-10-CM

## 2014-05-07 DIAGNOSIS — N179 Acute kidney failure, unspecified: Secondary | ICD-10-CM

## 2014-05-07 DIAGNOSIS — I639 Cerebral infarction, unspecified: Secondary | ICD-10-CM

## 2014-05-07 DIAGNOSIS — G629 Polyneuropathy, unspecified: Secondary | ICD-10-CM

## 2014-05-07 NOTE — Progress Notes (Signed)
Patient ID: Hayden Mckinney, male   DOB: [birth year], 69 y.o.   MRN: [id number]     [facility]   PCP: No primary care provider on file.  Code Status: full code  No Known Allergies  Chief Complaint  Patient presents with  . New Admit To SNF     HPI:  69 year old patient is here for short term rehabilitation post hospital admission from 04/16/14-04/21/14 and inpatient rehab from 04/21/14-05/04/14 with right sided weakness and slurred speech x 2 weeks. He was diagnosed with acute CVA- cerebellar subcortical infarct. Neurology was consulted. He was started on aspirin and plavix with statin. He had a loop recorder placed. He had dysphagia and was placed on dysphagia diet. He was treated for pneumonia. He has PMH of DM, HTN, TIA.  He is seen in his room today. He feels tired post therapy but denies any other concerns. He is currently on mechanical soft diet, tolerating it well. He has some dysarthria but able to communicate. No concerns from the staff.   Review of Systems:  Constitutional: Negative for fever, chills, diaphoresis.  HENT: Negative for headache, congestion, nasal discharge Eyes: Negative for eye pain, blurred vision, discharge.  Respiratory: Negative for cough, shortness of breath and wheezing.   Cardiovascular: Negative for chest pain, palpitations, leg swelling.  Gastrointestinal: Negative for heartburn, nausea, vomiting, abdominal pain. Had bowel movement yesterday Genitourinary: Negative for dysuria Musculoskeletal: Negative for back pain, falls Skin: Negative for itching, rash.  Neurological: Negative for dizziness, tingling, focal weakness Psychiatric/Behavioral: Negative for depression   Past Medical History  Diagnosis Date  . Type II diabetes mellitus   . Sarcoma     a. R leg  . Diabetic peripheral neuropathy     a. both feet  . TIA (transient ischemic attack)     a. 08/2010.  Marland Kitchen Hyperlipidemia   . Hypertension   . Stroke     a. 04/2014 multiple bateral infarcts, presumed to be embolic;  b. 01/7406 Carotid U/S: 1-39% bilat ICA stenosis.  . H/O echocardiogram     a. 04/2014 Echo: EF 55-60%, no rwma.  Marland Kitchen ETOH abuse     a. 04/2014 18-24 beers q weekend.   Past Surgical History  Procedure Laterality Date  . Sarcoma removal    . Repair of r hand after trauma    . Loop recorder implant N/A 04/20/2014    Procedure: LOOP RECORDER IMPLANT;  Surgeon: Thompson Grayer, MD;  Location: [facility] CATH LAB;  Service: Cardiovascular;  Laterality: N/A;  . Tee without cardioversion N/A 04/20/2014    Procedure: TRANSESOPHAGEAL ECHOCARDIOGRAM (TEE);  Surgeon: Larey Dresser, MD;  Location: [place];  Service: Cardiovascular;  Laterality: N/A;   Social History:   reports that he has never smoked. He has never used smokeless tobacco. He reports that he drinks about 3.6 oz of alcohol per week. He reports that he does not use illicit drugs.  Family History  Problem Relation Age of Onset  . Cancer Mother   . Cancer Father   . Diabetes Brother     Medications: Patient's Medications  New Prescriptions   No medications on file  Previous Medications   AMOXICILLIN-CLAVULANATE (AUGMENTIN) 875-125 MG PER TABLET    Take 1 tablet by mouth 2 (two) times daily. 6 more days   ASPIRIN EC 81 MG EC TABLET    Take 1 tablet (81 mg total) by mouth daily.   ATORVASTATIN (LIPITOR) 40 MG TABLET  Take 1 tablet (40 mg total) by mouth daily at 6 PM.   BLOOD GLUCOSE MONITORING SUPPL (RELION CONFIRM GLUCOSE MONITOR) W/DEVICE KIT    Use as instructed.   CARVEDILOL (COREG) 6.25 MG TABLET    Take 1 tablet (6.25 mg total) by mouth 2 (two) times daily with a meal.   CLOPIDOGREL (PLAVIX) 75 MG TABLET    Take 1 tablet (75 mg total) by mouth daily.   INSULIN ASPART (NOVOLOG) 100 UNIT/ML INJECTION    Before each meal 3 times a day, 140-199 - 2 units, 200-250 - 4 units, 251-299 - 6 units,  300-349 - 8 units,  350 or above 10 units.   ISOSORBIDE-HYDRALAZINE (BIDIL)  20-37.5 MG PER TABLET    Take 1 tablet by mouth 3 (three) times daily.   PREGABALIN (LYRICA) 50 MG CAPSULE    Take 1 capsule (50 mg total) by mouth 2 (two) times daily.  Modified Medications   No medications on file  Discontinued Medications   No medications on file     Physical Exam: Filed Vitals:   05/07/14 1451  BP: 133/69  Pulse: 88  Temp: 98.6 F (37 C)  Resp: 18  SpO2: 99%    General- elderly male, in no acute distress Head- normocephalic, atraumatic Throat- moist mucus membrane Eyes- PERRLA, EOMI, no pallor, no icterus, no discharge Neck- no cervical lymphadenopathy Cardiovascular- normal s1,s2, no murmurs, palpable dorsalis pedis, no leg edema Respiratory- bilateral clear to auscultation, no wheeze, no rhonchi, no crackles, no use of accessory muscles Abdomen- bowel sounds present, soft, non tender Musculoskeletal- able to move all 4 extremities, generalized weakness  Neurological- dysarthria Skin- warm and dry Psychiatry- alert and oriented to person, place and time, normal mood and affect    Labs reviewed: Basic Metabolic Panel:  Recent Labs  04/19/14 0658 04/20/14 0714 04/21/14 0434 04/21/14 1715  NA 137 141 143  --   K 3.7 3.6 3.7  --   CL 109 110 115*  --   CO2 '25 27 22  ' --   GLUCOSE 167* 179* 148*  --   BUN 24* 26* 25*  --   CREATININE 1.36* 1.55* 1.54* 1.53*  CALCIUM 8.4 8.8 8.3*  --    Liver Function Tests: No results for input(s): AST, ALT, ALKPHOS, BILITOT, PROT, ALBUMIN in the last 8760 hours. No results for input(s): LIPASE, AMYLASE in the last 8760 hours. No results for input(s): AMMONIA in the last 8760 hours. CBC:  Recent Labs  04/20/14 1340 04/21/14 0434 04/21/14 1715 04/22/14 0639  WBC 5.9 5.7 6.0 5.4  NEUTROABS 3.5  --   --   --   HGB 12.0* 10.6* 11.1* 10.2*  HCT 36.4* 31.9* 33.8* 31.2*  MCV 84.5 84.4 85.1 86.7  PLT 232 212 231 189   Cardiac Enzymes: No results for input(s): CKTOTAL, CKMB, CKMBINDEX, TROPONINI in the  last 8760 hours. BNP: Invalid input(s): POCBNP CBG:  Recent Labs  05/03/14 2133 05/04/14 0653 05/04/14 1148  GLUCAP 135* 106* 118*    Radiological Exams: TTE  Left ventricle: The cavity size was normal. Systolic function was   normal. The estimated ejection fraction was in the range of 55%   to 60%. Wall motion was normal; there were no regional wall   motion abnormalities.   TEE Normal LV size and systolic function, EF 26-20%.  No regional wall motion abnormalities.  Normal RV size and systolic function.  Trivial MR.  Trileaflet aortic valve with no AI or AS.  Normal  atrial sizes.  No LAA thrombus.  Negative bubble study, no evidence for PFO or ASD.  Normal aorta size with mild plaque descending thoracic aorta.   Carotid doppler Bilateral: 1-39% ICA stenosis. Vertebral artery flow is antegrade  Assessment/Plan  Acute CVA Continue aspirin and plavix for now, discontinue aspirin in 3 months, has f/u with neurology. Will have him work with physical therapy and occupational therapy team to help with gait training and muscle strengthening exercises.fall precautions. Skin care. Encourage to be out of bed. Continue lipitor 40 mg daily. Has loop recorder in place, has f/u with cardiology  Dysphagia post cva Continue mechanical soft diet for now with aspiration precautions, to be followed by SLP team  Dysarthria Post cva, working with SLP team  Acute renal failure Acute on chronic renal insufficiency. Monitor renal function, if improves, resume glucophage  Hypertension Stable, continue Coreg 6.25 mg bid and bidil tid. Monitor bp  Diabetes mellitus with neuropathy  a1c 9. continue Amaryl 1 mg daily. Metformin on hold with impaired renal function. Monitor cbg and consider insulin if needed  Neuropathy continue Lyrica 50 mg bid  Urinary retention Improved with Flomax 0.4 mg daily   Goals of care: short term rehabilitation   Labs/tests ordered: cbc, cbc, bmp  Family/  staff Communication: reviewed care plan with patient and nursing supervisor    Blanchie Serve, MD  [facility] [phone] (Monday-Friday 8 am - 5 pm) [phone] (afterhours)

## 2014-05-19 ENCOUNTER — Ambulatory Visit (INDEPENDENT_AMBULATORY_CARE_PROVIDER_SITE_OTHER): Payer: Medicare Other | Admitting: *Deleted

## 2014-05-19 DIAGNOSIS — I639 Cerebral infarction, unspecified: Secondary | ICD-10-CM

## 2014-05-20 ENCOUNTER — Ambulatory Visit (INDEPENDENT_AMBULATORY_CARE_PROVIDER_SITE_OTHER): Payer: Medicare Other | Admitting: *Deleted

## 2014-05-20 DIAGNOSIS — I639 Cerebral infarction, unspecified: Secondary | ICD-10-CM | POA: Diagnosis not present

## 2014-05-20 LAB — CUP PACEART INCLINIC DEVICE CHECK
MDC IDC SESS DTM: 20160511194728
MDC IDC SET ZONE DETECTION INTERVAL: 2000 ms
MDC IDC SET ZONE DETECTION INTERVAL: 3000 ms
Zone Setting Detection Interval: 370 ms

## 2014-05-20 NOTE — Progress Notes (Signed)
Wound check in clinic s/p ILR implant. Wound well healed without redness or edema.  Pt with 0 tachy episodes; 0 brady episodes; 0 asystole; 0 symptom; 0 AF episodes. Plan to follow up via [facility] and with JA prn.

## 2014-05-21 NOTE — Progress Notes (Signed)
Loop recorder 

## 2014-05-27 ENCOUNTER — Encounter: Payer: Self-pay | Admitting: Cardiology

## 2014-06-03 ENCOUNTER — Encounter: Payer: Self-pay | Admitting: Cardiology

## 2014-06-03 ENCOUNTER — Non-Acute Institutional Stay (SKILLED_NURSING_FACILITY): Payer: Medicare Other | Admitting: Adult Health

## 2014-06-03 ENCOUNTER — Encounter: Payer: Self-pay | Admitting: Adult Health

## 2014-06-03 DIAGNOSIS — I639 Cerebral infarction, unspecified: Secondary | ICD-10-CM

## 2014-06-03 DIAGNOSIS — E1149 Type 2 diabetes mellitus with other diabetic neurological complication: Secondary | ICD-10-CM

## 2014-06-03 DIAGNOSIS — E1140 Type 2 diabetes mellitus with diabetic neuropathy, unspecified: Secondary | ICD-10-CM | POA: Diagnosis not present

## 2014-06-03 DIAGNOSIS — G629 Polyneuropathy, unspecified: Secondary | ICD-10-CM | POA: Diagnosis not present

## 2014-06-03 DIAGNOSIS — E785 Hyperlipidemia, unspecified: Secondary | ICD-10-CM | POA: Diagnosis not present

## 2014-06-03 DIAGNOSIS — I10 Essential (primary) hypertension: Secondary | ICD-10-CM | POA: Diagnosis not present

## 2014-06-03 DIAGNOSIS — R339 Retention of urine, unspecified: Secondary | ICD-10-CM

## 2014-06-03 NOTE — Progress Notes (Signed)
Patient ID: Hayden Mckinney, male   DOB: 12-Oct-1945, 69 y.o.   MRN: [id number]   06/03/2014  Facility:  Nursing Home Location:  [place] Room Number: 101-P LEVEL OF CARE:  SNF (31)   Chief Complaint  Patient presents with  . Discharge Note    Cerebral infarct, neuropathy, urinary retention, hypertension, hyperlipidemia, and diabetes mellitus     HISTORY OF PRESENT ILLNESS:  This is a 69 year old male who is for discharge to an ALF and will have Home health PT and OT. He has been admitted to [facility] on 05/04/14 from [facility]. He has PMH of hypertension, TIA, hyperlipidemia and diabetes mellitus. He presented to the hospital on 04/16/14 with right-sided weakness and slurred speech 2 weeks. MRI showed moderate size acute subacute nonhemorrhagic infarct, mid superior right cerebellar with local mass effect as well as tiny nonhemorrhagic infarct, right lenticular nucleus, mid right coronal radiata. Echocardiogram with EF 60%. 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 TEE EF 65%. He had loop recorder implantation on 04/20/14. He had a comprehensive rehab program @ [facility].  Patient was admitted to this facility for short-term rehabilitation after the patient's recent hospitalization.  Patient has completed SNF rehabilitation and therapy has cleared the patient for discharge.  PAST MEDICAL HISTORY:  Past Medical History  Diagnosis Date  . Type II diabetes mellitus   . Sarcoma     a. R leg  . Diabetic peripheral neuropathy     a. both feet  . TIA (transient ischemic attack)     a. 08/2010.  Marland Kitchen Hyperlipidemia   . Hypertension   . Stroke     a. 04/2014 multiple bateral infarcts, presumed to be embolic;  b. [id number] Carotid U/S: 1-39% bilat ICA stenosis.  . H/O echocardiogram     a. 04/2014 Echo: EF 55-60%, no rwma.  Marland Kitchen ETOH abuse     a. 04/2014 18-24 beers q weekend.    CURRENT MEDICATIONS: Reviewed per MAR/see  medication list  No Known Allergies   REVIEW OF SYSTEMS:  GENERAL: no change in appetite, no fatigue, no weight changes, no fever, chills or weakness RESPIRATORY: no cough, SOB, DOE, wheezing, hemoptysis CARDIAC: no chest pain, edema or palpitations GI: no abdominal pain, diarrhea, constipation, heart burn, nausea or vomiting  PHYSICAL EXAMINATION  GENERAL: no acute distress, normal body habitus NECK: supple, trachea midline, no neck masses, no thyroid tenderness, no thyromegaly LYMPHATICS: no LAN in the neck, no supraclavicular LAN RESPIRATORY: breathing is even & unlabored, BS CTAB CARDIAC: RRR, no murmur,no extra heart sounds, no edema GI: abdomen soft, normal BS, no masses, no tenderness, no hepatomegaly, no splenomegaly EXTREMITIES:  RUE and RLE 4/5 strength; has RLE splint NEURO:  Dysarthric speech but intelligible, weakness on RUE and RLE PSYCHIATRIC: the patient is alert & oriented to person, affect & behavior appropriate  LABS/RADIOLOGY: Labs reviewed: 05/06/14  WBC 5.0 hemoglobin 9.9 hematocrit 29.8 MCV 84.7 sodium 141 potassium 4.2 glucose 90 BUN 14 creatinine 1.20 total bilirubin 0.3 alkaline phosphatase 54 SGOT 21 SGPT 26 total protein 5.1 albumin 3.2 calcium 8.1 Basic Metabolic Panel:  Recent Labs  04/19/14 0658 04/20/14 0714 04/21/14 0434 04/21/14 1715  NA 137 141 143  --   K 3.7 3.6 3.7  --   CL 109 110 115*  --   CO2 25 27 22   --   GLUCOSE 167* 179* 148*  --   BUN 24* 26* 25*  --  CREATININE 1.36* 1.55* 1.54* 1.53*  CALCIUM 8.4 8.8 8.3*  --    CBC:  Recent Labs  04/20/14 1340 04/21/14 0434 04/21/14 1715 04/22/14 0639  WBC 5.9 5.7 6.0 5.4  NEUTROABS 3.5  --   --   --   HGB 12.0* 10.6* 11.1* 10.2*  HCT 36.4* 31.9* 33.8* 31.2*  MCV 84.5 84.4 85.1 86.7  PLT 232 212 231 189   Lipid Panel:  Recent Labs  04/17/14 1543  HDL 37*   CBG:  Recent Labs  05/03/14 2133 05/04/14 0653 05/04/14 1148  GLUCAP 135* 106* 118*    No results  found.  ASSESSMENT/PLAN:  Cerebellar infarct - follow-up with Dr. Erlinda Hong, neurology ; for Home health PT and OT; continue aspirin 81 mg by mouth daily 2 months then discontinue; continue Plavix 75 mg  by mouth daily Neuropathy - continue Lyrica 50 mg by mouth twice a day Urinary retention - continue Flomax 0.4 mg by mouth daily Hypertension - continue Coreg 6.25 mg by mouth twice a day and BiDil 20-30 7.5 mg by mouth 3 times a day Hyperlipidemia - continue Lipitor 40 mg by mouth daily Diabetes mellitus with neuropathy - hgbA1c 9.0; continue Amaryl 1 mg by mouth daily; Glucophage was held due to creatinine of 1.53    I have filled out patient's discharge paperwork and written prescriptions.  Patient will receive home health PT and OT.  Total discharge time: Less than 30 minutes  Discharge time involved coordination of the discharge process with Education officer, museum, nursing staff and therapy department. Medical justification for home health services verified.   [facility], NP [employer] [phone]

## 2014-06-08 ENCOUNTER — Encounter: Payer: Self-pay | Admitting: Internal Medicine

## 2014-06-09 LAB — CUP PACEART REMOTE DEVICE CHECK: MDC IDC SESS DTM: 20160601163815

## 2014-06-11 ENCOUNTER — Encounter: Payer: Self-pay | Admitting: Cardiology

## 2014-06-18 ENCOUNTER — Encounter: Payer: Self-pay | Admitting: Cardiology

## 2014-06-21 ENCOUNTER — Encounter: Payer: Self-pay | Admitting: Internal Medicine

## 2014-06-23 ENCOUNTER — Encounter: Payer: Self-pay | Admitting: Cardiology

## 2014-07-02 ENCOUNTER — Encounter: Payer: Medicare Other | Attending: Physical Medicine & Rehabilitation | Admitting: Physical Medicine & Rehabilitation

## 2014-07-06 ENCOUNTER — Inpatient Hospital Stay (HOSPITAL_COMMUNITY)
Admission: EM | Admit: 2014-07-06 | Discharge: 2014-07-10 | DRG: 065 | Disposition: A | Discharge status: Skilled Nursing Facility | Payer: Medicare Other | Attending: Internal Medicine | Admitting: Internal Medicine

## 2014-07-06 ENCOUNTER — Other Ambulatory Visit: Payer: Self-pay | Admitting: Internal Medicine

## 2014-07-06 ENCOUNTER — Observation Stay (HOSPITAL_COMMUNITY): Payer: Medicare Other

## 2014-07-06 ENCOUNTER — Emergency Department (HOSPITAL_COMMUNITY): Payer: Medicare Other

## 2014-07-06 ENCOUNTER — Encounter (HOSPITAL_COMMUNITY): Payer: Self-pay | Admitting: Vascular Surgery

## 2014-07-06 DIAGNOSIS — E785 Hyperlipidemia, unspecified: Secondary | ICD-10-CM | POA: Diagnosis not present

## 2014-07-06 DIAGNOSIS — I10 Essential (primary) hypertension: Secondary | ICD-10-CM | POA: Diagnosis present

## 2014-07-06 DIAGNOSIS — N390 Urinary tract infection, site not specified: Secondary | ICD-10-CM | POA: Diagnosis present

## 2014-07-06 DIAGNOSIS — B9620 Unspecified Escherichia coli [E. coli] as the cause of diseases classified elsewhere: Secondary | ICD-10-CM | POA: Diagnosis present

## 2014-07-06 DIAGNOSIS — R29898 Other symptoms and signs involving the musculoskeletal system: Secondary | ICD-10-CM | POA: Diagnosis not present

## 2014-07-06 DIAGNOSIS — I739 Peripheral vascular disease, unspecified: Secondary | ICD-10-CM | POA: Diagnosis present

## 2014-07-06 DIAGNOSIS — R4781 Slurred speech: Secondary | ICD-10-CM

## 2014-07-06 DIAGNOSIS — N179 Acute kidney failure, unspecified: Secondary | ICD-10-CM | POA: Diagnosis present

## 2014-07-06 DIAGNOSIS — R471 Dysarthria and anarthria: Secondary | ICD-10-CM | POA: Diagnosis present

## 2014-07-06 DIAGNOSIS — R109 Unspecified abdominal pain: Secondary | ICD-10-CM | POA: Diagnosis not present

## 2014-07-06 DIAGNOSIS — D649 Anemia, unspecified: Secondary | ICD-10-CM | POA: Diagnosis present

## 2014-07-06 DIAGNOSIS — Z794 Long term (current) use of insulin: Secondary | ICD-10-CM

## 2014-07-06 DIAGNOSIS — Z7982 Long term (current) use of aspirin: Secondary | ICD-10-CM

## 2014-07-06 DIAGNOSIS — R55 Syncope and collapse: Secondary | ICD-10-CM | POA: Diagnosis not present

## 2014-07-06 DIAGNOSIS — E1142 Type 2 diabetes mellitus with diabetic polyneuropathy: Secondary | ICD-10-CM | POA: Diagnosis present

## 2014-07-06 DIAGNOSIS — Z8673 Personal history of transient ischemic attack (TIA), and cerebral infarction without residual deficits: Secondary | ICD-10-CM

## 2014-07-06 DIAGNOSIS — Z79899 Other long term (current) drug therapy: Secondary | ICD-10-CM

## 2014-07-06 DIAGNOSIS — Z7902 Long term (current) use of antithrombotics/antiplatelets: Secondary | ICD-10-CM

## 2014-07-06 DIAGNOSIS — I959 Hypotension, unspecified: Secondary | ICD-10-CM | POA: Diagnosis present

## 2014-07-06 DIAGNOSIS — I6330 Cerebral infarction due to thrombosis of unspecified cerebral artery: Secondary | ICD-10-CM | POA: Insufficient documentation

## 2014-07-06 DIAGNOSIS — R4182 Altered mental status, unspecified: Secondary | ICD-10-CM | POA: Insufficient documentation

## 2014-07-06 DIAGNOSIS — I639 Cerebral infarction, unspecified: Secondary | ICD-10-CM | POA: Diagnosis present

## 2014-07-06 DIAGNOSIS — I6300 Cerebral infarction due to thrombosis of unspecified precerebral artery: Secondary | ICD-10-CM | POA: Diagnosis not present

## 2014-07-06 DIAGNOSIS — K5900 Constipation, unspecified: Secondary | ICD-10-CM | POA: Diagnosis present

## 2014-07-06 DIAGNOSIS — E1149 Type 2 diabetes mellitus with other diabetic neurological complication: Secondary | ICD-10-CM | POA: Diagnosis present

## 2014-07-06 HISTORY — DX: Cerebral infarction, unspecified: I63.9

## 2014-07-06 LAB — GLUCOSE, CAPILLARY
Glucose-Capillary: 114 mg/dL — ABNORMAL HIGH (ref 65–99)
Glucose-Capillary: 83 mg/dL (ref 65–99)
Glucose-Capillary: 203 mg/dL — ABNORMAL HIGH (ref 65–99)

## 2014-07-06 LAB — COMPREHENSIVE METABOLIC PANEL
ALT: 13 U/L — ABNORMAL LOW (ref 17–63)
AST: 12 U/L — ABNORMAL LOW (ref 15–41)
Albumin: 2.6 g/dL — ABNORMAL LOW (ref 3.5–5.0)
Alkaline Phosphatase: 68 U/L (ref 38–126)
Anion gap: 5 (ref 5–15)
BUN: 23 mg/dL — ABNORMAL HIGH (ref 6–20)
CO2: 22 mmol/L (ref 22–32)
Calcium: 8.6 mg/dL — ABNORMAL LOW (ref 8.9–10.3)
Chloride: 111 mmol/L (ref 101–111)
Creatinine, Ser: 1.75 mg/dL — ABNORMAL HIGH (ref 0.61–1.24)
GFR, EST AFRICAN AMERICAN: 44 mL/min — AB (ref >60)
GFR, EST NON AFRICAN AMERICAN: 38 mL/min — AB (ref >60)
GLUCOSE: 161 mg/dL — AB (ref 65–99)
Potassium: 4.9 mmol/L (ref 3.5–5.1)
SODIUM: 138 mmol/L (ref 135–145)
Total Bilirubin: 0.3 mg/dL (ref 0.3–1.2)
Total Protein: 5.8 g/dL — ABNORMAL LOW (ref 6.5–8.1)

## 2014-07-06 LAB — CBC
HEMATOCRIT: 27.5 % — AB (ref 39.0–52.0)
Hemoglobin: 8.9 g/dL — ABNORMAL LOW (ref 13.0–17.0)
MCH: 27.3 pg (ref 26.0–34.0)
MCHC: 32.4 g/dL (ref 30.0–36.0)
MCV: 84.4 fL (ref 78.0–100.0)
PLATELETS: 317 10*3/uL (ref 150–400)
RBC: 3.26 MIL/uL — AB (ref 4.22–5.81)
RDW: 13.3 % (ref 11.5–15.5)
WBC: 6.8 10*3/uL (ref 4.0–10.5)

## 2014-07-06 LAB — I-STAT CHEM 8, ED
BUN: 26 mg/dL — ABNORMAL HIGH (ref 6–20)
CHLORIDE: 108 mmol/L (ref 101–111)
CREATININE: 1.6 mg/dL — AB (ref 0.61–1.24)
Calcium, Ion: 1.24 mmol/L (ref 1.13–1.30)
Glucose, Bld: 158 mg/dL — ABNORMAL HIGH (ref 65–99)
HCT: 27 % — ABNORMAL LOW (ref 39.0–52.0)
Hemoglobin: 9.2 g/dL — ABNORMAL LOW (ref 13.0–17.0)
POTASSIUM: 4.9 mmol/L (ref 3.5–5.1)
Sodium: 138 mmol/L (ref 135–145)
TCO2: 22 mmol/L (ref 0–100)

## 2014-07-06 LAB — I-STAT TROPONIN, ED: Troponin i, poc: 0 ng/mL (ref 0.00–0.08)

## 2014-07-06 LAB — OCCULT BLOOD X 1 CARD TO LAB, STOOL: FECAL OCCULT BLD: NEGATIVE

## 2014-07-06 LAB — CUP PACEART MISC DEVICE CHECK: Date Time Interrogation Session: 20160628131627

## 2014-07-06 LAB — DIFFERENTIAL
BASOS ABS: 0 10*3/uL (ref 0.0–0.1)
BASOS PCT: 0 % (ref 0–1)
EOS ABS: 0.1 10*3/uL (ref 0.0–0.7)
EOS PCT: 1 % (ref 0–5)
Lymphocytes Relative: 29 % (ref 12–46)
Lymphs Abs: 1.9 10*3/uL (ref 0.7–4.0)
Monocytes Absolute: 0.7 10*3/uL (ref 0.1–1.0)
Monocytes Relative: 11 % (ref 3–12)
Neutro Abs: 4 10*3/uL (ref 1.7–7.7)
Neutrophils Relative %: 59 % (ref 43–77)

## 2014-07-06 LAB — CBG MONITORING, ED: Glucose-Capillary: 142 mg/dL — ABNORMAL HIGH (ref 65–99)

## 2014-07-06 LAB — PROTIME-INR
INR: 1.09 (ref 0.00–1.49)
Prothrombin Time: 14.3 seconds (ref 11.6–15.2)

## 2014-07-06 LAB — ETHANOL

## 2014-07-06 LAB — APTT: aPTT: 28 seconds (ref 24–37)

## 2014-07-06 MED ORDER — ASPIRIN EC 325 MG PO TBEC: 325.0000 mg | DELAYED_RELEASE_TABLET | Freq: Every day | ORAL | Status: DC

## 2014-07-06 MED ORDER — ASPIRIN 300 MG RE SUPP
300.0000 mg | Freq: Every day | RECTAL | Status: DC
  Administered 2014-07-06 – 2014-07-07 (×2): 300 mg via RECTAL
  Filled 2014-07-06: qty 1

## 2014-07-06 MED ORDER — ACETAMINOPHEN 325 MG PO TABS: 650.0000 mg | ORAL_TABLET | Freq: Four times a day (QID) | ORAL | Status: DC | PRN

## 2014-07-06 MED ORDER — HEPARIN SODIUM (PORCINE) 5000 UNIT/ML IJ SOLN
5000.0000 [IU] | Freq: Three times a day (TID) | INTRAMUSCULAR | Status: DC
  Administered 2014-07-06 – 2014-07-10 (×13): 5000 [IU] via SUBCUTANEOUS
  Filled 2014-07-06 (×12): qty 1

## 2014-07-06 MED ORDER — ONDANSETRON HCL 4 MG PO TABS: 4.0000 mg | ORAL_TABLET | Freq: Four times a day (QID) | ORAL | Status: DC | PRN

## 2014-07-06 MED ORDER — SODIUM CHLORIDE 0.9 % IV SOLN
INTRAVENOUS | Status: DC
  Administered 2014-07-06: 12:00:00 via INTRAVENOUS

## 2014-07-06 MED ORDER — SENNOSIDES-DOCUSATE SODIUM 8.6-50 MG PO TABS: 1.0000 | ORAL_TABLET | Freq: Every evening | ORAL | Status: DC | PRN

## 2014-07-06 MED ORDER — ACETAMINOPHEN 650 MG RE SUPP: 650.0000 mg | Freq: Four times a day (QID) | RECTAL | Status: DC | PRN

## 2014-07-06 MED ORDER — BISACODYL 10 MG RE SUPP: 10.0000 mg | Freq: Every day | RECTAL | Status: DC | PRN

## 2014-07-06 MED ORDER — DOCUSATE SODIUM 100 MG PO CAPS
100.0000 mg | ORAL_CAPSULE | Freq: Two times a day (BID) | ORAL | Status: DC
  Administered 2014-07-06 – 2014-07-10 (×8): 100 mg via ORAL
  Filled 2014-07-06 (×8): qty 1

## 2014-07-06 MED ORDER — ALUM & MAG HYDROXIDE-SIMETH 200-200-20 MG/5ML PO SUSP: 30.0000 mL | Freq: Four times a day (QID) | ORAL | Status: DC | PRN

## 2014-07-06 MED ORDER — ONDANSETRON HCL 4 MG/2ML IJ SOLN
4.0000 mg | Freq: Four times a day (QID) | INTRAMUSCULAR | Status: DC | PRN
  Filled 2014-07-06: qty 2

## 2014-07-06 MED ORDER — HYDRALAZINE HCL 20 MG/ML IJ SOLN: 10.0000 mg | Freq: Four times a day (QID) | INTRAMUSCULAR | Status: DC | PRN

## 2014-07-06 MED ORDER — FAMOTIDINE 20 MG PO TABS
20.0000 mg | ORAL_TABLET | Freq: Every day | ORAL | Status: DC
  Administered 2014-07-07 – 2014-07-10 (×4): 20 mg via ORAL
  Filled 2014-07-06 (×4): qty 1

## 2014-07-06 MED ORDER — IOHEXOL 350 MG/ML SOLN
80.0000 mL | Freq: Once | INTRAVENOUS | Status: AC | PRN
  Administered 2014-07-06: 80 mL via INTRAVENOUS

## 2014-07-06 MED ORDER — SODIUM CHLORIDE 0.9 % IJ SOLN
3.0000 mL | Freq: Two times a day (BID) | INTRAMUSCULAR | Status: DC
  Administered 2014-07-06 – 2014-07-10 (×7): 3 mL via INTRAVENOUS

## 2014-07-06 MED ORDER — MORPHINE SULFATE 2 MG/ML IJ SOLN: 1.0000 mg | INTRAMUSCULAR | Status: DC | PRN

## 2014-07-06 MED ORDER — CLOPIDOGREL BISULFATE 75 MG PO TABS
75.0000 mg | ORAL_TABLET | Freq: Every day | ORAL | Status: DC
  Administered 2014-07-07 – 2014-07-10 (×4): 75 mg via ORAL
  Filled 2014-07-06 (×4): qty 1

## 2014-07-06 MED ORDER — SODIUM CHLORIDE 0.9 % IV BOLUS (SEPSIS)
1000.0000 mL | Freq: Once | INTRAVENOUS | Status: AC
  Administered 2014-07-06: 1000 mL via INTRAVENOUS

## 2014-07-06 MED ORDER — INSULIN ASPART 100 UNIT/ML ~~LOC~~ SOLN
0.0000 [IU] | SUBCUTANEOUS | Status: DC
  Administered 2014-07-06: 3 [IU] via SUBCUTANEOUS
  Administered 2014-07-07: 1 [IU] via SUBCUTANEOUS
  Administered 2014-07-07: 2 [IU] via SUBCUTANEOUS
  Administered 2014-07-07 – 2014-07-08 (×3): 1 [IU] via SUBCUTANEOUS

## 2014-07-06 MED ORDER — PANTOPRAZOLE SODIUM 40 MG PO TBEC: 40.0000 mg | DELAYED_RELEASE_TABLET | Freq: Every day | ORAL | Status: DC

## 2014-07-06 NOTE — ED Notes (Addendum)
Pt reports to the ED via [facility] for eval of slurred speech and decreased mental status. Per EMS he was sitting at breakfast eating when he developed slurred speech and became unresponsive. LSN 8 am, 6/28. EMS called and placed him on a NRB. He was occasionally apneic and required bagging. Pt responsive to physical stimuli on arrival and able to follow commands. Pt becoming progressively more arousable. He has a hx of recent stroke with injury to his right cerebellum. Does not meet TPA requirements. He was hypotensive on EMS arrival but now he is 138/61. Pt lethargic. Resp e/u. Skin warm and dry.

## 2014-07-06 NOTE — Progress Notes (Signed)
Patient arrived to room from ED. Safety precautions and orders reviewed with patient. Call light and possessions within reach. Will continue to monitor.    Ave Filter, RN

## 2014-07-06 NOTE — Progress Notes (Signed)
EEG completed, results pending. 

## 2014-07-06 NOTE — Progress Notes (Signed)
SLP Cancellation Note  Patient Details Name: Hayden Mckinney MRN: [id number] DOB: November 02, 1945   Cancelled treatment:       Reason Eval/Treat Not Completed: Patient at procedure or test/unavailable   [name], [name] 07/06/2014, 3:29 PM

## 2014-07-06 NOTE — ED Provider Notes (Signed)
CSN: [id number]     Arrival date & time 07/06/14  0854 History   First MD Initiated Contact with Patient 07/06/14 0914     No chief complaint on file.    (Consider location/radiation/quality/duration/timing/severity/associated sxs/prior Treatment) HPI Comments: Patient went unresponsive when eating. Hypotensive with EMS. Slurred speech afterwards.   Patient is a 69 y.o. male presenting with neurologic complaint. The history is provided by the EMS personnel and the patient.  Neurologic Problem This is a new problem. The current episode started 1 to 2 hours ago. The problem occurs constantly. The problem has not changed since onset.Pertinent negatives include no shortness of breath. Nothing aggravates the symptoms. Nothing relieves the symptoms.    Past Medical History  Diagnosis Date  . Type II diabetes mellitus   . Sarcoma     a. R leg  . Diabetic peripheral neuropathy     a. both feet  . TIA (transient ischemic attack)     a. 08/2010.  Marland Kitchen Hyperlipidemia   . Hypertension   . Stroke     a. 04/2014 multiple bateral infarcts, presumed to be embolic;  b. 04/979 Carotid U/S: 1-39% bilat ICA stenosis.  . H/O echocardiogram     a. 04/2014 Echo: EF 55-60%, no rwma.  Marland Kitchen ETOH abuse     a. 04/2014 18-24 beers q weekend.   Past Surgical History  Procedure Laterality Date  . Sarcoma removal    . Repair of r hand after trauma    . Loop recorder implant N/A 04/20/2014    Procedure: LOOP RECORDER IMPLANT;  Surgeon: Thompson Grayer, MD;  Location: [facility] CATH LAB;  Service: Cardiovascular;  Laterality: N/A;  . Tee without cardioversion N/A 04/20/2014    Procedure: TRANSESOPHAGEAL ECHOCARDIOGRAM (TEE);  Surgeon: Larey Dresser, MD;  Location: [facility] ENDOSCOPY;  Service: Cardiovascular;  Laterality: N/A;   Family History  Problem Relation Age of Onset  . Cancer Mother   . Cancer Father   . Diabetes Brother    History  Substance Use Topics  . Smoking status: Never Smoker   . Smokeless tobacco: Never  Used  . Alcohol Use: 3.6 oz/week    6 Standard drinks or equivalent per week     Comment: 18-24 beers every weekend.    Review of Systems  Unable to perform ROS: Acuity of condition  Respiratory: Negative for shortness of breath.       Allergies  Review of patient's allergies indicates no known allergies.  Home Medications   Prior to Admission medications   Medication Sig Start Date End Date Taking? Authorizing Provider  aspirin EC 81 MG EC tablet Take 1 tablet (81 mg total) by mouth daily. 04/21/14   Thurnell Lose, MD  atorvastatin (LIPITOR) 40 MG tablet Take 1 tablet (40 mg total) by mouth daily at 6 PM. 04/21/14   Thurnell Lose, MD  Blood Glucose Monitoring Suppl (RELION CONFIRM GLUCOSE MONITOR) W/DEVICE KIT Use as instructed. 12/14/11   Olga Millers, MD  carvedilol (COREG) 6.25 MG tablet Take 1 tablet (6.25 mg total) by mouth 2 (two) times daily with a meal. 04/21/14   Thurnell Lose, MD  clopidogrel (PLAVIX) 75 MG tablet Take 1 tablet (75 mg total) by mouth daily. 04/21/14   Thurnell Lose, MD  glimepiride (AMARYL) 1 MG tablet Take 1 mg by mouth daily with breakfast.    Historical Provider, MD  insulin aspart (NOVOLOG) 100 UNIT/ML injection Before each meal 3 times a day, 140-199 - 2 units,  200-250 - 4 units, 251-299 - 6 units,  300-349 - 8 units,  350 or above 10 units. Patient not taking: Reported on 05/19/2014 04/21/14   Thurnell Lose, MD  isosorbide-hydrALAZINE (BIDIL) 20-37.5 MG per tablet Take 1 tablet by mouth 3 (three) times daily. 04/21/14   Thurnell Lose, MD  pregabalin (LYRICA) 50 MG capsule Take 1 capsule (50 mg total) by mouth 2 (two) times daily. 11/21/11   Olga Millers, MD  tamsulosin (FLOMAX) 0.4 MG CAPS capsule Take 0.4 mg by mouth daily.    Historical Provider, MD   There were no vitals taken for this visit. Physical Exam  Constitutional: He appears well-developed and well-nourished. No distress.  HENT:  Head: Normocephalic and  atraumatic.  Mouth/Throat: Oropharynx is clear and moist. No oropharyngeal exudate.  Eyes: EOM are normal. Pupils are equal, round, and reactive to light.  Neck: Normal range of motion. Neck supple.  Cardiovascular: Normal rate and regular rhythm.  Exam reveals no friction rub.   No murmur heard. Pulmonary/Chest: Effort normal and breath sounds normal. No respiratory distress. He has no wheezes. He has no rales.  Abdominal: Soft. He exhibits no distension. There is no tenderness. There is no rebound.  Musculoskeletal: Normal range of motion. He exhibits no edema.  Neurological: He exhibits normal muscle tone. GCS eye subscore is 3. GCS verbal subscore is 2. GCS motor subscore is 6.  Equal bilateral grips  Skin: No rash noted. He is not diaphoretic.  Nursing note and vitals reviewed.   ED Course  Procedures (including critical care time) Labs Review Labs Reviewed  I-STAT CHEM 8, ED - Abnormal; Notable for the following:    BUN 26 (*)    Creatinine, Ser 1.60 (*)    Glucose, Bld 158 (*)    Hemoglobin 9.2 (*)    HCT 27.0 (*)    All other components within normal limits  PROTIME-INR  APTT  CBC  DIFFERENTIAL  COMPREHENSIVE METABOLIC PANEL  ETHANOL  I-STAT TROPOININ, ED  CBG MONITORING, ED    Imaging Review Ct Angio Head W/cm &/or Wo Cm  07/06/2014   CLINICAL DATA:  69 year old diabetic hypertensive male with hyperlipidemia and alcohol abuse presenting with slurred speech. History of sarcoma. Prior infarcts. Subsequent encounter.  EXAM: CT ANGIOGRAPHY HEAD AND NECK  TECHNIQUE: Multidetector CT imaging of the head and neck was performed using the standard protocol during bolus administration of intravenous contrast. Multiplanar CT image reconstructions and MIPs were obtained to evaluate the vascular anatomy. Carotid stenosis measurements (when applicable) are obtained utilizing NASCET criteria, using the distal internal carotid diameter as the denominator.  CONTRAST:  20m OMNIPAQUE  IOHEXOL 350 MG/ML SOLN  COMPARISON:  04/19/2014 CT angiogram.  04/16/2014 MR brain.  FINDINGS: CT HEAD  Brain: No intracranial hemorrhage.  Moderate-size mid to superior right cerebellar infarct with encephalomalacia (noted as acute 04/16/2014).  Bilateral remote basal ganglia and thalamic infarcts  Global atrophy without hydrocephalus.  No intracranial mass or abnormal enhancement.  Calvarium and skull base: Negative.  Paranasal sinuses: Polypoid opacification inferior right maxillary sinus.  Orbits: Negative.  CTA NECK  Aortic arch: 3 vessel aortic arch with mild ectasia of the aortic arch. Ascending thoracic aorta measures up to 3.2 cm.  Right carotid system: Carotid bifurcation plaque. Plaque extends into the proximal 1.5 cm of the right internal carotid artery with 60% diameter stenosis. Right internal carotid artery causes slight impression upon the right posterior pharynx.  Left carotid system: Ectatic left common carotid artery  with mild noncalcified plaque. Partially calcified plaque left carotid bifurcation without hemodynamically significant stenosis.  Vertebral arteries:Left vertebral artery is dominant. Moderate stenosis left subclavian artery just proximal to the takeoff of the left vertebral artery. Mild to moderate narrowing proximal left vertebral artery. Moderate to marked narrowing proximal right vertebral artery.  Skeleton: Cervical spondylotic changes most notable C5-6 and C6-7.  Other neck: No worrisome neck mass identified. Underfilling of the right piriform sinus without mass identified.  CTA HEAD  Anterior circulation: Calcified and noncalcified plaque cavernous segment internal carotid artery bilaterally with moderate narrowing anterior genu of the right internal carotid artery.  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  Moderate narrowing A2 segment branches bilaterally.  Moderate narrowing M2  segment branches of loculated more notable on the right.  Posterior circulation: Non dominant right vertebral artery predominantly ends in a posterior inferior cerebellar artery distribution with only tiny vessel traversing towards the basilar artery. Moderate narrowing of the distal right vertebral artery and right posterior inferior cerebellar artery.  Mild narrowing distal left vertebral artery.  Mild moderate narrowing of portions of the basilar artery.  Moderate tandem stenosis P1 and P2 segment posterior cerebral artery bilaterally.  Moderate narrowing superior cerebellar artery greater on right.  Poor delineation of the right anterior inferior cerebellar artery with narrowing of the left anterior inferior cerebellar artery.  Venous sinuses: Negative  Anatomic variants: As above  Delayed phase: Negative.  IMPRESSION: CT HEAD  No intracranial hemorrhage.  Moderate-size mid to superior right cerebellar infarct with encephalomalacia (noted as acute 04/16/2014).  Bilateral remote basal ganglia and thalamic infarcts.  Small vessel disease type changes without CT evidence of large acute infarct.  Global atrophy without hydrocephalus.  CTA NECK  Plaque proximal 1.5 cm of the right internal carotid artery with 60% diameter stenosis. Right internal carotid artery causes slight impression upon the right posterior pharynx.  Partially calcified plaque left carotid bifurcation without hemodynamically significant stenosis.  Left vertebral artery is dominant. Moderate stenosis left subclavian artery just proximal to the takeoff of the left vertebral artery. Mild to moderate narrowing proximal left vertebral artery.  Moderate to marked narrowing proximal right vertebral artery.  CTA HEAD  Moderate narrowing anterior genu of the right internal carotid artery.  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  Moderate narrowing A2 segment  branches bilaterally.  Moderate narrowing M2 segment branches more notable on the right.  Non dominant right vertebral artery predominantly ends in a posterior inferior cerebellar artery distribution with only tiny vessel traversing towards the basilar artery. Moderate narrowing of the distal right vertebral artery and right posterior inferior cerebellar artery.  Mild narrowing distal left vertebral artery.  Mild to moderate narrowing of portions of the basilar artery.  Moderate tandem stenosis P1 and P2 segment posterior cerebral artery bilaterally.  Moderate narrowing superior cerebellar artery greater on right.  Poor delineation of the right anterior inferior cerebellar artery with narrowing of the left anterior inferior cerebellar artery.  Images reviewed with Etta Quill neurology 39 assistant at the time of imaging.   Electronically Signed   By: Genia Del M.D.   On: 07/06/2014 10:00   X-ray Chest Pa And Lateral  07/06/2014   CLINICAL DATA:  Chest and abdominal pain.  EXAM: CHEST  2 VIEW  COMPARISON:  04/20/2014  FINDINGS: There is cardiomegaly. Lungs are  clear. No effusions. No acute bony abnormality.  IMPRESSION: Cardiomegaly.  No active disease.   Electronically Signed   By: Rolm Baptise M.D.   On: 07/06/2014 16:00   Ct Angio Neck W/cm &/or Wo/cm  07/06/2014   CLINICAL DATA:  69 year old diabetic hypertensive male with hyperlipidemia and alcohol abuse presenting with slurred speech. History of sarcoma. Prior infarcts. Subsequent encounter.  EXAM: CT ANGIOGRAPHY HEAD AND NECK  TECHNIQUE: Multidetector CT imaging of the head and neck was performed using the standard protocol during bolus administration of intravenous contrast. Multiplanar CT image reconstructions and MIPs were obtained to evaluate the vascular anatomy. Carotid stenosis measurements (when applicable) are obtained utilizing NASCET criteria, using the distal internal carotid diameter as the denominator.  CONTRAST:  54m  OMNIPAQUE IOHEXOL 350 MG/ML SOLN  COMPARISON:  04/19/2014 CT angiogram.  04/16/2014 MR brain.  FINDINGS: CT HEAD  Brain: No intracranial hemorrhage.  Moderate-size mid to superior right cerebellar infarct with encephalomalacia (noted as acute 04/16/2014).  Bilateral remote basal ganglia and thalamic infarcts  Global atrophy without hydrocephalus.  No intracranial mass or abnormal enhancement.  Calvarium and skull base: Negative.  Paranasal sinuses: Polypoid opacification inferior right maxillary sinus.  Orbits: Negative.  CTA NECK  Aortic arch: 3 vessel aortic arch with mild ectasia of the aortic arch. Ascending thoracic aorta measures up to 3.2 cm.  Right carotid system: Carotid bifurcation plaque. Plaque extends into the proximal 1.5 cm of the right internal carotid artery with 60% diameter stenosis. Right internal carotid artery causes slight impression upon the right posterior pharynx.  Left carotid system: Ectatic left common carotid artery with mild noncalcified plaque. Partially calcified plaque left carotid bifurcation without hemodynamically significant stenosis.  Vertebral arteries:Left vertebral artery is dominant. Moderate stenosis left subclavian artery just proximal to the takeoff of the left vertebral artery. Mild to moderate narrowing proximal left vertebral artery. Moderate to marked narrowing proximal right vertebral artery.  Skeleton: Cervical spondylotic changes most notable C5-6 and C6-7.  Other neck: No worrisome neck mass identified. Underfilling of the right piriform sinus without mass identified.  CTA HEAD  Anterior circulation: Calcified and noncalcified plaque cavernous segment internal carotid artery bilaterally with moderate narrowing anterior genu of the right internal carotid artery.  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  Moderate narrowing A2 segment branches bilaterally.  Moderate  narrowing M2 segment branches of loculated more notable on the right.  Posterior circulation: Non dominant right vertebral artery predominantly ends in a posterior inferior cerebellar artery distribution with only tiny vessel traversing towards the basilar artery. Moderate narrowing of the distal right vertebral artery and right posterior inferior cerebellar artery.  Mild narrowing distal left vertebral artery.  Mild moderate narrowing of portions of the basilar artery.  Moderate tandem stenosis P1 and P2 segment posterior cerebral artery bilaterally.  Moderate narrowing superior cerebellar artery greater on right.  Poor delineation of the right anterior inferior cerebellar artery with narrowing of the left anterior inferior cerebellar artery.  Venous sinuses: Negative  Anatomic variants: As above  Delayed phase: Negative.  IMPRESSION: CT HEAD  No intracranial hemorrhage.  Moderate-size mid to superior right cerebellar infarct with encephalomalacia (noted as acute 04/16/2014).  Bilateral remote basal ganglia and thalamic infarcts.  Small vessel disease type changes without CT evidence of large acute infarct.  Global atrophy without hydrocephalus.  CTA NECK  Plaque proximal 1.5 cm of the right internal carotid artery with 60% diameter  stenosis. Right internal carotid artery causes slight impression upon the right posterior pharynx.  Partially calcified plaque left carotid bifurcation without hemodynamically significant stenosis.  Left vertebral artery is dominant. Moderate stenosis left subclavian artery just proximal to the takeoff of the left vertebral artery. Mild to moderate narrowing proximal left vertebral artery.  Moderate to marked narrowing proximal right vertebral artery.  CTA HEAD  Moderate narrowing anterior genu of the right internal carotid artery.  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  Moderate  narrowing A2 segment branches bilaterally.  Moderate narrowing M2 segment branches more notable on the right.  Non dominant right vertebral artery predominantly ends in a posterior inferior cerebellar artery distribution with only tiny vessel traversing towards the basilar artery. Moderate narrowing of the distal right vertebral artery and right posterior inferior cerebellar artery.  Mild narrowing distal left vertebral artery.  Mild to moderate narrowing of portions of the basilar artery.  Moderate tandem stenosis P1 and P2 segment posterior cerebral artery bilaterally.  Moderate narrowing superior cerebellar artery greater on right.  Poor delineation of the right anterior inferior cerebellar artery with narrowing of the left anterior inferior cerebellar artery.  Images reviewed with Etta Quill neurology 36 assistant at the time of imaging.   Electronically Signed   By: Genia Del M.D.   On: 07/06/2014 10:00   [name] Contrast  07/06/2014   CLINICAL DATA:  Syncopal episode. Patient developed slowed speech and became unresponsive with hypotension at that time.  EXAM: MRI HEAD WITHOUT CONTRAST  TECHNIQUE: Multiplanar, multiecho pulse sequences of the brain and surrounding structures were obtained without intravenous contrast.  COMPARISON:  Head CTA 07/06/2014 and MRI 04/16/2014  FINDINGS: There is a 3 mm acute infarct along the lateral margin of the body of the right lateral ventricle. A chronic infarct is again seen involving the right basal ganglia and right corona radiata more posteriorly with a small amount of increased diffusion weighted signal again seen along its margins, similar to the prior study and without definite evidence of superimposed acute ischemia. A small amount of chronic blood products are again seen associated with the old right basal ganglia infarct, and chronic blood products are also again noted associated with a chronic left basal ganglia infarct. Chronic lacunar infarcts  are again noted in the thalami.  Subacute superior right cerebellar infarct is noted, acute on the prior MRI. Patchy T2 hyperintensities in the cerebral white matter bilaterally are similar to the prior MRI and compatible with moderate chronic small vessel ischemic disease. There is mild to moderate cerebral atrophy. There is no evidence of mass, midline shift, or extra-axial fluid collection.  Orbits are unremarkable. Right maxillary sinus mucous retention cyst is noted. No significant mastoid fluid. Major intracranial arterial flow voids are preserved. Right vertebral artery is again noted to be hypoplastic. Abnormal left transverse sinus, left sigmoid sinus, and left jugular bulb flow voids are unchanged from the prior MRI and may reflect slow flow as there was no evidence of venous sinus thrombosis on CTA earlier today.  IMPRESSION: 1. Punctate acute infarct adjacent to the right lateral ventricle. 2. Chronic bilateral basal ganglia and the laminae ache infarcts and subacute right cerebellar infarct. 3. Moderate chronic small vessel ischemic disease.   Electronically Signed   By: Logan Bores   On: 07/06/2014 13:31   Dg Abd 2 Views  07/06/2014   CLINICAL DATA:  Chest and abdominal  pain.  EXAM: ABDOMEN - 2 VIEW  COMPARISON:  None.  FINDINGS: Nonobstructive bowel gas pattern. No free air. No organomegaly. Contrast material is noted in the bladder. No acute bony abnormality. No suspicious calcification visualized.  IMPRESSION: Negative.   Electronically Signed   By: Rolm Baptise M.D.   On: 07/06/2014 16:01     EKG Interpretation   Date/Time:  Tuesday July 06 2014 09:19:29 EDT Ventricular Rate:  66 PR Interval:  192 QRS Duration: 139 QT Interval:  456 QTC Calculation: 478 R Axis:   -69 Text Interpretation:  Sinus rhythm Right bundle branch block No  significant change since last tracing Confirmed by Mingo Amber  MD, Makell Cyr  ([id number]) on 07/06/2014 9:21:46 AM      MDM   Final diagnoses:  Slurred speech   Syncope and collapse    69 year old male here with possible stroke. Was eating and went unresponsive. He is hypotensive and had slurred speech after that with a will to nursing home. There was some questionable unilateral weakness. Here GCS of 9. He is following commands, eyes opening to pain, he has income ransom will verbal response. He does have recent history of strokes and he is not a TPA candidate. We'll proceed with CT. I will leave the patient in the room and with neurology while he was getting his CT head. CT head without any acute bleed. CTA also performed. A repeat exam, eyes were opening to speech, following commands. Good grip strength bilaterally. EMS tried more history that he was mildly hypotensive afterwards and he just went unresponsive while eating. No seizure-like activity. Questionable seizure versus syncope versus stroke. We'll give fluids.  CT with no acute infarct. Admitted.   Evelina Bucy, MD 07/06/14 [phone]

## 2014-07-06 NOTE — Progress Notes (Signed)
OT Cancellation Note  Patient Details Name: Hayden Mckinney MRN: [id number] DOB: [birth year]   Cancelled Treatment:    Reason Eval/Treat Not Completed: Medical issues which prohibited therapy (bedrest until 9 pm)  Peri Maris  Pager: [phone]  07/06/2014, 11:19 AM

## 2014-07-06 NOTE — H&P (Signed)
[facility] History and Physical                                                                                    Hayden Mckinney, is a 69 y.o. male  MRN: [id number]   DOB - 1945/04/14  Admit Date - 07/06/2014  Outpatient Primary MD for the patient is Blanchie Serve, MD  Referring Physician:  Dr. Mingo Amber  Chief Complaint:   Chief Complaint  Patient presents with  . Code Stroke     HPI  Hayden Mckinney  is a 69 y.o. male, with type 2 diabetes, CVA, hypertension, hyperlipidemia, who presents from skilled nursing facility after a syncopal episode. The patient was last seen normal at 8 AM when he was wheeled in to the cafeteria for breakfast. The patient is very difficult to understand but states that his cornflakes had no flavor, and he felt as though he were on a roller coaster. Reportedly he became nonresponsive and EMS was called. He was found to be hypotensive. In the emergency department intubation was considered as he was unresponsive and having apneic episodes. He subsequently improved and was able to follow commands and speak. The patient complains of left-sided abdominal pain that has been going on for some time. His last bowel movement was 3 days ago. He denies any vomiting or nausea. Further he denies any fever, chest pain, shortness of breath. The patient's speech is very difficult to understand so history is somewhat limited.   In the emergency department his CT head was negative. Creatinine is mildly elevated. Hemoglobin is slightly down. He was seen by neurology who would like for him to have an MR brain and an EEG, but they recommended holding off on the whole stroke workup as it was recently completed in April 2016  Review of Systems   In addition to the HPI above,  No Fever-chills, No Headache, No changes with Vision or hearing, ++ has history of dysphagia.  No Chest pain, Cough or Shortness of Breath, ++ abdominal pain, No Nausea or Vomiting, Bowel movements are  regular, No Blood in stool or Urine, No dysuria, No new skin rashes or bruises, No new joints pains-aches,  No new weakness, tingling, numbness in any extremity, No recent weight gain or loss, A full 10 point Review of Systems was done, except as stated above, all other Review of Systems were negative.  Past Medical History  Past Medical History  Diagnosis Date  . Type II diabetes mellitus   . Sarcoma     a. R leg  . Diabetic peripheral neuropathy     a. both feet  . TIA (transient ischemic attack)     a. 08/2010.  Marland Kitchen Hyperlipidemia   . Hypertension   . Stroke     a. 04/2014 multiple bateral infarcts, presumed to be embolic;  b. 04/979 Carotid U/S: 1-39% bilat ICA stenosis.  . H/O echocardiogram     a. 04/2014 Echo: EF 55-60%, no rwma.  Marland Kitchen ETOH abuse     a. 04/2014 18-24 beers q weekend.  . CVA (cerebral vascular accident)     Past Surgical History  Procedure Laterality Date  . Sarcoma removal    .  Repair of r hand after trauma    . Loop recorder implant N/A 04/20/2014    Procedure: LOOP RECORDER IMPLANT;  Surgeon: Thompson Grayer, MD;  Location: [facility] CATH LAB;  Service: Cardiovascular;  Laterality: N/A;  . Tee without cardioversion N/A 04/20/2014    Procedure: TRANSESOPHAGEAL ECHOCARDIOGRAM (TEE);  Surgeon: Larey Dresser, MD;  Location: [facility] ENDOSCOPY;  Service: Cardiovascular;  Laterality: N/A;      Social History History  Substance Use Topics  . Smoking status: Never Smoker   . Smokeless tobacco: Never Used  . Alcohol Use: 3.6 oz/week    6 Standard drinks or equivalent per week     Comment: 18-24 beers every weekend.  Now lives in SNF.  Family History Family History  Problem Relation Age of Onset  . Cancer Mother   . Cancer Father   . Diabetes Brother   No hx of stroke in family.  Prior to Admission medications   Medication Sig Start Date End Date Taking? Authorizing Provider  aspirin EC 81 MG EC tablet Take 1 tablet (81 mg total) by mouth daily. 04/21/14  Yes  Thurnell Lose, MD  atorvastatin (LIPITOR) 40 MG tablet Take 1 tablet (40 mg total) by mouth daily at 6 PM. 04/21/14  Yes Thurnell Lose, MD  Blood Glucose Monitoring Suppl (RELION CONFIRM GLUCOSE MONITOR) W/DEVICE KIT Use as instructed. 12/14/11  Yes Olga Millers, MD  carvedilol (COREG) 6.25 MG tablet Take 1 tablet (6.25 mg total) by mouth 2 (two) times daily with a meal. 04/21/14  Yes Thurnell Lose, MD  clopidogrel (PLAVIX) 75 MG tablet Take 1 tablet (75 mg total) by mouth daily. 04/21/14  Yes Thurnell Lose, MD  glimepiride (AMARYL) 1 MG tablet Take 1 mg by mouth daily with breakfast.   Yes Historical Provider, MD  isosorbide-hydrALAZINE (BIDIL) 20-37.5 MG per tablet Take 1 tablet by mouth 3 (three) times daily. 04/21/14  Yes Thurnell Lose, MD  loperamide (IMODIUM) 2 MG capsule Take 2 mg by mouth as needed for diarrhea or loose stools.   Yes Historical Provider, MD  pregabalin (LYRICA) 50 MG capsule Take 1 capsule (50 mg total) by mouth 2 (two) times daily. 11/21/11  Yes Olga Millers, MD  tamsulosin (FLOMAX) 0.4 MG CAPS capsule Take 0.4 mg by mouth daily.   Yes Historical Provider, MD  insulin aspart (NOVOLOG) 100 UNIT/ML injection Before each meal 3 times a day, 140-199 - 2 units, 200-250 - 4 units, 251-299 - 6 units,  300-349 - 8 units,  350 or above 10 units. Patient not taking: Reported on 05/19/2014 04/21/14   Thurnell Lose, MD    No Known Allergies  Physical Exam  Vitals  Blood pressure 111/52, pulse 62, temperature 97.5 F (36.4 C), temperature source Oral, resp. rate 13, weight 71.2 kg (156 lb 15.5 oz), SpO2 100 %.   General:  Elderly, wd male, chronically ill appearing, lying in bed in NAD,  with slurred speech.   Psych:  Normal affect and insight, Not Suicidal or Homicidal, Awake   Neuro:   No new acute deficits. Patient continues to have slurred speech, he has decreased sensation in his left lower extremity. Otherwise his strength is symmetric.   ENT:   Ears and Eyes appear Normal, Conjunctivae clear, PER. dry  oral mucosa without erythema or exudates.  Neck:  Supple, No lymphadenopathy appreciated  Respiratory:  Symmetrical chest wall movement, Good air movement bilaterally, CTAB.  Cardiac:  RRR, No Murmurs, no LE  edema noted, no JVD.    Abdomen:  Positive bowel sounds, Soft, mild tenderness in left lower abdomen., Non distended,  No masses appreciated  Skin:  No Cyanosis, Normal Skin Turgor, No Skin Rash or Bruise.  Extremities:  Able to move all 4. 5/5 strength in each,  no effusions.  Data Review  CBC  Recent Labs Lab 07/06/14 0855 07/06/14 0900  WBC 6.8  --   HGB 8.9* 9.2*  HCT 27.5* 27.0*  PLT 317  --   MCV 84.4  --   MCH 27.3  --   MCHC 32.4  --   RDW 13.3  --   LYMPHSABS 1.9  --   MONOABS 0.7  --   EOSABS 0.1  --   BASOSABS 0.0  --     Chemistries   Recent Labs Lab 07/06/14 0900  NA 138  K 4.9  CL 108  GLUCOSE 158*  BUN 26*  CREATININE 1.60*    Coagulation profile  Recent Labs Lab 07/06/14 0855  INR 1.09    Imaging results:   Ct Angio Head W/cm &/or Wo Cm  07/06/2014   CLINICAL DATA:  69 year old diabetic hypertensive male with hyperlipidemia and alcohol abuse presenting with slurred speech. History of sarcoma. Prior infarcts. Subsequent encounter.  EXAM: CT ANGIOGRAPHY HEAD AND NECK  TECHNIQUE: Multidetector CT imaging of the head and neck was performed using the standard protocol during bolus administration of intravenous contrast. Multiplanar CT image reconstructions and MIPs were obtained to evaluate the vascular anatomy. Carotid stenosis measurements (when applicable) are obtained utilizing NASCET criteria, using the distal internal carotid diameter as the denominator.  CONTRAST:  52m OMNIPAQUE IOHEXOL 350 MG/ML SOLN  COMPARISON:  04/19/2014 CT angiogram.  04/16/2014 MR brain.  FINDINGS: CT HEAD  Brain: No intracranial hemorrhage.  Moderate-size mid to superior right cerebellar infarct with  encephalomalacia (noted as acute 04/16/2014).  Bilateral remote basal ganglia and thalamic infarcts  Global atrophy without hydrocephalus.  No intracranial mass or abnormal enhancement.  Calvarium and skull base: Negative.  Paranasal sinuses: Polypoid opacification inferior right maxillary sinus.  Orbits: Negative.  CTA NECK  Aortic arch: 3 vessel aortic arch with mild ectasia of the aortic arch. Ascending thoracic aorta measures up to 3.2 cm.  Right carotid system: Carotid bifurcation plaque. Plaque extends into the proximal 1.5 cm of the right internal carotid artery with 60% diameter stenosis. Right internal carotid artery causes slight impression upon the right posterior pharynx.  Left carotid system: Ectatic left common carotid artery with mild noncalcified plaque. Partially calcified plaque left carotid bifurcation without hemodynamically significant stenosis.  Vertebral arteries:Left vertebral artery is dominant. Moderate stenosis left subclavian artery just proximal to the takeoff of the left vertebral artery. Mild to moderate narrowing proximal left vertebral artery. Moderate to marked narrowing proximal right vertebral artery.  Skeleton: Cervical spondylotic changes most notable C5-6 and C6-7.  Other neck: No worrisome neck mass identified. Underfilling of the right piriform sinus without mass identified.  CTA HEAD  Anterior circulation: Calcified and noncalcified plaque cavernous segment internal carotid artery bilaterally with moderate narrowing anterior genu of the right internal carotid artery.  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  Moderate narrowing A2 segment branches bilaterally.  Moderate narrowing M2 segment branches of loculated more notable on the right.  Posterior circulation: Non dominant right vertebral artery predominantly ends in a posterior inferior cerebellar artery distribution with only  tiny vessel traversing  towards the basilar artery. Moderate narrowing of the distal right vertebral artery and right posterior inferior cerebellar artery.  Mild narrowing distal left vertebral artery.  Mild moderate narrowing of portions of the basilar artery.  Moderate tandem stenosis P1 and P2 segment posterior cerebral artery bilaterally.  Moderate narrowing superior cerebellar artery greater on right.  Poor delineation of the right anterior inferior cerebellar artery with narrowing of the left anterior inferior cerebellar artery.  Venous sinuses: Negative  Anatomic variants: As above  Delayed phase: Negative.  IMPRESSION: CT HEAD  No intracranial hemorrhage.  Moderate-size mid to superior right cerebellar infarct with encephalomalacia (noted as acute 04/16/2014).  Bilateral remote basal ganglia and thalamic infarcts.  Small vessel disease type changes without CT evidence of large acute infarct.  Global atrophy without hydrocephalus.  CTA NECK  Plaque proximal 1.5 cm of the right internal carotid artery with 60% diameter stenosis. Right internal carotid artery causes slight impression upon the right posterior pharynx.  Partially calcified plaque left carotid bifurcation without hemodynamically significant stenosis.  Left vertebral artery is dominant. Moderate stenosis left subclavian artery just proximal to the takeoff of the left vertebral artery. Mild to moderate narrowing proximal left vertebral artery.  Moderate to marked narrowing proximal right vertebral artery.  CTA HEAD  Moderate narrowing anterior genu of the right internal carotid artery.  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  Moderate narrowing A2 segment branches bilaterally.  Moderate narrowing M2 segment branches more notable on the right.  Non dominant right vertebral artery predominantly ends in a posterior inferior cerebellar artery distribution  with only tiny vessel traversing towards the basilar artery. Moderate narrowing of the distal right vertebral artery and right posterior inferior cerebellar artery.  Mild narrowing distal left vertebral artery.  Mild to moderate narrowing of portions of the basilar artery.  Moderate tandem stenosis P1 and P2 segment posterior cerebral artery bilaterally.  Moderate narrowing superior cerebellar artery greater on right.  Poor delineation of the right anterior inferior cerebellar artery with narrowing of the left anterior inferior cerebellar artery.  Images reviewed with Etta Quill neurology 108 assistant at the time of imaging.   Electronically Signed   By: Genia Del M.D.   On: 07/06/2014 10:00   Ct Angio Neck W/cm &/or Wo/cm  07/06/2014   CLINICAL DATA:  69 year old diabetic hypertensive male with hyperlipidemia and alcohol abuse presenting with slurred speech. History of sarcoma. Prior infarcts. Subsequent encounter.  EXAM: CT ANGIOGRAPHY HEAD AND NECK  TECHNIQUE: Multidetector CT imaging of the head and neck was performed using the standard protocol during bolus administration of intravenous contrast. Multiplanar CT image reconstructions and MIPs were obtained to evaluate the vascular anatomy. Carotid stenosis measurements (when applicable) are obtained utilizing NASCET criteria, using the distal internal carotid diameter as the denominator.  CONTRAST:  95m OMNIPAQUE IOHEXOL 350 MG/ML SOLN  COMPARISON:  04/19/2014 CT angiogram.  04/16/2014 MR brain.  FINDINGS: CT HEAD  Brain: No intracranial hemorrhage.  Moderate-size mid to superior right cerebellar infarct with encephalomalacia (noted as acute 04/16/2014).  Bilateral remote basal ganglia and thalamic infarcts  Global atrophy without hydrocephalus.  No intracranial mass or abnormal enhancement.  Calvarium and skull base: Negative.  Paranasal sinuses: Polypoid opacification inferior right maxillary sinus.  Orbits: Negative.  CTA NECK  Aortic arch:  3 vessel aortic arch with mild ectasia of the aortic arch. Ascending thoracic aorta measures up to 3.2 cm.  Right carotid  system: Carotid bifurcation plaque. Plaque extends into the proximal 1.5 cm of the right internal carotid artery with 60% diameter stenosis. Right internal carotid artery causes slight impression upon the right posterior pharynx.  Left carotid system: Ectatic left common carotid artery with mild noncalcified plaque. Partially calcified plaque left carotid bifurcation without hemodynamically significant stenosis.  Vertebral arteries:Left vertebral artery is dominant. Moderate stenosis left subclavian artery just proximal to the takeoff of the left vertebral artery. Mild to moderate narrowing proximal left vertebral artery. Moderate to marked narrowing proximal right vertebral artery.  Skeleton: Cervical spondylotic changes most notable C5-6 and C6-7.  Other neck: No worrisome neck mass identified. Underfilling of the right piriform sinus without mass identified.  CTA HEAD  Anterior circulation: Calcified and noncalcified plaque cavernous segment internal carotid artery bilaterally with moderate narrowing anterior genu of the right internal carotid artery.  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  Moderate narrowing A2 segment branches bilaterally.  Moderate narrowing M2 segment branches of loculated more notable on the right.  Posterior circulation: Non dominant right vertebral artery predominantly ends in a posterior inferior cerebellar artery distribution with only tiny vessel traversing towards the basilar artery. Moderate narrowing of the distal right vertebral artery and right posterior inferior cerebellar artery.  Mild narrowing distal left vertebral artery.  Mild moderate narrowing of portions of the basilar artery.  Moderate tandem stenosis P1 and P2 segment posterior cerebral artery  bilaterally.  Moderate narrowing superior cerebellar artery greater on right.  Poor delineation of the right anterior inferior cerebellar artery with narrowing of the left anterior inferior cerebellar artery.  Venous sinuses: Negative  Anatomic variants: As above  Delayed phase: Negative.  IMPRESSION: CT HEAD  No intracranial hemorrhage.  Moderate-size mid to superior right cerebellar infarct with encephalomalacia (noted as acute 04/16/2014).  Bilateral remote basal ganglia and thalamic infarcts.  Small vessel disease type changes without CT evidence of large acute infarct.  Global atrophy without hydrocephalus.  CTA NECK  Plaque proximal 1.5 cm of the right internal carotid artery with 60% diameter stenosis. Right internal carotid artery causes slight impression upon the right posterior pharynx.  Partially calcified plaque left carotid bifurcation without hemodynamically significant stenosis.  Left vertebral artery is dominant. Moderate stenosis left subclavian artery just proximal to the takeoff of the left vertebral artery. Mild to moderate narrowing proximal left vertebral artery.  Moderate to marked narrowing proximal right vertebral artery.  CTA HEAD  Moderate narrowing anterior genu of the right internal carotid artery.  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  Moderate narrowing A2 segment branches bilaterally.  Moderate narrowing M2 segment branches more notable on the right.  Non dominant right vertebral artery predominantly ends in a posterior inferior cerebellar artery distribution with only tiny vessel traversing towards the basilar artery. Moderate narrowing of the distal right vertebral artery and right posterior inferior cerebellar artery.  Mild narrowing distal left vertebral artery.  Mild to moderate narrowing of portions of the basilar artery.  Moderate tandem stenosis P1 and P2 segment posterior  cerebral artery bilaterally.  Moderate narrowing superior cerebellar artery greater on right.  Poor delineation of the right anterior inferior cerebellar artery with narrowing of the left anterior inferior cerebellar artery.  Images reviewed with Etta Quill neurology 69 assistant at the time of imaging.   Electronically Signed   By: Remo Lipps  Jeannine Kitten M.D.   On: 07/06/2014 10:00    My personal review of EKG: NSR, RBBB, no significant change from 04/16/2014   Assessment & Plan  Principal Problem:   Syncopal episodes Active Problems:   Type II diabetes mellitus with neurological manifestations   HTN (hypertension)   Hyperlipidemia   Left sided abdominal pain  episode of unresponsiveness  TIA versus seizure versus syncope in the setting of a right-sided infarct in April 2016 CTA head and neck show previous infarcts and carotid stenosis.   evaluated by neurology. Who recommends MR brain and EEG. These have been ordered.   further neurology recommends continuing Plavix and increasing aspirin to 300.5 g daily.  Hypotension in a patient who is normally hypertensive. This could have been the cause of his unresponsive episode. Patient was reportedly hypotensive during and immediately after the episode. He received IV fluids in the emergency department and now is normotensive Holding oral blood pressure medications due to hypo-tension and nothing by mouth status. Will give IV fluids. Check orthostatics. PT evaluation.  Slurred speech Patient has a history of same, but became acutely worse this morning. Failed bedside swallow eval in ER. Will ask for repeat speech evaluation this admission. Currently nothing by mouth.  Left lower quadrant abdominal pain Tender to palpation. Patient states last bowel movement was 3 days ago. Will check two-view x-ray. Give stool softeners/suppository.  Start pepcid when able to take POs.  As patient is on Asa and plavix.  Normocytic anemia guiac stool.     Type 2 diabetes Currently nothing by mouth. We'll start every 4 hours CBGs with sliding scale insulin coverage.  Hyperlipidemia Currently nothing by mouth holding statin  Consultants Called:   neurology   Family Communication:   None at bedside  Code Status:  Full  Condition:  Guarded.  Potential Disposition: to SNF potentially 6/29  Time spent in minutes : [address],  PA-C on 07/06/2014 at 10:47 AM Between 7am to 7pm - Pager - [phone] After 7pm go to [email or web address] - password [email or web address] And look for the night coverage person covering me after hours  [facility]

## 2014-07-06 NOTE — Code Documentation (Signed)
69yo male arriving to [facility] via [place] at [phone].  EMS reports that the patient is from a nursing facility where he was at breakfast and noticed to have slurred speech.  EMS activated a code stroke.  Stroke team at the bedside on arrival. Patient drowsy on arrival.  Labs drawn and patient cleared by Dr. Mingo Amber.  Patient to CT with team.  CTA completed per MD order.  NIHSS 6, see documentation for details and code stroke times.  Patient with improved LOC.  Patient with slurred speech and left arm drift.  Patient with multiple strokes on MRI during admission to [facility] in April 2016.  Patient is contraindicated for tPA d/t recent stroke and not a candidate for endovascular intervention at this time.  Bedside handoff with ED RN [place].

## 2014-07-06 NOTE — Progress Notes (Signed)
PT Cancellation Note  Patient Details Name: Hayden Mckinney MRN: [id number] DOB: 1945/04/09   Cancelled Treatment:    Reason Eval/Treat Not Completed: Patient at procedure (at [place])   Duncan Dull 07/06/2014, 12:44 PM Alben Deeds, [place] DPT  [phone]

## 2014-07-06 NOTE — Consult Note (Signed)
Consult Reason for Consult: slurred speech and altered mental status Referring Physician: Dr Mingo Amber  CC: slurred speech and altered mental status  HPI: Hayden Mckinney is an 69 y.o. male hx of recent stroke admission, DM, HTN, HLD presenting with acute onset of altered mental status. Per EMS he was sitting at breakfast when he developed slurred speech and then became unresponsive. LSW 0800. Per EMS he was initially completely unresponsive with occasional apneic events. Became gradually more responsive upon EMS arrival to ED. EMS notes he was initially hypotensive with systolic BP in the 123XX123. Takes a daily ASA 81mg .   Initial NIHSS of 6. CT head and CT angiogram imaging reviewed with neuro-radiology. No acute process noted.     Last seen well: 07/06/2015 at 0800 tPA not given due to recent stroke < 3 months ago  Past Medical History  Diagnosis Date  . Type II diabetes mellitus   . Sarcoma     a. R leg  . Diabetic peripheral neuropathy     a. both feet  . TIA (transient ischemic attack)     a. 08/2010.  Marland Kitchen Hyperlipidemia   . Hypertension   . Stroke     a. 04/2014 multiple bateral infarcts, presumed to be embolic;  b. 123XX123 Carotid U/S: 1-39% bilat ICA stenosis.  . H/O echocardiogram     a. 04/2014 Echo: EF 55-60%, no rwma.  Marland Kitchen ETOH abuse     a. 04/2014 18-24 beers q weekend.  . CVA (cerebral vascular accident)     Past Surgical History  Procedure Laterality Date  . Sarcoma removal    . Repair of r hand after trauma    . Loop recorder implant N/A 04/20/2014    Procedure: LOOP RECORDER IMPLANT;  Surgeon: Thompson Grayer, MD;  Location: [facility] CATH LAB;  Service: Cardiovascular;  Laterality: N/A;  . Tee without cardioversion N/A 04/20/2014    Procedure: TRANSESOPHAGEAL ECHOCARDIOGRAM (TEE);  Surgeon: Larey Dresser, MD;  Location: [facility] ENDOSCOPY;  Service: Cardiovascular;  Laterality: N/A;    Family History  Problem Relation Age of Onset  . Cancer Mother   . Cancer Father   . Diabetes  Brother     Social History:  reports that he has never smoked. He has never used smokeless tobacco. He reports that he drinks about 3.6 oz of alcohol per week. He reports that he does not use illicit drugs.  No Known Allergies  Medications: I have reviewed the patient's current medications.  ROS: Out of a complete 14 system review, the patient complains of only the following symptoms, and all other reviewed systems are negative. + confusion  Physical Examination: Filed Vitals:   07/06/14 0915  BP: 138/61  Pulse: 65  Resp: 10   Physical Exam  Constitutional: He appears well-developed and well-nourished.  Psych: Affect appropriate to situation Eyes: No scleral injection HENT: No OP obstrucion Head: Normocephalic.  Cardiovascular: Normal rate and regular rhythm.  Respiratory: Effort normal and breath sounds normal.  GI: Soft. Bowel sounds are normal. No distension. There is no tenderness.  Skin: WDI  Neurologic Examination Mental Status: Lethargic but aroused by voice. Oriented to name, hospital, "July" for date. Follows simple commands. No aphasia noted, moderate dysarthria.  Cranial Nerves: II: optic discs not visualized, visual fields grossly normal, pupils equal, round, reactive to light and accommodation III,IV, VI: ptosis not present, extra-ocular motions intact bilaterally V,VII: smile symmetric, facial light touch sensation normal bilaterally VIII: hearing normal bilaterally IX,X: gag reflex present XI: trapezius  strength/neck flexion strength normal bilaterally XII: tongue strength normal  Motor: noted LUE and bilateral LE drift Right : Upper extremity    Left:     Upper extremity 5/5 deltoid       5-/5 deltoid 5/5 biceps      5-/5 biceps  5/5 triceps      5/5 triceps 5/5 hand grip      5/5 hand grip  Lower extremity     Lower extremity 4+/5 hip flexor      4+/5 hip flexor 5-/5 quadricep      5-/5 quadriceps  5/5 hamstrings     5/5 hamstrings 5/5 plantar  flexion       5/5 plantar flexion 5/5 plantar extension     5/5 plantar extension Tone and bulk:normal tone throughout; no atrophy noted Sensory: Pinprick and light touch intact throughout, bilaterally Deep Tendon Reflexes: 2+ and symmetric throughout Plantars: Right: downgoing   Left: downgoing Cerebellar: Dysmetria with FTN on the right, and normal heel-to-shin test Gait: deferred  Laboratory Studies:   Basic Metabolic Panel:  Recent Labs Lab 07/06/14 0900  NA 138  K 4.9  CL 108  GLUCOSE 158*  BUN 26*  CREATININE 1.60*    Liver Function Tests: No results for input(s): AST, ALT, ALKPHOS, BILITOT, PROT, ALBUMIN in the last 168 hours. No results for input(s): LIPASE, AMYLASE in the last 168 hours. No results for input(s): AMMONIA in the last 168 hours.  CBC:  Recent Labs Lab 07/06/14 0855 07/06/14 0900  WBC 6.8  --   NEUTROABS 4.0  --   HGB 8.9* 9.2*  HCT 27.5* 27.0*  MCV 84.4  --   PLT 317  --     Cardiac Enzymes: No results for input(s): CKTOTAL, CKMB, CKMBINDEX, TROPONINI in the last 168 hours.  BNP: Invalid input(s): POCBNP  CBG:  Recent Labs Lab 07/06/14 0923  GLUCAP 142*    Microbiology: Results for orders placed or performed during the hospital encounter of 04/21/14  Urine culture     Status: None   Collection Time: 04/23/14  7:09 AM  Result Value Ref Range Status   Specimen Description URINE, CATHETERIZED  Final   Special Requests NONE  Final   Colony Count NO GROWTH Performed at [facility]   Final   Culture NO GROWTH Performed at [facility]   Final   Report Status 04/24/2014 FINAL  Final    Coagulation Studies:  Recent Labs  07/06/14 0855  LABPROT 14.3  INR 1.09    Urinalysis: No results for input(s): COLORURINE, LABSPEC, PHURINE, GLUCOSEU, HGBUR, BILIRUBINUR, KETONESUR, PROTEINUR, UROBILINOGEN, NITRITE, LEUKOCYTESUR in the last 168 hours.  Invalid input(s): APPERANCEUR  Lipid Panel:     Component  Value Date/Time   CHOL 254* 04/17/2014 1543   TRIG 276* 04/17/2014 1543   HDL 37* 04/17/2014 1543   CHOLHDL 6.9 04/17/2014 1543   VLDL 55* 04/17/2014 1543   LDLCALC 162* 04/17/2014 1543    HgbA1C:  Lab Results  Component Value Date   HGBA1C 9.0* 04/18/2014    Urine Drug Screen:  No results found for: LABOPIA, COCAINSCRNUR, LABBENZ, AMPHETMU, THCU, LABBARB  Alcohol Level: No results for input(s): ETH in the last 168 hours.  Other results:  Imaging: No results found.   Assessment/Plan:  69y/o gentleman with history of recent stroke, DM, HTN, HLD presenting with acute onset of slurred speech and altered mental status that is gradually improving. Initial NIHSS of 6. Not a IV tPA candidate due to  recent stroke within the past 3 months. CT angiogram shows no large vessel occlusion. Differential includes TIA/CVA vs seizure/post ictal vs possible syncopal episode due to hypotension (though less likely based on prolonged confusion).   -MRI brain. No need to repeat rest of stroke workup as it was recently done -increase ASA to 325mg  daily -check EEG -IV hydration (received IV contrast in ED) -rehab and speech evaluation -NPO until swallow screen  Jim Like, DO [facility] [phone]  If 7pm- 7am, please page neurology on call as listed in [place]. 07/06/2014, 9:31 AM

## 2014-07-06 NOTE — Evaluation (Signed)
Clinical/Bedside Swallow Evaluation Patient Details  Name: Hayden Mckinney MRN: [id number] Date of Birth: May 16, 1945  Today's Date: 07/06/2014 Time: SLP Start Time (ACUTE ONLY): 1629 SLP Stop Time (ACUTE ONLY): 1643 SLP Time Calculation (min) (ACUTE ONLY): 14 min  Past Medical History:  Past Medical History  Diagnosis Date  . Type II diabetes mellitus   . Sarcoma     a. R leg  . Diabetic peripheral neuropathy     a. both feet  . TIA (transient ischemic attack)     a. 08/2010.  Marland Kitchen Hyperlipidemia   . Hypertension   . Stroke     a. 04/2014 multiple bateral infarcts, presumed to be embolic;  b. 123XX123 Carotid U/S: 1-39% bilat ICA stenosis.  . H/O echocardiogram     a. 04/2014 Echo: EF 55-60%, no rwma.  Marland Kitchen ETOH abuse     a. 04/2014 18-24 beers q weekend.  . CVA (cerebral vascular accident)    Past Surgical History:  Past Surgical History  Procedure Laterality Date  . Sarcoma removal    . Repair of r hand after trauma    . Loop recorder implant N/A 04/20/2014    Procedure: LOOP RECORDER IMPLANT;  Surgeon: Thompson Grayer, MD;  Location: [facility] CATH LAB;  Service: Cardiovascular;  Laterality: N/A;  . Tee without cardioversion N/A 04/20/2014    Procedure: TRANSESOPHAGEAL ECHOCARDIOGRAM (TEE);  Surgeon: Larey Dresser, MD;  Location: [place];  Service: Cardiovascular;  Laterality: N/A;   HPI:  Hayden Mckinney is an 69 y.o. male hx of recent stroke admission, DM, HTN, HLD presenting with acute onset of altered mental status. Per EMS he was sitting at breakfast when he developed slurred speech and then became unresponsive. MRI shows acute infarct adjacent to the right lateral ventricle. Pt subjectively reports that his symptoms have resolved.   Assessment / Plan / Recommendation Clinical Impression  Pt has midlly prolonged mastication as he is edentulous, with otherwise good oral control and clearance. He does not show overt signs of aspiration until he is challenged with large, consecutive  sips of thin liquids, with cough elicited x1. Recommend initiation of mechanical soft diet and thin liquids by small, single sips. SLP to follow briefly for tolerance.    Aspiration Risk  Mild    Diet Recommendation Dysphagia 3 (Mech soft);Thin   Medication Administration: Whole meds with puree Compensations: Slow rate;Small sips/bites    Other  Recommendations Oral Care Recommendations: Oral care BID   Follow Up Recommendations       Frequency and Duration min 1 x/week  1 week   Pertinent Vitals/Pain n/a    SLP Swallow Goals     Swallow Study Prior Functional Status       General Other Pertinent Information: Hayden Mckinney is an 69 y.o. male hx of recent stroke admission, DM, HTN, HLD presenting with acute onset of altered mental status. Per EMS he was sitting at breakfast when he developed slurred speech and then became unresponsive. MRI shows acute infarct adjacent to the right lateral ventricle. Pt subjectively reports that his symptoms have resolved. Type of Study: Bedside swallow evaluation Previous Swallow Assessment: pt with h/o dysphagia from recent admission with most recent MBS recommending Dys 3 diet and thin liquids Diet Prior to this Study: NPO Temperature Spikes Noted: No Respiratory Status: Room air History of Recent Intubation: No Behavior/Cognition: Alert;Cooperative;Pleasant mood Oral Cavity - Dentition: Edentulous Self-Feeding Abilities: Able to feed self Patient Positioning: Upright in bed Baseline Vocal Quality: Low vocal  intensity Volitional Cough: Strong Volitional Swallow: Able to elicit    Oral/Motor/Sensory Function Overall Oral Motor/Sensory Function: Impaired at baseline   Ice Chips Ice chips: Not tested   Thin Liquid Thin Liquid: Impaired Presentation: Cup;Self Fed;Straw Pharyngeal  Phase Impairments: Cough - Immediate (x1)    Nectar Thick Nectar Thick Liquid: Not tested   Honey Thick Honey Thick Liquid: Not tested   Puree Puree:  Within functional limits Presentation: Self Fed;Spoon   Solid   GO Functional Assessment Tool Used: skilled clinical judgment Functional Limitations: Swallowing Swallow Current Status [id number]): At least 1 percent but less than 20 percent impaired, limited or restricted Swallow Goal Status [phone]): At least 1 percent but less than 20 percent impaired, limited or restricted  Solid: Within functional limits (given lack of dentition) Presentation: Self Fed        Hayden Mckinney, M.A. CCC-SLP [phone]  Hayden Mckinney 07/06/2014,4:54 PM

## 2014-07-06 NOTE — Procedures (Signed)
History: 69 yo M with syncope  Sedation: none  Technique: This is a 19 channel routine scalp EEG performed at the bedside with bipolar and monopolar montages arranged in accordance to the international 10/20 system of electrode placement. One channel was dedicated to EKG recording.    Background: The background consists of intermixed alpha and beta activities. There is a well defined posterior dominant rhythm of 8.5 Hz that attenuates with eye opening. There is anterior shifting of the PDR with drowsiness, but sleep is not recorded.  Photic stimulation: Physiologic driving is not performed  EEG Abnormalities: none  Clinical Interpretation: This normal EEG is recorded in the waking and drowsy state. There was no seizure or seizure predisposition recorded on this study.   Roland Rack, MD [facility] [phone]  If 7pm- 7am, please page neurology on call as listed in [place].

## 2014-07-07 DIAGNOSIS — Z8673 Personal history of transient ischemic attack (TIA), and cerebral infarction without residual deficits: Secondary | ICD-10-CM | POA: Diagnosis not present

## 2014-07-07 DIAGNOSIS — E785 Hyperlipidemia, unspecified: Secondary | ICD-10-CM | POA: Diagnosis present

## 2014-07-07 DIAGNOSIS — K5900 Constipation, unspecified: Secondary | ICD-10-CM | POA: Diagnosis present

## 2014-07-07 DIAGNOSIS — E1140 Type 2 diabetes mellitus with diabetic neuropathy, unspecified: Secondary | ICD-10-CM

## 2014-07-07 DIAGNOSIS — I10 Essential (primary) hypertension: Secondary | ICD-10-CM

## 2014-07-07 DIAGNOSIS — R471 Dysarthria and anarthria: Secondary | ICD-10-CM | POA: Diagnosis present

## 2014-07-07 DIAGNOSIS — I739 Peripheral vascular disease, unspecified: Secondary | ICD-10-CM | POA: Diagnosis present

## 2014-07-07 DIAGNOSIS — D649 Anemia, unspecified: Secondary | ICD-10-CM | POA: Diagnosis present

## 2014-07-07 DIAGNOSIS — B9620 Unspecified Escherichia coli [E. coli] as the cause of diseases classified elsewhere: Secondary | ICD-10-CM | POA: Diagnosis present

## 2014-07-07 DIAGNOSIS — E1149 Type 2 diabetes mellitus with other diabetic neurological complication: Secondary | ICD-10-CM | POA: Diagnosis present

## 2014-07-07 DIAGNOSIS — R55 Syncope and collapse: Secondary | ICD-10-CM | POA: Diagnosis not present

## 2014-07-07 DIAGNOSIS — I6330 Cerebral infarction due to thrombosis of unspecified cerebral artery: Secondary | ICD-10-CM

## 2014-07-07 DIAGNOSIS — N390 Urinary tract infection, site not specified: Secondary | ICD-10-CM | POA: Diagnosis not present

## 2014-07-07 DIAGNOSIS — I6300 Cerebral infarction due to thrombosis of unspecified precerebral artery: Secondary | ICD-10-CM | POA: Diagnosis present

## 2014-07-07 DIAGNOSIS — Z79899 Other long term (current) drug therapy: Secondary | ICD-10-CM | POA: Diagnosis not present

## 2014-07-07 DIAGNOSIS — I959 Hypotension, unspecified: Secondary | ICD-10-CM | POA: Diagnosis present

## 2014-07-07 DIAGNOSIS — Z794 Long term (current) use of insulin: Secondary | ICD-10-CM | POA: Diagnosis not present

## 2014-07-07 DIAGNOSIS — Z7902 Long term (current) use of antithrombotics/antiplatelets: Secondary | ICD-10-CM | POA: Diagnosis not present

## 2014-07-07 DIAGNOSIS — I639 Cerebral infarction, unspecified: Secondary | ICD-10-CM | POA: Diagnosis present

## 2014-07-07 DIAGNOSIS — E1142 Type 2 diabetes mellitus with diabetic polyneuropathy: Secondary | ICD-10-CM | POA: Diagnosis present

## 2014-07-07 DIAGNOSIS — Z7982 Long term (current) use of aspirin: Secondary | ICD-10-CM | POA: Diagnosis not present

## 2014-07-07 DIAGNOSIS — N179 Acute kidney failure, unspecified: Secondary | ICD-10-CM | POA: Diagnosis present

## 2014-07-07 LAB — HEMOGLOBIN A1C
Hgb A1c MFr Bld: 7.4 % — ABNORMAL HIGH (ref 4.8–5.6)
MEAN PLASMA GLUCOSE: 166 mg/dL

## 2014-07-07 LAB — BASIC METABOLIC PANEL
Anion gap: 5 (ref 5–15)
BUN: 16 mg/dL (ref 6–20)
CO2: 26 mmol/L (ref 22–32)
CREATININE: 1.3 mg/dL — AB (ref 0.61–1.24)
Calcium: 8.7 mg/dL — ABNORMAL LOW (ref 8.9–10.3)
Chloride: 109 mmol/L (ref 101–111)
GFR calc non Af Amer: 54 mL/min — ABNORMAL LOW (ref >60)
Glucose, Bld: 116 mg/dL — ABNORMAL HIGH (ref 65–99)
Potassium: 4.8 mmol/L (ref 3.5–5.1)
Sodium: 140 mmol/L (ref 135–145)

## 2014-07-07 LAB — GLUCOSE, CAPILLARY
GLUCOSE-CAPILLARY: 145 mg/dL — AB (ref 65–99)
GLUCOSE-CAPILLARY: 164 mg/dL — AB (ref 65–99)
Glucose-Capillary: 101 mg/dL — ABNORMAL HIGH (ref 65–99)
Glucose-Capillary: 127 mg/dL — ABNORMAL HIGH (ref 65–99)
Glucose-Capillary: 137 mg/dL — ABNORMAL HIGH (ref 65–99)
Glucose-Capillary: 93 mg/dL (ref 65–99)

## 2014-07-07 LAB — CBC
HCT: 31.5 % — ABNORMAL LOW (ref 39.0–52.0)
Hemoglobin: 10 g/dL — ABNORMAL LOW (ref 13.0–17.0)
MCH: 26.7 pg (ref 26.0–34.0)
MCHC: 31.7 g/dL (ref 30.0–36.0)
MCV: 84.2 fL (ref 78.0–100.0)
Platelets: 343 10*3/uL (ref 150–400)
RBC: 3.74 MIL/uL — AB (ref 4.22–5.81)
RDW: 13.2 % (ref 11.5–15.5)
WBC: 7.3 10*3/uL (ref 4.0–10.5)

## 2014-07-07 MED ORDER — CARVEDILOL 6.25 MG PO TABS
6.2500 mg | ORAL_TABLET | Freq: Two times a day (BID) | ORAL | Status: DC
  Administered 2014-07-07 – 2014-07-10 (×6): 6.25 mg via ORAL
  Filled 2014-07-07 (×6): qty 1

## 2014-07-07 NOTE — Clinical Social Work Note (Signed)
CSW consult acknowledged:   Clinical Education officer, museum received consult for SNF placement. CSW to complete psychosocial assessment with patient and pt's family.  Glendon Axe, MSW, LCSWA [phone] 07/07/2014 10:48 AM

## 2014-07-07 NOTE — Progress Notes (Signed)
[facility] PROGRESS NOTE  Hayden Mckinney [id number] DOB: 1945-12-11 DOA: 07/06/2014  PCP: Blanchie Serve, MD  Brief HPI: 69 year old Caucasian male with a past medical history of diabetes, prior stroke, hypertension, hyperlipidemia, presented from an assisted living facility after a syncopal episode. He did have significant dysarthria from his previous stroke and so history was limited. He was admitted for further evaluation.  Past medical history:  Past Medical History  Diagnosis Date  . Type II diabetes mellitus   . Sarcoma     a. R leg  . Diabetic peripheral neuropathy     a. both feet  . TIA (transient ischemic attack)     a. 08/2010.  Marland Kitchen Hyperlipidemia   . Hypertension   . Stroke     a. 04/2014 multiple bateral infarcts, presumed to be embolic;  b. [id number] Carotid U/S: 1-39% bilat ICA stenosis.  . H/O echocardiogram     a. 04/2014 Echo: EF 55-60%, no rwma.  Marland Kitchen ETOH abuse     a. 04/2014 18-24 beers q weekend.  . CVA (cerebral vascular accident)     Consultants: Neurology  Procedures:  EEG  "Clinical Interpretation: This normal EEG is recorded in the waking and drowsy state. There was no seizure or seizure predisposition recorded on this study."  Antibiotics: None  Subjective: Patient feels well this morning. Denies any complaints. No headache. No chest pain or shortness of breath. No nausea, vomiting.  Objective: Vital Signs  Filed Vitals:   07/07/14 0016 07/07/14 0215 07/07/14 0532 07/07/14 1020  BP: 138/58 165/72 167/68 133/56  Pulse: 74 70 72 71  Temp: 98.4 F (36.9 C) 98.2 F (36.8 C) 98.4 F (36.9 C) 97.5 F (36.4 C)  TempSrc: Oral Oral Oral Oral  Resp: 16 18 20 20   Height:      Weight:      SpO2: 98% 98% 99% 99%    Intake/Output Summary (Last 24 hours) at 07/07/14 1340 Last data filed at 07/07/14 1049  Gross per 24 hour  Intake 1103.33 ml  Output   2380 ml  Net -1276.67 ml   Filed Weights   07/06/14 0926 07/06/14 1117  Weight:  71.2 kg (156 lb 15.5 oz) 70 kg (154 lb 5.2 oz)    General appearance: alert, cooperative, appears stated age and no distress Resp: clear to auscultation bilaterally Cardio: regular rate and rhythm, S1, S2 normal, no murmur, click, rub or gallop GI: soft, non-tender; bowel sounds normal; no masses,  no organomegaly Extremities: extremities normal, atraumatic, no cyanosis or edema Neurologic: Noted to be dysarthric. Tongue is midline. No focal weaknesses noted.  Lab Results:  Basic Metabolic Panel:  Recent Labs Lab 07/06/14 0855 07/06/14 0900 07/07/14 0910  NA 138 138 140  K 4.9 4.9 4.8  CL 111 108 109  CO2 22  --  26  GLUCOSE 161* 158* 116*  BUN 23* 26* 16  CREATININE 1.75* 1.60* 1.30*  CALCIUM 8.6*  --  8.7*   Liver Function Tests:  Recent Labs Lab 07/06/14 0855  AST 12*  ALT 13*  ALKPHOS 68  BILITOT 0.3  PROT 5.8*  ALBUMIN 2.6*   CBC:  Recent Labs Lab 07/06/14 0855 07/06/14 0900 07/07/14 0910  WBC 6.8  --  7.3  NEUTROABS 4.0  --   --   HGB 8.9* 9.2* 10.0*  HCT 27.5* 27.0* 31.5*  MCV 84.4  --  84.2  PLT 317  --  343   CBG:  Recent Labs Lab 07/06/14 2046 07/07/14  0018 07/07/14 0416 07/07/14 0815 07/07/14 1157  GLUCAP 203* 137* 93 101* 145*    Recent Results (from the past 240 hour(s))  Urine culture     Status: None (Preliminary result)   Collection Time: 07/06/14  5:17 PM  Result Value Ref Range Status   Specimen Description URINE, CLEAN CATCH  Final   Special Requests NONE  Final   Culture >=100,000 COLONIES/mL ESCHERICHIA COLI  Final   Report Status PENDING  Incomplete      Studies/Results: Ct Angio Head W/cm &/or Wo Cm  07/06/2014   CLINICAL DATA:  69 year old diabetic hypertensive male with hyperlipidemia and alcohol abuse presenting with slurred speech. History of sarcoma. Prior infarcts. Subsequent encounter.  EXAM: CT ANGIOGRAPHY HEAD AND NECK  TECHNIQUE: Multidetector CT imaging of the head and neck was performed using the  standard protocol during bolus administration of intravenous contrast. Multiplanar CT image reconstructions and MIPs were obtained to evaluate the vascular anatomy. Carotid stenosis measurements (when applicable) are obtained utilizing NASCET criteria, using the distal internal carotid diameter as the denominator.  CONTRAST:  17mL OMNIPAQUE IOHEXOL 350 MG/ML SOLN  COMPARISON:  04/19/2014 CT angiogram.  04/16/2014 MR brain.  FINDINGS: CT HEAD  Brain: No intracranial hemorrhage.  Moderate-size mid to superior right cerebellar infarct with encephalomalacia (noted as acute 04/16/2014).  Bilateral remote basal ganglia and thalamic infarcts  Global atrophy without hydrocephalus.  No intracranial mass or abnormal enhancement.  Calvarium and skull base: Negative.  Paranasal sinuses: Polypoid opacification inferior right maxillary sinus.  Orbits: Negative.  CTA NECK  Aortic arch: 3 vessel aortic arch with mild ectasia of the aortic arch. Ascending thoracic aorta measures up to 3.2 cm.  Right carotid system: Carotid bifurcation plaque. Plaque extends into the proximal 1.5 cm of the right internal carotid artery with 60% diameter stenosis. Right internal carotid artery causes slight impression upon the right posterior pharynx.  Left carotid system: Ectatic left common carotid artery with mild noncalcified plaque. Partially calcified plaque left carotid bifurcation without hemodynamically significant stenosis.  Vertebral arteries:Left vertebral artery is dominant. Moderate stenosis left subclavian artery just proximal to the takeoff of the left vertebral artery. Mild to moderate narrowing proximal left vertebral artery. Moderate to marked narrowing proximal right vertebral artery.  Skeleton: Cervical spondylotic changes most notable C5-6 and C6-7.  Other neck: No worrisome neck mass identified. Underfilling of the right piriform sinus without mass identified.  CTA HEAD  Anterior circulation: Calcified and noncalcified plaque  cavernous segment internal carotid artery bilaterally with moderate narrowing anterior genu of the right internal carotid artery.  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  Moderate narrowing A2 segment branches bilaterally.  Moderate narrowing M2 segment branches of loculated more notable on the right.  Posterior circulation: Non dominant right vertebral artery predominantly ends in a posterior inferior cerebellar artery distribution with only tiny vessel traversing towards the basilar artery. Moderate narrowing of the distal right vertebral artery and right posterior inferior cerebellar artery.  Mild narrowing distal left vertebral artery.  Mild moderate narrowing of portions of the basilar artery.  Moderate tandem stenosis P1 and P2 segment posterior cerebral artery bilaterally.  Moderate narrowing superior cerebellar artery greater on right.  Poor delineation of the right anterior inferior cerebellar artery with narrowing of the left anterior inferior cerebellar artery.  Venous sinuses: Negative  Anatomic variants: As above  Delayed phase: Negative.  IMPRESSION: CT HEAD  No intracranial hemorrhage.  Moderate-size mid to superior right cerebellar infarct with encephalomalacia (noted as acute 04/16/2014).  Bilateral remote basal ganglia and thalamic infarcts.  Small vessel disease type changes without CT evidence of large acute infarct.  Global atrophy without hydrocephalus.  CTA NECK  Plaque proximal 1.5 cm of the right internal carotid artery with 60% diameter stenosis. Right internal carotid artery causes slight impression upon the right posterior pharynx.  Partially calcified plaque left carotid bifurcation without hemodynamically significant stenosis.  Left vertebral artery is dominant. Moderate stenosis left subclavian artery just proximal to the takeoff of the left vertebral artery. Mild to moderate narrowing  proximal left vertebral artery.  Moderate to marked narrowing proximal right vertebral artery.  CTA HEAD  Moderate narrowing anterior genu of the right internal carotid artery.  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  Moderate narrowing A2 segment branches bilaterally.  Moderate narrowing M2 segment branches more notable on the right.  Non dominant right vertebral artery predominantly ends in a posterior inferior cerebellar artery distribution with only tiny vessel traversing towards the basilar artery. Moderate narrowing of the distal right vertebral artery and right posterior inferior cerebellar artery.  Mild narrowing distal left vertebral artery.  Mild to moderate narrowing of portions of the basilar artery.  Moderate tandem stenosis P1 and P2 segment posterior cerebral artery bilaterally.  Moderate narrowing superior cerebellar artery greater on right.  Poor delineation of the right anterior inferior cerebellar artery with narrowing of the left anterior inferior cerebellar artery.  Images reviewed with Etta Quill neurology 74 assistant at the time of imaging.   Electronically Signed   By: Genia Del M.D.   On: 07/06/2014 10:00   X-ray Chest Pa And Lateral  07/06/2014   CLINICAL DATA:  Chest and abdominal pain.  EXAM: CHEST  2 VIEW  COMPARISON:  04/20/2014  FINDINGS: There is cardiomegaly. Lungs are clear. No effusions. No acute bony abnormality.  IMPRESSION: Cardiomegaly.  No active disease.   Electronically Signed   By: Rolm Baptise M.D.   On: 07/06/2014 16:00   Ct Angio Neck W/cm &/or Wo/cm  07/06/2014   CLINICAL DATA:  69 year old diabetic hypertensive male with hyperlipidemia and alcohol abuse presenting with slurred speech. History of sarcoma. Prior infarcts. Subsequent encounter.  EXAM: CT ANGIOGRAPHY HEAD AND NECK  TECHNIQUE: Multidetector CT imaging of the head and neck was performed  using the standard protocol during bolus administration of intravenous contrast. Multiplanar CT image reconstructions and MIPs were obtained to evaluate the vascular anatomy. Carotid stenosis measurements (when applicable) are obtained utilizing NASCET criteria, using the distal internal carotid diameter as the denominator.  CONTRAST:  39mL OMNIPAQUE IOHEXOL 350 MG/ML SOLN  COMPARISON:  04/19/2014 CT angiogram.  04/16/2014 MR brain.  FINDINGS: CT HEAD  Brain: No intracranial hemorrhage.  Moderate-size mid to superior right cerebellar infarct with encephalomalacia (noted as acute 04/16/2014).  Bilateral remote basal ganglia and thalamic infarcts  Global atrophy without hydrocephalus.  No intracranial mass or abnormal enhancement.  Calvarium and skull base: Negative.  Paranasal sinuses: Polypoid opacification inferior right maxillary sinus.  Orbits: Negative.  CTA NECK  Aortic arch: 3 vessel aortic arch with mild ectasia of the aortic arch. Ascending thoracic aorta measures up to 3.2 cm.  Right carotid system: Carotid bifurcation plaque. Plaque extends into the proximal 1.5 cm of the right internal carotid artery with 60% diameter stenosis. Right internal carotid artery causes slight impression upon the right posterior  pharynx.  Left carotid system: Ectatic left common carotid artery with mild noncalcified plaque. Partially calcified plaque left carotid bifurcation without hemodynamically significant stenosis.  Vertebral arteries:Left vertebral artery is dominant. Moderate stenosis left subclavian artery just proximal to the takeoff of the left vertebral artery. Mild to moderate narrowing proximal left vertebral artery. Moderate to marked narrowing proximal right vertebral artery.  Skeleton: Cervical spondylotic changes most notable C5-6 and C6-7.  Other neck: No worrisome neck mass identified. Underfilling of the right piriform sinus without mass identified.  CTA HEAD  Anterior circulation: Calcified and noncalcified  plaque cavernous segment internal carotid artery bilaterally with moderate narrowing anterior genu of the right internal carotid artery.  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  Moderate narrowing A2 segment branches bilaterally.  Moderate narrowing M2 segment branches of loculated more notable on the right.  Posterior circulation: Non dominant right vertebral artery predominantly ends in a posterior inferior cerebellar artery distribution with only tiny vessel traversing towards the basilar artery. Moderate narrowing of the distal right vertebral artery and right posterior inferior cerebellar artery.  Mild narrowing distal left vertebral artery.  Mild moderate narrowing of portions of the basilar artery.  Moderate tandem stenosis P1 and P2 segment posterior cerebral artery bilaterally.  Moderate narrowing superior cerebellar artery greater on right.  Poor delineation of the right anterior inferior cerebellar artery with narrowing of the left anterior inferior cerebellar artery.  Venous sinuses: Negative  Anatomic variants: As above  Delayed phase: Negative.  IMPRESSION: CT HEAD  No intracranial hemorrhage.  Moderate-size mid to superior right cerebellar infarct with encephalomalacia (noted as acute 04/16/2014).  Bilateral remote basal ganglia and thalamic infarcts.  Small vessel disease type changes without CT evidence of large acute infarct.  Global atrophy without hydrocephalus.  CTA NECK  Plaque proximal 1.5 cm of the right internal carotid artery with 60% diameter stenosis. Right internal carotid artery causes slight impression upon the right posterior pharynx.  Partially calcified plaque left carotid bifurcation without hemodynamically significant stenosis.  Left vertebral artery is dominant. Moderate stenosis left subclavian artery just proximal to the takeoff of the left vertebral artery. Mild to moderate  narrowing proximal left vertebral artery.  Moderate to marked narrowing proximal right vertebral artery.  CTA HEAD  Moderate narrowing anterior genu of the right internal carotid artery.  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  Moderate narrowing A2 segment branches bilaterally.  Moderate narrowing M2 segment branches more notable on the right.  Non dominant right vertebral artery predominantly ends in a posterior inferior cerebellar artery distribution with only tiny vessel traversing towards the basilar artery. Moderate narrowing of the distal right vertebral artery and right posterior inferior cerebellar artery.  Mild narrowing distal left vertebral artery.  Mild to moderate narrowing of portions of the basilar artery.  Moderate tandem stenosis P1 and P2 segment posterior cerebral artery bilaterally.  Moderate narrowing superior cerebellar artery greater on right.  Poor delineation of the right anterior inferior cerebellar artery with narrowing of the left anterior inferior cerebellar artery.  Images reviewed with Etta Quill neurology 24 assistant at the time of imaging.   Electronically Signed   By: Genia Del M.D.   On: 07/06/2014 10:00   [name] Contrast  07/06/2014   CLINICAL DATA:  Syncopal episode. Patient developed slowed speech and became unresponsive with hypotension at  that time.  EXAM: MRI HEAD WITHOUT CONTRAST  TECHNIQUE: Multiplanar, multiecho pulse sequences of the brain and surrounding structures were obtained without intravenous contrast.  COMPARISON:  Head CTA 07/06/2014 and MRI 04/16/2014  FINDINGS: There is a 3 mm acute infarct along the lateral margin of the body of the right lateral ventricle. A chronic infarct is again seen involving the right basal ganglia and right corona radiata more posteriorly with a small amount of increased diffusion weighted signal again seen along  its margins, similar to the prior study and without definite evidence of superimposed acute ischemia. A small amount of chronic blood products are again seen associated with the old right basal ganglia infarct, and chronic blood products are also again noted associated with a chronic left basal ganglia infarct. Chronic lacunar infarcts are again noted in the thalami.  Subacute superior right cerebellar infarct is noted, acute on the prior MRI. Patchy T2 hyperintensities in the cerebral white matter bilaterally are similar to the prior MRI and compatible with moderate chronic small vessel ischemic disease. There is mild to moderate cerebral atrophy. There is no evidence of mass, midline shift, or extra-axial fluid collection.  Orbits are unremarkable. Right maxillary sinus mucous retention cyst is noted. No significant mastoid fluid. Major intracranial arterial flow voids are preserved. Right vertebral artery is again noted to be hypoplastic. Abnormal left transverse sinus, left sigmoid sinus, and left jugular bulb flow voids are unchanged from the prior MRI and may reflect slow flow as there was no evidence of venous sinus thrombosis on CTA earlier today.  IMPRESSION: 1. Punctate acute infarct adjacent to the right lateral ventricle. 2. Chronic bilateral basal ganglia and the laminae ache infarcts and subacute right cerebellar infarct. 3. Moderate chronic small vessel ischemic disease.   Electronically Signed   By: Logan Bores   On: 07/06/2014 13:31   Dg Abd 2 Views  07/06/2014   CLINICAL DATA:  Chest and abdominal pain.  EXAM: ABDOMEN - 2 VIEW  COMPARISON:  None.  FINDINGS: Nonobstructive bowel gas pattern. No free air. No organomegaly. Contrast material is noted in the bladder. No acute bony abnormality. No suspicious calcification visualized.  IMPRESSION: Negative.   Electronically Signed   By: Rolm Baptise M.D.   On: 07/06/2014 16:01    Medications:  Scheduled: . aspirin  300 mg Rectal Daily  .  clopidogrel  75 mg Oral Daily  . docusate sodium  100 mg Oral BID  . famotidine  20 mg Oral Daily  . heparin  5,000 Units Subcutaneous 3 times per day  . insulin aspart  0-9 Units Subcutaneous 6 times per day  . sodium chloride  3 mL Intravenous Q12H   Continuous:  [id number] **OR** acetaminophen, alum & mag hydroxide-simeth, bisacodyl, hydrALAZINE, morphine injection, ondansetron **OR** ondansetron (ZOFRAN) IV, senna-docusate  Assessment/Plan:  Principal Problem:   Syncopal episodes Active Problems:   Type II diabetes mellitus with neurological manifestations   HTN (hypertension)   Hyperlipidemia   Left sided abdominal pain   Syncope and collapse   Cerebral thrombosis with cerebral infarction    Syncopal episode Etiology unclear. Patient was noted to be hypotensive. Per EMS reports. Creatinine was also elevated. He could have been dehydrated. MRI brain did show small acute infarct. Seen by physical and occupational therapy and they do recommend skilled nursing facility placement.  Acute stroke in the setting of recent stroke Acute infarct noted near the right lateral ventricle. Unlikely to have caused his above presentation. Discussed with Dr. Leonie Man  with neurology. Considering his recent stroke and workup during that time there is no need for further investigations. No change to his antiplatelet therapy. He is also on statin which will be continued. Patient has dysarthria from his previous stroke.  Hypotension in a patient with essential hypertension This was transient. Could've contributed to his syncope. Her pressures have been stable here in the hospital and sometimes in the hypertensive range. His anti-hypertensive agents were held at the time of admission. We will resume his carvedilol at a lower dose. Continue to hold his nitrates and hydralazine.  Left lower quadrant abdominal pain Abdominal x-ray was unremarkable. He is no longer tender. Likely secondary to  constipation. Continue with stool softeners and suppositories.  Diabetes mellitus type 2 Continue with sliding scale coverage. His glimepiride is being held.   DVT Prophylaxis: Subcutaneous heparin    Code Status: Full code  Family Communication: No family at bedside  Disposition Plan: Await placement.    LOS: 1 day   [facility] Pager [phone] 07/07/2014, 1:40 PM  If 7PM-7AM, please contact night-coverage at [email or web address], password [facility]

## 2014-07-07 NOTE — Evaluation (Addendum)
Physical Therapy Evaluation Patient Details Name: Hayden Mckinney MRN: [id number] DOB: 10-11-1945 Today's Date: 07/07/2014   History of Present Illness  69 y.o. male hx of recent stroke admission, DM, HTN, HLD presenting with acute onset of altered mental status. Per EMS he was sitting at breakfast when he developed slurred speech and then became unresponsive. MRI shows acute infarct adjacent to the right lateral ventricle.   Clinical Impression  Patient demonstrates deficits in functional mobility as indicated below. Will benefit from continued skilled PT to address deficits and maximize function. Will see as indicated and progress as tolerated. May benefit from SNF for continued stroke recovery vs ALF with physical assist.    Follow Up Recommendations SNF     Equipment Recommendations       Recommendations for Other Services       Precautions / Restrictions Precautions Precautions: Fall Restrictions Weight Bearing Restrictions: No      Mobility  Bed Mobility Overal bed mobility: Modified Independent             General bed mobility comments: increased time to perform, reliance on rails for assist to EOB  Transfers Overall transfer level: Needs assistance Equipment used: 2 person hand held assist Transfers: Sit to/from Stand Sit to Stand: Min assist;From elevated surface         General transfer comment: Min assist for stability in standing  Ambulation/Gait Ambulation/Gait assistance: Mod assist Ambulation Distance (Feet): 18 Feet Assistive device: 2 person hand held assist Gait Pattern/deviations: Decreased stride length;Ataxic;Step-through pattern;Staggering left;Staggering right;Narrow base of support;Scissoring Gait velocity: decreased Gait velocity interpretation: <1.8 ft/sec, indicative of risk for recurrent falls General Gait Details: significant ataxia with gait, poorly coordinated step, scissoring at times, reliance on hand held assist and wrap  around support during ambulation.   Stairs            Wheelchair Mobility    Modified Rankin (Stroke Patients Only)       Balance Overall balance assessment: Needs assistance Sitting-balance support: Feet supported Sitting balance-Leahy Scale: Good Sitting balance - Comments: no physical assist to maintain sitting balance   Standing balance support: Bilateral upper extremity supported;During functional activity Standing balance-Leahy Scale: Poor                               Pertinent Vitals/Pain Pain Assessment: No/denies pain    Home Living Family/patient expects to be discharged to:: Skilled nursing facility (for rehabilitation post prior CVA)                      Prior Function Level of Independence: Needs assistance   Gait / Transfers Assistance Needed: ambulting with RW and assist     Comments: patient was at SNF for rehabilitation follow recent CVA (had went to CIR, then transitioned to [facility] SNF for further rehabilitation, now residing at ALF per LCSW)     Hand Dominance   Dominant Hand: Right    Extremity/Trunk Assessment               Lower Extremity Assessment: Generalized weakness;RLE deficits/detail;LLE deficits/detail   LLE Deficits / Details: LLE weakness upon assessment slightly weaker than RLE     Communication   Communication: Expressive difficulties  Cognition Arousal/Alertness: Awake/alert Behavior During Therapy: WFL for tasks assessed/performed Overall Cognitive Status: Within Functional Limits for tasks assessed (unsure of progression of cognitive deficits from prior CVA)  General Comments      Exercises  Educated on AROM bilateral LEs      Assessment/Plan    PT Assessment Patient needs continued PT services  PT Diagnosis Difficulty walking;Abnormality of gait;Generalized weakness   PT Problem List Decreased strength;Decreased activity tolerance;Decreased  balance;Decreased mobility;Decreased coordination  PT Treatment Interventions DME instruction;Gait training;Functional mobility training;Therapeutic activities;Therapeutic exercise;Balance training;Patient/family education   PT Goals (Current goals can be found in the Care Plan section) Acute Rehab PT Goals Patient Stated Goal: to be able to walk again PT Goal Formulation: With patient Time For Goal Achievement: 07/21/14 Potential to Achieve Goals: Good    Frequency Min 3X/week   Barriers to discharge        Co-evaluation               End of Session Equipment Utilized During Treatment: Gait belt;Oxygen Activity Tolerance: Patient tolerated treatment well Patient left: in chair;with call bell/phone within reach;with chair alarm set Nurse Communication: Mobility status;Precautions    Functional Assessment Tool Used: clinical judgement Functional Limitation: Mobility: Walking and moving around Mobility: Walking and Moving Around Current Status [id number]): At least 40 percent but less than 60 percent impaired, limited or restricted Mobility: Walking and Moving Around Goal Status [phone]): At least 1 percent but less than 20 percent impaired, limited or restricted    Time: 0841-0901 PT Time Calculation (min) (ACUTE ONLY): 20 min   Charges:   PT Evaluation $Initial PT Evaluation Tier I: 1 Procedure     PT G Codes:   PT G-Codes **NOT FOR INPATIENT CLASS** Functional Assessment Tool Used: clinical judgement Functional Limitation: Mobility: Walking and moving around Mobility: Walking and Moving Around Current Status [id number]): At least 40 percent but less than 60 percent impaired, limited or restricted Mobility: Walking and Moving Around Goal Status [phone]): At least 1 percent but less than 20 percent impaired, limited or restricted    Duncan Dull 07/07/2014, 10:32 AM  Alben Deeds, PT DPT  [phone]

## 2014-07-07 NOTE — Progress Notes (Signed)
STROKE TEAM PROGRESS NOTE   SUBJECTIVE (INTERVAL HISTORY) .69 year old male with history of recent stroke presented with acute onset dysarthria and altered mental status in the setting of hypotension documented by EMS with NIH stroke scale of 6  No famiy is at the bedside.  Overall he feels his condition is stable.    OBJECTIVE Temp:  [97.4 F (36.3 C)-98.4 F (36.9 C)] 98.4 F (36.9 C) (06/29 0532) Pulse Rate:  [58-90] 72 (06/29 0532) Cardiac Rhythm:  [-] Normal sinus rhythm (06/28 2034) Resp:  [10-20] 20 (06/29 0532) BP: (102-170)/(50-84) 167/68 mmHg (06/29 0532) SpO2:  [98 %-100 %] 99 % (06/29 0532) Weight:  [70 kg (154 lb 5.2 oz)-71.2 kg (156 lb 15.5 oz)] 70 kg (154 lb 5.2 oz) (06/28 1117)   Recent Labs Lab 07/06/14 1613 07/06/14 2046 07/07/14 0018 07/07/14 0416 07/07/14 0815  GLUCAP 83 203* 137* 93 101*    Recent Labs Lab 07/06/14 0855 07/06/14 0900  NA 138 138  K 4.9 4.9  CL 111 108  CO2 22  --   GLUCOSE 161* 158*  BUN 23* 26*  CREATININE 1.75* 1.60*  CALCIUM 8.6*  --     Recent Labs Lab 07/06/14 0855  AST 12*  ALT 13*  ALKPHOS 68  BILITOT 0.3  PROT 5.8*  ALBUMIN 2.6*    Recent Labs Lab 07/06/14 0855 07/06/14 0900  WBC 6.8  --   NEUTROABS 4.0  --   HGB 8.9* 9.2*  HCT 27.5* 27.0*  MCV 84.4  --   PLT 317  --    No results for input(s): CKTOTAL, CKMB, CKMBINDEX, TROPONINI in the last 168 hours.  Recent Labs  07/06/14 0855  LABPROT 14.3  INR 1.09   No results for input(s): COLORURINE, LABSPEC, PHURINE, GLUCOSEU, HGBUR, BILIRUBINUR, KETONESUR, PROTEINUR, UROBILINOGEN, NITRITE, LEUKOCYTESUR in the last 72 hours.  Invalid input(s): APPERANCEUR     Component Value Date/Time   CHOL 254* 04/17/2014 1543   TRIG 276* 04/17/2014 1543   HDL 37* 04/17/2014 1543   CHOLHDL 6.9 04/17/2014 1543   VLDL 55* 04/17/2014 1543   LDLCALC 162* 04/17/2014 1543   Lab Results  Component Value Date   HGBA1C 7.4* 07/06/2014   No results found for:  LABOPIA, [place], [place], [place], [facility], [place]   Recent Labs Lab 07/06/14 [place] <5    CT HEAD   07/06/2014   No intracranial hemorrhage.  Moderate-size mid to superior right cerebellar infarct with encephalomalacia (noted as acute 04/16/2014).  Bilateral remote basal ganglia and thalamic infarcts.  Small vessel disease type changes without CT evidence of large acute infarct.  Global atrophy without hydrocephalus.    CTA NECK   07/06/2014   Plaque proximal 1.5 cm of the right internal carotid artery with 60% diameter stenosis. Right internal carotid artery causes slight impression upon the right posterior pharynx.  Partially calcified plaque left carotid bifurcation without hemodynamically significant stenosis.  Left vertebral artery is dominant. Moderate stenosis left subclavian artery just proximal to the takeoff of the left vertebral artery. Mild to moderate narrowing proximal left vertebral artery.  Moderate to marked narrowing proximal right vertebral artery.    CTA HEAD   07/06/2014   Moderate narrowing anterior genu of the right internal carotid artery.  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  Moderate narrowing A2 segment branches bilaterally.  Moderate narrowing M2 segment branches more notable on the right.  Non dominant right  vertebral artery predominantly ends in a posterior inferior cerebellar artery distribution with only tiny vessel traversing towards the basilar artery. Moderate narrowing of the distal right vertebral artery and right posterior inferior cerebellar artery.  Mild narrowing distal left vertebral artery.  Mild to moderate narrowing of portions of the basilar artery.  Moderate tandem stenosis P1 and P2 segment posterior cerebral artery bilaterally.  Moderate narrowing superior cerebellar artery greater on right.  Poor delineation of the right anterior inferior  cerebellar artery with narrowing of the left anterior inferior cerebellar artery.    [name] Contrast 07/06/2014    1. Punctate acute infarct adjacent to the right lateral ventricle. 2. Chronic bilateral basal ganglia and the laminae ache infarcts and subacute right cerebellar infarct. 3. Moderate chronic small vessel ischemic disease.     Dg Abd 2 Views 07/06/2014    Negative.     X-ray Chest Pa And Lateral 07/06/2014   Cardiomegaly.  No active disease.     EEG This normal EEG is recorded in the waking and drowsy state. There was no seizure or seizure predisposition recorded on this study.    PHYSICAL EXAM Pleasant middle-age Caucasian male currently not in distress. . Afebrile. Head is nontraumatic. Neck is supple without bruit.    Cardiac exam no murmur or gallop. Lungs are clear to auscultation. Distal pulses are well felt. Neurological Exam : Awake and alert disoriented. Speech is dysarthric and at times rambling and difficult to understand. Follows commands well. Fundi were not visualized. Extraocular movements are full range without nystagmus. Blinks to threat bilaterally. Right lower facial weakness. Tongue midline. Jaw jerk is brisk. Motor system exam reveals no upper or lower extremity drift. Mild weakness of right grip and intrinsic hand muscles. Deep tendon reflexes are 1+ symmetric. Patient has some hyperesthesia to touch and pinprick in both feet likely from underlying peripheral neuropathy. Ankle jerks are depressed. Plantars are downgoing. ASSESSMENT/PLAN [name] is a 69 y.o. male with history of recent stroke, DM, HTN, HLD presenting with acute onset of altered mental status. He did not receive IV t-PA due to stroke within past 90 days.   Stroke:  punctate right lateral periventricular infarct secondary to small vessel disease    MRI  punctate right lateral ventrical infarct  MRA  No significant large vessel stenosis, there is wide spread intracranial  atherosclerosis  ILR placed 04/2014. No indication to urgently interrogate as current stroke is not felt to be embolic.  EEG pending   HgbA1c 7.4  Heparin 5000 units sq tid for VTE prophylaxis  DIET DYS 3 Room service appropriate?: Yes; Fluid consistency:: Thin  aspirin 81 mg orally every day and clopidogrel 75 mg orally every day prior to admission, now on clopidogrel 75 mg orally every day and aspirin 300 mg suppository daily  Patient counseled to be compliant with his antithrombotic medications  Ongoing aggressive stroke risk factor management with BP managment  Therapy recommendations:  SNF  Disposition:  pending   Hypotension, w/ hx Hypertension  Per medicine - determine etiology of hypotension and treatment course  Hyperlipidemia  Home meds:  lipitor 40mg , resumed in hospital  LDL not rechecked   Must continue statin at discharge  Diabetes type II  HgbA1c 7.4, goal < 7.0  Uncontrolled  Other Stroke Risk Factors  Advanced age  ETOH use 04/2014 18-24 beers q weekend.  Hx stroke/TIA - 04/2014 multiple bateral infarcts, presumed to be embolic  Other Active Problems  LLQ abd pain  Normocytic anemia  Hospital day # [place] for Pager information 07/07/2014 10:24 AM  I have personally examined this patient, reviewed notes, independently viewed imaging studies, participated in medical decision making and plan of care. I have made any additions or clarifications directly to the above note. Agree with note above. He presented with dysarthria and altered mental status, small subcortical lacunar infarcts from small vessel disease in the setting of hypotension. He remains at risk for neurological worsening, recurrent stroke, TIA and needs ongoing stroke evaluation. Recommend avoid hypotension and had just blood pressure medications and maintain good hydration status. Discussed with Dr. Margaretha Glassing, MD Medical  Director [place] Pager: [phone] 07/07/2014 2:05 PM    To contact Stroke Continuity provider, please refer to [email or web address] After hours, contact General Neurology

## 2014-07-07 NOTE — Progress Notes (Signed)
Speech Language Pathology Treatment: Dysphagia  Patient Details Name: Hayden Mckinney MRN: [id number] DOB: 17-Nov-1945 Today's Date: 07/07/2014 Time: 1660-6004 SLP Time Calculation (min) (ACUTE ONLY): 9 min  Assessment / Plan / Recommendation Clinical Impression  Pt consumed regular textures and thin liquids with prolonged bolus formation due to lack of dentition. He utilized safe swallowing strategies with Mod I and did not shows signs of aspiration. SLP to s/o at this time.   HPI Other Pertinent Information: Hayden Mckinney is an 69 y.o. male hx of recent stroke admission, DM, HTN, HLD presenting with acute onset of altered mental status. Per EMS he was sitting at breakfast when he developed slurred speech and then became unresponsive. MRI shows acute infarct adjacent to the right lateral ventricle. Pt subjectively reports that his symptoms have resolved.   Pertinent Vitals Pain Assessment: No/denies pain Faces Pain Scale: Hurts little more Pain Location: R arm at IV site Pain Descriptors / Indicators: Constant Pain Intervention(s): Repositioned;Other (comment) (staff made aware)  SLP Plan  All goals met    Recommendations Diet recommendations: Dysphagia 3 (mechanical soft);Thin liquid Liquids provided via: Cup;Straw Medication Administration: Whole meds with puree Supervision: Patient able to self feed;Intermittent supervision to cue for compensatory strategies Compensations: Slow rate;Small sips/bites Postural Changes and/or Swallow Maneuvers: Seated upright 90 degrees       Oral Care Recommendations: Oral care BID Follow up Recommendations: None Plan: All goals met     Hayden Mckinney, M.A. CCC-SLP [phone]  Hayden Mckinney 07/07/2014, 2:13 PM

## 2014-07-07 NOTE — Evaluation (Addendum)
Occupational Therapy Evaluation Patient Details Name: Hayden Mckinney MRN: [id number] DOB: [birth year] Today's Date: 07/07/2014    History of Present Illness 69 y.o. male hx of recent stroke admission, DM, HTN, HLD presenting with acute onset of altered mental status. Per EMS he was sitting at breakfast when he developed slurred speech and then became unresponsive. MRI shows acute infarct adjacent to the right lateral ventricle.    Clinical Impression   PT admitted with R lateral ventricle infarct on MRI. Pt currently with functional limitiations due to the deficits listed below (see OT problem list). PTA living at [facility] with (A) for transfers using a w/c.  Pt will benefit from skilled OT to increase their independence and safety with adls and balance to allow discharge back to ALF if facility willing to accept current level of care. Pt demonstrates same level described by patient PTA ( stand pivot to chair, ambulate 1 person (A) to shower, static sitting for hygiene in shower / sink) If facility is unable to provide care please follow up at SNF level care. Pt demonstrates balance deficits and could benefit from further OT upon d/c.     Follow Up Recommendations  SNF (return to ALF at current level )    Equipment Recommendations  None recommended by OT    Recommendations for Other Services       Precautions / Restrictions Precautions Precautions: Fall Restrictions Weight Bearing Restrictions: No      Mobility Bed Mobility Overal bed mobility: Modified Independent             General bed mobility comments: sit<>Supine  Transfers Overall transfer level: Needs assistance Equipment used: 1 person hand held assist Transfers: Sit to/from Stand Sit to Stand: Min assist         General transfer comment: pushing up with L UE    Balance Overall balance assessment: Needs assistance Sitting-balance support: Feet supported;No upper extremity supported Sitting  balance-Leahy Scale: Good Sitting balance - Comments: no physical assist to maintain sitting balance   Standing balance support: During functional activity;Single extremity supported Standing balance-Leahy Scale: Fair                              ADL Overall ADL's : Needs assistance/impaired     Grooming: Wash/dry face;Brushing hair;Set up;Sitting Grooming Details (indicate cue type and reason): pt asked to perform adl at sink level and was able to carry out task. pt needed extended time to ring out wash cloth due to not using R UE due to pain at elbow IV.   pt completed shower transfer onto shower bench min guard (A)  ( BSC to bench seat min guard)               Toilet Transfer: Minimal assistance;Ambulation;BSC Toilet Transfer Details (indicate cue type and reason): pt noted to have ataxic scissor gait to transfer to [facility]         Functional mobility during ADLs: Minimal assistance General ADL Comments: Pt with R UE on IV pole to keep elbow extended and decr pt pain at IV site. pt with hand held (A) on LT side. pt noted to have scissor gait during transfers. pt reaching for environemental supports during transfer. pT verbalized using w/c PTA at ALF     Vision Vision Assessment?: No apparent visual deficits   Perception     Praxis      Pertinent Vitals/Pain Pain Assessment: Faces Faces  Pain Scale: Hurts little more Pain Location: R arm at IV site Pain Descriptors / Indicators: Constant Pain Intervention(s): Repositioned;Other (comment) (staff made aware)     Hand Dominance Right   Extremity/Trunk Assessment Upper Extremity Assessment Upper Extremity Assessment: RUE deficits/detail;LUE deficits/detail RUE Deficits / Details: keeping R arm in extension due to IV site and pain LUE Deficits / Details: generalized weakness    Lower Extremity Assessment Lower Extremity Assessment: Defer to PT evaluation;RLE deficits/detail RLE Deficits / Details: noted to  drag R foot ( foot droop look) with ambulation to restroom with OT during eval Cervical / Trunk Assessment Cervical / Trunk Assessment: Normal   Communication Communication Communication: Expressive difficulties   Cognition Arousal/Alertness: Awake/alert Behavior During Therapy: WFL for tasks assessed/performed Overall Cognitive Status: Within Functional Limits for tasks assessed                     General Comments       Exercises       Shoulder Instructions      Home Living Family/patient expects to be discharged to:: Assisted living                                 Additional Comments: Pt reports being at ALF using wc for transfers and completing bath seated in a shower supervision transfers      Prior Functioning/Environment Level of Independence: Needs assistance  Gait / Transfers Assistance Needed: per patient used a w/c to transfer to dining room and shower then would ambulate with (A) to seat within shower or dining hall at table ADL's / Homemaking Assistance Needed: pt reports (A) only for transfers and completing the adl mod I   Comments: Pt after recent CVA  Transitioned to CIR then to SNF and now at ALF [place]    OT Diagnosis: Generalized weakness;Ataxia   OT Problem List: Decreased strength;Decreased activity tolerance;Impaired balance (sitting and/or standing);Decreased safety awareness;Decreased knowledge of use of DME or AE;Decreased knowledge of precautions   OT Treatment/Interventions: Self-care/ADL training;Therapeutic exercise;Neuromuscular education;DME and/or AE instruction;Therapeutic activities;Patient/family education;Balance training    OT Goals(Current goals can be found in the care plan section) Acute Rehab OT Goals Patient Stated Goal: to get back to being allowed to walk by myself OT Goal Formulation: With patient Time For Goal Achievement: 07/21/14 Potential to Achieve Goals: Good  OT Frequency: Min 2X/week    Barriers to D/C:            Co-evaluation              End of Session Equipment Utilized During Treatment: Gait belt Nurse Communication: Mobility status;Precautions  Activity Tolerance: Patient tolerated treatment well Patient left: in bed;with call bell/phone within reach;with bed alarm set   Time: [id number] OT Time Calculation (min): 18 min Charges:  OT General Charges $OT Visit: 1 Procedure OT Evaluation $Initial OT Evaluation Tier I: 1 Procedure G-Codes:    Peri Maris Jul 13, 2014, 11:23 AM  Pager: [phone]

## 2014-07-08 DIAGNOSIS — N390 Urinary tract infection, site not specified: Secondary | ICD-10-CM

## 2014-07-08 LAB — URINALYSIS, ROUTINE W REFLEX MICROSCOPIC
Bilirubin Urine: NEGATIVE
Glucose, UA: NEGATIVE mg/dL
Ketones, ur: NEGATIVE mg/dL
Nitrite: NEGATIVE
Protein, ur: 30 mg/dL — AB
Specific Gravity, Urine: 1.01 (ref 1.005–1.030)
Urobilinogen, UA: 0.2 mg/dL (ref 0.0–1.0)
pH: 5.5 (ref 5.0–8.0)

## 2014-07-08 LAB — GLUCOSE, CAPILLARY
Glucose-Capillary: 132 mg/dL — ABNORMAL HIGH (ref 65–99)
Glucose-Capillary: 136 mg/dL — ABNORMAL HIGH (ref 65–99)
Glucose-Capillary: 145 mg/dL — ABNORMAL HIGH (ref 65–99)
Glucose-Capillary: 85 mg/dL (ref 65–99)
Glucose-Capillary: 96 mg/dL (ref 65–99)
Glucose-Capillary: 99 mg/dL (ref 65–99)

## 2014-07-08 LAB — URINE CULTURE: Culture: >=100000

## 2014-07-08 LAB — URINE MICROSCOPIC-ADD ON

## 2014-07-08 MED ORDER — SODIUM CHLORIDE 0.9 % IV SOLN
500.0000 mg | Freq: Three times a day (TID) | INTRAVENOUS | Status: DC
  Administered 2014-07-08 – 2014-07-09 (×4): 500 mg via INTRAVENOUS
  Filled 2014-07-08 (×5): qty 500

## 2014-07-08 MED ORDER — CEFPODOXIME PROXETIL 200 MG PO TABS
200.0000 mg | ORAL_TABLET | Freq: Two times a day (BID) | ORAL | Status: DC
  Administered 2014-07-08: 200 mg via ORAL
  Filled 2014-07-08 (×2): qty 1

## 2014-07-08 MED ORDER — ISOSORB DINITRATE-HYDRALAZINE 20-37.5 MG PO TABS
1.0000 | ORAL_TABLET | Freq: Two times a day (BID) | ORAL | Status: DC
  Administered 2014-07-08 – 2014-07-10 (×5): 1 via ORAL
  Filled 2014-07-08 (×6): qty 1

## 2014-07-08 MED ORDER — ASPIRIN 325 MG PO TABS
325.0000 mg | ORAL_TABLET | Freq: Every day | ORAL | Status: DC
  Administered 2014-07-08 – 2014-07-10 (×3): 325 mg via ORAL
  Filled 2014-07-08 (×3): qty 1

## 2014-07-08 MED ORDER — INSULIN ASPART 100 UNIT/ML ~~LOC~~ SOLN
0.0000 [IU] | Freq: Three times a day (TID) | SUBCUTANEOUS | Status: DC
  Administered 2014-07-08: 1 [IU] via SUBCUTANEOUS
  Administered 2014-07-09 (×2): 2 [IU] via SUBCUTANEOUS
  Administered 2014-07-09: 1 [IU] via SUBCUTANEOUS
  Administered 2014-07-10: 2 [IU] via SUBCUTANEOUS

## 2014-07-08 NOTE — Clinical Social Work Note (Signed)
Clinical Social Worker informed that patient is slightly confused. CSW contacted patient's sister Serita Sheller and left a voice message in reference to post-acute placement for SNF. CSW awaiting a returned phone call.   CSW remains available.   Glendon Axe, MSW, LCSWA [phone] 07/08/2014 2:42 PM

## 2014-07-08 NOTE — Progress Notes (Signed)
ANTIBIOTIC CONSULT NOTE - INITIAL  Pharmacy Consult for primaxin Indication: ESBL UTI  No Known Allergies  Patient Measurements: Height: 5\' 11"  (180.3 cm) Weight: 154 lb 5.2 oz (70 kg) IBW/kg (Calculated) : 75.3 Adjusted Body Weight:   Vital Signs: Temp: 98.4 F (36.9 C) (06/30 0957) Temp Source: Oral (06/30 0957) BP: 172/76 mmHg (06/30 0957) Pulse Rate: 66 (06/30 0957) Intake/Output from previous day: 06/29 0701 - 06/30 0700 In: -  Out: 1350 [Urine:1350] Intake/Output from this shift:    Labs:  Recent Labs  07/06/14 0855 07/06/14 0900 07/07/14 0910  WBC 6.8  --  7.3  HGB 8.9* 9.2* 10.0*  PLT 317  --  343  CREATININE 1.75* 1.60* 1.30*   Estimated Creatinine Clearance: 53.1 mL/min (by C-G formula based on Cr of 1.3). No results for input(s): VANCOTROUGH, VANCOPEAK, VANCORANDOM, GENTTROUGH, GENTPEAK, GENTRANDOM, TOBRATROUGH, TOBRAPEAK, TOBRARND, AMIKACINPEAK, AMIKACINTROU, AMIKACIN in the last 72 hours.   Microbiology: Recent Results (from the past 720 hour(s))  Urine culture     Status: None   Collection Time: 07/06/14  5:17 PM  Result Value Ref Range Status   Specimen Description URINE, CLEAN CATCH  Final   Special Requests NONE  Final   Culture   Final    >=100,000 COLONIES/mL ESCHERICHIA COLI Confirmed Extended Spectrum Beta-Lactamase Producer (ESBL)    Report Status 07/08/2014 FINAL  Final   Organism ID, Bacteria ESCHERICHIA COLI  Final      Susceptibility   Escherichia coli - MIC*    AMPICILLIN >=32 RESISTANT Resistant     CEFAZOLIN >=64 RESISTANT Resistant     CEFTRIAXONE >=64 RESISTANT Resistant     CIPROFLOXACIN >=4 RESISTANT Resistant     GENTAMICIN >=16 RESISTANT Resistant     IMIPENEM <=0.25 SENSITIVE Sensitive     NITROFURANTOIN <=16 SENSITIVE Sensitive     TRIMETH/SULFA <=20 SENSITIVE Sensitive     AMPICILLIN/SULBACTAM >=32 RESISTANT Resistant     PIP/TAZO 8 SENSITIVE Sensitive     * >=100,000 COLONIES/mL ESCHERICHIA COLI    Medical  History: Past Medical History  Diagnosis Date  . Type II diabetes mellitus   . Sarcoma     a. R leg  . Diabetic peripheral neuropathy     a. both feet  . TIA (transient ischemic attack)     a. 08/2010.  Marland Kitchen Hyperlipidemia   . Hypertension   . Stroke     a. 04/2014 multiple bateral infarcts, presumed to be embolic;  b. [id number] Carotid U/S: 1-39% bilat ICA stenosis.  . H/O echocardiogram     a. 04/2014 Echo: EF 55-60%, no rwma.  Marland Kitchen ETOH abuse     a. 04/2014 18-24 beers q weekend.  . CVA (cerebral vascular accident)     Medications:  Anti-infectives    Start     Dose/Rate Route Frequency Ordered Stop   07/08/14 1200  imipenem-cilastatin (PRIMAXIN) 500 mg in sodium chloride 0.9 % 100 mL IVPB     500 mg 200 mL/hr over 30 Minutes Intravenous Every 8 hours 07/08/14 1119     07/08/14 1000  cefpodoxime (VANTIN) tablet 200 mg  Status:  Discontinued     200 mg Oral Every 12 hours 07/08/14 0847 07/08/14 1117     Assessment: 8 yom initially presented on 6/28 as a code stroke from SNF. Now growing ESBL Ecoli in his urine culture. Previously on cefpodoxime now to change to primaxin. Pt is afebrile and WBC is WNL. Scr has improved and is now 1.3 today.   Primaxin  6/30>> Cefpodoxime x 1 6/30  6/28 Urine - Ecoli (ESBL)  Goal of Therapy:  Eradication of infection  Plan:  - Primaxin 500mg  IV Q8H - F/u renal fxn, C&S, clinical status   Chau Sawin, Rande Lawman 07/08/2014,11:19 AM

## 2014-07-08 NOTE — Care Management (Signed)
Important Message  Patient Details  Name: Hayden Mckinney MRN: [id number] Date of Birth: 1945-12-07   Medicare Important Message Given:  Yes-second notification given    Delorse Lek 07/08/2014, 10:11 AM

## 2014-07-08 NOTE — Clinical Documentation Improvement (Addendum)
69 year old white male admitted with syncope, hypotension, and new stroke.  Syncope not felt to be 2/2 to the new stroke per progress note.   Patient has received IV fluids this admission.   BUN/Creat this admission noted below.  Creatinine has improved by 0.45 mg/dL in 24 hours.  Component     Latest Ref Rng 07/06/2014 07/07/2014  BUN     6 - 20 mg/dL 23 (H) 16  Creatinine     0.61 - 1.24 mg/dL 1.75 (H) 1.30 (H)  EGFR (Non-African Amer.)     >60 mL/min 38 (L) 54 (L)   Please document if a condition below provides greater specificity regarding the patient's renal function this admission:   - Acute Kidney Injury 2/2 .....................  - Other condition  - Unable to clinically determine  Thank You, Erling Conte ,RN Clinical Documentation Specialist:  [phone] [place] [employer] [facility]

## 2014-07-08 NOTE — Progress Notes (Signed)
Physical Therapy Treatment Patient Details Name: Hayden Mckinney MRN: [id number] DOB: 25-Apr-1945 Today's Date: 07/08/2014    History of Present Illness 69 y.o. male hx of recent stroke admission, DM, HTN, HLD presenting with acute onset of altered mental status. Per EMS he was sitting at breakfast when he developed slurred speech and then became unresponsive. MRI shows acute infarct adjacent to the right lateral ventricle.     PT Comments    Patient making slow improvements with mobility and gait.  Agree with need for SNF at discharge for continued therapy.  Follow Up Recommendations  SNF;Supervision/Assistance - 24 hour     Equipment Recommendations  None recommended by PT    Recommendations for Other Services       Precautions / Restrictions Precautions Precautions: Fall Restrictions Weight Bearing Restrictions: No    Mobility  Bed Mobility Overal bed mobility: Modified Independent             General bed mobility comments: Increased time to perform bed mobility.  Transfers Overall transfer level: Needs assistance Equipment used: Rolling walker (2 wheeled) Transfers: Sit to/from Stand Sit to Stand: Min assist;+2 safety/equipment         General transfer comment: Verbal cues for hand placement and technique.  Assist to steady during transition. Poor balance in standing.  Ambulation/Gait Ambulation/Gait assistance: Mod assist;+2 safety/equipment Ambulation Distance (Feet): 62 Feet Assistive device: Rolling walker (2 wheeled) Gait Pattern/deviations: Step-through pattern;Decreased stride length;Decreased weight shift to right;Scissoring;Ataxic;Trunk flexed;Narrow base of support Gait velocity: decreased Gait velocity interpretation: Below normal speed for age/gender General Gait Details: Verbal cues for safe use of RW.  Patient with scissoring gait - cues to widen BOS.  Patient with ataxic gait, requiring mod assist to maintain balance.   Stairs            Wheelchair Mobility    Modified Rankin (Stroke Patients Only) Modified Rankin (Stroke Patients Only) Pre-Morbid Rankin Score: Moderately severe disability Modified Rankin: Moderately severe disability     Balance Overall balance assessment: Needs assistance         Standing balance support: Bilateral upper extremity supported;During functional activity Standing balance-Leahy Scale: Poor Standing balance comment: Requires UE support to maintain static balance, and mod assist during dynamic activities.                    Cognition Arousal/Alertness: Awake/alert Behavior During Therapy: WFL for tasks assessed/performed Overall Cognitive Status: Within Functional Limits for tasks assessed                      Exercises      General Comments        Pertinent Vitals/Pain Pain Assessment: No/denies pain    Home Living                      Prior Function            PT Goals (current goals can now be found in the care plan section) Progress towards PT goals: Progressing toward goals    Frequency  Min 3X/week    PT Plan Current plan remains appropriate    Co-evaluation             End of Session Equipment Utilized During Treatment: Gait belt Activity Tolerance: Patient tolerated treatment well;Patient limited by fatigue Patient left: in chair;with call bell/phone within reach;with chair alarm set     Time: 1401-1417 PT Time Calculation (min) (ACUTE ONLY): 16 min  Charges:  $Gait Training: 8-22 mins                    G Codes:      Despina Pole 2014-07-13, 6:33 PM Carita Pian. Sanjuana Kava, [place] Pager [phone]

## 2014-07-08 NOTE — Progress Notes (Signed)
[facility] PROGRESS NOTE  Hayden Mckinney [id number] DOB: 1945/11/25 DOA: 07/06/2014  PCP: Blanchie Serve, MD  Brief HPI: 69 year old Caucasian male with a past medical history of diabetes, prior stroke, hypertension, hyperlipidemia, presented from an assisted living facility after a syncopal episode. He did have significant dysarthria from his previous stroke and so history was limited. He was admitted for further evaluation. MRI did reveal a small stroke. Neurology was consulted. He was also detected to have a UTI.  Past medical history:  Past Medical History  Diagnosis Date  . Type II diabetes mellitus   . Sarcoma     a. R leg  . Diabetic peripheral neuropathy     a. both feet  . TIA (transient ischemic attack)     a. 08/2010.  Marland Kitchen Hyperlipidemia   . Hypertension   . Stroke     a. 04/2014 multiple bateral infarcts, presumed to be embolic;  b. [id number] Carotid U/S: 1-39% bilat ICA stenosis.  . H/O echocardiogram     a. 04/2014 Echo: EF 55-60%, no rwma.  Marland Kitchen ETOH abuse     a. 04/2014 18-24 beers q weekend.  . CVA (cerebral vascular accident)     Consultants: Neurology  Procedures:  EEG  "Clinical Interpretation: This normal EEG is recorded in the waking and drowsy state. There was no seizure or seizure predisposition recorded on this study."  Antibiotics: Imipenem 6/30  Subjective: Patient feels well this morning. No complaints offered. Wondering when he is kind of be discharged. Does mention some discomfort with urination.  Objective: Vital Signs  Filed Vitals:   07/07/14 1433 07/07/14 1717 07/07/14 2121 07/08/14 0547  BP: 149/74 151/81 144/62 166/72  Pulse: 77 73 70 68  Temp: 98 F (36.7 C) 98.2 F (36.8 C) 98.5 F (36.9 C) 98.2 F (36.8 C)  TempSrc: Oral Oral Oral Oral  Resp: 20 20 18 18   Height:      Weight:      SpO2: 100% 100% 100% 99%    Intake/Output Summary (Last 24 hours) at 07/08/14 0858 Last data filed at 07/08/14 0542  Gross per 24  hour  Intake      0 ml  Output   1350 ml  Net  -1350 ml   Filed Weights   07/06/14 0926 07/06/14 1117  Weight: 71.2 kg (156 lb 15.5 oz) 70 kg (154 lb 5.2 oz)    General appearance: alert, cooperative, appears stated age and no distress Resp: clear to auscultation bilaterally Cardio: regular rate and rhythm, S1, S2 normal, no murmur, click, rub or gallop GI: soft, non-tender; bowel sounds normal; no masses,  no organomegaly Neurologic: Noted to be dysarthric. Tongue is midline. No focal weaknesses noted.  Lab Results:  Basic Metabolic Panel:  Recent Labs Lab 07/06/14 0855 07/06/14 0900 07/07/14 0910  NA 138 138 140  K 4.9 4.9 4.8  CL 111 108 109  CO2 22  --  26  GLUCOSE 161* 158* 116*  BUN 23* 26* 16  CREATININE 1.75* 1.60* 1.30*  CALCIUM 8.6*  --  8.7*   Liver Function Tests:  Recent Labs Lab 07/06/14 0855  AST 12*  ALT 13*  ALKPHOS 68  BILITOT 0.3  PROT 5.8*  ALBUMIN 2.6*   CBC:  Recent Labs Lab 07/06/14 0855 07/06/14 0900 07/07/14 0910  WBC 6.8  --  7.3  NEUTROABS 4.0  --   --   HGB 8.9* 9.2* 10.0*  HCT 27.5* 27.0* 31.5*  MCV 84.4  --  84.2  PLT 317  --  343   CBG:  Recent Labs Lab 07/07/14 1713 07/07/14 2104 07/08/14 0015 07/08/14 0434 07/08/14 0805  GLUCAP 164* 127* 85 96 132*    Recent Results (from the past 240 hour(s))  Urine culture     Status: None (Preliminary result)   Collection Time: 07/06/14  5:17 PM  Result Value Ref Range Status   Specimen Description URINE, CLEAN CATCH  Final   Special Requests NONE  Final   Culture >=100,000 COLONIES/mL ESCHERICHIA COLI  Final   Report Status PENDING  Incomplete      Studies/Results: Ct Angio Head W/cm &/or Wo Cm  07/06/2014   CLINICAL DATA:  69 year old diabetic hypertensive male with hyperlipidemia and alcohol abuse presenting with slurred speech. History of sarcoma. Prior infarcts. Subsequent encounter.  EXAM: CT ANGIOGRAPHY HEAD AND NECK  TECHNIQUE: Multidetector CT imaging of  the head and neck was performed using the standard protocol during bolus administration of intravenous contrast. Multiplanar CT image reconstructions and MIPs were obtained to evaluate the vascular anatomy. Carotid stenosis measurements (when applicable) are obtained utilizing NASCET criteria, using the distal internal carotid diameter as the denominator.  CONTRAST:  77mL OMNIPAQUE IOHEXOL 350 MG/ML SOLN  COMPARISON:  04/19/2014 CT angiogram.  04/16/2014 MR brain.  FINDINGS: CT HEAD  Brain: No intracranial hemorrhage.  Moderate-size mid to superior right cerebellar infarct with encephalomalacia (noted as acute 04/16/2014).  Bilateral remote basal ganglia and thalamic infarcts  Global atrophy without hydrocephalus.  No intracranial mass or abnormal enhancement.  Calvarium and skull base: Negative.  Paranasal sinuses: Polypoid opacification inferior right maxillary sinus.  Orbits: Negative.  CTA NECK  Aortic arch: 3 vessel aortic arch with mild ectasia of the aortic arch. Ascending thoracic aorta measures up to 3.2 cm.  Right carotid system: Carotid bifurcation plaque. Plaque extends into the proximal 1.5 cm of the right internal carotid artery with 60% diameter stenosis. Right internal carotid artery causes slight impression upon the right posterior pharynx.  Left carotid system: Ectatic left common carotid artery with mild noncalcified plaque. Partially calcified plaque left carotid bifurcation without hemodynamically significant stenosis.  Vertebral arteries:Left vertebral artery is dominant. Moderate stenosis left subclavian artery just proximal to the takeoff of the left vertebral artery. Mild to moderate narrowing proximal left vertebral artery. Moderate to marked narrowing proximal right vertebral artery.  Skeleton: Cervical spondylotic changes most notable C5-6 and C6-7.  Other neck: No worrisome neck mass identified. Underfilling of the right piriform sinus without mass identified.  CTA HEAD  Anterior  circulation: Calcified and noncalcified plaque cavernous segment internal carotid artery bilaterally with moderate narrowing anterior genu of the right internal carotid artery.  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  Moderate narrowing A2 segment branches bilaterally.  Moderate narrowing M2 segment branches of loculated more notable on the right.  Posterior circulation: Non dominant right vertebral artery predominantly ends in a posterior inferior cerebellar artery distribution with only tiny vessel traversing towards the basilar artery. Moderate narrowing of the distal right vertebral artery and right posterior inferior cerebellar artery.  Mild narrowing distal left vertebral artery.  Mild moderate narrowing of portions of the basilar artery.  Moderate tandem stenosis P1 and P2 segment posterior cerebral artery bilaterally.  Moderate narrowing superior cerebellar artery greater on right.  Poor delineation of the right anterior inferior cerebellar artery with narrowing of the left anterior inferior cerebellar artery.  Venous sinuses: Negative  Anatomic variants: As above  Delayed phase: Negative.  IMPRESSION: CT HEAD  No intracranial hemorrhage.  Moderate-size mid to superior right cerebellar infarct with encephalomalacia (noted as acute 04/16/2014).  Bilateral remote basal ganglia and thalamic infarcts.  Small vessel disease type changes without CT evidence of large acute infarct.  Global atrophy without hydrocephalus.  CTA NECK  Plaque proximal 1.5 cm of the right internal carotid artery with 60% diameter stenosis. Right internal carotid artery causes slight impression upon the right posterior pharynx.  Partially calcified plaque left carotid bifurcation without hemodynamically significant stenosis.  Left vertebral artery is dominant. Moderate stenosis left subclavian artery just proximal to the takeoff of the left  vertebral artery. Mild to moderate narrowing proximal left vertebral artery.  Moderate to marked narrowing proximal right vertebral artery.  CTA HEAD  Moderate narrowing anterior genu of the right internal carotid artery.  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  Moderate narrowing A2 segment branches bilaterally.  Moderate narrowing M2 segment branches more notable on the right.  Non dominant right vertebral artery predominantly ends in a posterior inferior cerebellar artery distribution with only tiny vessel traversing towards the basilar artery. Moderate narrowing of the distal right vertebral artery and right posterior inferior cerebellar artery.  Mild narrowing distal left vertebral artery.  Mild to moderate narrowing of portions of the basilar artery.  Moderate tandem stenosis P1 and P2 segment posterior cerebral artery bilaterally.  Moderate narrowing superior cerebellar artery greater on right.  Poor delineation of the right anterior inferior cerebellar artery with narrowing of the left anterior inferior cerebellar artery.  Images reviewed with Etta Quill neurology 35 assistant at the time of imaging.   Electronically Signed   By: Genia Del M.D.   On: 07/06/2014 10:00   X-ray Chest Pa And Lateral  07/06/2014   CLINICAL DATA:  Chest and abdominal pain.  EXAM: CHEST  2 VIEW  COMPARISON:  04/20/2014  FINDINGS: There is cardiomegaly. Lungs are clear. No effusions. No acute bony abnormality.  IMPRESSION: Cardiomegaly.  No active disease.   Electronically Signed   By: Rolm Baptise M.D.   On: 07/06/2014 16:00   Ct Angio Neck W/cm &/or Wo/cm  07/06/2014   CLINICAL DATA:  69 year old diabetic hypertensive male with hyperlipidemia and alcohol abuse presenting with slurred speech. History of sarcoma. Prior infarcts. Subsequent encounter.  EXAM: CT ANGIOGRAPHY HEAD AND NECK  TECHNIQUE: Multidetector CT  imaging of the head and neck was performed using the standard protocol during bolus administration of intravenous contrast. Multiplanar CT image reconstructions and MIPs were obtained to evaluate the vascular anatomy. Carotid stenosis measurements (when applicable) are obtained utilizing NASCET criteria, using the distal internal carotid diameter as the denominator.  CONTRAST:  60mL OMNIPAQUE IOHEXOL 350 MG/ML SOLN  COMPARISON:  04/19/2014 CT angiogram.  04/16/2014 MR brain.  FINDINGS: CT HEAD  Brain: No intracranial hemorrhage.  Moderate-size mid to superior right cerebellar infarct with encephalomalacia (noted as acute 04/16/2014).  Bilateral remote basal ganglia and thalamic infarcts  Global atrophy without hydrocephalus.  No intracranial mass or abnormal enhancement.  Calvarium and skull base: Negative.  Paranasal sinuses: Polypoid opacification inferior right maxillary sinus.  Orbits: Negative.  CTA NECK  Aortic arch: 3 vessel aortic arch with mild ectasia of the aortic arch. Ascending thoracic aorta measures up to 3.2 cm.  Right carotid system: Carotid bifurcation plaque. Plaque extends into the proximal 1.5 cm of the right internal  carotid artery with 60% diameter stenosis. Right internal carotid artery causes slight impression upon the right posterior pharynx.  Left carotid system: Ectatic left common carotid artery with mild noncalcified plaque. Partially calcified plaque left carotid bifurcation without hemodynamically significant stenosis.  Vertebral arteries:Left vertebral artery is dominant. Moderate stenosis left subclavian artery just proximal to the takeoff of the left vertebral artery. Mild to moderate narrowing proximal left vertebral artery. Moderate to marked narrowing proximal right vertebral artery.  Skeleton: Cervical spondylotic changes most notable C5-6 and C6-7.  Other neck: No worrisome neck mass identified. Underfilling of the right piriform sinus without mass identified.  CTA HEAD   Anterior circulation: Calcified and noncalcified plaque cavernous segment internal carotid artery bilaterally with moderate narrowing anterior genu of the right internal carotid artery.  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  Moderate narrowing A2 segment branches bilaterally.  Moderate narrowing M2 segment branches of loculated more notable on the right.  Posterior circulation: Non dominant right vertebral artery predominantly ends in a posterior inferior cerebellar artery distribution with only tiny vessel traversing towards the basilar artery. Moderate narrowing of the distal right vertebral artery and right posterior inferior cerebellar artery.  Mild narrowing distal left vertebral artery.  Mild moderate narrowing of portions of the basilar artery.  Moderate tandem stenosis P1 and P2 segment posterior cerebral artery bilaterally.  Moderate narrowing superior cerebellar artery greater on right.  Poor delineation of the right anterior inferior cerebellar artery with narrowing of the left anterior inferior cerebellar artery.  Venous sinuses: Negative  Anatomic variants: As above  Delayed phase: Negative.  IMPRESSION: CT HEAD  No intracranial hemorrhage.  Moderate-size mid to superior right cerebellar infarct with encephalomalacia (noted as acute 04/16/2014).  Bilateral remote basal ganglia and thalamic infarcts.  Small vessel disease type changes without CT evidence of large acute infarct.  Global atrophy without hydrocephalus.  CTA NECK  Plaque proximal 1.5 cm of the right internal carotid artery with 60% diameter stenosis. Right internal carotid artery causes slight impression upon the right posterior pharynx.  Partially calcified plaque left carotid bifurcation without hemodynamically significant stenosis.  Left vertebral artery is dominant. Moderate stenosis left subclavian artery just proximal to the takeoff of  the left vertebral artery. Mild to moderate narrowing proximal left vertebral artery.  Moderate to marked narrowing proximal right vertebral artery.  CTA HEAD  Moderate narrowing anterior genu of the right internal carotid artery.  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  Moderate narrowing A2 segment branches bilaterally.  Moderate narrowing M2 segment branches more notable on the right.  Non dominant right vertebral artery predominantly ends in a posterior inferior cerebellar artery distribution with only tiny vessel traversing towards the basilar artery. Moderate narrowing of the distal right vertebral artery and right posterior inferior cerebellar artery.  Mild narrowing distal left vertebral artery.  Mild to moderate narrowing of portions of the basilar artery.  Moderate tandem stenosis P1 and P2 segment posterior cerebral artery bilaterally.  Moderate narrowing superior cerebellar artery greater on right.  Poor delineation of the right anterior inferior cerebellar artery with narrowing of the left anterior inferior cerebellar artery.  Images reviewed with Etta Quill neurology 87 assistant at the time of imaging.   Electronically Signed   By: Genia Del M.D.   On: 07/06/2014 10:00   [name] Contrast  07/06/2014  CLINICAL DATA:  Syncopal episode. Patient developed slowed speech and became unresponsive with hypotension at that time.  EXAM: MRI HEAD WITHOUT CONTRAST  TECHNIQUE: Multiplanar, multiecho pulse sequences of the brain and surrounding structures were obtained without intravenous contrast.  COMPARISON:  Head CTA 07/06/2014 and MRI 04/16/2014  FINDINGS: There is a 3 mm acute infarct along the lateral margin of the body of the right lateral ventricle. A chronic infarct is again seen involving the right basal ganglia and right corona radiata more posteriorly with a small amount of increased  diffusion weighted signal again seen along its margins, similar to the prior study and without definite evidence of superimposed acute ischemia. A small amount of chronic blood products are again seen associated with the old right basal ganglia infarct, and chronic blood products are also again noted associated with a chronic left basal ganglia infarct. Chronic lacunar infarcts are again noted in the thalami.  Subacute superior right cerebellar infarct is noted, acute on the prior MRI. Patchy T2 hyperintensities in the cerebral white matter bilaterally are similar to the prior MRI and compatible with moderate chronic small vessel ischemic disease. There is mild to moderate cerebral atrophy. There is no evidence of mass, midline shift, or extra-axial fluid collection.  Orbits are unremarkable. Right maxillary sinus mucous retention cyst is noted. No significant mastoid fluid. Major intracranial arterial flow voids are preserved. Right vertebral artery is again noted to be hypoplastic. Abnormal left transverse sinus, left sigmoid sinus, and left jugular bulb flow voids are unchanged from the prior MRI and may reflect slow flow as there was no evidence of venous sinus thrombosis on CTA earlier today.  IMPRESSION: 1. Punctate acute infarct adjacent to the right lateral ventricle. 2. Chronic bilateral basal ganglia and the laminae ache infarcts and subacute right cerebellar infarct. 3. Moderate chronic small vessel ischemic disease.   Electronically Signed   By: Logan Bores   On: 07/06/2014 13:31   Dg Abd 2 Views  07/06/2014   CLINICAL DATA:  Chest and abdominal pain.  EXAM: ABDOMEN - 2 VIEW  COMPARISON:  None.  FINDINGS: Nonobstructive bowel gas pattern. No free air. No organomegaly. Contrast material is noted in the bladder. No acute bony abnormality. No suspicious calcification visualized.  IMPRESSION: Negative.   Electronically Signed   By: Rolm Baptise M.D.   On: 07/06/2014 16:01    Medications:   Scheduled: . aspirin  300 mg Rectal Daily  . carvedilol  6.25 mg Oral BID WC  . cefpodoxime  200 mg Oral Q12H  . clopidogrel  75 mg Oral Daily  . docusate sodium  100 mg Oral BID  . famotidine  20 mg Oral Daily  . heparin  5,000 Units Subcutaneous 3 times per day  . insulin aspart  0-9 Units Subcutaneous 6 times per day  . sodium chloride  3 mL Intravenous Q12H   Continuous:  [id number] **OR** acetaminophen, alum & mag hydroxide-simeth, bisacodyl, hydrALAZINE, morphine injection, ondansetron **OR** ondansetron (ZOFRAN) IV, senna-docusate  Assessment/Plan:  Principal Problem:   Syncopal episodes Active Problems:   Type II diabetes mellitus with neurological manifestations   HTN (hypertension)   Hyperlipidemia   Left sided abdominal pain   Syncope and collapse   Cerebral thrombosis with cerebral infarction   Acute CVA (cerebrovascular accident)    Syncopal episode Etiology unclear. Patient was noted to be hypotensive per EMS reports. Creatinine was also elevated. He could have been dehydrated. MRI brain did show small acute infarct. Seen by physical and  occupational therapy and they do recommend skilled nursing facility placement.  Acute stroke in the setting of recent stroke Acute infarct noted near the right lateral ventricle. Unlikely to have caused his above presentation. Discussed with Dr. Leonie Man with neurology. Considering his recent stroke and workup during that time there is no need for further investigations. No change to his antiplatelet therapy. He is also on statin which will be continued. Patient has dysarthria from his previous stroke.  Hypotension in a patient with essential hypertension This was transient. Could've contributed to his syncope. Her pressures have been stable here in the hospital and sometimes in the hypertensive range. His anti-hypertensive agents were held at the time of admission. Carvedilol was resumed at a lower dose. Blood pressure  continues to be on the higher side. Resume BiDil at twice a day dosage.   Mild acute renal failure Resolved with IV fluids.  Symptomatic UTI with ESBL E Coli Initiate imipenem.  Left lower quadrant abdominal pain Could have been secondary to the UTI. Abdominal x-ray was unremarkable. He is no longer tender. He was also noted to be constipated. Continue with stool softeners and suppositories.  Diabetes mellitus type 2 Continue with sliding scale coverage. His glimepiride is being held.   DVT Prophylaxis: Subcutaneous heparin    Code Status: Full code  Family Communication: No family at bedside  Disposition Plan: Await placement.    LOS: 2 days   [facility] Pager [phone] 07/08/2014, 8:58 AM  If 7PM-7AM, please contact night-coverage at [email or web address], password [facility]

## 2014-07-08 NOTE — Progress Notes (Signed)
Urine very cloudy and malodorous. UA sent down to lab. Results pending.

## 2014-07-08 NOTE — Progress Notes (Signed)
STROKE TEAM PROGRESS NOTE   SUBJECTIVE (INTERVAL HISTORY) .69 year old male with history of recent stroke presented with acute onset dysarthria and altered mental status in the setting of hypotension documented by EMS with NIH stroke scale of 6  No famiy is at the bedside.  Overall he feels his condition is stable. EEG was normal. Await SNF bed   OBJECTIVE Temp:  [98.2 F (36.8 C)-98.5 F (36.9 C)] 98.4 F (36.9 C) (06/30 0957) Pulse Rate:  [66-73] 66 (06/30 0957) Cardiac Rhythm:  [-] Heart block (06/30 0839) Resp:  [18-20] 20 (06/30 0957) BP: (144-172)/(62-81) 172/76 mmHg (06/30 0957) SpO2:  [99 %-100 %] 100 % (06/30 0957)   Recent Labs Lab 07/07/14 2104 07/08/14 0015 07/08/14 0434 07/08/14 0805 07/08/14 1136  GLUCAP 127* 85 96 132* 99    Recent Labs Lab 07/06/14 0855 07/06/14 0900 07/07/14 0910  NA 138 138 140  K 4.9 4.9 4.8  CL 111 108 109  CO2 22  --  26  GLUCOSE 161* 158* 116*  BUN 23* 26* 16  CREATININE 1.75* 1.60* 1.30*  CALCIUM 8.6*  --  8.7*    Recent Labs Lab 07/06/14 0855  AST 12*  ALT 13*  ALKPHOS 68  BILITOT 0.3  PROT 5.8*  ALBUMIN 2.6*    Recent Labs Lab 07/06/14 0855 07/06/14 0900 07/07/14 0910  WBC 6.8  --  7.3  NEUTROABS 4.0  --   --   HGB 8.9* 9.2* 10.0*  HCT 27.5* 27.0* 31.5*  MCV 84.4  --  84.2  PLT 317  --  343   No results for input(s): CKTOTAL, CKMB, CKMBINDEX, TROPONINI in the last 168 hours.  Recent Labs  07/06/14 0855  LABPROT 14.3  INR 1.09    Recent Labs  07/08/14 0544  COLORURINE YELLOW  LABSPEC 1.010  PHURINE 5.5  GLUCOSEU NEGATIVE  HGBUR SMALL*  BILIRUBINUR NEGATIVE  KETONESUR NEGATIVE  PROTEINUR 30*  UROBILINOGEN 0.2  NITRITE NEGATIVE  LEUKOCYTESUR LARGE*       Component Value Date/Time   CHOL 254* 04/17/2014 1543   TRIG 276* 04/17/2014 1543   HDL 37* 04/17/2014 1543   CHOLHDL 6.9 04/17/2014 1543   VLDL 55* 04/17/2014 1543   LDLCALC 162* 04/17/2014 1543   Lab Results  Component Value  Date   HGBA1C 7.4* 07/06/2014   No results found for: LABOPIA, [place], [place], [place], [facility], [place]   Recent Labs Lab 07/06/14 0855  ETH <5    CT HEAD   07/06/2014   No intracranial hemorrhage.  Moderate-size mid to superior right cerebellar infarct with encephalomalacia (noted as acute 04/16/2014).  Bilateral remote basal ganglia and thalamic infarcts.  Small vessel disease type changes without CT evidence of large acute infarct.  Global atrophy without hydrocephalus.    CTA NECK   07/06/2014   Plaque proximal 1.5 cm of the right internal carotid artery with 60% diameter stenosis. Right internal carotid artery causes slight impression upon the right posterior pharynx.  Partially calcified plaque left carotid bifurcation without hemodynamically significant stenosis.  Left vertebral artery is dominant. Moderate stenosis left subclavian artery just proximal to the takeoff of the left vertebral artery. Mild to moderate narrowing proximal left vertebral artery.  Moderate to marked narrowing proximal right vertebral artery.    CTA HEAD   07/06/2014   Moderate narrowing anterior genu of the right internal carotid artery.  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  Moderate  narrowing A2 segment branches bilaterally.  Moderate narrowing M2 segment branches more notable on the right.  Non dominant right vertebral artery predominantly ends in a posterior inferior cerebellar artery distribution with only tiny vessel traversing towards the basilar artery. Moderate narrowing of the distal right vertebral artery and right posterior inferior cerebellar artery.  Mild narrowing distal left vertebral artery.  Mild to moderate narrowing of portions of the basilar artery.  Moderate tandem stenosis P1 and P2 segment posterior cerebral artery bilaterally.  Moderate narrowing superior cerebellar artery greater on right.   Poor delineation of the right anterior inferior cerebellar artery with narrowing of the left anterior inferior cerebellar artery.    [name] Contrast 07/06/2014    1. Punctate acute infarct adjacent to the right lateral ventricle. 2. Chronic bilateral basal ganglia and the laminae ache infarcts and subacute right cerebellar infarct. 3. Moderate chronic small vessel ischemic disease.     Dg Abd 2 Views 07/06/2014    Negative.     X-ray Chest Pa And Lateral 07/06/2014   Cardiomegaly.  No active disease.     EEG This normal EEG is recorded in the waking and drowsy state. There was no seizure or seizure predisposition recorded on this study.    PHYSICAL EXAM Pleasant middle-age Caucasian male currently not in distress. . Afebrile. Head is nontraumatic. Neck is supple without bruit.    Cardiac exam no murmur or gallop. Lungs are clear to auscultation. Distal pulses are well felt. Neurological Exam : Awake and alert disoriented. Speech is dysarthric and at times rambling and difficult to understand. Follows commands well. Fundi were not visualized. Extraocular movements are full range without nystagmus. Blinks to threat bilaterally. Right lower facial weakness. Tongue midline. Jaw jerk is brisk. Motor system exam reveals no upper or lower extremity drift. Mild weakness of right grip and intrinsic hand muscles. Deep tendon reflexes are 1+ symmetric. Patient has some hyperesthesia to touch and pinprick in both feet likely from underlying peripheral neuropathy. Ankle jerks are depressed. Plantars are downgoing. ASSESSMENT/PLAN Hayden Mckinney is a 69 y.o. male with history of recent stroke, DM, HTN, HLD presenting with acute onset of altered mental status. He did not receive IV t-PA due to stroke within past 90 days.   Stroke:  punctate right lateral periventricular infarct secondary to small vessel disease    MRI  punctate right lateral ventrical infarct  MRA  No significant large vessel  stenosis, there is wide spread intracranial atherosclerosis  ILR placed 04/2014. No indication to urgently interrogate as current stroke is not felt to be embolic.  EEG pending   HgbA1c 7.4  Heparin 5000 units sq tid for VTE prophylaxis DIET DYS 3 Room service appropriate?: Yes; Fluid consistency:: Thin  aspirin 81 mg orally every day and clopidogrel 75 mg orally every day prior to admission, now on clopidogrel 75 mg orally every day and aspirin 300 mg suppository daily  Patient counseled to be compliant with his antithrombotic medications  Ongoing aggressive stroke risk factor management with BP managment  Therapy recommendations:  SNF  Disposition:  SNF  Hypotension, w/ hx Hypertension  Per medicine - determine etiology of hypotension and treatment course  Hyperlipidemia  Home meds:  lipitor 40mg , resumed in hospital  LDL not rechecked   Must continue statin at discharge  Diabetes type II  HgbA1c 7.4, goal < 7.0  Uncontrolled  Other Stroke Risk Factors  Advanced age  ETOH use 04/2014 18-24 beers q weekend.  Hx stroke/TIA -  04/2014 multiple bateral infarcts, presumed to be embolic  Other Active Problems  LLQ abd pain  Normocytic anemia  Hospital day # [place] for Pager information 07/08/2014 2:52 PM  I have personally examined this patient, reviewed notes, independently viewed imaging studies, participated in medical decision making and plan of care. I have made any additions or clarifications directly to the above note. Agree with note above. He presented with dysarthria and altered mental status, small subcortical lacunar infarcts from small vessel disease in the setting of hypotension. He remains at risk for neurological worsening, recurrent stroke, TIA and needs ongoing stroke evaluation. Recommend avoid hypotension and had just blood pressure medications and maintain good hydration status. Discussed with Dr.  [place] Stroke team will sign off. Currently call for questions. Antony Contras, MD Medical Director [facility] Pager: [phone] 07/08/2014 2:52 PM    To contact Stroke Continuity provider, please refer to [email or web address] After hours, contact General Neurology

## 2014-07-09 LAB — GLUCOSE, CAPILLARY
GLUCOSE-CAPILLARY: 161 mg/dL — AB (ref 65–99)
Glucose-Capillary: 122 mg/dL — ABNORMAL HIGH (ref 65–99)
Glucose-Capillary: 100 mg/dL — ABNORMAL HIGH (ref 65–99)
Glucose-Capillary: 163 mg/dL — ABNORMAL HIGH (ref 65–99)
Glucose-Capillary: 112 mg/dL — ABNORMAL HIGH (ref 65–99)

## 2014-07-09 MED ORDER — SULFAMETHOXAZOLE-TRIMETHOPRIM 800-160 MG PO TABS
1.0000 | ORAL_TABLET | Freq: Two times a day (BID) | ORAL | Status: DC
  Administered 2014-07-09 – 2014-07-10 (×3): 1 via ORAL
  Filled 2014-07-09 (×3): qty 1

## 2014-07-09 NOTE — Progress Notes (Signed)
Occupational Therapy Treatment Patient Details Name: Hayden Mckinney MRN: [id number] DOB: 29-Apr-1945 Today's Date: 07/09/2014    History of present illness 69 y.o. male hx of recent stroke admission, DM, HTN, HLD presenting with acute onset of altered mental status. Per EMS he was sitting at breakfast when he developed slurred speech and then became unresponsive. MRI shows acute infarct adjacent to the right lateral ventricle.    OT comments  Patient making slow progress today. Pt with increased confusion and difficult to understand (expressive difficulties); RN made aware of this. Pt unable to state why he was in the hospital, "infection", therapist re-oriented patient.     Follow Up Recommendations  SNF;Supervision/Assistance - 24 hour    Equipment Recommendations  None recommended by OT    Recommendations for Other Services  None at this time  Precautions / Restrictions Precautions Precautions: Fall Restrictions Weight Bearing Restrictions: No    Mobility Bed Mobility Overal bed mobility: Needs Assistance Bed Mobility: Rolling;Sidelying to Sit Rolling: Supervision Sidelying to sit: Min assist General bed mobility comments: Min assist for trunk support  Transfers Overall transfer level: Needs assistance Equipment used: None;1 person hand held assist Transfers: Stand Pivot Transfers Sit to Stand: Min assist General transfer comment: Therapist standing in front for stand pivot transfer EOB>recliner    Balance Overall balance assessment: Needs assistance Sitting-balance support: No upper extremity supported;Feet supported Sitting balance-Leahy Scale: Fair Sitting balance - Comments: pt required occasional min assist for dynamic sitting balance with increased time in sitting, fatigue?   Standing balance support: No upper extremity supported Standing balance-Leahy Scale: Poor Standing balance comment: during stand pivot transfer   ADL Overall ADL's : Needs  assistance/impaired General ADL Comments: Min instructional cueing for initiation and problem solving to complete grooming task while seated in recliner. Max cueing to find "TV power" and push button on call bell remote. Pt engaged in bed mobility and transferred EOB>recliner with min assist (stand pivot transfer without use of AD).       Vision Additional Comments: Pt able to point to red (nurses) button on remote. And when asked which button was "TV power", pt stated green button. No vision problems noticed, but difficult to fully assess secondary to decreased cognition.           Cognition   Behavior During Therapy: WFL for tasks assessed/performed Overall Cognitive Status: No family/caregiver present to determine baseline cognitive functioning (Pt with difficulty follow command to push "TV power" button on remote)                 Pertinent Vitals/ Pain       Pain Assessment: No/denies pain   Frequency Min 2X/week     Progress Toward Goals  OT Goals(current goals can now befound in the care plan section)  Progress towards OT goals: Progressing toward goals     Plan Discharge plan remains appropriate    Activity Tolerance Patient tolerated treatment well  Patient Left in chair;with call bell/phone within reach;with chair alarm set  Nurse Communication Mobility status;Other (comment) (pt with expressive difficulties and increased confusion)        Time: [id number] OT Time Calculation (min): 16 min  Charges: OT General Charges $OT Visit: 1 Procedure OT Treatments $Self Care/Home Management : 8-22 mins  Prajwal Fellner , MS, OTR/L, CLT Pager: [phone]  07/09/2014, 11:50 AM

## 2014-07-09 NOTE — Clinical Social Work Note (Addendum)
CSW informed that patient remains slightly confused. CSW contacted patient's sister Serita Sheller and left a voice message in reference to post-acute placement for SNF. CSW awaiting a returned phone call.   Addendum: CSW was informed by case manager that the pt's sister will only be avaible to talk on her breaks at 72:30pm-1pm, 230pm. CSW left the pt's sister another message at 1241pm. CSW called the sister again at 240pm. During the three times the CSW attempted to contact the pt's sister, her phone went straight to voice mail.   [place], MSW, [place]

## 2014-07-09 NOTE — Progress Notes (Signed)
[facility] PROGRESS NOTE  WARNER BARLING [id number] DOB: 06/16/1945 DOA: 07/06/2014  PCP: Blanchie Serve, MD  Brief HPI: 69 year old Caucasian male with a past medical history of diabetes, prior stroke, hypertension, hyperlipidemia, presented from an assisted living facility after a syncopal episode. He did have significant dysarthria from his previous stroke and so history was limited. He was admitted for further evaluation. MRI did reveal a small stroke. Neurology was consulted. He was also detected to have a UTI.  Past medical history:  Past Medical History  Diagnosis Date  . Type II diabetes mellitus   . Sarcoma     a. R leg  . Diabetic peripheral neuropathy     a. both feet  . TIA (transient ischemic attack)     a. 08/2010.  Marland Kitchen Hyperlipidemia   . Hypertension   . Stroke     a. 04/2014 multiple bateral infarcts, presumed to be embolic;  b. [id number] Carotid U/S: 1-39% bilat ICA stenosis.  . H/O echocardiogram     a. 04/2014 Echo: EF 55-60%, no rwma.  Marland Kitchen ETOH abuse     a. 04/2014 18-24 beers q weekend.  . CVA (cerebral vascular accident)     Consultants: Neurology  Procedures:  EEG  "Clinical Interpretation: This normal EEG is recorded in the waking and drowsy state. There was no seizure or seizure predisposition recorded on this study."  Antibiotics: Imipenem 6/30  Subjective: Patient denies any complaints this morning. Overall feels well.  Objective: Vital Signs  Filed Vitals:   07/08/14 1820 07/08/14 2127 07/09/14 0155 07/09/14 0542  BP: 157/68 135/59 117/48 120/58  Pulse: 68 71 80 79  Temp: 98.1 F (36.7 C) 98.8 F (37.1 C) 99.8 F (37.7 C) 98.6 F (37 C)  TempSrc: Oral Oral Oral Oral  Resp: 20 20 20 20   Height:      Weight:    69.4 kg (153 lb)  SpO2: 100% 100% 100% 100%    Intake/Output Summary (Last 24 hours) at 07/09/14 0840 Last data filed at 07/08/14 2200  Gross per 24 hour  Intake      0 ml  Output    100 ml  Net   -100 ml    Filed Weights   07/06/14 0926 07/06/14 1117 07/09/14 0542  Weight: 71.2 kg (156 lb 15.5 oz) 70 kg (154 lb 5.2 oz) 69.4 kg (153 lb)    General appearance: alert, cooperative, appears stated age and no distress Resp: clear to auscultation bilaterally Cardio: regular rate and rhythm, S1, S2 normal, no murmur, click, rub or gallop GI: soft, non-tender; bowel sounds normal; no masses,  no organomegaly Neurologic: Noted to be dysarthric. Tongue is midline. No focal weaknesses noted.  Lab Results:  Basic Metabolic Panel:  Recent Labs Lab 07/06/14 0855 07/06/14 0900 07/07/14 0910  NA 138 138 140  K 4.9 4.9 4.8  CL 111 108 109  CO2 22  --  26  GLUCOSE 161* 158* 116*  BUN 23* 26* 16  CREATININE 1.75* 1.60* 1.30*  CALCIUM 8.6*  --  8.7*   Liver Function Tests:  Recent Labs Lab 07/06/14 0855  AST 12*  ALT 13*  ALKPHOS 68  BILITOT 0.3  PROT 5.8*  ALBUMIN 2.6*   CBC:  Recent Labs Lab 07/06/14 0855 07/06/14 0900 07/07/14 0910  WBC 6.8  --  7.3  NEUTROABS 4.0  --   --   HGB 8.9* 9.2* 10.0*  HCT 27.5* 27.0* 31.5*  MCV 84.4  --  84.2  PLT 317  --  343   CBG:  Recent Labs Lab 07/08/14 1136 07/08/14 1623 07/08/14 2125 07/09/14 0645 07/09/14 0739  GLUCAP 99 136* 145* 122* 100*    Recent Results (from the past 240 hour(s))  Urine culture     Status: None   Collection Time: 07/06/14  5:17 PM  Result Value Ref Range Status   Specimen Description URINE, CLEAN CATCH  Final   Special Requests NONE  Final   Culture   Final    >=100,000 COLONIES/mL ESCHERICHIA COLI Confirmed Extended Spectrum Beta-Lactamase Producer (ESBL)    Report Status 07/08/2014 FINAL  Final   Organism ID, Bacteria ESCHERICHIA COLI  Final      Susceptibility   Escherichia coli - MIC*    AMPICILLIN >=32 RESISTANT Resistant     CEFAZOLIN >=64 RESISTANT Resistant     CEFTRIAXONE >=64 RESISTANT Resistant     CIPROFLOXACIN >=4 RESISTANT Resistant     GENTAMICIN >=16 RESISTANT Resistant      IMIPENEM <=0.25 SENSITIVE Sensitive     NITROFURANTOIN <=16 SENSITIVE Sensitive     TRIMETH/SULFA <=20 SENSITIVE Sensitive     AMPICILLIN/SULBACTAM >=32 RESISTANT Resistant     PIP/TAZO 8 SENSITIVE Sensitive     * >=100,000 COLONIES/mL ESCHERICHIA COLI      Studies/Results: No results found.  Medications:  Scheduled: . aspirin  325 mg Oral Daily  . carvedilol  6.25 mg Oral BID WC  . clopidogrel  75 mg Oral Daily  . docusate sodium  100 mg Oral BID  . famotidine  20 mg Oral Daily  . heparin  5,000 Units Subcutaneous 3 times per day  . imipenem-cilastatin  500 mg Intravenous Q8H  . insulin aspart  0-9 Units Subcutaneous TID AC & HS  . isosorbide-hydrALAZINE  1 tablet Oral BID  . sodium chloride  3 mL Intravenous Q12H   Continuous:  [id number] **OR** acetaminophen, alum & mag hydroxide-simeth, bisacodyl, hydrALAZINE, morphine injection, ondansetron **OR** ondansetron (ZOFRAN) IV, senna-docusate  Assessment/Plan:  Principal Problem:   Syncopal episodes Active Problems:   Type II diabetes mellitus with neurological manifestations   HTN (hypertension)   Hyperlipidemia   Left sided abdominal pain   Syncope and collapse   Cerebral thrombosis with cerebral infarction   Acute CVA (cerebrovascular accident)    Syncopal episode Etiology unclear. Patient was noted to be hypotensive per EMS reports. Creatinine was also elevated. He could have been dehydrated. MRI brain did show small acute infarct. Seen by physical and occupational therapy and they do recommend skilled nursing facility placement.  Acute stroke in the setting of recent stroke Acute infarct noted near the right lateral ventricle. Unlikely to have caused his above presentation. Discussed with Dr. Leonie Man with neurology. Considering his recent stroke and workup during that time there is no need for further investigations. No change to his antiplatelet therapy. He is also on statin which will be continued. Patient  has dysarthria from his previous stroke.  Hypotension in a patient with essential hypertension This was transient. Could've contributed to his syncope. His pressures have been stable here in the hospital and sometimes in the hypertensive range. His anti-hypertensive agents were held at the time of admission. Carvedilol was resumed at a lower dose. Blood pressure continued to be on the higher side. Resumed BiDil at twice a day dosage. Blood pressure is better today.  Mild acute renal failure Resolved with IV fluids.  Symptomatic UTI with ESBL E Coli Continue imipenem. Will need to be treated for 7  days. Hopefully can be transitioned to ertapenem when he is discharged to skilled nursing facility. He may not require a PICC line.  Left lower quadrant abdominal pain Could have been secondary to the UTI. Abdominal x-ray was unremarkable. He is no longer tender. He was also noted to be constipated. Continue with stool softeners and suppositories.  Diabetes mellitus type 2 Continue with sliding scale coverage. His glimepiride is being held.   DVT Prophylaxis: Subcutaneous heparin    Code Status: Full code  Family Communication: No family at bedside  Disposition Plan: Await placement.    LOS: 3 days   [facility] Pager [phone] 07/09/2014, 8:40 AM  If 7PM-7AM, please contact night-coverage at [email or web address], password [facility]

## 2014-07-10 LAB — GLUCOSE, CAPILLARY
GLUCOSE-CAPILLARY: 114 mg/dL — AB (ref 65–99)
GLUCOSE-CAPILLARY: 181 mg/dL — AB (ref 65–99)

## 2014-07-10 LAB — CBC
HCT: 27.6 % — ABNORMAL LOW (ref 39.0–52.0)
Hemoglobin: 8.8 g/dL — ABNORMAL LOW (ref 13.0–17.0)
MCH: 26.7 pg (ref 26.0–34.0)
MCHC: 31.9 g/dL (ref 30.0–36.0)
MCV: 83.9 fL (ref 78.0–100.0)
PLATELETS: 241 10*3/uL (ref 150–400)
RBC: 3.29 MIL/uL — ABNORMAL LOW (ref 4.22–5.81)
RDW: 13.7 % (ref 11.5–15.5)
WBC: 5.8 10*3/uL (ref 4.0–10.5)

## 2014-07-10 LAB — BASIC METABOLIC PANEL
ANION GAP: 8 (ref 5–15)
BUN: 14 mg/dL (ref 6–20)
CALCIUM: 8.8 mg/dL — AB (ref 8.9–10.3)
CO2: 24 mmol/L (ref 22–32)
CREATININE: 1.65 mg/dL — AB (ref 0.61–1.24)
Chloride: 109 mmol/L (ref 101–111)
GFR calc Af Amer: 47 mL/min — ABNORMAL LOW (ref >60)
GFR calc non Af Amer: 41 mL/min — ABNORMAL LOW (ref >60)
Glucose, Bld: 110 mg/dL — ABNORMAL HIGH (ref 65–99)
Potassium: 4.3 mmol/L (ref 3.5–5.1)
SODIUM: 141 mmol/L (ref 135–145)

## 2014-07-10 MED ORDER — ISOSORB DINITRATE-HYDRALAZINE 20-37.5 MG PO TABS: 1.0000 | ORAL_TABLET | Freq: Two times a day (BID) | ORAL | Status: DC

## 2014-07-10 MED ORDER — ASPIRIN 325 MG PO TABS: 325.0000 mg | ORAL_TABLET | Freq: Every day | ORAL | Status: DC

## 2014-07-10 MED ORDER — FAMOTIDINE 20 MG PO TABS: 20.0000 mg | ORAL_TABLET | Freq: Every day | ORAL | Status: DC

## 2014-07-10 MED ORDER — SULFAMETHOXAZOLE-TRIMETHOPRIM 800-160 MG PO TABS: 1.0000 | ORAL_TABLET | Freq: Two times a day (BID) | ORAL | Status: DC

## 2014-07-10 MED ORDER — DOCUSATE SODIUM 100 MG PO CAPS: 100.0000 mg | ORAL_CAPSULE | Freq: Two times a day (BID) | ORAL | Status: DC

## 2014-07-10 MED ORDER — ACETAMINOPHEN 325 MG PO TABS
650.0000 mg | ORAL_TABLET | Freq: Four times a day (QID) | ORAL | Status: DC | PRN
Start: 2014-07-10 — End: 2020-10-25

## 2014-07-10 MED ORDER — BISACODYL 10 MG RE SUPP: 10.0000 mg | Freq: Every day | RECTAL | Status: DC | PRN

## 2014-07-10 NOTE — Social Work (Signed)
CSW contacted patient's sister on Saturday. Sister had lots of questions which the CSW was able to provide support and guidance. CSW reiterated that [facility] cannot identify quality of facility but share options and locations. Sister chose bed at [facility].  CSW will prepare for discharge once patient is medically cleared.   Christene Lye MSW, [place]

## 2014-07-10 NOTE — Discharge Summary (Signed)
[facility]  Physician Discharge Summary   Patient ID: Hayden Mckinney MRN: [id number] DOB/AGE: 1945-09-11 69 y.o.  Admit date: 07/06/2014 Discharge date: 07/10/2014  PCP: Blanchie Serve, MD  DISCHARGE DIAGNOSES:  Principal Problem:   Syncopal episodes Active Problems:   Type II diabetes mellitus with neurological manifestations   HTN (hypertension)   Hyperlipidemia   Left sided abdominal pain   Syncope and collapse   Cerebral thrombosis with cerebral infarction   Acute CVA (cerebrovascular accident)   RECOMMENDATIONS FOR OUTPATIENT FOLLOW UP: 1. Please check basic metabolic panel on July 5   DISCHARGE CONDITION: fair  Diet recommendation: Modified carbohydrate in the form of dysphagia 3 diet with thin liquids  Filed Weights   07/06/14 1117 07/09/14 0542 07/10/14 0517  Weight: 70 kg (154 lb 5.2 oz) 69.4 kg (153 lb) 69.5 kg (153 lb 3.5 oz)    INITIAL HISTORY: 69 year old Caucasian male with a past medical history of diabetes, prior stroke, hypertension, hyperlipidemia, presented from an assisted living facility after a syncopal episode. He did have significant dysarthria from his previous stroke and so history was limited. He was admitted for further evaluation. MRI did reveal a small stroke. Neurology was consulted. He was also detected to have a UTI.  Consultants: Neurology  Procedures:  EEG  "Clinical Interpretation: This normal EEG is recorded in the waking and drowsy state. There was no seizure or seizure predisposition recorded on this study."  HOSPITAL COURSE:   Syncopal episode Etiology remains unclear. Patient was noted to be hypotensive per EMS reports. Creatinine was also mildly elevated from his baseline. He could have been dehydrated. MRI brain did show small acute infarct. Seen by physical and occupational therapy and they do recommend skilled nursing facility placement. He didn't have any further episodes of syncope.  Acute stroke in the  setting of recent stroke Acute infarct noted near the right lateral ventricle. Unlikely to have caused his above presentation. Discussed with Dr. Leonie Man with neurology. Considering his recent stroke and workup during that time there is no need for further investigations. No change to his antiplatelet therapy. He is also on statin which will be continued. Patient has dysarthria from his previous stroke.  Hypotension in a patient with essential hypertension This was transient. Could've contributed to his syncope. His pressures have been stable here in the hospital and sometimes in the hypertensive range. His anti-hypertensive agents were held at the time of admission. Carvedilol was resumed at a lower dose. Blood pressure continued to be on the higher side. Resumed BiDil at twice a day dosage. Blood pressure is better. Continue to monitor closely  Mild acute renal failure Resolved with IV fluids. His creatinine appears to be at baseline. Which is around 1.5. Will have SNF repeat renal function on July 5. He has good urine output.  Symptomatic UTI with ESBL E Coli Patient was initially placed on imipenem. However, cultures did report sensitivity to Bactrim. Antibiotics were changed subsequently. He has been tolerating it quite well. He'll be discharged on 5 more days of oral antibiotics.   Left lower quadrant abdominal pain Could have been secondary to the UTI. Abdominal x-ray was unremarkable. He is no longer tender. He was also noted to be constipated. Continue with stool softeners and suppositories.  Diabetes mellitus type 2 Continue with sliding scale coverage. Resume his oral agents.  Normocytic anemia Hemoglobin is stable. No overt bleeding.  Overall, he is medically stable. Okay for discharge to SNF today.   PERTINENT LABS:  The  results of significant diagnostics from this hospitalization (including imaging, microbiology, ancillary and laboratory) are listed below for reference.     Microbiology: Recent Results (from the past 240 hour(s))  Urine culture     Status: None   Collection Time: 07/06/14  5:17 PM  Result Value Ref Range Status   Specimen Description URINE, CLEAN CATCH  Final   Special Requests NONE  Final   Culture   Final    >=100,000 COLONIES/mL ESCHERICHIA COLI Confirmed Extended Spectrum Beta-Lactamase Producer (ESBL)    Report Status 07/08/2014 FINAL  Final   Organism ID, Bacteria ESCHERICHIA COLI  Final      Susceptibility   Escherichia coli - MIC*    AMPICILLIN >=32 RESISTANT Resistant     CEFAZOLIN >=64 RESISTANT Resistant     CEFTRIAXONE >=64 RESISTANT Resistant     CIPROFLOXACIN >=4 RESISTANT Resistant     GENTAMICIN >=16 RESISTANT Resistant     IMIPENEM <=0.25 SENSITIVE Sensitive     NITROFURANTOIN <=16 SENSITIVE Sensitive     TRIMETH/SULFA <=20 SENSITIVE Sensitive     AMPICILLIN/SULBACTAM >=32 RESISTANT Resistant     PIP/TAZO 8 SENSITIVE Sensitive     * >=100,000 COLONIES/mL ESCHERICHIA COLI     Labs: Basic Metabolic Panel:  Recent Labs Lab 07/06/14 0855 07/06/14 0900 07/07/14 0910 07/10/14 0508  NA 138 138 140 141  K 4.9 4.9 4.8 4.3  CL 111 108 109 109  CO2 22  --  26 24  GLUCOSE 161* 158* 116* 110*  BUN 23* 26* 16 14  CREATININE 1.75* 1.60* 1.30* 1.65*  CALCIUM 8.6*  --  8.7* 8.8*   Liver Function Tests:  Recent Labs Lab 07/06/14 0855  AST 12*  ALT 13*  ALKPHOS 68  BILITOT 0.3  PROT 5.8*  ALBUMIN 2.6*   CBC:  Recent Labs Lab 07/06/14 0855 07/06/14 0900 07/07/14 0910 07/10/14 0508  WBC 6.8  --  7.3 5.8  NEUTROABS 4.0  --   --   --   HGB 8.9* 9.2* 10.0* 8.8*  HCT 27.5* 27.0* 31.5* 27.6*  MCV 84.4  --  84.2 83.9  PLT 317  --  343 241   CBG:  Recent Labs Lab 07/09/14 0739 07/09/14 1210 07/09/14 1602 07/09/14 2123 07/10/14 0644  GLUCAP 100* 163* 161* 112* 114*     IMAGING STUDIES Ct Angio Head W/cm &/or Wo Cm  07/06/2014   CLINICAL DATA:  69 year old diabetic hypertensive male with  hyperlipidemia and alcohol abuse presenting with slurred speech. History of sarcoma. Prior infarcts. Subsequent encounter.  EXAM: CT ANGIOGRAPHY HEAD AND NECK  TECHNIQUE: Multidetector CT imaging of the head and neck was performed using the standard protocol during bolus administration of intravenous contrast. Multiplanar CT image reconstructions and MIPs were obtained to evaluate the vascular anatomy. Carotid stenosis measurements (when applicable) are obtained utilizing NASCET criteria, using the distal internal carotid diameter as the denominator.  CONTRAST:  70m OMNIPAQUE IOHEXOL 350 MG/ML SOLN  COMPARISON:  04/19/2014 CT angiogram.  04/16/2014 MR brain.  FINDINGS: CT HEAD  Brain: No intracranial hemorrhage.  Moderate-size mid to superior right cerebellar infarct with encephalomalacia (noted as acute 04/16/2014).  Bilateral remote basal ganglia and thalamic infarcts  Global atrophy without hydrocephalus.  No intracranial mass or abnormal enhancement.  Calvarium and skull base: Negative.  Paranasal sinuses: Polypoid opacification inferior right maxillary sinus.  Orbits: Negative.  CTA NECK  Aortic arch: 3 vessel aortic arch with mild ectasia of the aortic arch. Ascending thoracic aorta measures up to 3.2  cm.  Right carotid system: Carotid bifurcation plaque. Plaque extends into the proximal 1.5 cm of the right internal carotid artery with 60% diameter stenosis. Right internal carotid artery causes slight impression upon the right posterior pharynx.  Left carotid system: Ectatic left common carotid artery with mild noncalcified plaque. Partially calcified plaque left carotid bifurcation without hemodynamically significant stenosis.  Vertebral arteries:Left vertebral artery is dominant. Moderate stenosis left subclavian artery just proximal to the takeoff of the left vertebral artery. Mild to moderate narrowing proximal left vertebral artery. Moderate to marked narrowing proximal right vertebral artery.  Skeleton:  Cervical spondylotic changes most notable C5-6 and C6-7.  Other neck: No worrisome neck mass identified. Underfilling of the right piriform sinus without mass identified.  CTA HEAD  Anterior circulation: Calcified and noncalcified plaque cavernous segment internal carotid artery bilaterally with moderate narrowing anterior genu of the right internal carotid artery.  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  Moderate narrowing A2 segment branches bilaterally.  Moderate narrowing M2 segment branches of loculated more notable on the right.  Posterior circulation: Non dominant right vertebral artery predominantly ends in a posterior inferior cerebellar artery distribution with only tiny vessel traversing towards the basilar artery. Moderate narrowing of the distal right vertebral artery and right posterior inferior cerebellar artery.  Mild narrowing distal left vertebral artery.  Mild moderate narrowing of portions of the basilar artery.  Moderate tandem stenosis P1 and P2 segment posterior cerebral artery bilaterally.  Moderate narrowing superior cerebellar artery greater on right.  Poor delineation of the right anterior inferior cerebellar artery with narrowing of the left anterior inferior cerebellar artery.  Venous sinuses: Negative  Anatomic variants: As above  Delayed phase: Negative.  IMPRESSION: CT HEAD  No intracranial hemorrhage.  Moderate-size mid to superior right cerebellar infarct with encephalomalacia (noted as acute 04/16/2014).  Bilateral remote basal ganglia and thalamic infarcts.  Small vessel disease type changes without CT evidence of large acute infarct.  Global atrophy without hydrocephalus.  CTA NECK  Plaque proximal 1.5 cm of the right internal carotid artery with 60% diameter stenosis. Right internal carotid artery causes slight impression upon the right posterior pharynx.  Partially calcified  plaque left carotid bifurcation without hemodynamically significant stenosis.  Left vertebral artery is dominant. Moderate stenosis left subclavian artery just proximal to the takeoff of the left vertebral artery. Mild to moderate narrowing proximal left vertebral artery.  Moderate to marked narrowing proximal right vertebral artery.  CTA HEAD  Moderate narrowing anterior genu of the right internal carotid artery.  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  Moderate narrowing A2 segment branches bilaterally.  Moderate narrowing M2 segment branches more notable on the right.  Non dominant right vertebral artery predominantly ends in a posterior inferior cerebellar artery distribution with only tiny vessel traversing towards the basilar artery. Moderate narrowing of the distal right vertebral artery and right posterior inferior cerebellar artery.  Mild narrowing distal left vertebral artery.  Mild to moderate narrowing of portions of the basilar artery.  Moderate tandem stenosis P1 and P2 segment posterior cerebral artery bilaterally.  Moderate narrowing superior cerebellar artery greater on right.  Poor delineation of the right anterior inferior cerebellar artery with narrowing of the left anterior inferior cerebellar artery.  Images reviewed with Etta Quill neurology 82 assistant at the time of imaging.   Electronically Signed  By: Genia Del M.D.   On: 07/06/2014 10:00   X-ray Chest Pa And Lateral  07/06/2014   CLINICAL DATA:  Chest and abdominal pain.  EXAM: CHEST  2 VIEW  COMPARISON:  04/20/2014  FINDINGS: There is cardiomegaly. Lungs are clear. No effusions. No acute bony abnormality.  IMPRESSION: Cardiomegaly.  No active disease.   Electronically Signed   By: Rolm Baptise M.D.   On: 07/06/2014 16:00   Ct Angio Neck W/cm &/or Wo/cm  07/06/2014   CLINICAL DATA:  69 year old diabetic hypertensive  male with hyperlipidemia and alcohol abuse presenting with slurred speech. History of sarcoma. Prior infarcts. Subsequent encounter.  EXAM: CT ANGIOGRAPHY HEAD AND NECK  TECHNIQUE: Multidetector CT imaging of the head and neck was performed using the standard protocol during bolus administration of intravenous contrast. Multiplanar CT image reconstructions and MIPs were obtained to evaluate the vascular anatomy. Carotid stenosis measurements (when applicable) are obtained utilizing NASCET criteria, using the distal internal carotid diameter as the denominator.  CONTRAST:  33m OMNIPAQUE IOHEXOL 350 MG/ML SOLN  COMPARISON:  04/19/2014 CT angiogram.  04/16/2014 MR brain.  FINDINGS: CT HEAD  Brain: No intracranial hemorrhage.  Moderate-size mid to superior right cerebellar infarct with encephalomalacia (noted as acute 04/16/2014).  Bilateral remote basal ganglia and thalamic infarcts  Global atrophy without hydrocephalus.  No intracranial mass or abnormal enhancement.  Calvarium and skull base: Negative.  Paranasal sinuses: Polypoid opacification inferior right maxillary sinus.  Orbits: Negative.  CTA NECK  Aortic arch: 3 vessel aortic arch with mild ectasia of the aortic arch. Ascending thoracic aorta measures up to 3.2 cm.  Right carotid system: Carotid bifurcation plaque. Plaque extends into the proximal 1.5 cm of the right internal carotid artery with 60% diameter stenosis. Right internal carotid artery causes slight impression upon the right posterior pharynx.  Left carotid system: Ectatic left common carotid artery with mild noncalcified plaque. Partially calcified plaque left carotid bifurcation without hemodynamically significant stenosis.  Vertebral arteries:Left vertebral artery is dominant. Moderate stenosis left subclavian artery just proximal to the takeoff of the left vertebral artery. Mild to moderate narrowing proximal left vertebral artery. Moderate to marked narrowing proximal right vertebral artery.   Skeleton: Cervical spondylotic changes most notable C5-6 and C6-7.  Other neck: No worrisome neck mass identified. Underfilling of the right piriform sinus without mass identified.  CTA HEAD  Anterior circulation: Calcified and noncalcified plaque cavernous segment internal carotid artery bilaterally with moderate narrowing anterior genu of the right internal carotid artery.  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  Moderate narrowing A2 segment branches bilaterally.  Moderate narrowing M2 segment branches of loculated more notable on the right.  Posterior circulation: Non dominant right vertebral artery predominantly ends in a posterior inferior cerebellar artery distribution with only tiny vessel traversing towards the basilar artery. Moderate narrowing of the distal right vertebral artery and right posterior inferior cerebellar artery.  Mild narrowing distal left vertebral artery.  Mild moderate narrowing of portions of the basilar artery.  Moderate tandem stenosis P1 and P2 segment posterior cerebral artery bilaterally.  Moderate narrowing superior cerebellar artery greater on right.  Poor delineation of the right anterior inferior cerebellar artery with narrowing of the left anterior inferior cerebellar artery.  Venous sinuses: Negative  Anatomic variants: As above  Delayed phase: Negative.  IMPRESSION: CT HEAD  No intracranial hemorrhage.  Moderate-size mid to superior right cerebellar infarct with encephalomalacia (noted as  acute 04/16/2014).  Bilateral remote basal ganglia and thalamic infarcts.  Small vessel disease type changes without CT evidence of large acute infarct.  Global atrophy without hydrocephalus.  CTA NECK  Plaque proximal 1.5 cm of the right internal carotid artery with 60% diameter stenosis. Right internal carotid artery causes slight impression upon the right posterior pharynx.  Partially  calcified plaque left carotid bifurcation without hemodynamically significant stenosis.  Left vertebral artery is dominant. Moderate stenosis left subclavian artery just proximal to the takeoff of the left vertebral artery. Mild to moderate narrowing proximal left vertebral artery.  Moderate to marked narrowing proximal right vertebral artery.  CTA HEAD  Moderate narrowing anterior genu of the right internal carotid artery.  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  Moderate narrowing A2 segment branches bilaterally.  Moderate narrowing M2 segment branches more notable on the right.  Non dominant right vertebral artery predominantly ends in a posterior inferior cerebellar artery distribution with only tiny vessel traversing towards the basilar artery. Moderate narrowing of the distal right vertebral artery and right posterior inferior cerebellar artery.  Mild narrowing distal left vertebral artery.  Mild to moderate narrowing of portions of the basilar artery.  Moderate tandem stenosis P1 and P2 segment posterior cerebral artery bilaterally.  Moderate narrowing superior cerebellar artery greater on right.  Poor delineation of the right anterior inferior cerebellar artery with narrowing of the left anterior inferior cerebellar artery.  Images reviewed with Etta Quill neurology 65 assistant at the time of imaging.   Electronically Signed   By: Genia Del M.D.   On: 07/06/2014 10:00   [name] Contrast  07/06/2014   CLINICAL DATA:  Syncopal episode. Patient developed slowed speech and became unresponsive with hypotension at that time.  EXAM: MRI HEAD WITHOUT CONTRAST  TECHNIQUE: Multiplanar, multiecho pulse sequences of the brain and surrounding structures were obtained without intravenous contrast.  COMPARISON:  Head CTA 07/06/2014 and MRI 04/16/2014  FINDINGS: There is a 3 mm acute infarct along the  lateral margin of the body of the right lateral ventricle. A chronic infarct is again seen involving the right basal ganglia and right corona radiata more posteriorly with a small amount of increased diffusion weighted signal again seen along its margins, similar to the prior study and without definite evidence of superimposed acute ischemia. A small amount of chronic blood products are again seen associated with the old right basal ganglia infarct, and chronic blood products are also again noted associated with a chronic left basal ganglia infarct. Chronic lacunar infarcts are again noted in the thalami.  Subacute superior right cerebellar infarct is noted, acute on the prior MRI. Patchy T2 hyperintensities in the cerebral white matter bilaterally are similar to the prior MRI and compatible with moderate chronic small vessel ischemic disease. There is mild to moderate cerebral atrophy. There is no evidence of mass, midline shift, or extra-axial fluid collection.  Orbits are unremarkable. Right maxillary sinus mucous retention cyst is noted. No significant mastoid fluid. Major intracranial arterial flow voids are preserved. Right vertebral artery is again noted to be hypoplastic. Abnormal left transverse sinus, left sigmoid sinus, and left jugular bulb flow voids are unchanged from the prior MRI and may reflect slow flow as there was no evidence of venous sinus thrombosis on CTA earlier today.  IMPRESSION: 1. Punctate acute infarct adjacent to the right lateral ventricle. 2. Chronic bilateral basal ganglia and the laminae  ache infarcts and subacute right cerebellar infarct. 3. Moderate chronic small vessel ischemic disease.   Electronically Signed   By: Logan Bores   On: 07/06/2014 13:31   Dg Abd 2 Views  07/06/2014   CLINICAL DATA:  Chest and abdominal pain.  EXAM: ABDOMEN - 2 VIEW  COMPARISON:  None.  FINDINGS: Nonobstructive bowel gas pattern. No free air. No organomegaly. Contrast material is noted in the  bladder. No acute bony abnormality. No suspicious calcification visualized.  IMPRESSION: Negative.   Electronically Signed   By: Rolm Baptise M.D.   On: 07/06/2014 16:01    DISCHARGE EXAMINATION: Filed Vitals:   07/09/14 2121 07/10/14 0216 07/10/14 0517 07/10/14 0957  BP: 132/47 130/80 123/56 130/59  Pulse: 69 72 67 71  Temp: 98.7 F (37.1 C) 98.9 F (37.2 C) 98.3 F (36.8 C) 98.1 F (36.7 C)  TempSrc: Oral Oral Oral Oral  Resp: _0 Height:      Weight:   69.5 kg (153 lb 3.5 oz)   SpO2: 99% 97% 96% 96%   General appearance: alert, cooperative and no distress Resp: clear to auscultation bilaterally Cardio: regular rate and rhythm, S1, S2 normal, no murmur, click, rub or gallop GI: soft, non-tender; bowel sounds normal; no masses,  no organomegaly Extremities: extremities normal, atraumatic, no cyanosis or edema  DISPOSITION: SNF  Discharge Instructions    Discharge instructions    Complete by:  As directed   Please check BMET on 07/13/14 to check renal function.  You were cared for by a hospitalist during your hospital stay. If you have any questions about your discharge medications or the care you received while you were in the hospital after you are discharged, you can call the unit and asked to speak with the hospitalist on call if the hospitalist that took care of you is not available. Once you are discharged, your primary care physician will handle any further medical issues. Please note that NO REFILLS for any discharge medications will be authorized once you are discharged, as it is imperative that you return to your primary care physician (or establish a relationship with a primary care physician if you do not have one) for your aftercare needs so that they can reassess your need for medications and monitor your lab values. If you do not have a primary care physician, you can call [phone] for a physician referral.     Increase activity slowly    Complete by:  As  directed            ALLERGIES: No Known Allergies   Current Discharge Medication List    START taking these medications   Details  acetaminophen (TYLENOL) 325 MG tablet Take 2 tablets (650 mg total) by mouth every 6 (six) hours as needed for mild pain (or Fever >/= 101).    aspirin 325 MG tablet Take 1 tablet (325 mg total) by mouth daily.    bisacodyl (DULCOLAX) 10 MG suppository Place 1 suppository (10 mg total) rectally daily as needed for moderate constipation. Qty: 12 suppository, Refills: 0    docusate sodium (COLACE) 100 MG capsule Take 1 capsule (100 mg total) by mouth 2 (two) times daily. Qty: 10 capsule, Refills: 0    famotidine (PEPCID) 20 MG tablet Take 1 tablet (20 mg total) by mouth daily.    sulfamethoxazole-trimethoprim (BACTRIM DS,SEPTRA DS) 800-160 MG per tablet Take 1 tablet by mouth 2 (two) times daily. For 5 more days. Stop after last  dose on 07/14/14.      CONTINUE these medications which have CHANGED   Details  isosorbide-hydrALAZINE (BIDIL) 20-37.5 MG per tablet Take 1 tablet by mouth 2 (two) times daily.      CONTINUE these medications which have NOT CHANGED   Details  atorvastatin (LIPITOR) 40 MG tablet Take 1 tablet (40 mg total) by mouth daily at 6 PM.    Blood Glucose Monitoring Suppl (RELION CONFIRM GLUCOSE MONITOR) W/DEVICE KIT Use as instructed. Qty: 1 kit, Refills: 0   Associated Diagnoses: Type II or unspecified type diabetes mellitus without mention of complication, not stated as uncontrolled    carvedilol (COREG) 6.25 MG tablet Take 1 tablet (6.25 mg total) by mouth 2 (two) times daily with a meal.    clopidogrel (PLAVIX) 75 MG tablet Take 1 tablet (75 mg total) by mouth daily.    glimepiride (AMARYL) 1 MG tablet Take 1 mg by mouth daily with breakfast.    loperamide (IMODIUM) 2 MG capsule Take 2 mg by mouth as needed for diarrhea or loose stools.    pregabalin (LYRICA) 50 MG capsule Take 1 capsule (50 mg total) by mouth 2 (two) times  daily. Qty: 60 capsule, Refills: 6    tamsulosin (FLOMAX) 0.4 MG CAPS capsule Take 0.4 mg by mouth daily.    insulin aspart (NOVOLOG) 100 UNIT/ML injection Before each meal 3 times a day, 140-199 - 2 units, 200-250 - 4 units, 251-299 - 6 units,  300-349 - 8 units,  350 or above 10 units. Qty: 10 mL, Refills: 11      STOP taking these medications     aspirin EC 81 MG EC tablet        Follow-up Information    Follow up with Bubba Camp, MAHIMA, MD. Schedule an appointment as soon as possible for a visit in 1 week.   Specialty:  Internal Medicine   Why:  post hospitalization follow up   Contact information:   [address] [place] [address] [phone]       TOTAL DISCHARGE TIME: 57 minutes  [facility] Pager [phone]  07/10/2014, 11:14 AM

## 2014-07-10 NOTE — Progress Notes (Signed)
Patient to be discharged to [facility] facility. Patient's neuro assessment unchanged, patient denies pain. Tele removed, IV removed, dressed, and belongings (clothing) in bag for transport. Patient's glasses on patient. Patient aware of transfer. Report called and given to Antoinette at [facility]. Transportation set up with [facility] per CSW. Will continue to monitor patient prior to transport.

## 2014-07-10 NOTE — Discharge Instructions (Signed)
Ischemic Stroke °A stroke (cerebrovascular accident) is the sudden death of brain tissue. It is a medical emergency. A stroke can cause permanent loss of brain function. This can cause problems with different parts of your body. A transient ischemic attack (TIA) is different because it does not cause permanent damage. A TIA is a short-lived problem of poor blood flow affecting a part of the brain. A TIA is also a serious problem because having a TIA greatly increases the chances of having a stroke. When symptoms first develop, you cannot know if the problem might be a stroke or a TIA. °CAUSES  °A stroke is caused by a decrease of oxygen supply to an area of your brain. It is usually the result of a small blood clot or collection of cholesterol or fat (plaque) that blocks blood flow in the brain. A stroke can also be caused by blocked or damaged carotid arteries.  °RISK FACTORS °· High blood pressure (hypertension). °· High cholesterol. °· Diabetes mellitus. °· Heart disease. °· The buildup of plaque in the blood vessels (peripheral artery disease or atherosclerosis). °· The buildup of plaque in the blood vessels providing blood and oxygen to the brain (carotid artery stenosis). °· An abnormal heart rhythm (atrial fibrillation). °· Obesity. °· Smoking. °· Taking oral contraceptives (especially in combination with smoking). °· Physical inactivity. °· A diet high in fats, salt (sodium), and calories. °· Alcohol use. °· Use of illegal drugs (especially cocaine and methamphetamine). °· Being African American. °· Being over the age of 55. °· Family history of stroke. °· Previous history of blood clots, stroke, TIA, or heart attack. °· Sickle cell disease. °SYMPTOMS  °These symptoms usually develop suddenly, or may be newly present upon awakening from sleep: °· Sudden weakness or numbness of the face, arm, or leg, especially on one side of the body. °· Sudden trouble walking or difficulty moving arms or legs. °· Sudden  confusion. °· Sudden personality changes. °· Trouble speaking (aphasia) or understanding. °· Difficulty swallowing. °· Sudden trouble seeing in one or both eyes. °· Double vision. °· Dizziness. °· Loss of balance or coordination. °· Sudden severe headache with no known cause. °· Trouble reading or writing. °DIAGNOSIS  °Your health care provider can often determine the presence or absence of a stroke based on your symptoms, history, and physical exam. Computed tomography (CT) of the brain is usually performed to confirm the stroke, determine causes, and determine stroke severity. Other tests may be done to find the cause of the stroke. These tests may include: °· Electrocardiography. °· Continuous heart monitoring. °· Echocardiography. °· Carotid ultrasonography. °· Magnetic resonance imaging (MRI). °· A scan of the brain circulation. °· Blood tests. °PREVENTION  °The risk of a stroke can be decreased by appropriately treating high blood pressure, high cholesterol, diabetes, heart disease, and obesity and by quitting smoking, limiting alcohol, and staying physically active. °TREATMENT  °Time is of the essence. It is important to seek treatment at the first sign of these symptoms because you may receive a medicine to dissolve the clot (thrombolytic) that cannot be given if too much time has passed since your symptoms began. Even if you do not know when your symptoms began, get treatment as soon as possible as there are other treatment options available including oxygen, intravenous (IV) fluids, and medicines to thin the blood (anticoagulants). Treatment of stroke depends on the duration, severity, and cause of your symptoms. Medicines and dietary changes may be used to address diabetes, high blood   pressure, and other risk factors. Physical, speech, and occupational therapists will assess you and work with you to improve any functions impaired by the stroke. Measures will be taken to prevent short-term and long-term  complications, including infection from breathing foreign material into the lungs (aspiration pneumonia), blood clots in the legs, bedsores, and falls. Rarely, surgery may be needed to remove large blood clots or to open up blocked arteries. °HOME CARE INSTRUCTIONS  °· Take medicines only as directed by your health care provider. Follow the directions carefully. Medicines may be used to control risk factors for a stroke. Be sure you understand all your medicine instructions. °· You may be told to take a medicine to thin the blood, such as aspirin or the anticoagulant warfarin. Warfarin needs to be taken exactly as instructed. °¨ Too much and too little warfarin are both dangerous. Too much warfarin increases the risk of bleeding. Too little warfarin continues to allow the risk for blood clots. While taking warfarin, you will need to have regular blood tests to measure your blood clotting time. These blood tests usually include both the PT and INR tests. The PT and INR results allow your health care provider to adjust your dose of warfarin. The dose can change for many reasons. It is critically important that you take warfarin exactly as prescribed, and that you have your PT and INR levels drawn exactly as directed. °¨ Many foods, especially foods high in vitamin K, can interfere with warfarin and affect the PT and INR results. Foods high in vitamin K include spinach, kale, broccoli, cabbage, collard and turnip greens, brussels sprouts, peas, cauliflower, seaweed, and parsley, as well as beef and pork liver, green tea, and soybean oil. You should eat a consistent amount of foods high in vitamin K. Avoid major changes in your diet, or notify your health care provider before changing your diet. Arrange a visit with a dietitian to answer your questions. °¨ Many medicines can interfere with warfarin and affect the PT and INR results. You must tell your health care provider about any and all medicines you take. This  includes all vitamins and supplements. Be especially cautious with aspirin and anti-inflammatory medicines. Do not take or discontinue any prescribed or over-the-counter medicine except on the advice of your health care provider or pharmacist. °¨ Warfarin can have side effects, such as excessive bruising or bleeding. You will need to hold pressure over cuts for longer than usual. Your health care provider or pharmacist will discuss other potential side effects. °¨ Avoid sports or activities that may cause injury or bleeding. °¨ Be mindful when shaving, flossing your teeth, or handling sharp objects. °¨ Alcohol can change the body's ability to handle warfarin. It is best to avoid alcoholic drinks or consume only very small amounts while taking warfarin. Notify your health care provider if you change your alcohol intake. °¨ Notify your dentist or other health care providers before procedures. °· If swallow studies have determined that your swallowing reflex is present, you should eat healthy foods. Including 5 or more servings of fruits and vegetables a day may reduce the risk of stroke. Foods may need to be a certain consistency (soft or pureed), or small bites may need to be taken in order to avoid aspirating or choking. Certain dietary changes may be advised to address high blood pressure, high cholesterol, diabetes, or obesity. °¨ Food choices that are low in sodium, saturated fat, trans fat, and cholesterol are recommended to manage high blood pressure. °¨   Food choies that are high in fiber, and low in saturated fat, trans fat, and cholesterol may control cholesterol levels.  Controlling carbohydrates and sugar intake is recommended to manage diabetes.  Reducing calorie intake and making food choices that are low in sodium, saturated fat, trans fat, and cholesterol are recommended to manage obesity.  Maintain a healthy weight.  Stay physically active. It is recommended that you get at least 30 minutes of  activity on all or most days.  Do not use any tobacco products including cigarettes, chewing tobacco, or electronic cigarettes.  Limit alcohol use even if you are not taking warfarin. Moderate alcohol use is considered to be:  No more than 2 drinks each day for men.  No more than 1 drink each day for nonpregnant women.  Home safety. A safe home environment is important to reduce the risk of falls. Your health care provider may arrange for specialists to evaluate your home. Having grab bars in the bedroom and bathroom is often important. Your health care provider may arrange for equipment to be used at home, such as raised toilets and a seat for the shower.  Physical, occupational, and speech therapy. Ongoing therapy may be needed to maximize your recovery after a stroke. If you have been advised to use a walker or a cane, use it at all times. Be sure to keep your therapy appointments.  Follow all instructions for follow-up with your health care provider. This is very important. This includes any referrals, physical therapy, rehabilitation, and lab tests. Proper follow-up can prevent another stroke from occurring. SEEK MEDICAL CARE IF:  You have personality changes.  You have difficulty swallowing.  You are seeing double.  You have dizziness.  You have a fever.  You have skin breakdown. SEEK IMMEDIATE MEDICAL CARE IF:  Any of these symptoms may represent a serious problem that is an emergency. Do not wait to see if the symptoms will go away. Get medical help right away. Call your local emergency services (911 in [place] Do not drive yourself to the hospital.  You have sudden weakness or numbness of the face, arm, or leg, especially on one side of the body.  You have sudden trouble walking or difficulty moving arms or legs.  You have sudden confusion.  You have trouble speaking (aphasia) or understanding.  You have sudden trouble seeing in one or both eyes.  You have a loss of  balance or coordination.  You have a sudden, severe headache with no known cause.  You have new chest pain or an irregular heartbeat.  You have a partial or total loss of consciousness. Document Released: 12/25/2004 Document Revised: 05/11/2013 Document Reviewed: 08/05/2011 [facility] Patient Information 2015 [place], [place]. This information is not intended to replace advice given to you by your health care provider. Make sure you discuss any questions you have with your health care provider.  STROKE/TIA DISCHARGE INSTRUCTIONS SMOKING Cigarette smoking nearly doubles your risk of having a stroke & is the single most alterable risk factor  If you smoke or have smoked in the last 12 months, you are advised to quit smoking for your health.  Most of the excess cardiovascular risk related to smoking disappears within a year of stopping.  Ask you doctor about anti-smoking medications  [place] Quit Line: 1-800-QUIT NOW  Free Smoking Cessation Classes [phone]  CHOLESTEROL Know your levels; limit fat & cholesterol in your diet  Lipid Panel     Component Value Date/Time   CHOL  254* 04/17/2014 1543   TRIG 276* 04/17/2014 1543   HDL 37* 04/17/2014 1543   CHOLHDL 6.9 04/17/2014 1543   VLDL 55* 04/17/2014 1543   LDLCALC 162* 04/17/2014 1543      Many patients benefit from treatment even if their cholesterol is at goal.  Goal: Total Cholesterol (CHOL) less than 160  Goal:  Triglycerides (TRIG) less than 150  Goal:  HDL greater than 40  Goal:  LDL (LDLCALC) less than 100   BLOOD PRESSURE American Stroke Association blood pressure target is less that 120/80 mm/Hg  Your discharge blood pressure is:  BP: (!) 130/59 mmHg  Monitor your blood pressure  Limit your salt and alcohol intake  Many individuals will require more than one medication for high blood pressure  DIABETES (A1c is a blood sugar average for last 3 months) Goal HGBA1c is under 7% (HBGA1c is blood sugar average for last 3  months)  Diabetes: No known diagnosis of diabetes    Lab Results  Component Value Date   HGBA1C 7.4* 07/06/2014     Your HGBA1c can be lowered with medications, healthy diet, and exercise.  Check your blood sugar as directed by your physician  Call your physician if you experience unexplained or low blood sugars.  PHYSICAL ACTIVITY/REHABILITATION Goal is 30 minutes at least 4 days per week  Activity: Increase activity slowly, Therapies: SNF   Activity decreases your risk of heart attack and stroke and makes your heart stronger.  It helps control your weight and blood pressure; helps you relax and can improve your mood.  Participate in a regular exercise program.  Talk with your doctor about the best form of exercise for you (dancing, walking, swimming, cycling).  DIET/WEIGHT Goal is to maintain a healthy weight  Your discharge diet is: DIET DYS 3 Room service appropriate?: Yes; Fluid consistency:: Thin liquids Your height is:  Height: 5\' 11"  (180.3 cm) Your current weight is: Weight: 69.5 kg (153 lb 3.5 oz) Your Body Mass Index (BMI) is:  BMI (Calculated): 21.6  Following the type of diet specifically designed for you will help prevent another stroke.  Your goal Body Mass Index (BMI) is 19-24.  Healthy food habits can help reduce 3 risk factors for stroke:  High cholesterol, hypertension, and excess weight.  RESOURCES Stroke/Support Group:  Call [phone]   STROKE EDUCATION PROVIDED/REVIEWED AND GIVEN TO PATIENT Stroke warning signs and symptoms How to activate emergency medical system (call 911). Medications prescribed at discharge. Need for follow-up after discharge. Personal risk factors for stroke. Pneumonia vaccine given: No Flu vaccine given: No My questions have been answered, the writing is legible, and I understand these instructions.  I will adhere to these goals & educational materials that have been provided to me after my discharge from the hospital.

## 2014-07-10 NOTE — Progress Notes (Signed)
Patient discharged to SNF via [facility] transportation

## 2014-07-16 ENCOUNTER — Encounter: Payer: Self-pay | Admitting: Internal Medicine

## 2014-07-23 ENCOUNTER — Ambulatory Visit: Payer: Medicare Other | Admitting: Neurology

## 2014-07-26 ENCOUNTER — Encounter: Payer: Self-pay | Admitting: Neurology

## 2014-08-04 ENCOUNTER — Ambulatory Visit: Payer: Medicare Other | Admitting: Neurology

## 2015-06-03 ENCOUNTER — Emergency Department (HOSPITAL_COMMUNITY): Payer: Medicare Other

## 2015-06-03 ENCOUNTER — Encounter (HOSPITAL_COMMUNITY): Payer: Self-pay | Admitting: *Deleted

## 2015-06-03 ENCOUNTER — Emergency Department (HOSPITAL_COMMUNITY)
Admission: EM | Admit: 2015-06-03 | Discharge: 2015-06-04 | Disposition: A | Discharge status: Home / Self Care | Payer: Medicare Other | Attending: Emergency Medicine | Admitting: Emergency Medicine

## 2015-06-03 DIAGNOSIS — E1142 Type 2 diabetes mellitus with diabetic polyneuropathy: Secondary | ICD-10-CM | POA: Insufficient documentation

## 2015-06-03 DIAGNOSIS — Y92002 Bathroom of unspecified non-institutional (private) residence single-family (private) house as the place of occurrence of the external cause: Secondary | ICD-10-CM | POA: Insufficient documentation

## 2015-06-03 DIAGNOSIS — Z85830 Personal history of malignant neoplasm of bone: Secondary | ICD-10-CM | POA: Diagnosis not present

## 2015-06-03 DIAGNOSIS — E785 Hyperlipidemia, unspecified: Secondary | ICD-10-CM | POA: Diagnosis not present

## 2015-06-03 DIAGNOSIS — W01198A Fall on same level from slipping, tripping and stumbling with subsequent striking against other object, initial encounter: Secondary | ICD-10-CM | POA: Diagnosis not present

## 2015-06-03 DIAGNOSIS — I10 Essential (primary) hypertension: Secondary | ICD-10-CM | POA: Diagnosis not present

## 2015-06-03 DIAGNOSIS — R55 Syncope and collapse: Secondary | ICD-10-CM | POA: Diagnosis present

## 2015-06-03 DIAGNOSIS — Z8673 Personal history of transient ischemic attack (TIA), and cerebral infarction without residual deficits: Secondary | ICD-10-CM | POA: Insufficient documentation

## 2015-06-03 DIAGNOSIS — Y998 Other external cause status: Secondary | ICD-10-CM | POA: Diagnosis not present

## 2015-06-03 DIAGNOSIS — R531 Weakness: Secondary | ICD-10-CM | POA: Diagnosis not present

## 2015-06-03 DIAGNOSIS — F0390 Unspecified dementia without behavioral disturbance: Secondary | ICD-10-CM | POA: Insufficient documentation

## 2015-06-03 DIAGNOSIS — R112 Nausea with vomiting, unspecified: Secondary | ICD-10-CM | POA: Insufficient documentation

## 2015-06-03 DIAGNOSIS — Z7982 Long term (current) use of aspirin: Secondary | ICD-10-CM | POA: Insufficient documentation

## 2015-06-03 DIAGNOSIS — Y9389 Activity, other specified: Secondary | ICD-10-CM | POA: Insufficient documentation

## 2015-06-03 DIAGNOSIS — S0502XA Injury of conjunctiva and corneal abrasion without foreign body, left eye, initial encounter: Secondary | ICD-10-CM

## 2015-06-03 DIAGNOSIS — Z79899 Other long term (current) drug therapy: Secondary | ICD-10-CM | POA: Diagnosis not present

## 2015-06-03 HISTORY — DX: Unspecified dementia, unspecified severity, without behavioral disturbance, psychotic disturbance, mood disturbance, and anxiety: F03.90

## 2015-06-03 LAB — COMPREHENSIVE METABOLIC PANEL
ALBUMIN: 3.8 g/dL (ref 3.5–5.0)
ALK PHOS: 59 U/L (ref 38–126)
ALT: 10 U/L — AB (ref 17–63)
ANION GAP: 6 (ref 5–15)
AST: 12 U/L — ABNORMAL LOW (ref 15–41)
BILIRUBIN TOTAL: 0.8 mg/dL (ref 0.3–1.2)
BUN: 23 mg/dL — ABNORMAL HIGH (ref 6–20)
CALCIUM: 9.7 mg/dL (ref 8.9–10.3)
CO2: 25 mmol/L (ref 22–32)
CREATININE: 1.77 mg/dL — AB (ref 0.61–1.24)
Chloride: 109 mmol/L (ref 101–111)
GFR calc Af Amer: 43 mL/min — ABNORMAL LOW (ref >60)
GFR calc non Af Amer: 37 mL/min — ABNORMAL LOW (ref >60)
GLUCOSE: 165 mg/dL — AB (ref 65–99)
Potassium: 5 mmol/L (ref 3.5–5.1)
SODIUM: 140 mmol/L (ref 135–145)
TOTAL PROTEIN: 6.6 g/dL (ref 6.5–8.1)

## 2015-06-03 LAB — CBC WITH DIFFERENTIAL/PLATELET
BASOS PCT: 0 %
Basophils Absolute: 0 10*3/uL (ref 0.0–0.1)
EOS ABS: 0.1 10*3/uL (ref 0.0–0.7)
EOS PCT: 1 %
HCT: 36.2 % — ABNORMAL LOW (ref 39.0–52.0)
Hemoglobin: 11.3 g/dL — ABNORMAL LOW (ref 13.0–17.0)
Lymphocytes Relative: 19 %
Lymphs Abs: 2.4 10*3/uL (ref 0.7–4.0)
MCH: 27 pg (ref 26.0–34.0)
MCHC: 31.2 g/dL (ref 30.0–36.0)
MCV: 86.4 fL (ref 78.0–100.0)
MONO ABS: 0.8 10*3/uL (ref 0.1–1.0)
MONOS PCT: 7 %
Neutro Abs: 9.4 10*3/uL — ABNORMAL HIGH (ref 1.7–7.7)
Neutrophils Relative %: 73 %
PLATELETS: 205 10*3/uL (ref 150–400)
RBC: 4.19 MIL/uL — ABNORMAL LOW (ref 4.22–5.81)
RDW: 12.7 % (ref 11.5–15.5)
WBC: 12.8 10*3/uL — ABNORMAL HIGH (ref 4.0–10.5)

## 2015-06-03 LAB — ETHANOL: Alcohol, Ethyl (B): <5 mg/dL (ref <5)

## 2015-06-03 LAB — I-STAT CG4 LACTIC ACID, ED: LACTIC ACID, VENOUS: 0.78 mmol/L (ref 0.5–2.0)

## 2015-06-03 LAB — I-STAT TROPONIN, ED: TROPONIN I, POC: 0 ng/mL (ref 0.00–0.08)

## 2015-06-03 MED ORDER — FLUORESCEIN SODIUM 1 MG OP STRP
1.0000 | ORAL_STRIP | Freq: Once | OPHTHALMIC | Status: AC
  Administered 2015-06-03: 1 via OPHTHALMIC
  Filled 2015-06-03: qty 1

## 2015-06-03 MED ORDER — PROPARACAINE HCL 0.5 % OP SOLN
1.0000 [drp] | Freq: Once | OPHTHALMIC | Status: AC
  Administered 2015-06-03: 1 [drp] via OPHTHALMIC
  Filled 2015-06-03: qty 15

## 2015-06-03 MED ORDER — ERYTHROMYCIN 5 MG/GM OP OINT
1.0000 "application " | TOPICAL_OINTMENT | Freq: Four times a day (QID) | OPHTHALMIC | Status: DC
  Administered 2015-06-03: 1 via OPHTHALMIC
  Filled 2015-06-03: qty 3.5

## 2015-06-03 MED ORDER — ONDANSETRON HCL 4 MG/2ML IJ SOLN
4.0000 mg | Freq: Once | INTRAMUSCULAR | Status: DC
  Filled 2015-06-03: qty 2

## 2015-06-03 NOTE — ED Notes (Signed)
Pt attempted to use urinal; insufficient amount collected for sample

## 2015-06-03 NOTE — ED Provider Notes (Signed)
CSN: [id number]     Arrival date & time 06/03/15  2025 History   First MD Initiated Contact with Patient 06/03/15 2116     Chief Complaint  Patient presents with  . Loss of Consciousness     (Consider location/radiation/quality/duration/timing/severity/associated sxs/prior Treatment) HPI Comments: 70 year old male with past medical history including CVA with left-sided weakness, hypertension, hyperlipidemia, type 2 diabetes mellitus, alcohol abuse presents with possible loss of consciousness. EMS reports that his SNF called them for a syncopal episode around 1700 while he was using the bathroom. They reported nausea and vomiting since the fall as well as increasing lethargy and weakness today. The patient states that he did not lose consciousness. He states that he was in the bathroom and slipped to his left side and was unable to straighten back up, which is why he hit the call button. He has had a hard time seeing out of his left eye because a few days ago he accidentally struck his eye with his thumb and it has been painful and tearing since then. It is more painful when he tries to keep it open. Running his nausea and vomiting, he had a few episodes of vomiting this evening from eating corn chowder. He has mild nausea currently but denies any abdominal pain or diarrhea. He states he has had decreased PO intake over the past few days because it has been hard to see his plate of food due to his left eye pain. No fevers, cough/cold symptoms, chest pain, or SOB.   Patient is a 70 y.o. male presenting with syncope. The history is provided by the patient and the EMS personnel.  Loss of Consciousness   Past Medical History  Diagnosis Date  . Type II diabetes mellitus ([place])   . 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 Diabetic peripheral neuropathy ([facility])     a. both feet  . TIA (transient ischemic attack)     a. 08/2010.  Marland Kitchen Hyperlipidemia   . Hypertension   . Stroke [facility])     a. 04/2014 multiple bateral  infarcts, presumed to be embolic;  b. 07/9388 Carotid U/S: 1-39% bilat ICA stenosis.  . H/O echocardiogram     a. 04/2014 Echo: EF 55-60%, no rwma.  Marland Kitchen ETOH abuse     a. 04/2014 18-24 beers q weekend.  . CVA (cerebral vascular accident) ([place])   . Dementia    Past Surgical History  Procedure Laterality Date  . Sarcoma removal    . Repair of r hand after trauma    . Loop recorder implant N/A 04/20/2014    Procedure: LOOP RECORDER IMPLANT;  Surgeon: Thompson Grayer, MD;  Location: [facility] CATH LAB;  Service: Cardiovascular;  Laterality: N/A;  . Tee without cardioversion N/A 04/20/2014    Procedure: TRANSESOPHAGEAL ECHOCARDIOGRAM (TEE);  Surgeon: Larey Dresser, MD;  Location: [facility] ENDOSCOPY;  Service: Cardiovascular;  Laterality: N/A;   Family History  Problem Relation Age of Onset  . Cancer Mother   . Cancer Father   . Diabetes Brother    Social History  Substance Use Topics  . Smoking status: Never Smoker   . Smokeless tobacco: Never Used  . Alcohol Use: 3.6 oz/week    6 Standard drinks or equivalent per week     Comment: 18-24 beers every weekend.    Review of Systems  Cardiovascular: Positive for syncope.   10 Systems reviewed and are negative for acute change except as noted in the HPI.  Allergies  Review of patient's allergies indicates no known allergies.  Home Medications   Prior to Admission medications   Medication Sig Start Date End Date Taking? Authorizing Provider  acetaminophen (TYLENOL) 325 MG tablet Take 2 tablets (650 mg total) by mouth every 6 (six) hours as needed for mild pain (or Fever >/= 101). 07/10/14  Yes Bonnielee Haff, MD  aspirin 325 MG tablet Take 1 tablet (325 mg total) by mouth daily. 07/10/14  Yes Bonnielee Haff, MD  atorvastatin (LIPITOR) 40 MG tablet Take 1 tablet (40 mg total) by mouth daily at 6 PM. 04/21/14  Yes Thurnell Lose, MD  bisacodyl (DULCOLAX) 10 MG suppository Place 1 suppository (10 mg total) rectally daily as needed for moderate  constipation. 07/10/14  Yes Bonnielee Haff, MD  Blood Glucose Monitoring Suppl (RELION CONFIRM GLUCOSE MONITOR) W/DEVICE KIT Use as instructed. 12/14/11  Yes Hoyt Koch, MD  carvedilol (COREG) 6.25 MG tablet Take 1 tablet (6.25 mg total) by mouth 2 (two) times daily with a meal. 04/21/14  Yes Thurnell Lose, MD  clopidogrel (PLAVIX) 75 MG tablet Take 1 tablet (75 mg total) by mouth daily. 04/21/14  Yes Thurnell Lose, MD  docusate sodium (COLACE) 100 MG capsule Take 1 capsule (100 mg total) by mouth 2 (two) times daily. 07/10/14  Yes Bonnielee Haff, MD  famotidine (PEPCID) 20 MG tablet Take 1 tablet (20 mg total) by mouth daily. 07/10/14  Yes Bonnielee Haff, MD  glimepiride (AMARYL) 1 MG tablet Take 1 mg by mouth daily with breakfast.   Yes Historical Provider, MD  isosorbide-hydrALAZINE (BIDIL) 20-37.5 MG per tablet Take 1 tablet by mouth 2 (two) times daily. 07/10/14  Yes Bonnielee Haff, MD  loperamide (IMODIUM) 2 MG capsule Take 2 mg by mouth as needed for diarrhea or loose stools.   Yes Historical Provider, MD  pregabalin (LYRICA) 50 MG capsule Take 1 capsule (50 mg total) by mouth 2 (two) times daily. 11/21/11  Yes Hoyt Koch, MD  tamsulosin (FLOMAX) 0.4 MG CAPS capsule Take 0.4 mg by mouth daily.   Yes Historical Provider, MD  tobramycin (TOBREX) 0.3 % ophthalmic solution Place 2 drops into the left eye 3 (three) times daily. For 5 days ending 06-07-15 06/02/15  Yes Historical Provider, MD  Vitamin D, Ergocalciferol, (DRISDOL) 50000 units CAPS capsule Take 50,000 Units by mouth every Tuesday.   Yes Historical Provider, MD  sulfamethoxazole-trimethoprim (BACTRIM DS,SEPTRA DS) 800-160 MG per tablet Take 1 tablet by mouth 2 (two) times daily. For 5 more days. Stop after last dose on 07/14/14. Patient not taking: Reported on 06/03/2015 07/10/14   Bonnielee Haff, MD   BP 143/98 mmHg  Pulse 66  Temp(Src) 97.9 F (36.6 C)  Resp 17  SpO2 100% Physical Exam  Constitutional: He is oriented  to person, place, and time. No distress.  Frail, thin, disheveled  HENT:  Head: Normocephalic and atraumatic.  Nose: Nose normal.  Moist mucous membranes  Eyes: EOM are normal. Pupils are equal, round, and reactive to light.    L conjunctival injection w/ tearing; small linear corneal abrasion on central L eye below pupil  Neck: Neck supple.  Cardiovascular: Normal rate, regular rhythm and normal heart sounds.   No murmur heard. Pulmonary/Chest: Effort normal and breath sounds normal.  Abdominal: Soft. Bowel sounds are normal. He exhibits no distension. There is no tenderness.  Musculoskeletal: He exhibits no edema.  BLE muscle atrophy w/ scar on R lower leg  Neurological: He is alert and  oriented to person, place, and time. No cranial nerve deficit.  Fluent speech, moving all 4 ext, 4+/5 strength L side, 5/5 strength R side  Skin: Skin is warm and dry.  Nursing note and vitals reviewed.   ED Course  Procedures (including critical care time) Labs Review Labs Reviewed  COMPREHENSIVE METABOLIC PANEL - Abnormal; Notable for the following:    Glucose, Bld 165 (*)    BUN 23 (*)    Creatinine, Ser 1.77 (*)    AST 12 (*)    ALT 10 (*)    GFR calc non Af Amer 37 (*)    GFR calc Af Amer 43 (*)    All other components within normal limits  CBC WITH DIFFERENTIAL/PLATELET - Abnormal; Notable for the following:    WBC 12.8 (*)    RBC 4.19 (*)    Hemoglobin 11.3 (*)    HCT 36.2 (*)    Neutro Abs 9.4 (*)    All other components within normal limits  ETHANOL  URINALYSIS, ROUTINE W REFLEX MICROSCOPIC (NOT AT [facility])  I-STAT CG4 LACTIC ACID, ED  Randolm Idol, ED    Imaging Review Dg Chest 2 View  06/03/2015  CLINICAL DATA:  Syncopal episode.  Increased lethargy.  Weakness. EXAM: CHEST  2 VIEW COMPARISON:  07/06/2014 FINDINGS: There is no focal parenchymal opacity. There is no pleural effusion or pneumothorax. The heart and mediastinal contours are unremarkable. There is an  implantable loop recorder in the left anterior chest wall. The osseous structures are unremarkable. IMPRESSION: No active cardiopulmonary disease. Electronically Signed   By: Kathreen Devoid   On: 06/03/2015 21:57   I have personally reviewed and evaluated these lab results as part of my medical decision-making.   EKG Interpretation   Date/Time:  Friday Jun 03 2015 20:30:46 EDT Ventricular Rate:  66 PR Interval:  202 QRS Duration: 139 QT Interval:  433 QTC Calculation: 454 R Axis:   -82 Text Interpretation:  Sinus rhythm Right bundle branch block No  significant change since last tracing Confirmed by Alexcia Schools MD, Tymeir Weathington  [id number]) on 06/03/2015 9:07:14 PM     Medications  erythromycin ophthalmic ointment 1 application (1 application Left Eye Given 06/03/15 2334)  ondansetron (ZOFRAN) injection 4 mg (0 mg Intravenous Hold 06/03/15 2334)  proparacaine (ALCAINE) 0.5 % ophthalmic solution 1 drop (1 drop Both Eyes Given by Other 06/03/15 2333)  fluorescein ophthalmic strip 1 strip (1 strip Left Eye Given 06/03/15 2334)    MDM   Final diagnoses:  None   He should brought in from nursing facility for a report of syncopal event, however the patient himself denies any episode of syncope or loss of consciousness. He recalls that he was in the bathroom and slumped over to his left side because his left side is chronically weaker from his stroke. He states he was awake the entire time and did not strike his head. On exam, he was awake and alert, comfortable with normal vital signs. No abdominal tenderness. He had conjunctival injection and tearing of his left eye with a corneal abrasion noted on fluorescein staining. He felt relief with proparicane drops. Provided with erythromycin ointment for corneal abrasion. Gave him Zofran after which PO challenged.  Chest x-ray negative acute. EKG shows right bundle branch block which is similar to previous EKG. Labwork shows negative troponin, normal lactate,  creatinine 1.77 which is similar to previous labs, glucose 165, WBC 12.8, hemoglobin 11.3. I have asked multiple times regarding the patient's episode and  he is continued to deny any syncope. Therefore I do not feel he needs any further workup at this time. He has known left-sided weakness from previous stroke. No abdominal tenderness or distension to suggest intraabdominal pathology. He recalls details of event and denies head injury therefore I did not obtain any head imaging.   Patient is drinking Coke without any problems and has had no vomiting in the ED. He continues to deny any complaints. We are waiting UA and if negative, I anticipate discharge home as the patient continues to deny any syncopal event. I have discussed erythromycin treatment for corneal abrasion as well as return precautions. I am signing out to oncoming provider who will f/u UA.   Sharlett Iles, MD 06/04/15 [phone]

## 2015-06-03 NOTE — ED Notes (Signed)
Pt to ED by EMS from [facility] c/o syncopal episode at 1700 while using the bathroom. Staff reports NV since fall, has had increased lethargy and weakness today. At baseline pt has L sided deficits

## 2015-06-03 NOTE — ED Notes (Signed)
Pt is aware he needs a urine sample 

## 2015-06-03 NOTE — ED Notes (Signed)
Pt given Coke to drink for PO challenge; tolerating liquids at this time

## 2015-06-04 DIAGNOSIS — S0502XA Injury of conjunctiva and corneal abrasion without foreign body, left eye, initial encounter: Secondary | ICD-10-CM | POA: Diagnosis not present

## 2015-06-04 LAB — URINE MICROSCOPIC-ADD ON
RBC / HPF: NONE SEEN RBC/hpf (ref 0–5)
WBC UA: NONE SEEN WBC/hpf (ref 0–5)

## 2015-06-04 LAB — URINALYSIS, ROUTINE W REFLEX MICROSCOPIC
Bilirubin Urine: NEGATIVE
GLUCOSE, UA: NEGATIVE mg/dL
Hgb urine dipstick: NEGATIVE
Ketones, ur: NEGATIVE mg/dL
LEUKOCYTES UA: NEGATIVE
NITRITE: NEGATIVE
PH: 5.5 (ref 5.0–8.0)
Protein, ur: 30 mg/dL — AB
SPECIFIC GRAVITY, URINE: 1.017 (ref 1.005–1.030)

## 2015-06-04 NOTE — ED Notes (Signed)
[facility] at bedside for transport.  

## 2015-06-04 NOTE — ED Notes (Signed)
Pt given additional Coke to drink. Pt states unable to urinate right now; urinal at bedside

## 2015-06-04 NOTE — ED Notes (Signed)
Pt given additional Coke to drink per request

## 2015-06-04 NOTE — ED Notes (Signed)
Pt still unable to give a urine sample

## 2015-06-04 NOTE — ED Notes (Signed)
Informed pt that in and out cath would be done for urinalysis. Pt requesting to use urinal again to provide sample. Will cath if pt unable to urinate

## 2015-06-04 NOTE — Discharge Instructions (Signed)
Place a small ribbon of the erythromycin eye ointment inside your bottom left eyelid, close eye and move around. Apply medicine 4 times per day for 5 days.  Corneal Abrasion The cornea is the clear covering at the front and center of the eye. When looking at the colored portion of the eye (iris), you are looking through the cornea. This very thin tissue is made up of many layers. The surface layer is a single layer of cells (corneal epithelium) and is one of the most sensitive tissues in the body. If a scratch or injury causes the corneal epithelium to come off, it is called a corneal abrasion. If the injury extends to the tissues below the epithelium, the condition is called a corneal ulcer. CAUSES   Scratches.  Trauma.  Foreign body in the eye. Some people have recurrences of abrasions in the area of the original injury even after it has healed (recurrent erosion syndrome). Recurrent erosion syndrome generally improves and goes away with time. SYMPTOMS   Eye pain.  Difficulty or inability to keep the injured eye open.  The eye becomes very sensitive to light.  Recurrent erosions tend to happen suddenly, first thing in the morning, usually after waking up and opening the eye. DIAGNOSIS  Your health care provider can diagnose a corneal abrasion during an eye exam. Dye is usually placed in the eye using a drop or a small paper strip moistened by your tears. When the eye is examined with a special light, the abrasion shows up clearly because of the dye. TREATMENT   Small abrasions may be treated with antibiotic drops or ointment alone.  A pressure patch may be put over the eye. If this is done, follow your doctor's instructions for when to remove the patch. Do not drive or use machines while the eye patch is on. Judging distances is hard to do with a patch on. If the abrasion becomes infected and spreads to the deeper tissues of the cornea, a corneal ulcer can result. This is serious  because it can cause corneal scarring. Corneal scars interfere with light passing through the cornea and cause a loss of vision in the involved eye. HOME CARE INSTRUCTIONS  Use medicine or ointment as directed. Only take over-the-counter or prescription medicines for pain, discomfort, or fever as directed by your health care provider.  Do not drive or operate machinery if your eye is patched. Your ability to judge distances is impaired.  If your health care provider has given you a follow-up appointment, it is very important to keep that appointment. Not keeping the appointment could result in a severe eye infection or permanent loss of vision. If there is any problem keeping the appointment, let your health care provider know. SEEK MEDICAL CARE IF:   You have pain, light sensitivity, and a scratchy feeling in one eye or both eyes.  Your pressure patch keeps loosening up, and you can blink your eye under the patch after treatment.  Any kind of discharge develops from the eye after treatment or if the lids stick together in the morning.  You have the same symptoms in the morning as you did with the original abrasion days, weeks, or months after the abrasion healed.   This information is not intended to replace advice given to you by your health care provider. Make sure you discuss any questions you have with your health care provider.   Document Released: 12/23/1999 Document Revised: 09/15/2014 Document Reviewed: 09/01/2012 Elsevier Interactive Patient Education  Education ©2016 [employer] ° °

## 2016-08-11 IMAGING — CT CT HEAD W/O CM
1 of 2 series · 15 of 30 positions shown, 19 images · non-contrast
Comparison: CT 08/07/2010 as well as MRI 08/07/2010.

CLINICAL DATA: Right-sided weakness with slurred speech.

EXAM:
CT HEAD WITHOUT CONTRAST
TECHNIQUE: Contiguous axial images were obtained from the base of the skull
through the vertex without intravenous contrast.

[Series 2: head 5.0 h30s · axial · 0.42mm/px · z∈[-92,+38]mm · 15 of 30 slices shown, 19 images]
[im 2/30  brain]
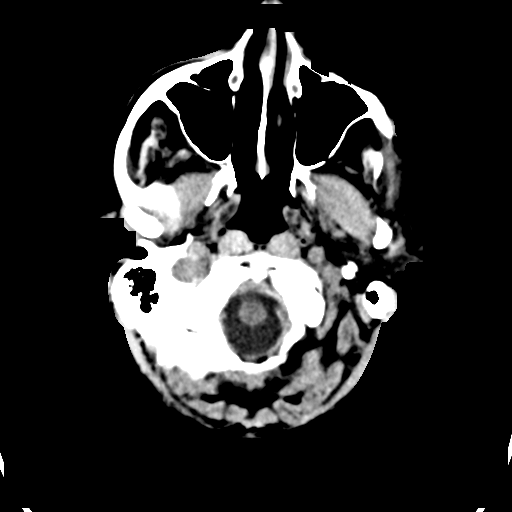
[im 2/30  bone]
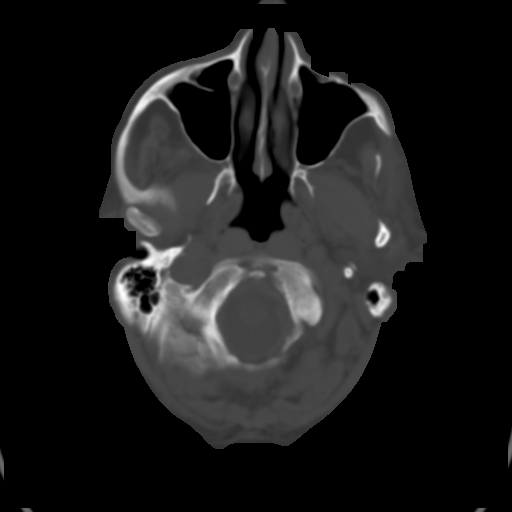
[im 4/30  brain]
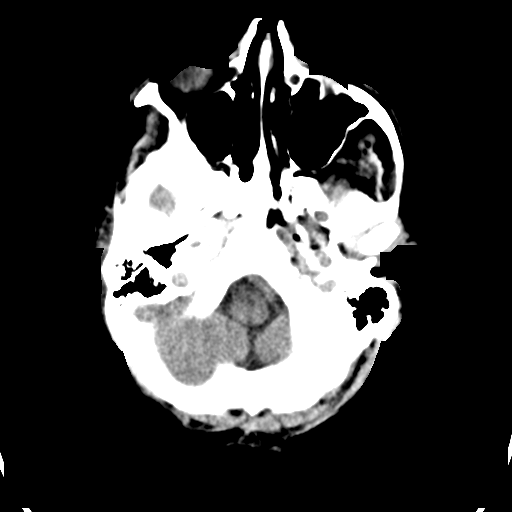
[im 6/30  brain]
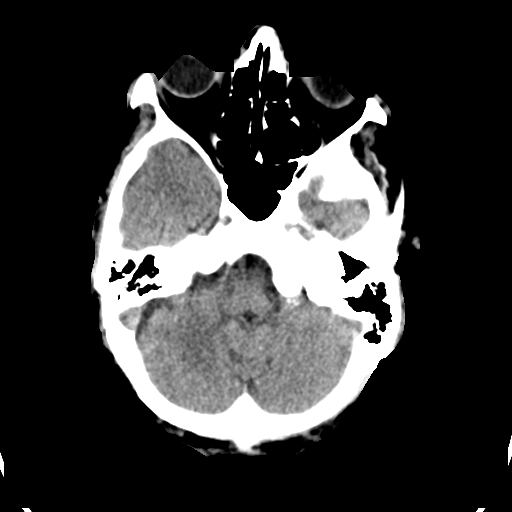
[im 7/30  brain]
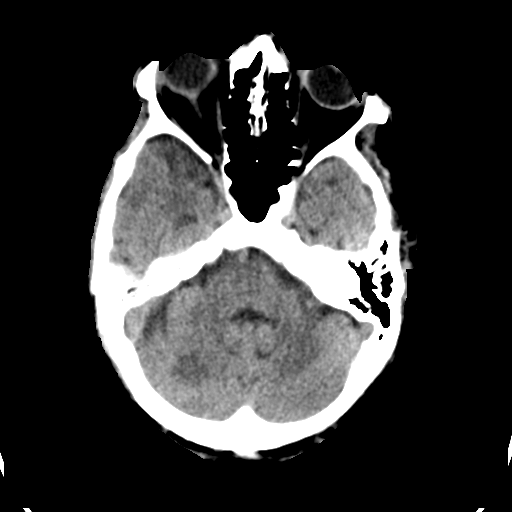
[im 9/30  brain]
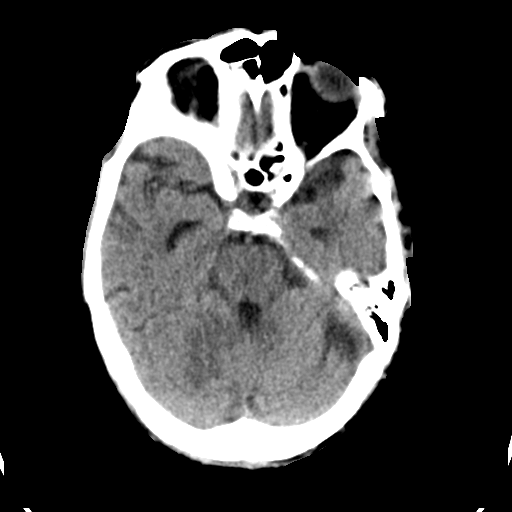
[im 9/30  bone]
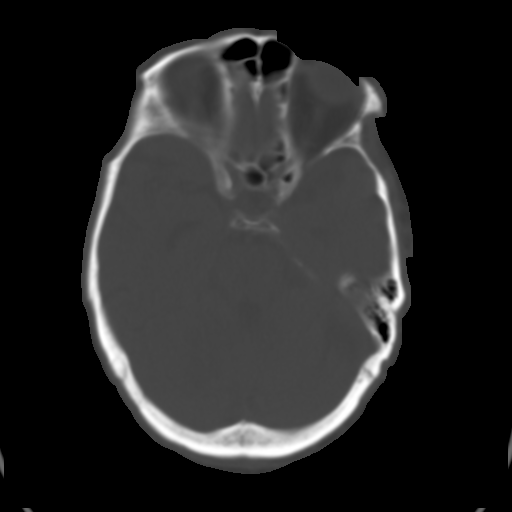
[im 11/30  brain]
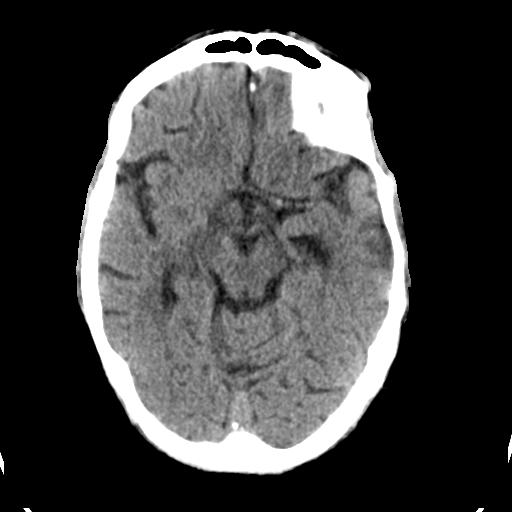
[im 12/30  brain]
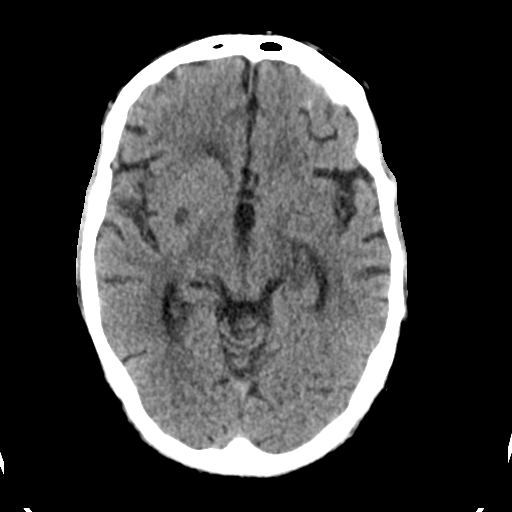
[im 16/30  brain]
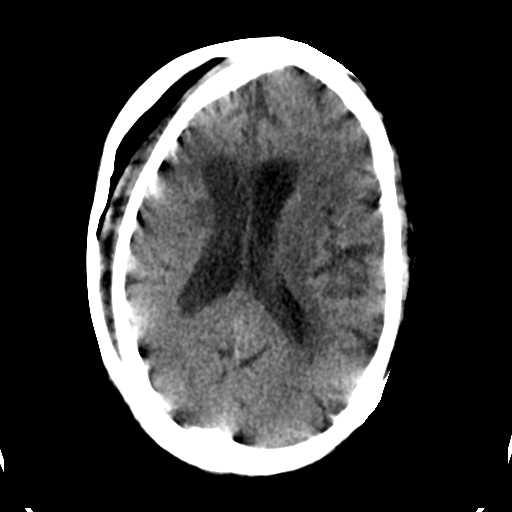
[im 18/30  brain]
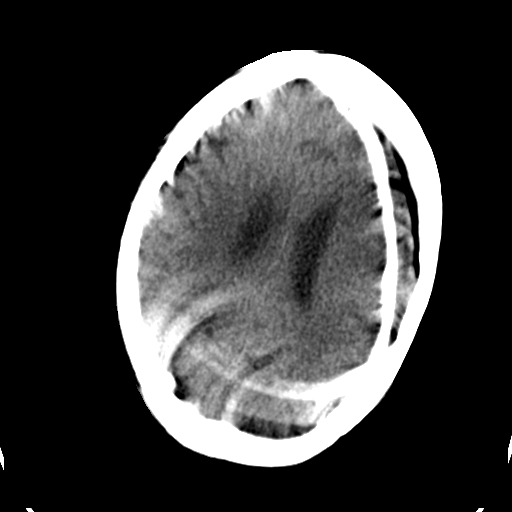
[im 18/30  bone]
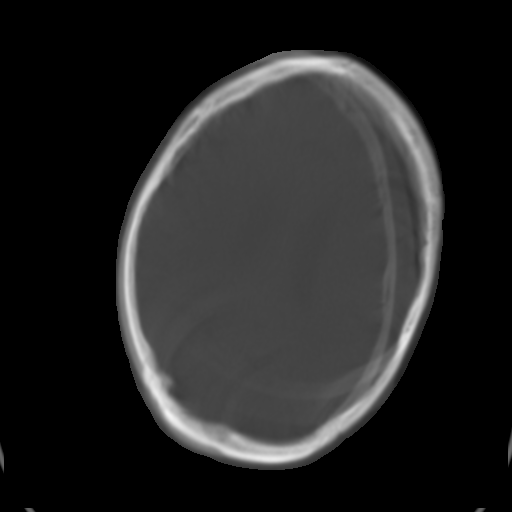
[im 19/30  brain]
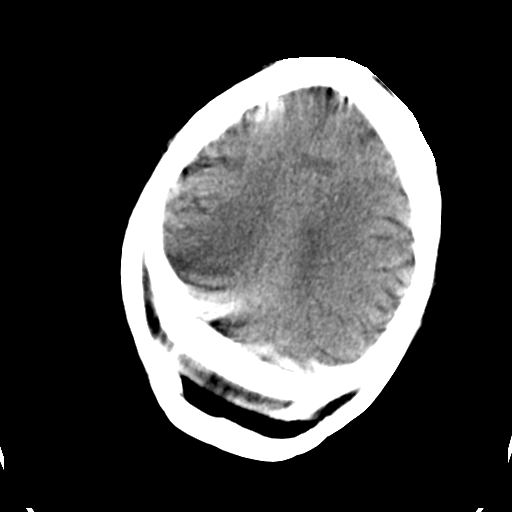
[im 21/30  brain]
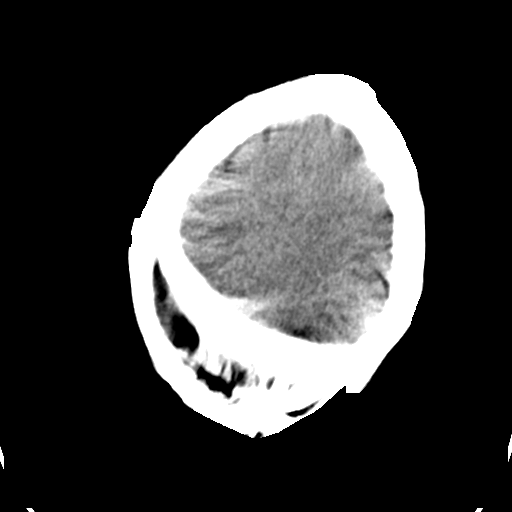
[im 23/30  brain]
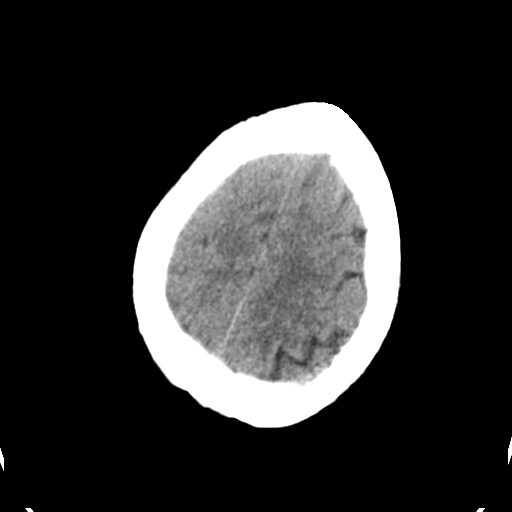
[im 24/30  brain]
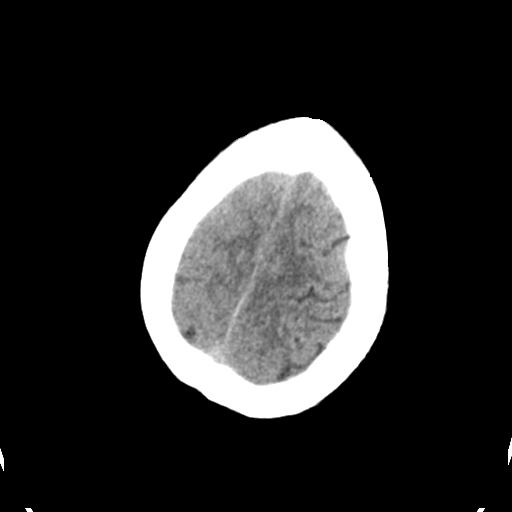
[im 24/30  bone]
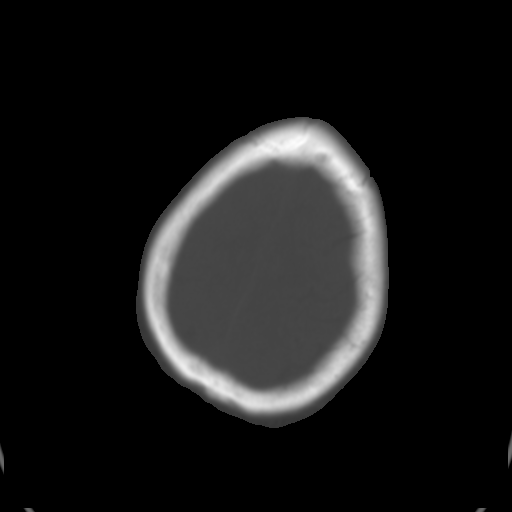
[im 26/30  brain]
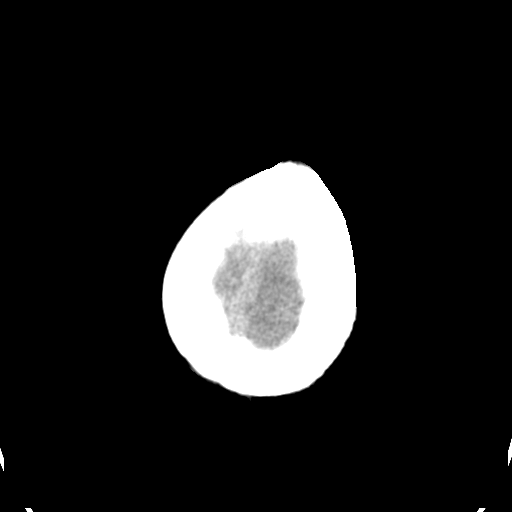
[im 28/30  brain]
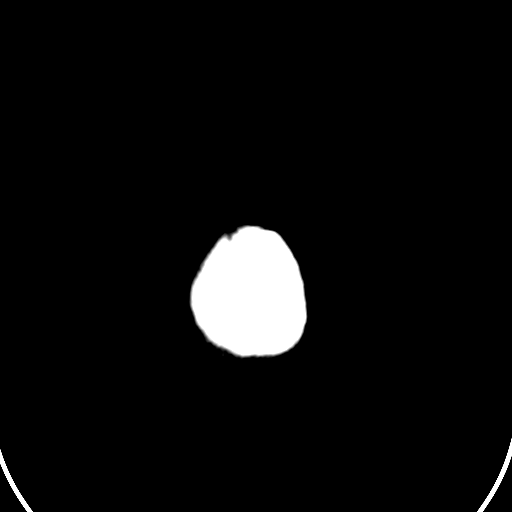

[15 of 30 positions shown; findings below may reference images not displayed]

FINDINGS: Ventricles, cisterns and other CSF spaces are within normal. There
is mild chronic ischemic microvascular disease. There are several
small old lacune infarct over the base a ganglia bilaterally. There
is no oval 1.7 cm hypodensity extending from the region of the right
head of caudate nucleus to the lentiform nucleus which may be a
subacute to chronic focal infarct. There is no focal mass, mass
effect or shift of midline structures. There is no evidence of acute
hemorrhage. Remaining bony and soft tissue structures are within
normal.
IMPRESSION: 1.7 cm focal hypodensity over the right basal ganglia/internal
capsule which may be a subacute to chronic infarct. Several other
smaller bilateral old basal ganglia lacunar infarcts.

Chronic ischemic microvascular disease.

## 2016-08-11 IMAGING — MR MR HEAD W/O CM
9 of 11 series · 32 of 48 positions shown · non-contrast
Comparison: 04/16/2014 head CT.  08/07/2010 brain MR.

CLINICAL DATA: 68-year-old diabetic male with prior TIA now
presenting with slurred speech and right-sided weakness for 2 weeks.
History sarcoma. Initial encounter.

EXAM:
MRI HEAD WITHOUT CONTRAST
TECHNIQUE: Multiplanar, multiecho pulse sequences of the brain and surrounding
structures were obtained without intravenous contrast.

[Series 3: FLAIR · sagittal · 5.0mm · 0.47mm/px · 1 of 23 slices shown (1 of 2)]
[im 1/23]
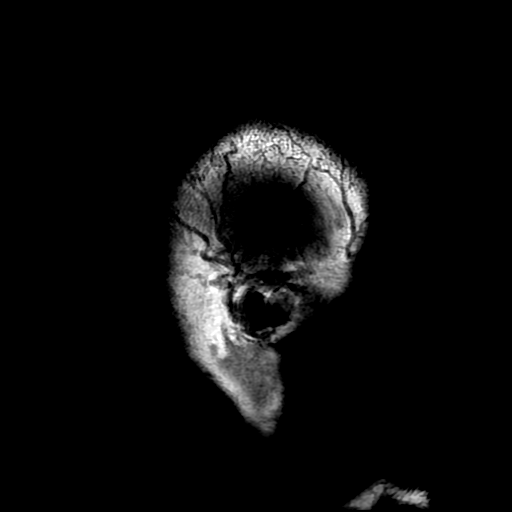

[Series 4: DWI · axial · 3.0mm · 0.94mm/px · z∈[-81,+61]mm · 8 of 100 slices shown (1 of 4)]
[im 1/100]
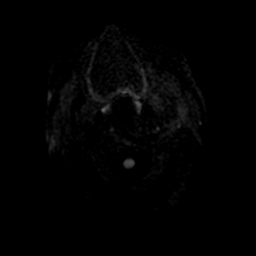
[im 15/100]
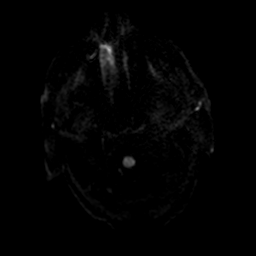
[im 29/100]
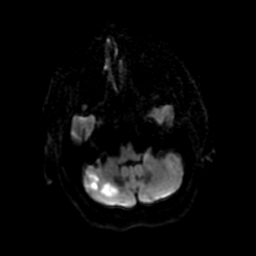
[im 43/100]
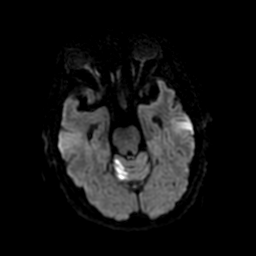
[im 57/100]
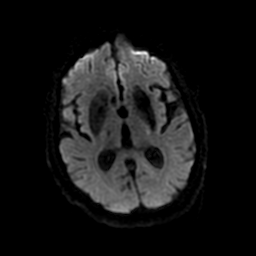
[im 71/100]
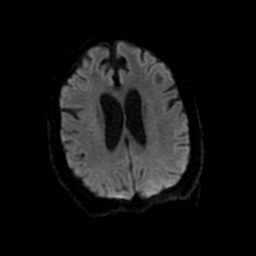
[im 85/100]
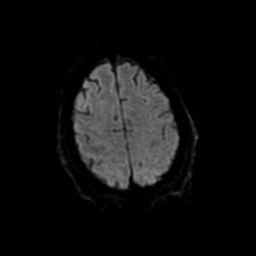
[im 100/100]
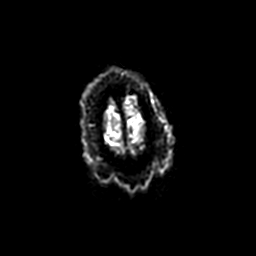

[Series 5: T2 · axial · 5.0mm · 0.47mm/px · z∈[-82,+70]mm · 2 of 27 slices shown (1 of 2)]
[im 1/27]
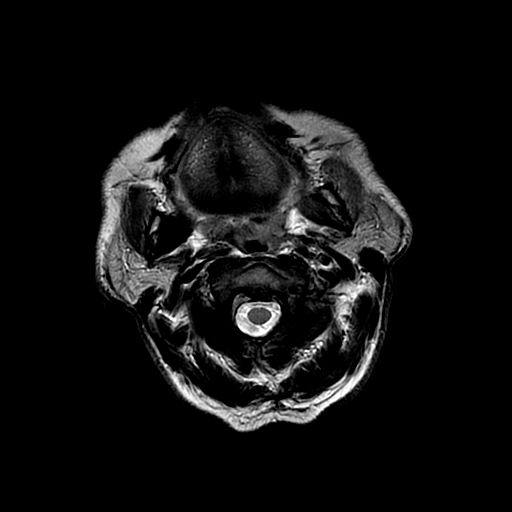
[im 27/27]
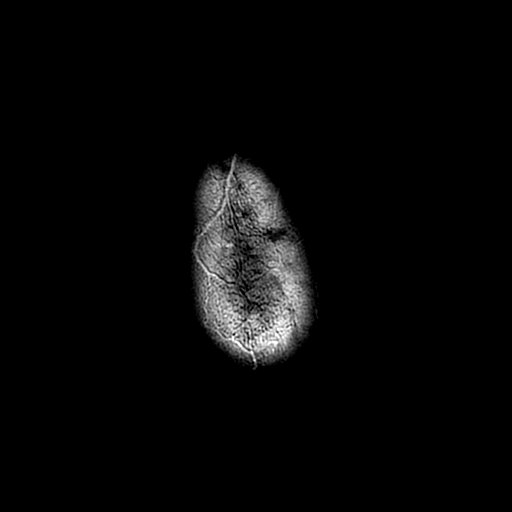

[Series 6: FLAIR · axial · 5.0mm · 0.47mm/px · z∈[-82,+70]mm · 2 of 27 slices shown (2 of 2)]
[im 1/27]
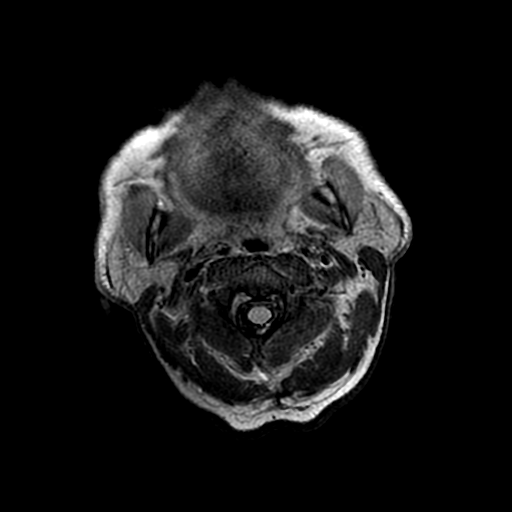
[im 27/27]
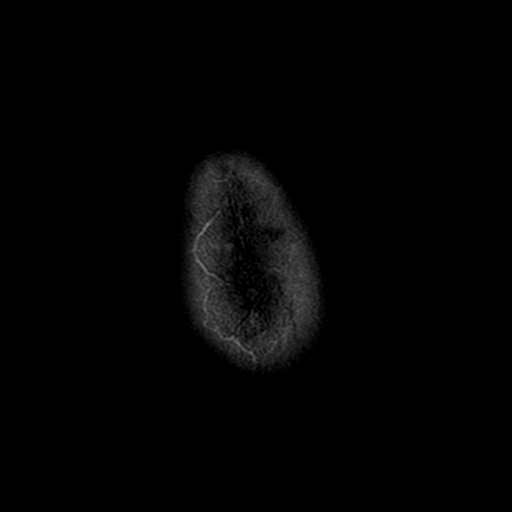

[Series 7: DWI · coronal · 5.0mm · 0.94mm/px · 5 of 72 slices shown (2 of 4)]
[im 1/72]
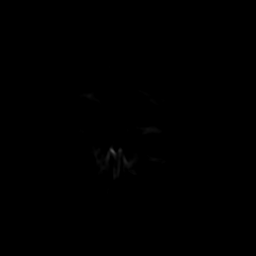
[im 18/72]
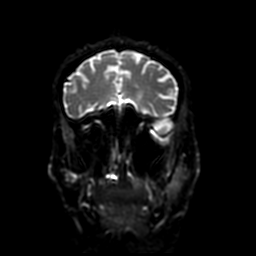
[im 36/72]
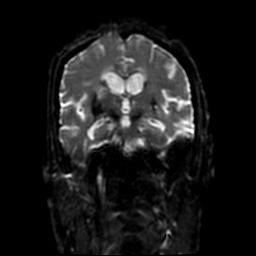
[im 54/72]
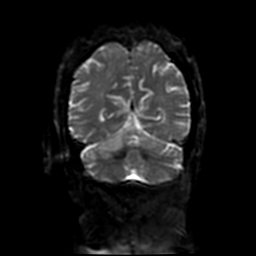
[im 72/72]
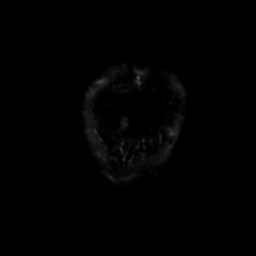

[Series 8: (person_name) · axial · 3.0mm · 0.47mm/px · z∈[-81,-0]mm · 5 of 100 slices shown]
[im 1/100]
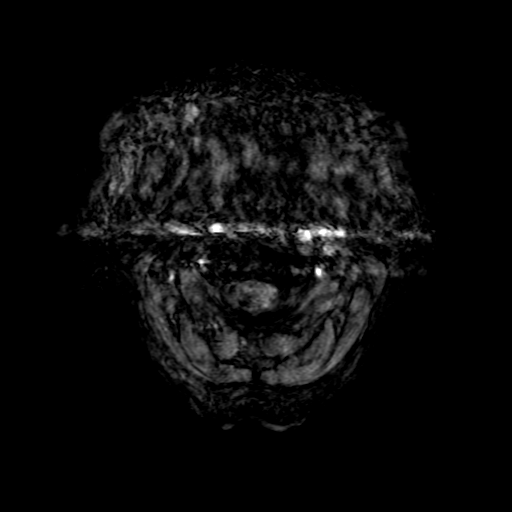
[im 15/100]
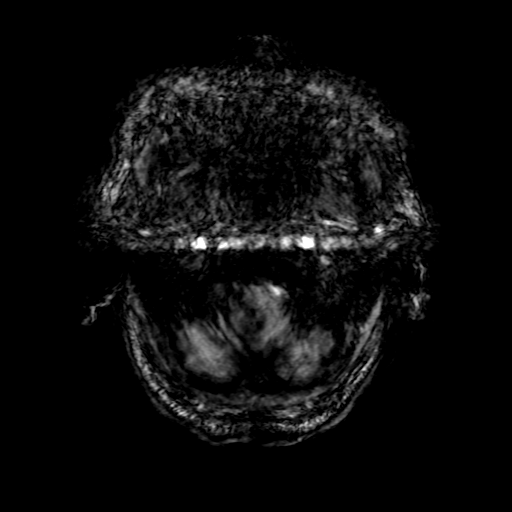
[im 29/100]
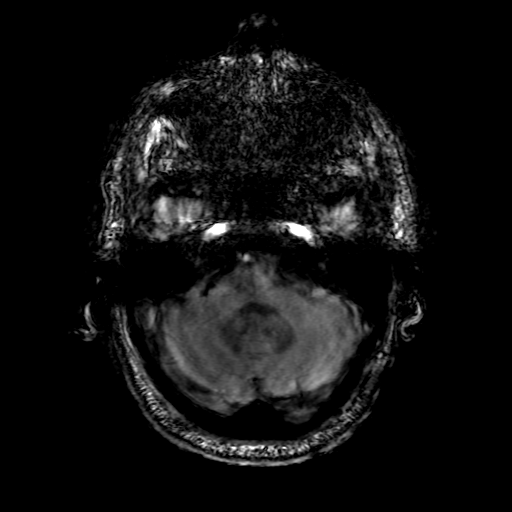
[im 43/100]
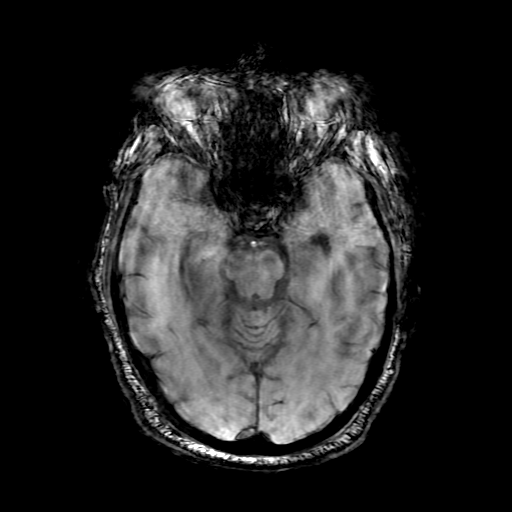
[im 57/100]
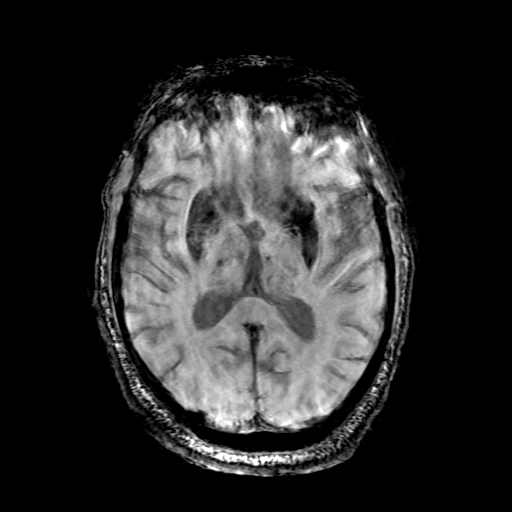

[Series 10: T2 · coronal · 5.0mm · 0.47mm/px · 2 of 30 slices shown (2 of 2)]
[im 1/30]
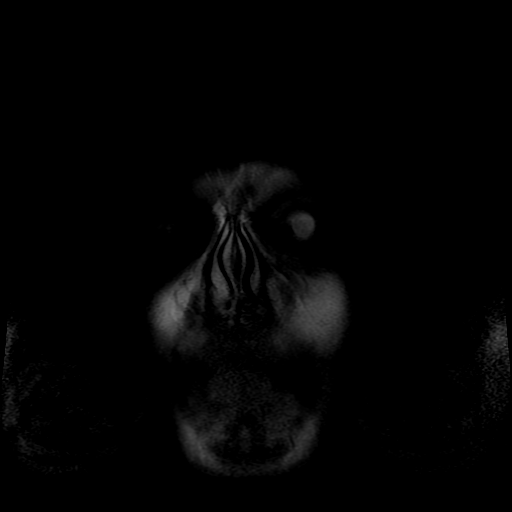
[im 30/30]
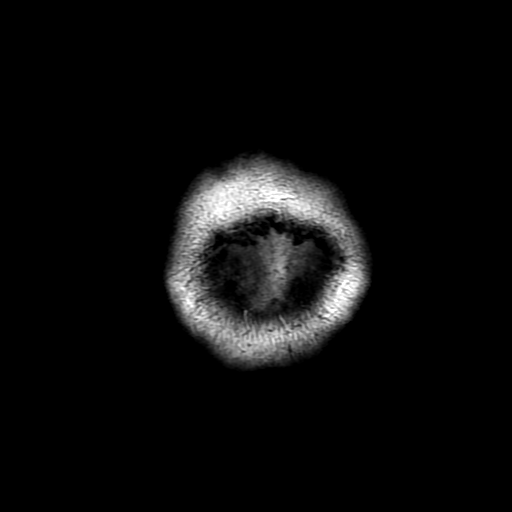

[Series 400: DWI · axial · 3.0mm · 0.94mm/px · z∈[-81,+61]mm · 4 of 50 slices shown (3 of 4)]
[im 1/50]
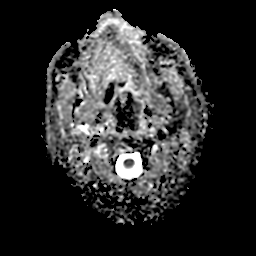
[im 17/50]
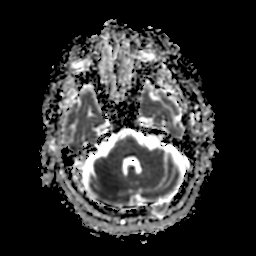
[im 33/50]
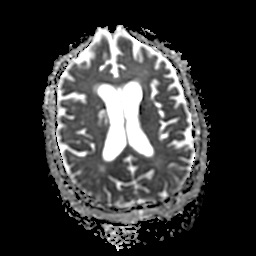
[im 50/50]
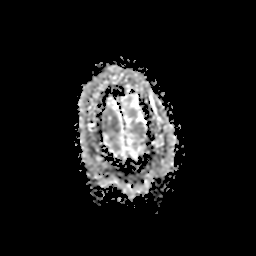

[Series 700: DWI · coronal · 5.0mm · 0.94mm/px · 3 of 36 slices shown (4 of 4)]
[im 1/36]
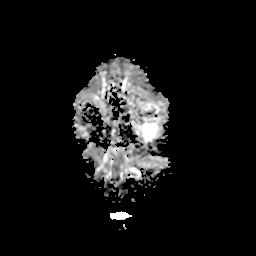
[im 18/36]
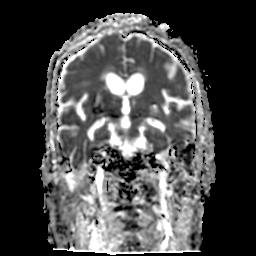
[im 36/36]
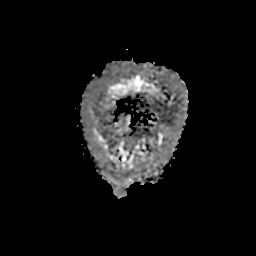

[32 of 48 positions shown; findings below may reference images not displayed]

FINDINGS: Exam is motion degraded.

Moderate-size acute/ subacute nonhemorrhagic infarct mid to superior
right cerebellar with local mass effect.

Tiny acute nonhemorrhagic posterior right operculum infarct.

Subacute to remote partially hemorrhagic infarct right lenticular
nucleus/mid right coronal radiata.

Remote bilateral thalamic infarcts.

Remote infarct anterior left external capsule.

Partially hemorrhagic remote left caudate/anterior limb left duct
internal capsule anterior left lenticular nucleus infarct.

Moderate small vessel disease type changes.

Global atrophy without hydrocephalus.

No intracranial mass lesion noted on this unenhanced exam.

Small right vertebral artery may end predominantly in a posterior
inferior cerebellar artery distribution. Left vertebral artery,
basilar artery and internal carotid arteries appear patent.

Polypoid opacification inferior aspect right maxillary sinus per
IMPRESSION: Moderate-size acute/ subacute nonhemorrhagic infarct mid to superior
right cerebellar with local mass effect.

Tiny acute nonhemorrhagic posterior right operculum infarct.

Subacute to remote partially hemorrhagic infarct right lenticular
nucleus/mid right coronal radiata.

Remote bilateral thalamic infarcts.

Remote infarct anterior left external capsule.

Partially hemorrhagic remote left caudate/anterior limb left duct
internal capsule anterior left lenticular nucleus infarct.

Moderate small vessel disease type changes.

Small right vertebral artery may end predominantly in a posterior
inferior cerebellar artery distribution. Left vertebral artery,
basilar artery and internal carotid arteries appear patent.

## 2017-08-21 ENCOUNTER — Encounter: Payer: Self-pay | Admitting: Internal Medicine

## 2020-06-03 ENCOUNTER — Other Ambulatory Visit: Payer: Self-pay

## 2020-06-03 ENCOUNTER — Encounter (HOSPITAL_COMMUNITY): Payer: Self-pay

## 2020-06-03 ENCOUNTER — Emergency Department (HOSPITAL_COMMUNITY)
Admission: EM | Admit: 2020-06-03 | Discharge: 2020-06-03 | Disposition: A | Discharge status: Home / Self Care | Payer: Medicare Other | Attending: Emergency Medicine | Admitting: Emergency Medicine

## 2020-06-03 ENCOUNTER — Emergency Department (HOSPITAL_COMMUNITY): Payer: Medicare Other

## 2020-06-03 DIAGNOSIS — W19XXXA Unspecified fall, initial encounter: Secondary | ICD-10-CM

## 2020-06-03 DIAGNOSIS — W050XXA Fall from non-moving wheelchair, initial encounter: Secondary | ICD-10-CM | POA: Diagnosis not present

## 2020-06-03 DIAGNOSIS — E1142 Type 2 diabetes mellitus with diabetic polyneuropathy: Secondary | ICD-10-CM | POA: Insufficient documentation

## 2020-06-03 DIAGNOSIS — S0990XA Unspecified injury of head, initial encounter: Secondary | ICD-10-CM | POA: Diagnosis not present

## 2020-06-03 DIAGNOSIS — Z23 Encounter for immunization: Secondary | ICD-10-CM | POA: Insufficient documentation

## 2020-06-03 DIAGNOSIS — F0390 Unspecified dementia without behavioral disturbance: Secondary | ICD-10-CM | POA: Insufficient documentation

## 2020-06-03 DIAGNOSIS — I10 Essential (primary) hypertension: Secondary | ICD-10-CM | POA: Insufficient documentation

## 2020-06-03 DIAGNOSIS — Y92129 Unspecified place in nursing home as the place of occurrence of the external cause: Secondary | ICD-10-CM | POA: Diagnosis not present

## 2020-06-03 DIAGNOSIS — E1149 Type 2 diabetes mellitus with other diabetic neurological complication: Secondary | ICD-10-CM | POA: Insufficient documentation

## 2020-06-03 MED ORDER — TETANUS-DIPHTH-ACELL PERTUSSIS 5-2.5-18.5 LF-MCG/0.5 IM SUSY
0.5000 mL | PREFILLED_SYRINGE | Freq: Once | INTRAMUSCULAR | Status: AC
  Administered 2020-06-03: 0.5 mL via INTRAMUSCULAR
  Filled 2020-06-03: qty 0.5

## 2020-06-03 NOTE — Discharge Instructions (Signed)
You were seen in the emergency department today with heart injury after a fall.  The CT scan of your head and neck did not show any fractures or bleeding.  You have a small wound on your forehead which will heal by itself.  Please keep this area clean and dry.  Follow-up with your primary care doctor in the coming week.  Return to the emergency department with any new or suddenly worsening symptoms.

## 2020-06-03 NOTE — ED Provider Notes (Signed)
Emergency Department Provider Note   I have reviewed the triage vital signs and the nursing notes.   HISTORY  Chief Complaint Fall   HPI Hayden Mckinney is a 75 y.o. male with past medical history of dementia along with prior stroke presents to the emergency department by EMS from [facility] after a witnessed fall. Patient was standing from his wheelchair and lost his footing, falling to the ground and hitting his head. The patient is at his mental status baseline per EMS after discussion with SNF staff.   Level 5 caveat: Dementia   Past Medical History:  Diagnosis Date  . CVA (cerebral vascular accident) ([place])   . Dementia ([place])   . Diabetic peripheral neuropathy (HCC)    a. both feet  . ETOH abuse    a. 04/2014 18-24 beers q weekend.  . H/O echocardiogram    a. 04/2014 Echo: EF 55-60%, no rwma.  . Hyperlipidemia   . Hypertension   . 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 Stroke [facility])    a. 04/2014 multiple bateral infarcts, presumed to be embolic;  b. 09/3714 Carotid U/S: 1-39% bilat ICA stenosis.  Marland Kitchen TIA (transient ischemic attack)    a. 08/2010.  . Type II diabetes mellitus [facility])     Patient Active Problem List   Diagnosis Date Noted  . Cerebral thrombosis with cerebral infarction ([place]) 07/07/2014  . Acute CVA (cerebrovascular accident) ([place]) 07/07/2014  . Altered mental status 07/06/2014  . Syncopal episodes 07/06/2014  . Left sided abdominal pain 07/06/2014  . Syncope and collapse 07/06/2014  . Cerebellar infarct ([place]) 04/21/2014  . Aspiration pneumonia ([place]) 04/20/2014  . Acute kidney injury ([place]) 04/20/2014  . Speech abnormality   . Stroke ([place]) 04/16/2014  . Embolic stroke ([place]) 96/78/9381  . Type II diabetes mellitus with neurological manifestations ([place]) 08/25/2010  . HTN (hypertension) 08/25/2010  . Hyperlipidemia 08/25/2010    Past Surgical History:  Procedure Laterality Date  . LOOP RECORDER IMPLANT N/A 04/20/2014   Procedure: LOOP RECORDER IMPLANT;   Surgeon: Thompson Grayer, MD;  Location: [facility] CATH LAB;  Service: Cardiovascular;  Laterality: N/A;  . repair of R hand after trauma    . sarcoma removal    . TEE WITHOUT CARDIOVERSION N/A 04/20/2014   Procedure: TRANSESOPHAGEAL ECHOCARDIOGRAM (TEE);  Surgeon: Larey Dresser, MD;  Location: [facility] ENDOSCOPY;  Service: Cardiovascular;  Laterality: N/A;    Allergies Patient has no known allergies.  Family History  Problem Relation Age of Onset  . Cancer Mother   . Cancer Father   . Diabetes Brother     Social History Social History   Tobacco Use  . Smoking status: Never Smoker  . Smokeless tobacco: Never Used  Substance Use Topics  . Alcohol use: Yes    Alcohol/week: 6.0 standard drinks    Types: 6 Standard drinks or equivalent per week    Comment: 18-24 beers every weekend.  . Drug use: No    Review of Systems  Level 5 caveat: Dementia.   ____________________________________________   PHYSICAL EXAM:  VITAL SIGNS: ED Triage Vitals  Enc Vitals Group     BP 06/03/20 0815 (!) 144/74     Pulse Rate 06/03/20 0815 70     Resp 06/03/20 0815 16     Temp 06/03/20 0815 98.1 F (36.7 C)     Temp Source 06/03/20 0815 Tympanic     SpO2 06/03/20 0815 97 %     Weight --  Height 06/03/20 0816 5\' 10"  (1.778 m)   Constitutional: Alert but confused. Well appearing and in no acute distress. Eyes: Conjunctivae are normal.  Head: Atraumatic. Small area of dried blood to the forehead.  Nose: No congestion/rhinnorhea. Mouth/Throat: Mucous membranes are moist. Neck: No stridor. No cervical spine tenderness to palpation. Cardiovascular: Normal rate, regular rhythm. Good peripheral circulation. Grossly normal heart sounds.   Respiratory: Normal respiratory effort.  No retractions. Lungs CTAB. Gastrointestinal: Soft and nontender. No distention.  Musculoskeletal: No lower extremity tenderness nor edema. No gross deformities of extremities. Normal passive ROM to the bilateral arms/legs.   Neurologic:  Baseline dysarthria. No gross focal neurologic deficits are appreciated.  Skin:  Skin is warm, dry and intact. No rash noted.  ____________________________________________  RADIOLOGY  CT Head Wo Contrast  Result Date: 06/03/2020 CLINICAL DATA:  Trauma, head and neck injury EXAM: CT HEAD WITHOUT CONTRAST CT CERVICAL SPINE WITHOUT CONTRAST TECHNIQUE: Multidetector CT imaging of the head and cervical spine was performed following the standard protocol without intravenous contrast. Multiplanar CT image reconstructions of the cervical spine were also generated. COMPARISON:  04/19/2014, 08/07/2010 FINDINGS: CT HEAD FINDINGS Brain: Brain atrophy and extensive chronic white matter microvascular ischemic changes throughout both cerebral hemispheres. These changes have progressed compared to 2012. Mild associated ventricular enlargement. Remote basal ganglia lacunar type infarcts. Remote right cerebellar infarct with encephalomalacia. No acute intracranial hemorrhage, definite new infarction, mass lesion midline shift herniation, or extra-axial fluid collection. No focal mass effect or edema. Vascular: No hyperdense vessel. Skull: Normal. Negative for fracture or focal lesion. Sinuses/Orbits: No acute finding. Other: None. CT CERVICAL SPINE FINDINGS Alignment: Normal. Skull base and vertebrae: No acute fracture. No primary bone lesion or focal pathologic process. Soft tissues and spinal canal: No prevertebral fluid or swelling. No visible canal hematoma. Disc levels: Advanced cervical degenerative disc disease with marked disc space narrowing, sclerosis and endplate osteophytes spanning C5-T1. Facets are aligned. No significant facet arthropathy. Degenerative changes of the C1-2 articulation. No acute osseous finding or fracture. Upper chest: Negative. Other: Carotid atherosclerosis noted. IMPRESSION: Atrophy and progressive white matter microvascular ischemic changes throughout both cerebral  hemispheres. Remote bilateral basal ganglia and right superior cerebellar infarcts. No acute intracranial abnormality by noncontrast CT. Lower cervical degenerative change as above. No acute osseous finding, fracture or malalignment by CT. Electronically Signed   By: Jerilynn Mages.  Shick M.D.   On: 06/03/2020 09:05   CT Cervical Spine Wo Contrast  Result Date: 06/03/2020 CLINICAL DATA:  Trauma, head and neck injury EXAM: CT HEAD WITHOUT CONTRAST CT CERVICAL SPINE WITHOUT CONTRAST TECHNIQUE: Multidetector CT imaging of the head and cervical spine was performed following the standard protocol without intravenous contrast. Multiplanar CT image reconstructions of the cervical spine were also generated. COMPARISON:  04/19/2014, 08/07/2010 FINDINGS: CT HEAD FINDINGS Brain: Brain atrophy and extensive chronic white matter microvascular ischemic changes throughout both cerebral hemispheres. These changes have progressed compared to 2012. Mild associated ventricular enlargement. Remote basal ganglia lacunar type infarcts. Remote right cerebellar infarct with encephalomalacia. No acute intracranial hemorrhage, definite new infarction, mass lesion midline shift herniation, or extra-axial fluid collection. No focal mass effect or edema. Vascular: No hyperdense vessel. Skull: Normal. Negative for fracture or focal lesion. Sinuses/Orbits: No acute finding. Other: None. CT CERVICAL SPINE FINDINGS Alignment: Normal. Skull base and vertebrae: No acute fracture. No primary bone lesion or focal pathologic process. Soft tissues and spinal canal: No prevertebral fluid or swelling. No visible canal hematoma. Disc levels: Advanced cervical degenerative disc disease with  marked disc space narrowing, sclerosis and endplate osteophytes spanning C5-T1. Facets are aligned. No significant facet arthropathy. Degenerative changes of the C1-2 articulation. No acute osseous finding or fracture. Upper chest: Negative. Other: Carotid atherosclerosis noted.  IMPRESSION: Atrophy and progressive white matter microvascular ischemic changes throughout both cerebral hemispheres. Remote bilateral basal ganglia and right superior cerebellar infarcts. No acute intracranial abnormality by noncontrast CT. Lower cervical degenerative change as above. No acute osseous finding, fracture or malalignment by CT. Electronically Signed   By: Jerilynn Mages.  Shick M.D.   On: 06/03/2020 09:05    ____________________________________________   PROCEDURES  Procedure(s) performed:   Procedures  None  ____________________________________________   INITIAL IMPRESSION / ASSESSMENT AND PLAN / ED COURSE  Pertinent labs & imaging results that were available during my care of the patient were reviewed by me and considered in my medical decision making (see chart for details).   Patient presents to the emergency department for evaluation of fall with head injury.  He is at his mental status baseline.  He has normal range of motion of the bilateral upper and lower extremities without pain.  No chest wall tenderness, abdominal tenderness, bruising.  He does have a small area of dried blood to the forehead which will likely not require suture.  I reviewed the chart and do not see a last tetanus shot.  Patient is unable to tell me.  Plan to update here and obtain CT imaging of the head and cervical spine.  Patient does have Plavix on his med list but is not anticoagulated.  09:10 AM  CT imaging of the head and cervical spine show chronic changes but nothing acute.  Went back to reassess patient and his mental status is at baseline.  I cleaned the area on his forehead.  There is a firmly adherent clot in place over a very small wound.  This will not require suture.  Plan for return to the nursing facility. ____________________________________________  FINAL CLINICAL IMPRESSION(S) / ED DIAGNOSES  Final diagnoses:  Fall, initial encounter  Injury of head, initial encounter      MEDICATIONS GIVEN DURING THIS VISIT:  Medications  Tdap (BOOSTRIX) injection 0.5 mL (has no administration in time range)     Note:  This document was prepared using Dragon voice recognition software and may include unintentional dictation errors.  Nanda Quinton, MD, [facility] Emergency Medicine    Charlett Merkle, [facility], MD 06/03/20 [phone]

## 2020-06-03 NOTE — ED Triage Notes (Signed)
Pt bib [facility] from [facility] for a witnessed fall. Pt was standing up from wheelchair to get a drink and lost his footing. Pt fell face first and has a lac to his forehead with bleeding controlled. Pt speech and mental status is baseline per facility. Pt denies pain and n/v.  EMS vitals: 98.3 74 HR 16 RR 97% RA 158/72 211 CBG

## 2020-06-03 NOTE — ED Notes (Signed)
Attempted to call report back to facility, will call back

## 2020-06-22 ENCOUNTER — Other Ambulatory Visit: Payer: Self-pay | Admitting: *Deleted

## 2020-06-22 DIAGNOSIS — M79606 Pain in leg, unspecified: Secondary | ICD-10-CM

## 2020-07-07 ENCOUNTER — Encounter (HOSPITAL_COMMUNITY): Payer: Medicare Other

## 2020-07-07 ENCOUNTER — Encounter: Payer: Medicare Other | Admitting: Vascular Surgery

## 2020-07-11 ENCOUNTER — Emergency Department (HOSPITAL_COMMUNITY): Payer: Medicare Other

## 2020-07-11 ENCOUNTER — Encounter (HOSPITAL_COMMUNITY): Payer: Self-pay | Admitting: Emergency Medicine

## 2020-07-11 ENCOUNTER — Other Ambulatory Visit: Payer: Self-pay

## 2020-07-11 ENCOUNTER — Emergency Department (HOSPITAL_COMMUNITY)
Admission: EM | Admit: 2020-07-11 | Discharge: 2020-07-11 | Disposition: A | Discharge status: Home / Self Care | Payer: Medicare Other | Attending: Emergency Medicine | Admitting: Emergency Medicine

## 2020-07-11 DIAGNOSIS — Z7982 Long term (current) use of aspirin: Secondary | ICD-10-CM | POA: Diagnosis not present

## 2020-07-11 DIAGNOSIS — F0390 Unspecified dementia without behavioral disturbance: Secondary | ICD-10-CM | POA: Insufficient documentation

## 2020-07-11 DIAGNOSIS — I10 Essential (primary) hypertension: Secondary | ICD-10-CM | POA: Insufficient documentation

## 2020-07-11 DIAGNOSIS — E1140 Type 2 diabetes mellitus with diabetic neuropathy, unspecified: Secondary | ICD-10-CM | POA: Diagnosis not present

## 2020-07-11 DIAGNOSIS — Z79899 Other long term (current) drug therapy: Secondary | ICD-10-CM | POA: Diagnosis not present

## 2020-07-11 DIAGNOSIS — Z7984 Long term (current) use of oral hypoglycemic drugs: Secondary | ICD-10-CM | POA: Diagnosis not present

## 2020-07-11 DIAGNOSIS — N179 Acute kidney failure, unspecified: Secondary | ICD-10-CM

## 2020-07-11 DIAGNOSIS — R4182 Altered mental status, unspecified: Secondary | ICD-10-CM | POA: Insufficient documentation

## 2020-07-11 DIAGNOSIS — R410 Disorientation, unspecified: Secondary | ICD-10-CM

## 2020-07-11 LAB — URINALYSIS, ROUTINE W REFLEX MICROSCOPIC
Bacteria, UA: NONE SEEN
Bilirubin Urine: NEGATIVE
Glucose, UA: 50 mg/dL — AB
Hgb urine dipstick: NEGATIVE
Ketones, ur: NEGATIVE mg/dL
Leukocytes,Ua: NEGATIVE
Nitrite: NEGATIVE
Protein, ur: >=300 mg/dL — AB
Specific Gravity, Urine: 1.014 (ref 1.005–1.030)
pH: 5 (ref 5.0–8.0)

## 2020-07-11 LAB — TROPONIN I (HIGH SENSITIVITY)
Troponin I (High Sensitivity): 5 ng/L (ref <18)
Troponin I (High Sensitivity): 5 ng/L (ref <18)

## 2020-07-11 LAB — CBC WITH DIFFERENTIAL/PLATELET
Abs Immature Granulocytes: 0.06 10*3/uL (ref 0.00–0.07)
Basophils Absolute: 0.1 10*3/uL (ref 0.0–0.1)
Basophils Relative: 1 %
Eosinophils Absolute: 0.1 10*3/uL (ref 0.0–0.5)
Eosinophils Relative: 1 %
HCT: 33.1 % — ABNORMAL LOW (ref 39.0–52.0)
Hemoglobin: 10.2 g/dL — ABNORMAL LOW (ref 13.0–17.0)
Immature Granulocytes: 1 %
Lymphocytes Relative: 16 %
Lymphs Abs: 1.6 10*3/uL (ref 0.7–4.0)
MCH: 27.9 pg (ref 26.0–34.0)
MCHC: 30.8 g/dL (ref 30.0–36.0)
MCV: 90.4 fL (ref 80.0–100.0)
Monocytes Absolute: 0.6 10*3/uL (ref 0.1–1.0)
Monocytes Relative: 6 %
Neutro Abs: 7.9 10*3/uL — ABNORMAL HIGH (ref 1.7–7.7)
Neutrophils Relative %: 75 %
Platelets: 220 10*3/uL (ref 150–400)
RBC: 3.66 MIL/uL — ABNORMAL LOW (ref 4.22–5.81)
RDW: 13.2 % (ref 11.5–15.5)
WBC: 10.3 10*3/uL (ref 4.0–10.5)
nRBC: 0 % (ref 0.0–0.2)

## 2020-07-11 LAB — COMPREHENSIVE METABOLIC PANEL
ALT: 9 U/L (ref 0–44)
AST: 12 U/L — ABNORMAL LOW (ref 15–41)
Albumin: 3 g/dL — ABNORMAL LOW (ref 3.5–5.0)
Alkaline Phosphatase: 57 U/L (ref 38–126)
Anion gap: 8 (ref 5–15)
BUN: 29 mg/dL — ABNORMAL HIGH (ref 8–23)
CO2: 26 mmol/L (ref 22–32)
Calcium: 8.9 mg/dL (ref 8.9–10.3)
Chloride: 107 mmol/L (ref 98–111)
Creatinine, Ser: 2.53 mg/dL — ABNORMAL HIGH (ref 0.61–1.24)
GFR, Estimated: 26 mL/min — ABNORMAL LOW (ref >60)
Glucose, Bld: 159 mg/dL — ABNORMAL HIGH (ref 70–99)
Potassium: 5 mmol/L (ref 3.5–5.1)
Sodium: 141 mmol/L (ref 135–145)
Total Bilirubin: 0.5 mg/dL (ref 0.3–1.2)
Total Protein: 5.9 g/dL — ABNORMAL LOW (ref 6.5–8.1)

## 2020-07-11 LAB — LIPASE, BLOOD: Lipase: 24 U/L (ref 11–51)

## 2020-07-11 MED ORDER — LACTATED RINGERS IV BOLUS
1000.0000 mL | Freq: Once | INTRAVENOUS | Status: AC
  Administered 2020-07-11: 1000 mL via INTRAVENOUS

## 2020-07-11 NOTE — ED Provider Notes (Signed)
[facility] DEPT Provider Note   CSN: [id number] Arrival date & time: 07/11/20  1346     History Chief Complaint  Patient presents with   Altered Mental Status    Hayden Mckinney is a 75 y.o. male.  Patient is a 75 year old male with history of CVA, dementia who lives in a skilled facility, hypertension, hyperlipidemia, prior alcohol abuse who is being sent in from his facility today because they feel like he was not acting himself.  They could not report what his baseline was but they did not feel like he was at it.  Based on prior medical records patient is awake but confused.  EMS did notice that he had 1 episode of emesis today.  Patient is unable to give any further history.  When asked if he is hurting anywhere he does report no.  He will follow commands.  No noted diarrhea but normal stool on exam.  The history is provided by the patient. The history is limited by the absence of a caregiver.  Altered Mental Status     Past Medical History:  Diagnosis Date   CVA (cerebral vascular accident) ([place])    Dementia ([place])    Diabetic peripheral neuropathy ([place])    a. both feet   ETOH abuse    a. 04/2014 18-24 beers q weekend.   H/O echocardiogram    a. 04/2014 Echo: EF 55-60%, no rwma.   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 04/2014 multiple bateral infarcts, presumed to be embolic;  b. 07/621 Carotid U/S: 1-39% bilat ICA stenosis.   TIA (transient ischemic attack)    a. 08/2010.   Type II diabetes mellitus [facility])     Patient Active Problem List   Diagnosis Date Noted   Cerebral thrombosis with cerebral infarction ([place]) 07/07/2014   Acute CVA (cerebrovascular accident) ([place]) 07/07/2014   Altered mental status 07/06/2014   Syncopal episodes 07/06/2014   Left sided abdominal pain 07/06/2014   Syncope and collapse 07/06/2014   Cerebellar infarct ([place]) 04/21/2014   Aspiration pneumonia ([place]) 04/20/2014    Acute kidney injury ([place]) 04/20/2014   Speech abnormality    Stroke ([place]) 76/28/3151   Embolic stroke ([place]) 76/16/0737   Type II diabetes mellitus with neurological manifestations ([place]) 08/25/2010   HTN (hypertension) 08/25/2010   Hyperlipidemia 08/25/2010    Past Surgical History:  Procedure Laterality Date   LOOP RECORDER IMPLANT N/A 04/20/2014   Procedure: LOOP RECORDER IMPLANT;  Surgeon: Thompson Grayer, MD;  Location: [facility] CATH LAB;  Service: Cardiovascular;  Laterality: N/A;   repair of R hand after trauma     sarcoma removal     TEE WITHOUT CARDIOVERSION N/A 04/20/2014   Procedure: TRANSESOPHAGEAL ECHOCARDIOGRAM (TEE);  Surgeon: Larey Dresser, MD;  Location: [facility] ENDOSCOPY;  Service: Cardiovascular;  Laterality: N/A;       Family History  Problem Relation Age of Onset   Cancer Mother    Cancer Father    Diabetes Brother     Social History   Tobacco Use   Smoking status: Never   Smokeless tobacco: Never  Substance Use Topics   Alcohol use: Yes    Alcohol/week: 6.0 standard drinks    Types: 6 Standard drinks or equivalent per week    Comment: 18-24 beers every weekend.   Drug use: No    Home Medications Prior to Admission medications  Medication Sig Start Date End Date Taking? Authorizing Provider  acetaminophen (TYLENOL) 325 MG tablet Take 2 tablets (650 mg total) by mouth every 6 (six) hours as needed for mild pain (or Fever >/= 101). 07/10/14   Bonnielee Haff, MD  aspirin 325 MG tablet Take 1 tablet (325 mg total) by mouth daily. 07/10/14   Bonnielee Haff, MD  atorvastatin (LIPITOR) 40 MG tablet Take 1 tablet (40 mg total) by mouth daily at 6 PM. 04/21/14   Thurnell Lose, MD  bisacodyl (DULCOLAX) 10 MG suppository Place 1 suppository (10 mg total) rectally daily as needed for moderate constipation. 07/10/14   Bonnielee Haff, MD  Blood Glucose Monitoring Suppl (RELION CONFIRM GLUCOSE MONITOR) W/DEVICE KIT Use as instructed. 12/14/11   Hoyt Koch, MD   carvedilol (COREG) 6.25 MG tablet Take 1 tablet (6.25 mg total) by mouth 2 (two) times daily with a meal. 04/21/14   Thurnell Lose, MD  clopidogrel (PLAVIX) 75 MG tablet Take 1 tablet (75 mg total) by mouth daily. 04/21/14   Thurnell Lose, MD  docusate sodium (COLACE) 100 MG capsule Take 1 capsule (100 mg total) by mouth 2 (two) times daily. 07/10/14   Bonnielee Haff, MD  famotidine (PEPCID) 20 MG tablet Take 1 tablet (20 mg total) by mouth daily. 07/10/14   Bonnielee Haff, MD  glimepiride (AMARYL) 1 MG tablet Take 1 mg by mouth daily with breakfast.    [provider]  isosorbide-hydrALAZINE (BIDIL) 20-37.5 MG per tablet Take 1 tablet by mouth 2 (two) times daily. 07/10/14   Bonnielee Haff, MD  loperamide (IMODIUM) 2 MG capsule Take 2 mg by mouth as needed for diarrhea or loose stools.    [provider]  pregabalin (LYRICA) 50 MG capsule Take 1 capsule (50 mg total) by mouth 2 (two) times daily. 11/21/11   Hoyt Koch, MD  sulfamethoxazole-trimethoprim (BACTRIM DS,SEPTRA DS) 800-160 MG per tablet Take 1 tablet by mouth 2 (two) times daily. For 5 more days. Stop after last dose on 07/14/14. Patient not taking: Reported on 06/03/2015 07/10/14   Bonnielee Haff, MD  tamsulosin (FLOMAX) 0.4 MG CAPS capsule Take 0.4 mg by mouth daily.    [provider]  tobramycin (TOBREX) 0.3 % ophthalmic solution Place 2 drops into the left eye 3 (three) times daily. For 5 days ending 06-07-15 06/02/15   [provider]  Vitamin D, Ergocalciferol, (DRISDOL) 50000 units CAPS capsule Take 50,000 Units by mouth every Tuesday.    [provider]    Allergies    Patient has no known allergies.  Review of Systems   Review of Systems  All other systems reviewed and are negative.  Physical Exam Updated Vital Signs BP 134/79   Pulse 70   Temp 98.6 F (37 C) (Oral)   Resp 19   SpO2 99%   Physical Exam Vitals and nursing note reviewed.  Constitutional:       General: He is not in acute distress.    Appearance: He is well-developed.  HENT:     Head: Normocephalic and atraumatic.  Eyes:     Conjunctiva/sclera: Conjunctivae normal.     Pupils: Pupils are equal, round, and reactive to light.  Cardiovascular:     Rate and Rhythm: Normal rate and regular rhythm.     Heart sounds: No murmur heard. Pulmonary:     Effort: Pulmonary effort is normal. No respiratory distress.     Breath sounds: Normal breath sounds. No wheezing or rales.  Abdominal:     General: There is no distension.     Palpations: Abdomen is soft.     Tenderness: There is no abdominal tenderness. There is no guarding or rebound.  Musculoskeletal:        General: No tenderness. Normal range of motion.     Cervical back: Normal range of motion and neck supple.     Right lower leg: No edema.     Left lower leg: No edema.     Comments: Healing wound on the left tib/fib  Skin:    General: Skin is warm and dry.     Findings: No erythema or rash.  Neurological:     Mental Status: He is alert.     Motor: No weakness.     Comments: Awake and able to follow commands.  Moving all ext.  Psychiatric:        Behavior: Behavior normal.    ED Results / Procedures / Treatments   Labs (all labs ordered are listed, but only abnormal results are displayed) Labs Reviewed  CBC WITH DIFFERENTIAL/PLATELET - Abnormal; Notable for the following components:      Result Value   RBC 3.66 (*)    Hemoglobin 10.2 (*)    HCT 33.1 (*)    Neutro Abs 7.9 (*)    All other components within normal limits  COMPREHENSIVE METABOLIC PANEL - Abnormal; Notable for the following components:   Glucose, Bld 159 (*)    BUN 29 (*)    Creatinine, Ser 2.53 (*)    Total Protein 5.9 (*)    Albumin 3.0 (*)    AST 12 (*)    GFR, Estimated 26 (*)    All other components within normal limits  LIPASE, BLOOD  URINALYSIS, ROUTINE W REFLEX MICROSCOPIC  TROPONIN I (HIGH SENSITIVITY)  TROPONIN I (HIGH SENSITIVITY)     EKG EKG Interpretation  Date/Time:  Monday July 11 2020 13:59:58 EDT Ventricular Rate:  74 PR Interval:  223 QRS Duration: 127 QT Interval:  431 QTC Calculation: 479 R Axis:   -79 Text Interpretation: Sinus rhythm Prolonged PR interval RBBB and LAFB No significant change since last tracing Confirmed by Blanchie Dessert ([id number]) on 07/11/2020 3:43:17 PM  Radiology CT Head Wo Contrast  Result Date: 07/11/2020 CLINICAL DATA:  76 year old with mental status change. EXAM: CT HEAD WITHOUT CONTRAST TECHNIQUE: Contiguous axial images were obtained from the base of the skull through the vertex without intravenous contrast. COMPARISON:  06/03/2020 FINDINGS: Brain: Stable encephalomalacia in the right cerebellum compatible with previous insult. Chronic low-density areas in the bilateral basal ganglia and left external capsule. These findings are compatible with old infarcts. Stable cerebral atrophy. Mild enlargement of the ventricles is unchanged. Stable low-density in the periventricular and subcortical white matter. No evidence for acute hemorrhage, mass lesion, midline shift, hydrocephalus or large infarct. Vascular: No hyperdense vessel or unexpected calcification. Skull: Normal. Negative for fracture or focal lesion. Sinuses/Orbits: No acute finding. Other: None IMPRESSION: 1. No acute intracranial abnormality. 2. Stable atrophy and evidence for chronic small vessel ischemic changes. 3. Remote infarcts involving the bilateral basal ganglia and right cerebellum. Electronically Signed   By: Markus Daft M.D.   On: 07/11/2020 15:32   DG Chest Port 1 View  Result Date: 07/11/2020 CLINICAL DATA:  Altered mental status EXAM: PORTABLE CHEST 1 VIEW COMPARISON:  06/03/2015 FINDINGS: Stable cardiomediastinal contours. Loop recorder projects over the left heart. Left pleural effusion with increased opacification of the left lung base noted. Right  lung appears clear. Osseous structures appear intact IMPRESSION:  Left pleural effusion with increased opacification of the left lung base. Electronically Signed   By: Kerby Moors M.D.   On: 07/11/2020 15:14    Procedures Procedures   Medications Ordered in ED Medications  lactated ringers bolus 1,000 mL (1,000 mLs Intravenous New Bag/Given 07/11/20 1454)    ED Course  I have reviewed the triage vital signs and the nursing notes.  Pertinent labs & imaging results that were available during my care of the patient were reviewed by me and considered in my medical decision making (see chart for details).    MDM Rules/Calculators/A&P                          Elderly male presenting from skilled facility due to change in his normal behavior.  However nursing facility was not able to convey what his baseline was.  Based on prior medical records patient is awake and alert but baseline confusion.  He has had multiple strokes in the past.  EMS did report it appeared that he had had 1 episode of emesis.  He is well-appearing here and does not appear to be in any acute distress.  Vital signs are normal.  He has no evidence of diarrhea.  No signs of pain with palpation of the abdomen.  Breath sounds are clear.  Labs today show a CBC with a normal white count of 10, hemoglobin of 10.2 which is baseline, CMP today with a creatinine of 2.53 but otherwise normal.  Last creatinine was from 2017 without new results to compare.  Could be his baseline.  Lipase, troponin within normal limits, head CT today no no acute abnormalities and chronic infarcts.  Chest x-ray shows a left pleural effusion with increased opacification of the left lung base but patient is satting 99% on room air with no tachypnea or evidence of shortness of breath.  Low suspicion for pneumonia at this time.  EKG without acute findings.  Will get UA to ensure no evidence of UTI.  Patient will most likely be able to be transferred back to his facility.  He was given IV fluids.  He will need recheck of his  creatinine next week.  Patient does not take NSAIDs or ACE inhibitors or ARB's.  Final Clinical Impression(s) / ED Diagnoses Final diagnoses:  None    Rx / DC Orders ED Discharge Orders     None        Blanchie Dessert, MD 07/13/20 2214

## 2020-07-11 NOTE — ED Provider Notes (Signed)
75 year old male brought in by EMS from [facility] for possible change in mental status.  Work-up has been fairly unremarkable so far.  Creatinine elevated will need outpatient follow-up.  Patient is pending urinalysis.  Plan is to return to facility plus minus antibiotics. Physical Exam  BP 134/79   Pulse 70   Temp 98.6 F (37 C) (Oral)   Resp 19   SpO2 99%   Physical Exam  ED Course/Procedures     Procedures  MDM  Urinalysis does not show any obvious signs of infection.  Patient will be returned to facility with recommendations to follow-up on repeat labs including creatinine.  Encourage fluids.  Return instructions       Hayden Rasmussen, MD 07/12/20 1102

## 2020-07-11 NOTE — ED Notes (Signed)
Mariann Laster at [employer] given report.

## 2020-07-11 NOTE — ED Notes (Signed)
Pt continuing to take off pulse ox and leads. Pt places in mittens for safety.

## 2020-07-11 NOTE — ED Notes (Signed)
[facility] called; no answer. This RN left voicemail message for a call back.

## 2020-07-11 NOTE — ED Triage Notes (Signed)
BIBA Per EMS: Pt coming from [facility] with complaints of AMS. EMS states they are unaware of normal baseline for pt and that nursing facility was unable to tell them normal status other than "alert but confused." Pt had one episode of vomiting with EMS. All vitals WDL.

## 2020-07-11 NOTE — ED Notes (Signed)
[facility] called for transport.  

## 2020-07-11 NOTE — Discharge Instructions (Addendum)
You were seen in the emergency department for an episode of possible confusion.  You had lab work chest x-ray EKG and head CT.  The only significant finding was that your kidney function had worsened.  You received some IV fluids.  Your kidney function will need to be rechecked in the next week or so.  Return to the emergency department for any worsening or concerning symptoms

## 2020-07-26 ENCOUNTER — Ambulatory Visit (HOSPITAL_COMMUNITY): Payer: Medicare Other

## 2020-07-26 ENCOUNTER — Encounter: Payer: Medicare Other | Admitting: Vascular Surgery

## 2020-07-28 ENCOUNTER — Ambulatory Visit (HOSPITAL_COMMUNITY)
Admission: RE | Admit: 2020-07-28 | Discharge: 2020-07-28 | Disposition: A | Discharge status: Home / Self Care | Payer: Medicare Other | Source: Ambulatory Visit | Attending: Vascular Surgery | Admitting: Vascular Surgery

## 2020-07-28 ENCOUNTER — Other Ambulatory Visit: Payer: Self-pay

## 2020-07-28 ENCOUNTER — Encounter: Payer: Self-pay | Admitting: Vascular Surgery

## 2020-07-28 ENCOUNTER — Ambulatory Visit (INDEPENDENT_AMBULATORY_CARE_PROVIDER_SITE_OTHER): Payer: Medicare Other | Admitting: Vascular Surgery

## 2020-07-28 VITALS — BP 180/88 | HR 72 | Temp 97.7°F | Resp 20 | Ht 70.0 in | Wt 153.0 lb

## 2020-07-28 DIAGNOSIS — I739 Peripheral vascular disease, unspecified: Secondary | ICD-10-CM

## 2020-07-28 DIAGNOSIS — M79606 Pain in leg, unspecified: Secondary | ICD-10-CM

## 2020-07-28 NOTE — Progress Notes (Signed)
Referring Physician: Dr. Benita Stabile  Patient name: Hayden Mckinney MRN: [id number] DOB: [birth year] Sex: male  REASON FOR CONSULT: Nonhealing wounds  HPI: Hayden Mckinney is a 75 y.o. male, with a 1 year history of nonhealing wounds of both lower extremities.  Patient is nonambulatory.  He does use his legs to transfer.  He resides in a skilled nursing facility.  He does have some mild dementia.  He has had a previous stroke.  He has a wound over the lateral malleolus in both legs and the right pretibial area.  He is currently receiving local wound care for this.  He believes that the wounds are healing.  Other medical problems include peripheral neuropathy, hypertension, hyperlipidemia, diabetes.  He states he only has pain in the wounds when they are debriding undergoing local wound care.  Patient did have a duplex ultrasound the skilled nursing facility results listed below  Past Medical History:  Diagnosis Date   CVA (cerebral vascular accident) ([place])    Dementia ([place])    Diabetic peripheral neuropathy ([place])    a. both feet   ETOH abuse    a. 04/2014 18-24 beers q weekend.   H/O echocardiogram    a. 04/2014 Echo: EF 55-60%, no rwma.   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 04/2014 multiple bateral infarcts, presumed to be embolic;  b. 02/7060 Carotid U/S: 1-39% bilat ICA stenosis.   TIA (transient ischemic attack)    a. 08/2010.   Type II diabetes mellitus ([place])    Past Surgical History:  Procedure Laterality Date   LOOP RECORDER IMPLANT N/A 04/20/2014   Procedure: LOOP RECORDER IMPLANT;  Surgeon: Thompson Grayer, MD;  Location: [facility] CATH LAB;  Service: Cardiovascular;  Laterality: N/A;   repair of R hand after trauma     sarcoma removal     TEE WITHOUT CARDIOVERSION N/A 04/20/2014   Procedure: TRANSESOPHAGEAL ECHOCARDIOGRAM (TEE);  Surgeon: Larey Dresser, MD;  Location: [facility] ENDOSCOPY;  Service: Cardiovascular;  Laterality: N/A;     Family History  Problem Relation Age of Onset   Cancer Mother    Cancer Father    Diabetes Brother     SOCIAL HISTORY: Social History   Socioeconomic History   Marital status: Widowed    Spouse name: Not on file   Number of children: Not on file   Years of education: Not on file   Highest education level: Not on file  Occupational History   Occupation: Surveyor, quantity    Comment: retired  Tobacco Use   Smoking status: Never   Smokeless tobacco: Never  Substance and Sexual Activity   Alcohol use: Yes    Alcohol/week: 6.0 standard drinks    Types: 6 Standard drinks or equivalent per week    Comment: 18-24 beers every weekend.   Drug use: No   Sexual activity: Not on file  Other Topics Concern   Not on file  Social History Narrative   Lives with a room mate in [place].  Sedentary.  Drinks between 18 and 24 beers every weekend.   Social Determinants of Health   Financial Resource Strain: Not on file  Food Insecurity: Not on file  Transportation Needs: Not on file  Physical Activity: Not on file  Stress: Not on file  Social Connections: Not on file  Intimate Partner Violence: Not on file    No Known  Allergies  Current Outpatient Medications  Medication Sig Dispense Refill   acetaminophen (TYLENOL) 325 MG tablet Take 2 tablets (650 mg total) by mouth every 6 (six) hours as needed for mild pain (or Fever >/= 101).     aspirin 325 MG tablet Take 1 tablet (325 mg total) by mouth daily.     atorvastatin (LIPITOR) 40 MG tablet Take 1 tablet (40 mg total) by mouth daily at 6 PM.     bisacodyl (DULCOLAX) 10 MG suppository Place 1 suppository (10 mg total) rectally daily as needed for moderate constipation. 12 suppository 0   Blood Glucose Monitoring Suppl (RELION CONFIRM GLUCOSE MONITOR) W/DEVICE KIT Use as instructed. 1 kit 0   carvedilol (COREG) 6.25 MG tablet Take 1 tablet (6.25 mg total) by mouth 2 (two) times daily with a meal.     clopidogrel (PLAVIX) 75 MG  tablet Take 1 tablet (75 mg total) by mouth daily.     docusate sodium (COLACE) 100 MG capsule Take 1 capsule (100 mg total) by mouth 2 (two) times daily. 10 capsule 0   famotidine (PEPCID) 20 MG tablet Take 1 tablet (20 mg total) by mouth daily.     glimepiride (AMARYL) 1 MG tablet Take 1 mg by mouth daily with breakfast.     isosorbide-hydrALAZINE (BIDIL) 20-37.5 MG per tablet Take 1 tablet by mouth 2 (two) times daily.     loperamide (IMODIUM) 2 MG capsule Take 2 mg by mouth as needed for diarrhea or loose stools.     pregabalin (LYRICA) 50 MG capsule Take 1 capsule (50 mg total) by mouth 2 (two) times daily. 60 capsule 6   sulfamethoxazole-trimethoprim (BACTRIM DS,SEPTRA DS) 800-160 MG per tablet Take 1 tablet by mouth 2 (two) times daily. For 5 more days. Stop after last dose on 07/14/14. (Patient not taking: Reported on 06/03/2015)     tamsulosin (FLOMAX) 0.4 MG CAPS capsule Take 0.4 mg by mouth daily.     tobramycin (TOBREX) 0.3 % ophthalmic solution Place 2 drops into the left eye 3 (three) times daily. For 5 days ending 06-07-15     Vitamin D, Ergocalciferol, (DRISDOL) 50000 units CAPS capsule Take 50,000 Units by mouth every Tuesday.     No current facility-administered medications for this visit.    ROS:   General:  No weight loss, Fever, chills  HEENT: No recent headaches, no nasal bleeding, no visual changes, no sore throat  Neurologic: No dizziness, blackouts, seizures. No recent symptoms of stroke or mini- stroke. No recent episodes of slurred speech, or temporary blindness.  Cardiac: No recent episodes of chest pain/pressure, no shortness of breath at rest.  No shortness of breath with exertion.  Denies history of atrial fibrillation or irregular heartbeat  Vascular: No history of rest pain in feet.  No history of claudication.  No history of non-healing ulcer, No history of DVT   Pulmonary: No home oxygen, no productive cough, no hemoptysis,  No asthma or  wheezing  Musculoskeletal:  '[ ]'  Arthritis, '[ ]'  Low back pain,  '[ ]'  Joint pain  Hematologic:No history of hypercoagulable state.  No history of easy bleeding.  No history of anemia  Gastrointestinal: No hematochezia or melena,  No gastroesophageal reflux, no trouble swallowing  Urinary: '[ ]'  chronic Kidney disease, '[ ]'  on HD - '[ ]'  MWF or '[ ]'  TTHS, '[ ]'  Burning with urination, '[ ]'  Frequent urination, '[ ]'  Difficulty urinating;   Skin: No rashes  Psychological: No history of anxiety,  No history of depression   Physical Examination   Vitals:   07/28/20 1513  BP: (!) 180/88  Pulse: 72  Resp: 20  Temp: 97.7 F (36.5 C)  SpO2: 97%  Weight: 153 lb (69.4 kg)  Height: '5\' 10"'  (1.778 m)   General:  Alert and oriented, no acute distress HEENT: Normal Neck: No bruit or JVD Pulmonary: Clear to auscultation bilaterally Cardiac: Regular Rate and Rhythm without murmur Abdomen: Soft, non-tender, non-distended Skin: No rash, 3 cm ulceration lateral malleolus both legs ointment on this precludes determining if there is any granulation tissue.  Each wound is less than a millimeter depth.  3 cm wound on the right pretibial region similar in appearance. Extremity Pulses:  2+ radial, brachial, femoral, 2+ right popliteal 1+ left popliteal absent dorsalis pedis, posterior tibial pulses bilaterally Musculoskeletal: No deformity or edema  Neurologic: Upper and lower extremity motor 5/5 and symmetric  DATA:  Patient had bilateral ABIs performed today.  Right side was 0.9 for left side was noncompressible.  Toe pressure on the right side was 76 toe pressure on the left side was 100  Patient had duplex ultrasound done by [facility] dated Jun 07, 2020 which showed some right distal SFA stenosis and tibial disease.  There was some tibial disease on the left side as well.    ASSESSMENT: Patient with nonhealing wounds both legs.  Probably has mostly tibial disease but may have an element  of SFA disease on the right side.  This is by duplex.  On clinical exam he has a much fuller popliteal pulse on the right suggestive that his SFA stenosis is probably fairly minimal.   PLAN: Discussed with the patient arteriogram possible lower extremity runoff possible intervention to improve arterial flow to his right and left legs.  However, the patient is not currently interested in procedure.  He does have reasonable flow to both legs.  He has a toe pressure on the left side of 100 which should be adequate for wound healing.  Toe pressure on the right side of 76 also should be adequate for wound healing.  The patient will follow up with [place] on an as-needed basis if he decides to proceed with the arteriogram.   Ruta Hinds, MD [facility]: [phone]

## 2020-10-23 ENCOUNTER — Other Ambulatory Visit: Payer: Self-pay

## 2020-10-23 ENCOUNTER — Inpatient Hospital Stay (HOSPITAL_COMMUNITY)
Admission: EM | Admit: 2020-10-23 | Discharge: 2020-10-25 | DRG: 071 | Disposition: A | Discharge status: Skilled Nursing Facility | Payer: Medicare Other | Source: Skilled Nursing Facility | Attending: Neurology | Admitting: Neurology

## 2020-10-23 ENCOUNTER — Emergency Department (HOSPITAL_COMMUNITY): Payer: Medicare Other

## 2020-10-23 ENCOUNTER — Other Ambulatory Visit (HOSPITAL_COMMUNITY): Payer: Medicare Other

## 2020-10-23 ENCOUNTER — Inpatient Hospital Stay (HOSPITAL_COMMUNITY): Payer: Medicare Other

## 2020-10-23 DIAGNOSIS — F0390 Unspecified dementia without behavioral disturbance: Secondary | ICD-10-CM | POA: Diagnosis present

## 2020-10-23 DIAGNOSIS — E440 Moderate protein-calorie malnutrition: Secondary | ICD-10-CM | POA: Diagnosis not present

## 2020-10-23 DIAGNOSIS — E11649 Type 2 diabetes mellitus with hypoglycemia without coma: Secondary | ICD-10-CM | POA: Diagnosis not present

## 2020-10-23 DIAGNOSIS — R479 Unspecified speech disturbances: Secondary | ICD-10-CM | POA: Diagnosis not present

## 2020-10-23 DIAGNOSIS — E785 Hyperlipidemia, unspecified: Secondary | ICD-10-CM | POA: Diagnosis present

## 2020-10-23 DIAGNOSIS — Z8673 Personal history of transient ischemic attack (TIA), and cerebral infarction without residual deficits: Secondary | ICD-10-CM | POA: Diagnosis not present

## 2020-10-23 DIAGNOSIS — I10 Essential (primary) hypertension: Secondary | ICD-10-CM | POA: Diagnosis not present

## 2020-10-23 DIAGNOSIS — R471 Dysarthria and anarthria: Secondary | ICD-10-CM | POA: Diagnosis present

## 2020-10-23 DIAGNOSIS — I639 Cerebral infarction, unspecified: Secondary | ICD-10-CM

## 2020-10-23 DIAGNOSIS — Z20822 Contact with and (suspected) exposure to covid-19: Secondary | ICD-10-CM | POA: Diagnosis present

## 2020-10-23 DIAGNOSIS — G9340 Encephalopathy, unspecified: Principal | ICD-10-CM | POA: Diagnosis present

## 2020-10-23 DIAGNOSIS — R531 Weakness: Secondary | ICD-10-CM | POA: Diagnosis present

## 2020-10-23 DIAGNOSIS — E7800 Pure hypercholesterolemia, unspecified: Secondary | ICD-10-CM | POA: Diagnosis not present

## 2020-10-23 DIAGNOSIS — E1142 Type 2 diabetes mellitus with diabetic polyneuropathy: Secondary | ICD-10-CM | POA: Diagnosis present

## 2020-10-23 DIAGNOSIS — R4701 Aphasia: Secondary | ICD-10-CM | POA: Diagnosis present

## 2020-10-23 DIAGNOSIS — E1149 Type 2 diabetes mellitus with other diabetic neurological complication: Secondary | ICD-10-CM | POA: Diagnosis not present

## 2020-10-23 DIAGNOSIS — I672 Cerebral atherosclerosis: Secondary | ICD-10-CM | POA: Diagnosis present

## 2020-10-23 DIAGNOSIS — Z7982 Long term (current) use of aspirin: Secondary | ICD-10-CM

## 2020-10-23 DIAGNOSIS — Z79899 Other long term (current) drug therapy: Secondary | ICD-10-CM

## 2020-10-23 DIAGNOSIS — E1151 Type 2 diabetes mellitus with diabetic peripheral angiopathy without gangrene: Secondary | ICD-10-CM | POA: Diagnosis present

## 2020-10-23 DIAGNOSIS — R4182 Altered mental status, unspecified: Secondary | ICD-10-CM | POA: Diagnosis present

## 2020-10-23 DIAGNOSIS — Z7902 Long term (current) use of antithrombotics/antiplatelets: Secondary | ICD-10-CM | POA: Diagnosis not present

## 2020-10-23 DIAGNOSIS — Z833 Family history of diabetes mellitus: Secondary | ICD-10-CM

## 2020-10-23 DIAGNOSIS — G9341 Metabolic encephalopathy: Secondary | ICD-10-CM | POA: Diagnosis not present

## 2020-10-23 DIAGNOSIS — E43 Unspecified severe protein-calorie malnutrition: Secondary | ICD-10-CM | POA: Insufficient documentation

## 2020-10-23 LAB — CBC
HCT: 28.5 % — ABNORMAL LOW (ref 39.0–52.0)
Hemoglobin: 8.9 g/dL — ABNORMAL LOW (ref 13.0–17.0)
MCH: 28.3 pg (ref 26.0–34.0)
MCHC: 31.2 g/dL (ref 30.0–36.0)
MCV: 90.5 fL (ref 80.0–100.0)
Platelets: 249 10*3/uL (ref 150–400)
RBC: 3.15 MIL/uL — ABNORMAL LOW (ref 4.22–5.81)
RDW: 12.5 % (ref 11.5–15.5)
WBC: 8.4 10*3/uL (ref 4.0–10.5)
nRBC: 0 % (ref 0.0–0.2)

## 2020-10-23 LAB — I-STAT CHEM 8, ED
BUN: 26 mg/dL — ABNORMAL HIGH (ref 8–23)
Calcium, Ion: 1.08 mmol/L — ABNORMAL LOW (ref 1.15–1.40)
Chloride: 107 mmol/L (ref 98–111)
Creatinine, Ser: 3 mg/dL — ABNORMAL HIGH (ref 0.61–1.24)
Glucose, Bld: 185 mg/dL — ABNORMAL HIGH (ref 70–99)
HCT: 25 % — ABNORMAL LOW (ref 39.0–52.0)
Hemoglobin: 8.5 g/dL — ABNORMAL LOW (ref 13.0–17.0)
Potassium: 4.5 mmol/L (ref 3.5–5.1)
Sodium: 140 mmol/L (ref 135–145)
TCO2: 23 mmol/L (ref 22–32)

## 2020-10-23 LAB — DIFFERENTIAL
Abs Immature Granulocytes: 0.05 10*3/uL (ref 0.00–0.07)
Basophils Absolute: 0.1 10*3/uL (ref 0.0–0.1)
Basophils Relative: 1 %
Eosinophils Absolute: 0.1 10*3/uL (ref 0.0–0.5)
Eosinophils Relative: 1 %
Immature Granulocytes: 1 %
Lymphocytes Relative: 34 %
Lymphs Abs: 2.9 10*3/uL (ref 0.7–4.0)
Monocytes Absolute: 0.7 10*3/uL (ref 0.1–1.0)
Monocytes Relative: 8 %
Neutro Abs: 4.7 10*3/uL (ref 1.7–7.7)
Neutrophils Relative %: 55 %

## 2020-10-23 LAB — COMPREHENSIVE METABOLIC PANEL
ALT: 10 U/L (ref 0–44)
AST: 11 U/L — ABNORMAL LOW (ref 15–41)
Albumin: 1.9 g/dL — ABNORMAL LOW (ref 3.5–5.0)
Alkaline Phosphatase: 64 U/L (ref 38–126)
Anion gap: 5 (ref 5–15)
BUN: 27 mg/dL — ABNORMAL HIGH (ref 8–23)
CO2: 24 mmol/L (ref 22–32)
Calcium: 7.7 mg/dL — ABNORMAL LOW (ref 8.9–10.3)
Chloride: 110 mmol/L (ref 98–111)
Creatinine, Ser: 3.1 mg/dL — ABNORMAL HIGH (ref 0.61–1.24)
GFR, Estimated: 20 mL/min — ABNORMAL LOW (ref >60)
Glucose, Bld: 191 mg/dL — ABNORMAL HIGH (ref 70–99)
Potassium: 4.5 mmol/L (ref 3.5–5.1)
Sodium: 139 mmol/L (ref 135–145)
Total Bilirubin: 0.7 mg/dL (ref 0.3–1.2)
Total Protein: 4.9 g/dL — ABNORMAL LOW (ref 6.5–8.1)

## 2020-10-23 LAB — MRSA NEXT GEN BY PCR, NASAL: MRSA by PCR Next Gen: NOT DETECTED

## 2020-10-23 LAB — CBG MONITORING, ED
Glucose-Capillary: 153 mg/dL — ABNORMAL HIGH (ref 70–99)
Glucose-Capillary: 150 mg/dL — ABNORMAL HIGH (ref 70–99)
Glucose-Capillary: 119 mg/dL — ABNORMAL HIGH (ref 70–99)

## 2020-10-23 LAB — APTT: aPTT: 28 seconds (ref 24–36)

## 2020-10-23 LAB — HEMOGLOBIN A1C
Hgb A1c MFr Bld: 8.6 % — ABNORMAL HIGH (ref 4.8–5.6)
Mean Plasma Glucose: 200.12 mg/dL

## 2020-10-23 LAB — RESP PANEL BY RT-PCR (FLU A&B, COVID) ARPGX2
Influenza A by PCR: NEGATIVE
Influenza B by PCR: NEGATIVE
SARS Coronavirus 2 by RT PCR: NEGATIVE

## 2020-10-23 LAB — GLUCOSE, CAPILLARY
Glucose-Capillary: 109 mg/dL — ABNORMAL HIGH (ref 70–99)
Glucose-Capillary: 104 mg/dL — ABNORMAL HIGH (ref 70–99)
Glucose-Capillary: 107 mg/dL — ABNORMAL HIGH (ref 70–99)

## 2020-10-23 LAB — PROTIME-INR
INR: 1 (ref 0.8–1.2)
Prothrombin Time: 13.2 seconds (ref 11.4–15.2)

## 2020-10-23 MED ORDER — ORAL CARE MOUTH RINSE
15.0000 mL | Freq: Two times a day (BID) | OROMUCOSAL | Status: DC
  Administered 2020-10-24 – 2020-10-25 (×3): 15 mL via OROMUCOSAL

## 2020-10-23 MED ORDER — IOHEXOL 350 MG/ML SOLN
100.0000 mL | Freq: Once | INTRAVENOUS | Status: AC | PRN
  Administered 2020-10-23: 100 mL via INTRAVENOUS

## 2020-10-23 MED ORDER — SODIUM CHLORIDE 0.9 % IV SOLN: INTRAVENOUS | Status: DC

## 2020-10-23 MED ORDER — PANTOPRAZOLE SODIUM 40 MG IV SOLR
40.0000 mg | Freq: Every day | INTRAVENOUS | Status: DC
  Administered 2020-10-23: 40 mg via INTRAVENOUS
  Filled 2020-10-23: qty 40

## 2020-10-23 MED ORDER — CHLORHEXIDINE GLUCONATE 0.12 % MT SOLN
15.0000 mL | Freq: Two times a day (BID) | OROMUCOSAL | Status: DC
  Administered 2020-10-23 – 2020-10-25 (×4): 15 mL via OROMUCOSAL
  Filled 2020-10-23 (×4): qty 15

## 2020-10-23 MED ORDER — CHLORHEXIDINE GLUCONATE CLOTH 2 % EX PADS
6.0000 | MEDICATED_PAD | Freq: Every day | CUTANEOUS | Status: DC
  Administered 2020-10-25: 6 via TOPICAL

## 2020-10-23 MED ORDER — TENECTEPLASE FOR STROKE
0.2500 mg/kg | PACK | Freq: Once | INTRAVENOUS | Status: AC
  Administered 2020-10-23: 17 mg via INTRAVENOUS
  Filled 2020-10-23: qty 3.4

## 2020-10-23 MED ORDER — ACETAMINOPHEN 325 MG PO TABS
650.0000 mg | ORAL_TABLET | ORAL | Status: DC | PRN
Start: 2020-10-23 — End: 2020-10-25

## 2020-10-23 MED ORDER — INSULIN ASPART 100 UNIT/ML IJ SOLN
0.0000 [IU] | INTRAMUSCULAR | Status: DC
  Administered 2020-10-24: 2 [IU] via SUBCUTANEOUS
  Administered 2020-10-24: 1 [IU] via SUBCUTANEOUS
  Administered 2020-10-25: 3 [IU] via SUBCUTANEOUS

## 2020-10-23 MED ORDER — CLEVIDIPINE BUTYRATE 0.5 MG/ML IV EMUL: 0.0000 mg/h | INTRAVENOUS | Status: DC

## 2020-10-23 MED ORDER — HYDRALAZINE HCL 20 MG/ML IJ SOLN
10.0000 mg | Freq: Four times a day (QID) | INTRAMUSCULAR | Status: DC | PRN
  Administered 2020-10-23 – 2020-10-24 (×2): 10 mg via INTRAVENOUS
  Filled 2020-10-23 (×2): qty 1

## 2020-10-23 MED ORDER — SENNOSIDES-DOCUSATE SODIUM 8.6-50 MG PO TABS
1.0000 | ORAL_TABLET | Freq: Two times a day (BID) | ORAL | Status: DC
Start: 2020-10-23 — End: 2020-10-25
  Administered 2020-10-24 (×2): 1 via ORAL
  Filled 2020-10-23 (×3): qty 1

## 2020-10-23 MED ORDER — STROKE: EARLY STAGES OF RECOVERY BOOK
Freq: Once | Status: DC
Start: 2020-10-23 — End: 2020-10-25
  Filled 2020-10-23: qty 1

## 2020-10-23 MED ORDER — ACETAMINOPHEN 650 MG RE SUPP: 650.0000 mg | RECTAL | Status: DC | PRN

## 2020-10-23 MED ORDER — SODIUM CHLORIDE 0.9% FLUSH
3.0000 mL | Freq: Once | INTRAVENOUS | Status: AC
Start: 2020-10-23 — End: 2020-10-23
  Administered 2020-10-23: 3 mL via INTRAVENOUS

## 2020-10-23 MED ORDER — INSULIN ASPART 100 UNIT/ML IJ SOLN
0.0000 [IU] | INTRAMUSCULAR | Status: DC
Start: 2020-10-23 — End: 2020-10-23
  Administered 2020-10-23: 1 [IU] via SUBCUTANEOUS

## 2020-10-23 MED ORDER — ACETAMINOPHEN 160 MG/5ML PO SOLN: 650.0000 mg | ORAL | Status: DC | PRN

## 2020-10-23 MED ORDER — LABETALOL HCL 5 MG/ML IV SOLN: 20.0000 mg | Freq: Once | INTRAVENOUS | Status: DC

## 2020-10-23 NOTE — ED Triage Notes (Signed)
Pt arrived by EMS  as a code stroke. Pt last known normal was 0630 when staff assisted him to get dressed. Pt then had an episode of being unresponsive. Hx of dementia and stroke

## 2020-10-23 NOTE — ED Notes (Signed)
Pt transported to CT for repeat head CT

## 2020-10-23 NOTE — H&P (Addendum)
Neurology H&P  CC: Code stroke  History is obtained from: chart.   HPI: Hayden Mckinney is a 75 y.o. male who resides in a SNF and presents as a code stroke via EMS. PMHx includes remote CVA 2016 with sequelae of dysarthria, non healing wounds on LEs, peripheral neuropathy, HTN, HLD, DM II, TIA 2012 and dementia.   LKW is 0630 am when staff came in to help him get dressed and found him slumped over in wheelchair. 911 was called.   After airway clearance at ED bridge, patient was taken to CT suite. No definite stroke on CT and no LVO on CTA head an neck.   GIven his symptoms of dysarthria and expressive aphasia coupled with weakness, decision was made to give TNK. His BP was within range.   The patient was already on Plavix/ASA for years for hx of old stroke. In 2016 when he had an acute stroke, his Plavix was continued, but ASA was increased to 336m po qd.   LKW: 0630 TNK given?: yes.  IR Thrombectomy? No, no LVO.  MRS: 4  NIHSS:  LOC: 1 Questions: 2 Commands: 2 Eye:0 Visual fields: 0 Face:1 LUE: 2 RUE: 2 LLE: 2 RLE:2 Ataxia: 2 Sensation:0 Language: 2 Dysarthria: 2 Extinction/Inattention:0 Total: 20  ROS: A robust ROS was unable to be performed due to emergent nature of event.   Past Medical History:  Diagnosis Date   [place]    Dementia ([place]    Diabetic peripheral neuropathy ([place]    a. both feet   ETOH abuse    a. 04/2014 18-24 beers q weekend.   H/O echocardiogram    a. 04/2014 Echo: EF 55-60%, no rwma.   Hyperlipidemia    Hypertension    Sarcoma ([place]    a. R leg   Stroke ([place]    a. 04/2014 multiple bateral infarcts, presumed to be embolic;  b. 48/3662Carotid U/S: 1-39% bilat ICA stenosis.   TIA (transient ischemic attack)    a. 08/2010.   [place]     Family History  Problem Relation Age of Onset   Cancer Mother    Cancer Father    Diabetes Brother    Social History:  reports that he has  never smoked. He has never used smokeless tobacco. He reports current alcohol use of about 6.0 standard drinks per week. He reports that he does not use drugs.   Prior to Admission medications   Medication Sig Start Date End Date Taking? Authorizing Provider  acetaminophen (TYLENOL) 325 MG tablet Take 2 tablets (650 mg total) by mouth every 6 (six) hours as needed for mild pain (or Fever >/= 101). 07/10/14   KBonnielee Haff MD  aspirin 325 MG tablet Take 1 tablet (325 mg total) by mouth daily. 07/10/14   KBonnielee Haff MD  atorvastatin (LIPITOR) 40 MG tablet Take 1 tablet (40 mg total) by mouth daily at 6 PM. 04/21/14   SThurnell Lose MD  bisacodyl (DULCOLAX) 10 MG suppository Place 1 suppository (10 mg total) rectally daily as needed for moderate constipation. 07/10/14   KBonnielee Haff MD  Blood Glucose Monitoring Suppl (RELION CONFIRM GLUCOSE MONITOR) W/DEVICE KIT Use as instructed. 12/14/11   CHoyt Koch MD  carvedilol (COREG) 6.25 MG tablet Take 1 tablet (6.25 mg total) by mouth 2 (two) times daily with a meal. 04/21/14   SThurnell Lose MD  clopidogrel (PLAVIX) 75 MG tablet Take 1 tablet (75 mg  total) by mouth daily. 04/21/14   Thurnell Lose, MD  docusate sodium (COLACE) 100 MG capsule Take 1 capsule (100 mg total) by mouth 2 (two) times daily. 07/10/14   Bonnielee Haff, MD  famotidine (PEPCID) 20 MG tablet Take 1 tablet (20 mg total) by mouth daily. 07/10/14   Bonnielee Haff, MD  glimepiride (AMARYL) 1 MG tablet Take 1 mg by mouth daily with breakfast.    [provider]  isosorbide-hydrALAZINE (BIDIL) 20-37.5 MG per tablet Take 1 tablet by mouth 2 (two) times daily. 07/10/14   Bonnielee Haff, MD  loperamide (IMODIUM) 2 MG capsule Take 2 mg by mouth as needed for diarrhea or loose stools.    [provider]  pregabalin (LYRICA) 50 MG capsule Take 1 capsule (50 mg total) by mouth 2 (two) times daily. 11/21/11   Hoyt Koch, MD   sulfamethoxazole-trimethoprim (BACTRIM DS,SEPTRA DS) 800-160 MG per tablet Take 1 tablet by mouth 2 (two) times daily. For 5 more days. Stop after last dose on 07/14/14. 07/10/14   Bonnielee Haff, MD  tamsulosin (FLOMAX) 0.4 MG CAPS capsule Take 0.4 mg by mouth daily.    [provider]  tobramycin (TOBREX) 0.3 % ophthalmic solution Place 2 drops into the left eye 3 (three) times daily. For 5 days ending 06-07-15 06/02/15   [provider]  Vitamin D, Ergocalciferol, (DRISDOL) 50000 units CAPS capsule Take 50,000 Units by mouth every Tuesday.    [provider]   Exam: Current vital signs: BP (!) 145/70   Pulse 69   Temp (!) 97.4 F (36.3 C) (Axillary)   Resp 17   Wt 69 kg   SpO2 98%   BMI 21.83 kg/m   Physical Exam  Constitutional: Poor but chronically ill appearing frail, elderly male.    Psych: Affect appropriate to situation. Eyes: No scleral injection. HENT: No OP obstruction. Head: Normocephalic/Atraumatic.   Cardiovascular: Normal rate and regular rhythm on telemetry.  No LE edema.  Respiratory: Effort normal.  GI: Soft.  No distension. There is no tenderness.  Skin: WDI. Wounds covered with dressing on both feet. Skin tear RLE with brownish red coloration at site. Old graft scar to right thigh.   Neuro: Mental Status: Patient is awake.  Disoriented x 4.  Patient is unable to give a clear and coherent history. + Aphasia.  Cranial Nerves: Patient with expressive and expressive aphasia and unable to participate in most of exam in CT scanner. This improves a lot after  TNK when assessed 15 mins later.   Later exam in ED shows:   II: Visual Fields are full. Pupils are equal, round, and reactive to light.  III,IV, VI: EOMI without ptosis or diplopia.  V: Facial sensation is symmetric to light touch.  VII: Facial movement is symmetric. Right lower facial droop.  VIII: hearing is intact to voice. X: Uvula is midline and palate elevates  symmetrically. XI: Shoulder shrug is symmetric. XII: tongue is midline without atrophy or fasciculations.  Motor: RUE: 5/5.  LUE: 5/5.  LLE: Effort against gravity.  RLE: Effort against gravity.       LE exam tainted by pain.  Tone is normal. Bulk is decreased.  Sensory: Sensation is symmetric to light touch in the UE/LEs. Plantars: Toes are downgoing bilaterally. Cerebellar: No drift to UEs.  Gait:  Deferred.   I have reviewed labs in epic and the pertinent results are: INR  1.0.   aPTT 28.  Glucose  185.  MD reviewed images: CTH No acute finding. Atrophy with chronic perforator and right superior cerebellar infarcts. MRI brain Ordered.  CTA head and neck No emergent large vessel occlusion in the head or neck. Calcified atherosclerotic plaque in the bilateral carotid bulbs resulting in up to 60-70% stenosis on the right and no hemodynamically significant stenosis on the left. Intracranial atherosclerotic disease as detailed above resulting in moderate to severe stenosis of the right intracranial ICA, focal stenosis at the origin of a superior left M2 division, and mild irregularity throughout the remainder of the intracranial vasculature.  Assessment: 75 yo male with stroke risk factors of prior stroke, DM II, HLD, and HTN. CT imaging was negative for stroke, but he has atherosclerosis on CTA which warrants 90 days of Plavix. However, he is already taking it every day. Patients exam improved very much after TNK. He will be admitted for stroke work up for close monitoring in ICU.   PLAN:  -Neurology admit to ICU.  -bleeding precautions.  -No compressive sticks or tube placement x 24 hours.  CNS -Close neuro monitoring.  -MRI brain in 12 hours.  -NIHSS per protocol  -Frequent neuro checks.  -Avoid hyperthermia, Tylenol prn.  SPEECH -Believe some of this to be chronic from old stroke.  -NPO until SLP evaluation.  POST TNK -close monitoring for bleeding.   -CTH stat in 6 hours and anytime stat for changes in mental status or neuro exam.  -No compressive sticks x 24 hours.  -Bedrest, HOB 30 Degrees.  CV -Tele to monitor for arrhythmia.  -Echocardiogram.  -Lipid panel in am. Goal under 70. Change or raise dose of statin if over 70.  -BP goal less than 180/105. Labetalol prn then Cleviprex.  ENDOCRINE:  -Continue home meds when patient can swallow.  -SSI for now.  CKD -monitor UO.  -BMP in a.m.  PROPHYLAXIS DVT: SCDs.  GI: Protonix.  Bowel: Senna.    This patient is critically ill and at significant risk of neurological worsening, death and care requires constant monitoring of vital signs, hemodynamics,respiratory and cardiac monitoring, neurological assessment, discussion with family, other specialists and medical decision making of high complexity. I spent 73 minutes of neurocritical care time  in the care of  this patient. This was time spent independent of any time provided by nurse practitioner or PA.  Patient seen by Clance Boll, MSN, APN-BC and MD. Note/plan to be edited by MD.     Attending Attestation:  Patient seen, examined, labs,vitals and notes reviewed. Discussed plan with Clance Boll, NP and agree with assessment and plan as documented above. I have independently reviewed the chart, obtained history, review of systems and examined the patient.  The patient's baseline function was not clear and his deficits seemed to be significantly disabling and the decision was made to move forward with tNK. On reevaluation after tNK his deficits resolved and his speech became fluent with intact comprehension and repetition, albeit he is edentulous.  Electronically signed by:  Lynnae Sandhoff, MD Page: [phone] 10/23/2020, 11:32 AM

## 2020-10-23 NOTE — ED Provider Notes (Signed)
[facility] EMERGENCY DEPARTMENT Provider Note   CSN: [id number] Arrival date & time: 10/23/20  3007  An emergency department physician performed an initial assessment on this suspected stroke patient at 0730.  History Chief Complaint  Patient presents with   Code Stroke    AHIJAH DEVERY is a 75 y.o. male.  HPI Level 5 caveat secondary to ams 75 yo male ho cva, dementia, hypertension presents via ems from [facility] with report of sudden onset of ams.  He was up and doing his usual activity including dressing himself with assistance when he became unresponsive in chair.  EMS reports initial bs at 240 and he has become more responsive with eye opening.  He seemed weaker on the right than left.  No reported prior deficits.     Past Medical History:  Diagnosis Date   CVA (cerebral vascular accident) ([place])    Dementia ([place])    Diabetic peripheral neuropathy ([place])    a. both feet   ETOH abuse    a. 04/2014 18-24 beers q weekend.   H/O echocardiogram    a. 04/2014 Echo: EF 55-60%, no rwma.   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 04/2014 multiple bateral infarcts, presumed to be embolic;  b. 06/2261 Carotid U/S: 1-39% bilat ICA stenosis.   TIA (transient ischemic attack)    a. 08/2010.   Type II diabetes mellitus [facility])     Patient Active Problem List   Diagnosis Date Noted   Cerebral thrombosis with cerebral infarction ([place]) 07/07/2014   Acute CVA (cerebrovascular accident) ([place]) 07/07/2014   Altered mental status 07/06/2014   Syncopal episodes 07/06/2014   Left sided abdominal pain 07/06/2014   Syncope and collapse 07/06/2014   Cerebellar infarct ([place]) 04/21/2014   Aspiration pneumonia ([place]) 04/20/2014   Acute kidney injury ([place]) 04/20/2014   Speech abnormality    Stroke ([place]) 33/54/5625   Embolic stroke ([place]) 63/89/3734   Type II diabetes mellitus with neurological manifestations ([place]) 08/25/2010   HTN  (hypertension) 08/25/2010   Hyperlipidemia 08/25/2010    Past Surgical History:  Procedure Laterality Date   LOOP RECORDER IMPLANT N/A 04/20/2014   Procedure: LOOP RECORDER IMPLANT;  Surgeon: Thompson Grayer, MD;  Location: [facility] CATH LAB;  Service: Cardiovascular;  Laterality: N/A;   repair of R hand after trauma     sarcoma removal     TEE WITHOUT CARDIOVERSION N/A 04/20/2014   Procedure: TRANSESOPHAGEAL ECHOCARDIOGRAM (TEE);  Surgeon: Larey Dresser, MD;  Location: [facility] ENDOSCOPY;  Service: Cardiovascular;  Laterality: N/A;       Family History  Problem Relation Age of Onset   Cancer Mother    Cancer Father    Diabetes Brother     Social History   Tobacco Use   Smoking status: Never   Smokeless tobacco: Never  Substance Use Topics   Alcohol use: Yes    Alcohol/week: 6.0 standard drinks    Types: 6 Standard drinks or equivalent per week    Comment: 18-24 beers every weekend.   Drug use: No    Home Medications Prior to Admission medications   Medication Sig Start Date End Date Taking? Authorizing Provider  acetaminophen (TYLENOL) 325 MG tablet Take 2 tablets (650 mg total) by mouth every 6 (six) hours as needed for mild pain (or Fever >/= 101). 07/10/14   Bonnielee Haff, MD  aspirin 325 MG tablet Take 1  tablet (325 mg total) by mouth daily. 07/10/14   Bonnielee Haff, MD  atorvastatin (LIPITOR) 40 MG tablet Take 1 tablet (40 mg total) by mouth daily at 6 PM. 04/21/14   Thurnell Lose, MD  bisacodyl (DULCOLAX) 10 MG suppository Place 1 suppository (10 mg total) rectally daily as needed for moderate constipation. 07/10/14   Bonnielee Haff, MD  Blood Glucose Monitoring Suppl (RELION CONFIRM GLUCOSE MONITOR) W/DEVICE KIT Use as instructed. 12/14/11   Hoyt Koch, MD  carvedilol (COREG) 6.25 MG tablet Take 1 tablet (6.25 mg total) by mouth 2 (two) times daily with a meal. 04/21/14   Thurnell Lose, MD  clopidogrel (PLAVIX) 75 MG tablet Take 1 tablet (75 mg total) by mouth  daily. 04/21/14   Thurnell Lose, MD  docusate sodium (COLACE) 100 MG capsule Take 1 capsule (100 mg total) by mouth 2 (two) times daily. 07/10/14   Bonnielee Haff, MD  famotidine (PEPCID) 20 MG tablet Take 1 tablet (20 mg total) by mouth daily. 07/10/14   Bonnielee Haff, MD  glimepiride (AMARYL) 1 MG tablet Take 1 mg by mouth daily with breakfast.    [provider]  isosorbide-hydrALAZINE (BIDIL) 20-37.5 MG per tablet Take 1 tablet by mouth 2 (two) times daily. 07/10/14   Bonnielee Haff, MD  loperamide (IMODIUM) 2 MG capsule Take 2 mg by mouth as needed for diarrhea or loose stools.    [provider]  pregabalin (LYRICA) 50 MG capsule Take 1 capsule (50 mg total) by mouth 2 (two) times daily. 11/21/11   Hoyt Koch, MD  sulfamethoxazole-trimethoprim (BACTRIM DS,SEPTRA DS) 800-160 MG per tablet Take 1 tablet by mouth 2 (two) times daily. For 5 more days. Stop after last dose on 07/14/14. 07/10/14   Bonnielee Haff, MD  tamsulosin (FLOMAX) 0.4 MG CAPS capsule Take 0.4 mg by mouth daily.    [provider]  tobramycin (TOBREX) 0.3 % ophthalmic solution Place 2 drops into the left eye 3 (three) times daily. For 5 days ending 06-07-15 06/02/15   [provider]  Vitamin D, Ergocalciferol, (DRISDOL) 50000 units CAPS capsule Take 50,000 Units by mouth every Tuesday.    [provider]    Allergies    Patient has no known allergies.  Review of Systems   Review of Systems  Unable to perform ROS: Acuity of condition   Physical Exam Updated Vital Signs BP (!) 153/65   Pulse 69   Temp (!) 97.4 F (36.3 C) (Axillary)   Resp 20   Wt 69 kg   SpO2 95%   BMI 21.83 kg/m   Physical Exam Vitals and nursing note reviewed.  Constitutional:      Appearance: Normal appearance.  HENT:     Head: Normocephalic.     Right Ear: External ear normal.     Left Ear: External ear normal.     Nose: Nose normal.     Mouth/Throat:     Pharynx: Oropharynx is  clear.  Eyes:     Pupils: Pupils are equal, round, and reactive to light.  Cardiovascular:     Rate and Rhythm: Normal rate and regular rhythm.     Pulses: Normal pulses.  Pulmonary:     Effort: Pulmonary effort is normal.     Breath sounds: Normal breath sounds.  Abdominal:     General: Abdomen is flat. There is distension.     Palpations: Abdomen is soft.     Tenderness: There is no abdominal tenderness.  Musculoskeletal:        General: Normal range of motion.     Cervical back: Normal range of motion.  Skin:    General: Skin is warm and dry.     Findings: Lesion present.     Comments: Multiple pressure ulcers lower extremity with some erythema around 1 on right lower leg dressings in place several pressure ulcers on right and left foot.  Neurological:     Mental Status: He is alert.     Comments: On initial exam patient had some weakness of bilateral upper and lower extremities that appear greater on the right than the left After patient had lytics, patient appears to have equal strength throughout.  Psychiatric:        Mood and Affect: Mood normal.        Behavior: Behavior normal.    ED Results / Procedures / Treatments   Labs (all labs ordered are listed, but only abnormal results are displayed) Labs Reviewed  CBC - Abnormal; Notable for the following components:      Result Value   RBC 3.15 (*)    Hemoglobin 8.9 (*)    HCT 28.5 (*)    All other components within normal limits  COMPREHENSIVE METABOLIC PANEL - Abnormal; Notable for the following components:   Glucose, Bld 191 (*)    BUN 27 (*)    Creatinine, Ser 3.10 (*)    Calcium 7.7 (*)    Total Protein 4.9 (*)    Albumin 1.9 (*)    AST 11 (*)    GFR, Estimated 20 (*)    All other components within normal limits  I-STAT CHEM 8, ED - Abnormal; Notable for the following components:   BUN 26 (*)    Creatinine, Ser 3.00 (*)    Glucose, Bld 185 (*)    Calcium, Ion 1.08 (*)    Hemoglobin 8.5 (*)    HCT 25.0  (*)    All other components within normal limits  CBG MONITORING, ED - Abnormal; Notable for the following components:   Glucose-Capillary 153 (*)    All other components within normal limits  PROTIME-INR  APTT  DIFFERENTIAL    EKG None  Radiology CT HEAD CODE STROKE WO CONTRAST  Result Date: 10/23/2020 CLINICAL DATA:  Code stroke. EXAM: CT HEAD WITHOUT CONTRAST TECHNIQUE: Contiguous axial images were obtained from the base of the skull through the vertex without intravenous contrast. COMPARISON:  07/11/2020 FINDINGS: Brain: No evidence of acute infarction, hemorrhage, hydrocephalus, extra-axial collection or mass lesion/mass effect. Generalized atrophy with ventriculomegaly. Remote perforator infarcts at the bilateral basal ganglia. Remote right superior cerebellar infarct. Vascular: No hyperdense vessel or unexpected calcification. Skull: Normal. Negative for fracture or focal lesion. Sinuses/Orbits: No acute finding. Other: These results were communicated to Dr. Theda Sers at 7:47 am on 10/23/2020 by text page via the [facility] messaging system. ASPECTS [facility] Stroke Program Early CT Score) Not scored without localizing history. IMPRESSION: 1. No acute finding. 2. Atrophy with chronic perforator and right superior cerebellar infarcts. Electronically Signed   By: Jorje Guild M.D.   On: 10/23/2020 07:48   CT ANGIO HEAD CODE STROKE  Result Date: 10/23/2020 CLINICAL DATA:  Neuro deficit, code stroke EXAM: CT ANGIOGRAPHY HEAD AND NECK TECHNIQUE: Multidetector CT imaging of the head and neck was performed using the standard protocol during bolus administration of intravenous contrast. Multiplanar CT image reconstructions and MIPs were obtained to evaluate the vascular anatomy. Carotid stenosis measurements (when applicable) are obtained utilizing  NASCET criteria, using the distal internal carotid diameter as the denominator. CONTRAST:  145m OMNIPAQUE IOHEXOL 350 MG/ML SOLN COMPARISON:  Same-day  noncontrast CT head, brain MRI 07/06/2014, CT cervical spine 06/03/2020 FINDINGS: CTA NECK FINDINGS Aortic arch: There is mild calcified atherosclerotic plaque of the aortic arch. There is mild narrowing of the left subclavian artery at the level of the origin of the internal mammary artery. Right carotid system: There is soft and calcified atherosclerotic plaque in the proximal left internal carotid artery resulting in up to 60-70% stenosis. The distal internal carotid artery is patent. There is no dissection or aneurysm. Left carotid system: There is scattered soft plaque throughout the common carotid artery without hemodynamically significant stenosis. There is soft and calcified atherosclerotic plaque in the proximal left internal carotid artery without hemodynamically significant stenosis. There is no dissection or aneurysm. Vertebral arteries: There is calcified atherosclerotic plaque at the origin of the right vertebral artery resulting in at least moderate stenosis. The remainder of the right vertebral artery is patent. The left vertebral artery is dominant. There is minimal calcified atherosclerotic plaque at the V2/V3 junction without significant stenosis. There is no dissection or aneurysm. Skeleton: There is multilevel degenerative change of the cervical spine, most advanced at C5-C6 through C7-T1. There is no acute osseous abnormality or aggressive osseous lesion. Other neck: The soft tissues are unremarkable. Upper chest: The lung apices are clear. Review of the MIP images confirms the above findings CTA HEAD FINDINGS Anterior circulation: There is calcified atherosclerotic plaque of the bilateral intracranial ICAs resulting in focal moderate to severe stenosis on the right (8-76), and mild stenosis on the left. There is mild irregularity of the right M1 and M2 branches due to atherosclerotic disease without focal high-grade stenosis or occlusion. There is focal stenosis at the origin of a superior  left M2 division (11-16). There is mild irregularity of the distal left MCA branches without other significant stenosis The right A1 segment is hypoplastic, a normal variant. Otherwise, the bilateral ACAs are patent. There is no aneurysm. Posterior circulation: There is a PICA termination of the right vertebral artery, a normal variant. The left V4 segment is patent. The basilar artery is patent. There is a fetal PCA on the right with mild multifocal irregularity of the posterior communicating artery. There is mild multifocal irregularity of the left P1 segment. There is no high-grade stenosis or occlusion. Venous sinuses: As permitted by contrast timing, patent. Anatomic variants: As above. Review of the MIP images confirms the above findings IMPRESSION: 1. No emergent large vessel occlusion in the head or neck. 2. Calcified atherosclerotic plaque in the bilateral carotid bulbs resulting in up to 60-70% stenosis on the right and no hemodynamically significant stenosis on the left. 3. Intracranial atherosclerotic disease as detailed above resulting in moderate to severe stenosis of the right intracranial ICA, focal stenosis at the origin of a superior left M2 division, and mild irregularity throughout the remainder of the intracranial vasculature. Electronically Signed   By: PValetta MoleM.D.   On: 10/23/2020 08:19   CT ANGIO NECK CODE STROKE  Result Date: 10/23/2020 CLINICAL DATA:  Neuro deficit, code stroke EXAM: CT ANGIOGRAPHY HEAD AND NECK TECHNIQUE: Multidetector CT imaging of the head and neck was performed using the standard protocol during bolus administration of intravenous contrast. Multiplanar CT image reconstructions and MIPs were obtained to evaluate the vascular anatomy. Carotid stenosis measurements (when applicable) are obtained utilizing NASCET criteria, using the distal internal carotid diameter as the denominator.  CONTRAST:  170m OMNIPAQUE IOHEXOL 350 MG/ML SOLN COMPARISON:  Same-day  noncontrast CT head, brain MRI 07/06/2014, CT cervical spine 06/03/2020 FINDINGS: CTA NECK FINDINGS Aortic arch: There is mild calcified atherosclerotic plaque of the aortic arch. There is mild narrowing of the left subclavian artery at the level of the origin of the internal mammary artery. Right carotid system: There is soft and calcified atherosclerotic plaque in the proximal left internal carotid artery resulting in up to 60-70% stenosis. The distal internal carotid artery is patent. There is no dissection or aneurysm. Left carotid system: There is scattered soft plaque throughout the common carotid artery without hemodynamically significant stenosis. There is soft and calcified atherosclerotic plaque in the proximal left internal carotid artery without hemodynamically significant stenosis. There is no dissection or aneurysm. Vertebral arteries: There is calcified atherosclerotic plaque at the origin of the right vertebral artery resulting in at least moderate stenosis. The remainder of the right vertebral artery is patent. The left vertebral artery is dominant. There is minimal calcified atherosclerotic plaque at the V2/V3 junction without significant stenosis. There is no dissection or aneurysm. Skeleton: There is multilevel degenerative change of the cervical spine, most advanced at C5-C6 through C7-T1. There is no acute osseous abnormality or aggressive osseous lesion. Other neck: The soft tissues are unremarkable. Upper chest: The lung apices are clear. Review of the MIP images confirms the above findings CTA HEAD FINDINGS Anterior circulation: There is calcified atherosclerotic plaque of the bilateral intracranial ICAs resulting in focal moderate to severe stenosis on the right (8-76), and mild stenosis on the left. There is mild irregularity of the right M1 and M2 branches due to atherosclerotic disease without focal high-grade stenosis or occlusion. There is focal stenosis at the origin of a superior  left M2 division (11-16). There is mild irregularity of the distal left MCA branches without other significant stenosis The right A1 segment is hypoplastic, a normal variant. Otherwise, the bilateral ACAs are patent. There is no aneurysm. Posterior circulation: There is a PICA termination of the right vertebral artery, a normal variant. The left V4 segment is patent. The basilar artery is patent. There is a fetal PCA on the right with mild multifocal irregularity of the posterior communicating artery. There is mild multifocal irregularity of the left P1 segment. There is no high-grade stenosis or occlusion. Venous sinuses: As permitted by contrast timing, patent. Anatomic variants: As above. Review of the MIP images confirms the above findings IMPRESSION: 1. No emergent large vessel occlusion in the head or neck. 2. Calcified atherosclerotic plaque in the bilateral carotid bulbs resulting in up to 60-70% stenosis on the right and no hemodynamically significant stenosis on the left. 3. Intracranial atherosclerotic disease as detailed above resulting in moderate to severe stenosis of the right intracranial ICA, focal stenosis at the origin of a superior left M2 division, and mild irregularity throughout the remainder of the intracranial vasculature. Electronically Signed   By: PValetta MoleM.D.   On: 10/23/2020 08:19    Procedures .Critical Care Performed by: RPattricia Boss MD Authorized by: RPattricia Boss MD   Critical care provider statement:    Critical care time (minutes):  30   Critical care was necessary to treat or prevent imminent or life-threatening deterioration of the following conditions:  CNS failure or compromise   Critical care was time spent personally by me on the following activities:  Evaluation of patient's response to treatment, ordering and performing treatments and interventions, ordering and review of laboratory studies, ordering  and review of radiographic studies, pulse oximetry,  re-evaluation of patient's condition and review of old charts   Medications Ordered in ED Medications  sodium chloride flush (NS) 0.9 % injection 3 mL (3 mLs Intravenous Given 10/23/20 0801)  iohexol (OMNIPAQUE) 350 MG/ML injection 100 mL (100 mLs Intravenous Contrast Given 10/23/20 0757)  tenecteplase (TNKASE) injection for Stroke 17 mg (17 mg Intravenous Given 10/23/20 0800)    ED Course  I have reviewed the triage vital signs and the nursing notes.  Pertinent labs & imaging results that were available during my care of the patient were reviewed by me and considered in my medical decision making (see chart for details).    MDM Rules/Calculators/A&P                           1- ams- felt to be secondary to stroke.  Symptoms improved withTKA  2-CKD with creatinine at 3 today with first prior of 2.53 on 7.4/22 3- anemia- hgb 8.9 today with first prior of 10.2 on 07/11/20- ? Due to ckd 4-dementia - now appears at baseline 5 patient with history of diabetes with hyperglycemia 1 85-1 91 here no report of hypoglycemia Final Clinical Impression(s) / ED Diagnoses Final diagnoses:  Cerebrovascular accident (CVA), unspecified mechanism ([place])    Rx / [place] Orders ED Discharge Orders     None        Pattricia Boss, MD 10/23/20 1123

## 2020-10-23 NOTE — Progress Notes (Signed)
PHARMACIST CODE STROKE RESPONSE  Notified to mix TNK at 0755  by Dr. Theda Sers Delivered TNK to RN at 0757  TNK dose = 17 mg IV over 5 seconds  Issues/delays encountered (if applicable): noneCollins  Hayden Mckinney 10/23/20 8:04 AM

## 2020-10-24 ENCOUNTER — Inpatient Hospital Stay (HOSPITAL_COMMUNITY): Payer: Medicare Other

## 2020-10-24 DIAGNOSIS — R479 Unspecified speech disturbances: Secondary | ICD-10-CM

## 2020-10-24 DIAGNOSIS — E7800 Pure hypercholesterolemia, unspecified: Secondary | ICD-10-CM

## 2020-10-24 DIAGNOSIS — R4182 Altered mental status, unspecified: Secondary | ICD-10-CM

## 2020-10-24 DIAGNOSIS — Z8673 Personal history of transient ischemic attack (TIA), and cerebral infarction without residual deficits: Secondary | ICD-10-CM

## 2020-10-24 DIAGNOSIS — E11649 Type 2 diabetes mellitus with hypoglycemia without coma: Secondary | ICD-10-CM

## 2020-10-24 DIAGNOSIS — G9341 Metabolic encephalopathy: Secondary | ICD-10-CM

## 2020-10-24 LAB — GLUCOSE, CAPILLARY
Glucose-Capillary: 103 mg/dL — ABNORMAL HIGH (ref 70–99)
Glucose-Capillary: 107 mg/dL — ABNORMAL HIGH (ref 70–99)
Glucose-Capillary: 154 mg/dL — ABNORMAL HIGH (ref 70–99)
Glucose-Capillary: 192 mg/dL — ABNORMAL HIGH (ref 70–99)
Glucose-Capillary: 136 mg/dL — ABNORMAL HIGH (ref 70–99)

## 2020-10-24 LAB — LIPID PANEL
Cholesterol: 176 mg/dL (ref 0–200)
HDL: 36 mg/dL — ABNORMAL LOW (ref >40)
LDL Cholesterol: 107 mg/dL — ABNORMAL HIGH (ref 0–99)
Total CHOL/HDL Ratio: 4.9 RATIO
Triglycerides: 166 mg/dL — ABNORMAL HIGH (ref <150)
VLDL: 33 mg/dL (ref 0–40)

## 2020-10-24 MED ORDER — CLOPIDOGREL BISULFATE 75 MG PO TABS
75.0000 mg | ORAL_TABLET | Freq: Every day | ORAL | Status: DC
  Administered 2020-10-24 – 2020-10-25 (×2): 75 mg via ORAL
  Filled 2020-10-24 (×2): qty 1

## 2020-10-24 MED ORDER — ESCITALOPRAM OXALATE 10 MG PO TABS
10.0000 mg | ORAL_TABLET | Freq: Every day | ORAL | Status: DC
  Administered 2020-10-24 – 2020-10-25 (×2): 10 mg via ORAL
  Filled 2020-10-24 (×2): qty 1

## 2020-10-24 MED ORDER — ISOSORB DINITRATE-HYDRALAZINE 20-37.5 MG PO TABS
1.0000 | ORAL_TABLET | Freq: Two times a day (BID) | ORAL | Status: DC
  Administered 2020-10-24 – 2020-10-25 (×3): 1 via ORAL
  Filled 2020-10-24 (×4): qty 1

## 2020-10-24 MED ORDER — ENOXAPARIN SODIUM 30 MG/0.3ML IJ SOSY
30.0000 mg | PREFILLED_SYRINGE | INTRAMUSCULAR | Status: DC
  Administered 2020-10-24 – 2020-10-25 (×2): 30 mg via SUBCUTANEOUS
  Filled 2020-10-24 (×2): qty 0.3

## 2020-10-24 MED ORDER — CARVEDILOL 6.25 MG PO TABS
6.2500 mg | ORAL_TABLET | Freq: Two times a day (BID) | ORAL | Status: DC
  Administered 2020-10-24: 3.125 mg via ORAL
  Administered 2020-10-25: 6.25 mg via ORAL
  Filled 2020-10-24 (×2): qty 2
  Filled 2020-10-24: qty 1

## 2020-10-24 MED ORDER — ATORVASTATIN CALCIUM 80 MG PO TABS
80.0000 mg | ORAL_TABLET | Freq: Every day | ORAL | Status: DC
  Filled 2020-10-24: qty 1

## 2020-10-24 MED ORDER — ATORVASTATIN CALCIUM 40 MG PO TABS: 40.0000 mg | ORAL_TABLET | Freq: Every day | ORAL | Status: DC

## 2020-10-24 MED ORDER — ASPIRIN EC 81 MG PO TBEC
81.0000 mg | DELAYED_RELEASE_TABLET | Freq: Every day | ORAL | Status: DC
  Administered 2020-10-24 – 2020-10-25 (×2): 81 mg via ORAL
  Filled 2020-10-24 (×2): qty 1

## 2020-10-24 MED ORDER — ENSURE ENLIVE PO LIQD
237.0000 mL | Freq: Three times a day (TID) | ORAL | Status: DC
  Administered 2020-10-25 (×2): 237 mL via ORAL

## 2020-10-24 MED ORDER — ENOXAPARIN SODIUM 40 MG/0.4ML IJ SOSY: 40.0000 mg | PREFILLED_SYRINGE | INTRAMUSCULAR | Status: DC

## 2020-10-24 MED ORDER — FAMOTIDINE 20 MG PO TABS
20.0000 mg | ORAL_TABLET | Freq: Every day | ORAL | Status: DC
  Administered 2020-10-24 – 2020-10-25 (×2): 20 mg via ORAL
  Filled 2020-10-24 (×2): qty 1

## 2020-10-24 MED ORDER — ADULT MULTIVITAMIN W/MINERALS CH
1.0000 | ORAL_TABLET | Freq: Every day | ORAL | Status: DC
  Administered 2020-10-24 – 2020-10-25 (×2): 1 via ORAL
  Filled 2020-10-24 (×2): qty 1

## 2020-10-24 NOTE — Evaluation (Signed)
Occupational Therapy Evaluation Patient Details Name: Hayden Mckinney MRN: [id number] DOB: [birth year] Today's Date: 10/24/2020   History of Present Illness 75 y.o. male who resides in a SNF and presents as a code stroke via EMS. +TNK. MRI (-) acute infarct; chronic infarcts B BG, CC B thalami and Pons. 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 Pt states at baseline he is able to transfer himself to his wc and staff assists with ADL tasks - unsure of reliability. Pt required Max A squat pivot to chair due to weakness and B knee flexion contractures. Recommend return to SNF with rehab to facilitate return to baseline level fo function. Will follow acutely.       Recommendations for follow up therapy are one component of a multi-disciplinary discharge planning process, led by the attending physician.  Recommendations may be updated based on patient status, additional functional criteria and insurance authorization.   Follow Up Recommendations  SNF    Equipment Recommendations  None recommended by OT    Recommendations for Other Services       Precautions / Restrictions Precautions Precautions: Fall Precaution Comments: LE Wounds; B knee flexion contractures      Mobility Bed Mobility Overal bed mobility: Needs Assistance Bed Mobility: Supine to Sit     Supine to sit: Min assist          Transfers Overall transfer level: Needs assistance Equipment used: 1 person hand held assist Transfers: Squat Pivot Transfers     Squat pivot transfers: Max assist (toward L)          Balance Overall balance assessment: Needs assistance   Sitting balance-Leahy Scale: Fair       Standing balance-Leahy Scale: Zero                             ADL either performed or assessed with clinical  judgement   ADL Overall ADL's : Needs assistance/impaired     Grooming: Set up;Supervision/safety   Upper Body Bathing: Minimal assistance;Sitting   Lower Body Bathing: Maximal assistance;Bed level   Upper Body Dressing : Moderate assistance   Lower Body Dressing: Maximal assistance   Toilet Transfer: Maximal assistance;+2 for safety/equipment;Squat-pivot   Toileting- Clothing Manipulation and Hygiene: Maximal assistance       Functional mobility during ADLs: Maximal assistance;+2 for safety/equipment       Vision   Additional Comments: deficits at baseline     Perception     Praxis      Pertinent Vitals/Pain Pain Assessment: Faces Faces Pain Scale: Hurts little more Pain Location: with B knee ROM Pain Descriptors / Indicators: Discomfort;Grimacing Pain Intervention(s): Limited activity within patient's tolerance     Hand Dominance Right (however priomarily uses L)   Extremity/Trunk Assessment Upper Extremity Assessment Upper Extremity Assessment:  (B generalized weakness; apparent sensory motor deficits with ataxic movements; able to use functionally)   Lower Extremity Assessment Lower Extremity Assessment:  (B knee flexion contractures)   Cervical / Trunk Assessment Cervical / Trunk Assessment: Kyphotic   Communication Communication Communication: Expressive difficulties   Cognition Arousal/Alertness: Awake/alert Behavior During Therapy: WFL for tasks assessed/performed;Impulsive Overall Cognitive Status: No family/caregiver present to determine baseline cognitive functioning Area of Impairment: Orientation;Attention;Memory;Safety/judgement;Awareness;Problem solving  Orientation Level: Disoriented to;Situation Current Attention Level: Sustained Memory: Decreased short-term memory   Safety/Judgement: Decreased awareness of deficits;Decreased awareness of safety Awareness: Emergent Problem Solving: Slow processing General  Comments: most likely close to baseline   General Comments       Exercises     Shoulder Instructions      Home Living Family/patient expects to be discharged to:: Skilled nursing facility                                        Prior Functioning/Environment Level of Independence: Needs assistance  Gait / Transfers Assistance Needed: per pt he transfers to wc by squat pivot, however unsure of reliability ADL's / Homemaking Assistance Needed: staff assists with ADL tasks Communication / Swallowing Assistance Needed: expressive deficits          OT Problem List: Decreased strength;Decreased range of motion;Decreased activity tolerance;Impaired balance (sitting and/or standing);Decreased cognition;Decreased safety awareness;Pain;Impaired UE functional use      OT Treatment/Interventions: Self-care/ADL training;Therapeutic exercise;Neuromuscular education;DME and/or AE instruction;Therapeutic activities;Cognitive remediation/compensation;Visual/perceptual remediation/compensation;Patient/family education;Balance training    OT Goals(Current goals can be found in the care plan section) Acute Rehab OT Goals Patient Stated Goal: to get some rehab OT Goal Formulation: Patient unable to participate in goal setting Time For Goal Achievement: 11/07/20 Potential to Achieve Goals: Good  OT Frequency: Min 2X/week   Barriers to D/C:            Co-evaluation PT/OT/SLP Co-Evaluation/Treatment: Yes Reason for Co-Treatment: Complexity of the patient's impairments (multi-system involvement);For patient/therapist safety;To address functional/ADL transfers   OT goals addressed during session: ADL's and self-care      AM-PAC OT "6 Clicks" Daily Activity     Outcome Measure Help from another person eating meals?: A Little Help from another person taking care of personal grooming?: A Little Help from another person toileting, which includes using toliet, bedpan, or urinal?: A  Lot Help from another person bathing (including washing, rinsing, drying)?: A Lot Help from another person to put on and taking off regular upper body clothing?: A Lot Help from another person to put on and taking off regular lower body clothing?: A Lot 6 Click Score: 14   End of Session Equipment Utilized During Treatment: Gait belt Nurse Communication: Mobility status  Activity Tolerance: Patient tolerated treatment well Patient left: in chair;with call bell/phone within reach;with chair alarm set  OT Visit Diagnosis: Other abnormalities of gait and mobility (R26.89);Muscle weakness (generalized) (M62.81);Ataxia, unspecified (R27.0);Other symptoms and signs involving cognitive function;Pain Pain - part of body:  (B knee)                Time: 5176-1607 OT Time Calculation (min): 26 min Charges:  OT General Charges $OT Visit: 1 Visit OT Evaluation $OT Eval Moderate Complexity: [place], OT/L   Acute OT Clinical Specialist Acute Rehabilitation Services Pager [phone] Office [phone]   [facility] 10/24/2020, 2:49 PM

## 2020-10-24 NOTE — Progress Notes (Signed)
Pt returned from MRI °

## 2020-10-24 NOTE — TOC CAGE-AID Note (Signed)
Transition of Care [facility]) - CAGE-AID Screening   Patient Details  Name: Hayden Mckinney MRN: [id number] Date of Birth: 02-Jul-1945  Transition of Care [facility]) CM/SW Contact:    Calvert Charland C Tarpley-Carter, [place] Phone Number: 10/24/2020, 2:01 PM   Clinical Narrative: Pt is unable to participate in Cage Aid.   Gayland Nicol Tarpley-Carter, MSW, LCSW-A Pronouns:  She/Her/Hers [facility] Clinical Social Worker Direct Number:  [phone] [email or web address]  CAGE-AID Screening: Substance Abuse Screening unable to be completed due to: : Patient unable to participate             Substance Abuse Education Offered: No

## 2020-10-24 NOTE — Evaluation (Addendum)
Speech Language Pathology Evaluation Patient Details Name: Hayden Mckinney MRN: [id number] DOB: 11/12/1945 Today's Date: 10/24/2020 Time: 1937-9024 SLP Time Calculation (min) (ACUTE ONLY): 14 min  Problem List:  Patient Active Problem List   Diagnosis Date Noted   Cerebral thrombosis with cerebral infarction ([place]) 07/07/2014   Acute CVA (cerebrovascular accident) ([place]) 07/07/2014   Altered mental status 07/06/2014   Syncopal episodes 07/06/2014   Left sided abdominal pain 07/06/2014   Syncope and collapse 07/06/2014   Cerebellar infarct ([place]) 04/21/2014   Aspiration pneumonia ([place]) 04/20/2014   Acute kidney injury ([place]) 04/20/2014   Speech abnormality    Stroke ([place]) 09/73/5329   Embolic stroke ([place]) 92/42/6834   Type II diabetes mellitus with neurological manifestations ([place]) 08/25/2010   HTN (hypertension) 08/25/2010   Hyperlipidemia 08/25/2010   Past Medical History:  Past Medical History:  Diagnosis Date   CVA (cerebral vascular accident) ([place])    Dementia ([place])    Diabetic peripheral neuropathy ([place])    a. both feet   ETOH abuse    a. 04/2014 18-24 beers q weekend.   H/O echocardiogram    a. 04/2014 Echo: EF 55-60%, no rwma.   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 04/2014 multiple bateral infarcts, presumed to be embolic;  b. 01/9620 Carotid U/S: 1-39% bilat ICA stenosis.   TIA (transient ischemic attack)    a. 08/2010.   Type II diabetes mellitus ([place])    Past Surgical History:  Past Surgical History:  Procedure Laterality Date   LOOP RECORDER IMPLANT N/A 04/20/2014   Procedure: LOOP RECORDER IMPLANT;  Surgeon: Thompson Grayer, MD;  Location: [facility] CATH LAB;  Service: Cardiovascular;  Laterality: N/A;   repair of R hand after trauma     sarcoma removal     TEE WITHOUT CARDIOVERSION N/A 04/20/2014   Procedure: TRANSESOPHAGEAL ECHOCARDIOGRAM (TEE);  Surgeon: Larey Dresser, MD;  Location: [facility] ENDOSCOPY;  Service:  Cardiovascular;  Laterality: N/A;   HPI:  75 y.o. M with PMHx significant for remote CVA 2016 w/ dysarthria, non-healing wounds on LEs, peripheral neuropathy, HTN, HLD, DM II, TIA 2012, and dementia presenting to [facility] after found slumped over in wheelchair by SNF staff. Imaging revealed atrophy and progressive white matter microvascular ischemic changes throughout both cerebral hemispheres, remote bilateral basal ganglia and right superior cerebellar infarcts.   Assessment / Plan / Recommendation Clinical Impression  Pt was seen for a speech-language evaluation. Overall, pt presents with moderate dysarthria c/b imprecise articulation with intact language and cognition for all tasks assessed. Pt has dysarthria at baseline from a prior CVA, however reports that speech is more difficult now than before. Speech intelligibilty at the word level ~75% which decreases to ~50% at the conversation level. Oral mechanism exam revealed mild facial and lingual weakness but was otherwise unremarkable. Pt alert and oriented x4 and able to describe current responsibilities/finances at his place of residence and participation in SNF activities. SLP will follow for therapy targeting compensatory strategy implementation for speech intelligibility.    SLP Assessment  SLP Recommendation/Assessment: Patient needs continued Speech [facility] Pathology Services SLP Visit Diagnosis: Dysarthria and anarthria (R47.1)    Recommendations for follow up therapy are one component of a multi-disciplinary discharge planning process, led by the attending physician.  Recommendations may be updated based on patient status, additional functional criteria and insurance authorization.    Follow Up Recommendations  Skilled Nursing facility  Frequency and Duration min 2x/week  2 weeks      SLP Evaluation Cognition  Overall Cognitive Status: Within Functional Limits for tasks assessed Arousal/Alertness: Awake/alert Orientation Level:  Oriented X4 Year: 2022 Day of Week: Correct Attention: Sustained Sustained Attention: Appears intact Memory: Appears intact Awareness: Appears intact Safety/Judgment: Appears intact       Comprehension  Auditory Comprehension Overall Auditory Comprehension: Appears within functional limits for tasks assessed Yes/No Questions: Within Functional Limits Conversation: Complex Visual Recognition/Discrimination Discrimination: Within Function Limits Reading Comprehension Reading Status: Not tested    Expression Expression Primary Mode of Expression: Verbal Verbal Expression Overall Verbal Expression: Appears within functional limits for tasks assessed Initiation: No impairment Level of Generative/Spontaneous Verbalization: Conversation Pragmatics: No impairment Interfering Components: Speech intelligibility Non-Verbal Means of Communication: Not applicable Written Expression Written Expression: Not tested   Oral / Motor  Oral Motor/Sensory Function Overall Oral Motor/Sensory Function: Moderate impairment Facial ROM: Within Functional Limits Facial Symmetry: Within Functional Limits Facial Strength: Reduced right;Reduced left;Suspected CN VII (facial) dysfunction Facial Sensation: Within Functional Limits Lingual ROM: Within Functional Limits Lingual Symmetry: Within Functional Limits Lingual Strength: Reduced;Suspected CN XII (hypoglossal) dysfunction Velum: Within Functional Limits Mandible: Within Functional Limits Motor Speech Overall Motor Speech: Impaired at baseline Respiration: Within functional limits Phonation: Normal Resonance: Within functional limits Articulation: Impaired Level of Impairment: Word Intelligibility: Intelligibility reduced Word: 50-74% accurate Phrase: 50-74% accurate Sentence: 25-49% accurate Conversation: 25-49% accurate Motor Planning: Witnin functional limits Motor Speech Errors: Aware Interfering Components: Premorbid status    GO           Dewitt Rota, SLP-Student          Dewitt Rota 10/24/2020, 11:20 AM

## 2020-10-24 NOTE — TOC Initial Note (Signed)
Transition of Care [facility]) - Initial/Assessment Note    Patient Details  Name: Hayden Mckinney MRN: [id number] Date of Birth: [birth year]  Transition [facility]) CM/SW Contact:    Benard Halsted, LCSW Phone Number: 10/24/2020, 12:05 PM  Clinical Narrative:                 Patient has resided at [facility] under long term care the past few years. He reports not liking the food there and wanting a different facility, however there are no other ltc Medicaid beds available currently. [facility] aware that patient will likely be ready to discharge tomorrow.   Expected Discharge Plan: Skilled Nursing Facility Barriers to Discharge: Continued Medical Work up   Patient Goals and CMS Choice Patient states their goals for this hospitalization and ongoing recovery are:: Return to ltc CMS Medicare.gov Compare Post Acute Care list provided to:: Patient Choice offered to / list presented to : Patient  Expected Discharge Plan and Services Expected Discharge Plan: [place] In-house Referral: Clinical Social Work   Post Acute Care Choice: [place] Living arrangements for the past 2 months: [place]                                      Prior Living Arrangements/Services Living arrangements for the past 2 months: [place] Lives with:: Facility Resident Patient language and need for interpreter reviewed:: Yes Do you feel safe going back to the place where you live?: Yes      Need for Family Participation in Patient Care: Yes (Comment) Care giver support system in place?: Yes (comment)   Criminal Activity/Legal Involvement Pertinent to Current Situation/Hospitalization: No - Comment as needed  Activities of Daily Living Home Assistive Devices/Equipment: Wheelchair ADL Screening (condition at time of admission) Patient's cognitive ability adequate to safely complete daily activities?: No Is the patient deaf or have  difficulty hearing?: No Does the patient have difficulty seeing, even when wearing glasses/contacts?: No Does the patient have difficulty concentrating, remembering, or making decisions?: Yes Patient able to express need for assistance with ADLs?: Yes Does the patient have difficulty dressing or bathing?: Yes Independently performs ADLs?: No Communication: Independent Dressing (OT): Needs assistance Is this a change from baseline?: Pre-admission baseline Grooming: Needs assistance Is this a change from baseline?: Pre-admission baseline Feeding: Needs assistance Is this a change from baseline?: Pre-admission baseline Bathing: Needs assistance Is this a change from baseline?: Pre-admission baseline Toileting: Needs assistance Is this a change from baseline?: Pre-admission baseline In/Out Bed: Needs assistance Is this a change from baseline?: Pre-admission baseline Walks in Home: Needs assistance Is this a change from baseline?: Pre-admission baseline Does the patient have difficulty walking or climbing stairs?: Yes Weakness of Legs: None Weakness of Arms/Hands: None  Permission Sought/Granted Permission sought to share information with : Facility Sport and exercise psychologist, Family Supports Permission granted to share information with : Yes, Verbal Permission Granted  Share Information with NAME: Vivien Rota  Permission granted to share info w AGENCY: [facility]  Permission granted to share info w Relationship: Sister  Permission granted to share info w Contact Information: [phone]  Emotional Assessment Appearance:: Appears stated age Attitude/Demeanor/Rapport: Complaining Affect (typically observed): Appropriate, Frustrated Orientation: : Oriented to Self, Oriented to Place, Oriented to  Time, Oriented to Situation Alcohol / Substance Use: Not Applicable Psych Involvement: No (comment)  Admission diagnosis:  Stroke [facility]) [I63.9] Cerebrovascular accident (CVA),  unspecified  mechanism [facility]) [I63.9] Patient Active Problem List   Diagnosis Date Noted   Cerebral thrombosis with cerebral infarction ([place]) 07/07/2014   Acute CVA (cerebrovascular accident) ([place]) 07/07/2014   Altered mental status 07/06/2014   Syncopal episodes 07/06/2014   Left sided abdominal pain 07/06/2014   Syncope and collapse 07/06/2014   Cerebellar infarct ([place]) 04/21/2014   Aspiration pneumonia ([place]) 04/20/2014   Acute kidney injury ([place]) 04/20/2014   Speech abnormality    Stroke ([place]) 30/07/6224   Embolic stroke ([place]) 33/35/4562   Type II diabetes mellitus with neurological manifestations ([place]) 08/25/2010   HTN (hypertension) 08/25/2010   Hyperlipidemia 08/25/2010   PCP:  Wenda Low, MD Pharmacy:   [facility], [address] [place]. [place] [address] Phone: [phone] Fax: [phone]     Social Determinants of Health (SDOH) Interventions    Readmission Risk Interventions No flowsheet data found.

## 2020-10-24 NOTE — Procedures (Signed)
Patient Name: DASHAWN BARTNICK  MRN: [id number]  Epilepsy Attending: Lora Havens  Referring Physician/Provider: Dr Rosalin Hawking Date: 10/24/2020 Duration: 23.45 mins  Patient history: 75 year old male with altered mental status and speech disturbance.  EEG to evaluate for seizure.  Level of alertness: Awake  AEDs during EEG study: None  Technical aspects: This EEG study was done with scalp electrodes positioned according to the 10-20 International system of electrode placement. Electrical activity was acquired at a sampling rate of 500Hz  and reviewed with a high frequency filter of 70Hz  and a low frequency filter of 1Hz . EEG data were recorded continuously and digitally stored.   Description: The posterior dominant rhythm consists of 7.5Hz  activity of moderate voltage (25-35 uV) seen predominantly in posterior head regions, symmetric and reactive to eye opening and eye closing. EEG showed intermittent generalized 5 to 6 Hz theta-delta slowing. Hyperventilation and photic stimulation were not performed.     ABNORMALITY - Intermittent slow, generalized  IMPRESSION: This study is suggestive of mild diffuse encephalopathy, nonspecific etiology. No seizures or epileptiform discharges were seen throughout the recording.  Ottie Tillery Barbra Sarks

## 2020-10-24 NOTE — NC FL2 (Signed)
[place] LEVEL OF CARE SCREENING TOOL     IDENTIFICATION  Patient Name: Hayden Mckinney Birthdate: 1945/03/15 Sex: male Admission Date (Current Location): 10/23/2020  [facility] and [place] Number:  Herbalist and Address:  [facility] [place], [place] [address] [place], [place] [address]      Provider Number: [id number]  Attending Physician Name and Address:  Stroke, Md, MD  Relative Name and Phone Number:       Current Level of Care: Hospital Recommended Level of Care: [place] Prior Approval Number:    Date Approved/Denied:   PASRR Number: [id number]  Discharge Plan: SNF    Current Diagnoses: Patient Active Problem List   Diagnosis Date Noted   Cerebral thrombosis with cerebral infarction ([place]) 07/07/2014   Acute CVA (cerebrovascular accident) ([place]) 07/07/2014   Altered mental status 07/06/2014   Syncopal episodes 07/06/2014   Left sided abdominal pain 07/06/2014   Syncope and collapse 07/06/2014   Cerebellar infarct ([place]) 04/21/2014   Aspiration pneumonia ([place]) 04/20/2014   Acute kidney injury ([place]) 04/20/2014   Speech abnormality    Stroke ([place]) 17/51/0258   Embolic stroke ([place]) 52/77/8242   Type II diabetes mellitus with neurological manifestations ([place]) 08/25/2010   HTN (hypertension) 08/25/2010   Hyperlipidemia 08/25/2010    Orientation RESPIRATION BLADDER Height & Weight     Self, Time, Situation, Place  Normal Incontinent, External catheter Weight: 148 lb 2.4 oz (67.2 kg) Height:  5\' 9"  (175.3 cm)  BEHAVIORAL SYMPTOMS/MOOD NEUROLOGICAL BOWEL NUTRITION STATUS      Continent Diet (see dc summary)  AMBULATORY STATUS COMMUNICATION OF NEEDS Skin   Extensive Assist Verbally PU Stage and Appropriate Care (unstageable on ankle and toe w/foam dressing; venous stasis ulcer)                       Personal Care Assistance Level of Assistance  Bathing, Feeding, Dressing Bathing Assistance: Maximum  assistance Feeding assistance: Limited assistance Dressing Assistance: Limited assistance     Functional Limitations Info             SPECIAL CARE FACTORS FREQUENCY                       Contractures Contractures Info: Not present    Additional Factors Info  Code Status, Allergies Code Status Info: Full Allergies Info: NKA           Current Medications (10/24/2020):  This is the current hospital active medication list Current Facility-Administered Medications  Medication Dose Route Frequency Provider Last Rate Last Admin    stroke: mapping our early stages of recovery book   Does not apply Once Kirby-Graham, Karsten Fells, NP       acetaminophen (TYLENOL) tablet 650 mg  650 mg Oral Q4H PRN Gardiner Barefoot, NP       Or   acetaminophen (TYLENOL) 160 MG/5ML solution 650 mg  650 mg Per Tube Q4H PRN Kirby-Graham, Karsten Fells, NP       Or   acetaminophen (TYLENOL) suppository 650 mg  650 mg Rectal Q4H PRN Kirby-Graham, Karsten Fells, NP       aspirin EC tablet 81 mg  81 mg Oral Daily Rosalin Hawking, MD   81 mg at 10/24/20 1056   atorvastatin (LIPITOR) tablet 80 mg  80 mg Oral q1800 Rosalin Hawking, MD       carvedilol (COREG) tablet 6.25 mg  6.25 mg Oral BID  WC Rosalin Hawking, MD   3.125 mg at 10/24/20 1055   chlorhexidine (PERIDEX) 0.12 % solution 15 mL  15 mL Mouth Rinse BID Gwinda Maine, MD   15 mL at 10/24/20 0805   Chlorhexidine Gluconate Cloth 2 % PADS 6 each  6 each Topical Daily Gwinda Maine, MD       clevidipine (CLEVIPREX) infusion 0.5 mg/mL  0-21 mg/hr Intravenous Continuous Gardiner Barefoot, NP   Held at 10/23/20 6962   clopidogrel (PLAVIX) tablet 75 mg  75 mg Oral Daily Rosalin Hawking, MD   75 mg at 10/24/20 1055   enoxaparin (LOVENOX) injection 30 mg  30 mg Subcutaneous Q24H Reome, Earle J, RPH   30 mg at 10/24/20 1057   escitalopram (LEXAPRO) tablet 10 mg  10 mg Oral Daily Rosalin Hawking, MD   10 mg at 10/24/20 1056   famotidine (PEPCID) tablet 20 mg  20 mg Oral Daily  Rosalin Hawking, MD   20 mg at 10/24/20 1055   hydrALAZINE (APRESOLINE) injection 10 mg  10 mg Intravenous Q6H PRN Gwinda Maine, MD   10 mg at 10/24/20 0753   insulin aspart (novoLOG) injection 0-9 Units  0-9 Units Subcutaneous Q4H [name], Unk Pinto, MD       isosorbide-hydrALAZINE (BIDIL) 20-37.5 MG per tablet 1 tablet  1 tablet Oral BID Rosalin Hawking, MD   1 tablet at 10/24/20 1058   MEDLINE mouth rinse  15 mL Mouth Rinse q12n4p Gwinda Maine, MD       senna-docusate (Senokot-S) tablet 1 tablet  1 tablet Oral BID Gardiner Barefoot, NP   1 tablet at 10/24/20 1058     Discharge Medications: Please see discharge summary for a list of discharge medications.  Relevant Imaging Results:  Relevant Lab Results:   Additional Information SSN: [place]

## 2020-10-24 NOTE — Progress Notes (Signed)
STROKE TEAM PROGRESS NOTE   SUBJECTIVE (INTERVAL HISTORY) His RN is at the bedside.  Overall his condition is completely resolved.  Patient sitting in bed, awake alert, pleasant, orientated but with moderate to severe dysarthria.  Seems at baseline.  He cannot tell what happened yesterday, he stated that he just simply passed out.   OBJECTIVE Temp:  [97.6 F (36.4 C)-98.9 F (37.2 C)] 97.9 F (36.6 C) (10/17 0800) Pulse Rate:  [60-75] 71 (10/17 0800) Cardiac Rhythm: Normal sinus rhythm (10/17 0400) Resp:  [7-25] 12 (10/17 0800) BP: (136-188)/(60-95) 175/82 (10/17 0800) SpO2:  [94 %-100 %] 98 % (10/17 0800) Weight:  [67.2 kg] 67.2 kg (10/16 1600)  Recent Labs  Lab 10/23/20 1944 10/23/20 2315 10/24/20 0317 10/24/20 0820 10/24/20 1121  GLUCAP 104* 107* 103* 107* 154*   Recent Labs  Lab 10/23/20 0738 10/23/20 0739  NA 139 140  K 4.5 4.5  CL 110 107  CO2 24  --   GLUCOSE 191* 185*  BUN 27* 26*  CREATININE 3.10* 3.00*  CALCIUM 7.7*  --    Recent Labs  Lab 10/23/20 0738  AST 11*  ALT 10  ALKPHOS 64  BILITOT 0.7  PROT 4.9*  ALBUMIN 1.9*   Recent Labs  Lab 10/23/20 0738 10/23/20 0739  WBC 8.4  --   NEUTROABS 4.7  --   HGB 8.9* 8.5*  HCT 28.5* 25.0*  MCV 90.5  --   PLT 249  --    No results for input(s): CKTOTAL, CKMB, CKMBINDEX, TROPONINI in the last 168 hours. Recent Labs    10/23/20 0738  LABPROT 13.2  INR 1.0   No results for input(s): COLORURINE, LABSPEC, PHURINE, GLUCOSEU, HGBUR, BILIRUBINUR, KETONESUR, PROTEINUR, UROBILINOGEN, NITRITE, LEUKOCYTESUR in the last 72 hours.  Invalid input(s): APPERANCEUR     Component Value Date/Time   CHOL 176 10/24/2020 0443   TRIG 166 (H) 10/24/2020 0443   HDL 36 (L) 10/24/2020 0443   CHOLHDL 4.9 10/24/2020 0443   VLDL 33 10/24/2020 0443   LDLCALC 107 (H) 10/24/2020 0443   Lab Results  Component Value Date   HGBA1C 8.6 (H) 10/23/2020   No results found for: LABOPIA, COCAINSCRNUR, LABBENZ, AMPHETMU, THCU,  LABBARB  No results for input(s): ETH in the last 168 hours.  I have personally reviewed the radiological images below and agree with the radiology interpretations.  CT HEAD WO CONTRAST (5MM)  Result Date: 10/23/2020 CLINICAL DATA:  Stroke, follow up post TNK or stat with any change in neuro checks EXAM: CT HEAD WITHOUT CONTRAST TECHNIQUE: Contiguous axial images were obtained from the base of the skull through the vertex without intravenous contrast. COMPARISON:  CT from the same day FINDINGS: Brain: No evidence of acute large vascular territory infarction, hemorrhage, hydrocephalus, extra-axial collection or mass lesion/mass effect. Similar generalized atrophy with ex vacuo ventricular dilation. Similar remote perforator infarcts in bilateral basal ganglia and remote right superior cerebellar infarcts. Vascular: No hyperdense vessel identified. Calcific intracranial atherosclerosis. Skull: No acute fracture. Sinuses/Orbits: Ethmoid air cell mucosal thickening. No acute orbital findings. Other: No mastoid effusions. IMPRESSION: No acute findings.  Similar atrophy and chronic infarcts. Electronically Signed   By: Margaretha Sheffield M.D.   On: 10/23/2020 14:49   [name] CONTRAST  Result Date: 10/24/2020 CLINICAL DATA:  Neuro deficit, acute, stroke suspected. EXAM: MRI HEAD WITHOUT CONTRAST TECHNIQUE: Multiplanar, multiecho pulse sequences of the brain and surrounding structures were obtained without intravenous contrast. COMPARISON:  Prior head CT 10/23/2020. CT angiogram head/neck 10/23/2020.  Brain MRI 07/06/2014. FINDINGS: Brain: Intermittently motion degraded exam, limiting evaluation. Most notably, there is moderate motion degradation of the axial diffusion-weighted sequence, severe motion degradation of the axial T1 weighted sequence and moderate motion degradation of the coronal T2 TSE sequence. Mild-to-moderate generalized cerebral atrophy with associated prominence of the ventricles and sulci.  Redemonstrated chronic small-vessel infarcts within the bilateral basal ganglia, corpus callosum, bilateral thalami and within the pons. Moderate multifocal T2 FLAIR hyperintense signal abnormality within the cerebral white matter, nonspecific but compatible chronic small vessel ischemic disease. Mild chronic small vessel ischemic changes are also present within the pons. Redemonstrated chronic infarcts within the right cerebellar hemisphere. There is no acute infarct. No evidence of an intracranial mass. No extra-axial fluid collection. No midline shift. Vascular: Maintained flow voids within the proximal large arterial vessels. Skull and upper cervical spine: No focal suspicious marrow lesion. Sinuses/Orbits: Visualized orbits show no acute finding. Trace mucosal thickening within the bilateral ethmoid and right maxillary sinuses. Superimposed 2 cm right maxillary sinus mucous retention cyst. IMPRESSION: Motion degraded exam, as described. Redemonstrated chronic small-vessel infarcts within the bilateral basal ganglia, corpus callosum, bilateral thalami and within the pons. Background chronic small vessel ischemic changes which are moderate in the cerebral white matter, and mild in the pons. Redemonstrated chronic infarcts within the right cerebellar hemisphere. Mild-to-moderate generalized cerebral atrophy. Paranasal sinus disease, as outlined. Electronically Signed   By: Kellie Simmering D.O.   On: 10/24/2020 09:44   CT HEAD CODE STROKE WO CONTRAST  Result Date: 10/23/2020 CLINICAL DATA:  Code stroke. EXAM: CT HEAD WITHOUT CONTRAST TECHNIQUE: Contiguous axial images were obtained from the base of the skull through the vertex without intravenous contrast. COMPARISON:  07/11/2020 FINDINGS: Brain: No evidence of acute infarction, hemorrhage, hydrocephalus, extra-axial collection or mass lesion/mass effect. Generalized atrophy with ventriculomegaly. Remote perforator infarcts at the bilateral basal ganglia. Remote  right superior cerebellar infarct. Vascular: No hyperdense vessel or unexpected calcification. Skull: Normal. Negative for fracture or focal lesion. Sinuses/Orbits: No acute finding. Other: These results were communicated to Dr. Theda Sers at 7:47 am on 10/23/2020 by text page via the [facility] messaging system. ASPECTS [facility] Stroke Program Early CT Score) Not scored without localizing history. IMPRESSION: 1. No acute finding. 2. Atrophy with chronic perforator and right superior cerebellar infarcts. Electronically Signed   By: Jorje Guild M.D.   On: 10/23/2020 07:48   CT ANGIO HEAD CODE STROKE  Result Date: 10/23/2020 CLINICAL DATA:  Neuro deficit, code stroke EXAM: CT ANGIOGRAPHY HEAD AND NECK TECHNIQUE: Multidetector CT imaging of the head and neck was performed using the standard protocol during bolus administration of intravenous contrast. Multiplanar CT image reconstructions and MIPs were obtained to evaluate the vascular anatomy. Carotid stenosis measurements (when applicable) are obtained utilizing NASCET criteria, using the distal internal carotid diameter as the denominator. CONTRAST:  139mL OMNIPAQUE IOHEXOL 350 MG/ML SOLN COMPARISON:  Same-day noncontrast CT head, brain MRI 07/06/2014, CT cervical spine 06/03/2020 FINDINGS: CTA NECK FINDINGS Aortic arch: There is mild calcified atherosclerotic plaque of the aortic arch. There is mild narrowing of the left subclavian artery at the level of the origin of the internal mammary artery. Right carotid system: There is soft and calcified atherosclerotic plaque in the proximal left internal carotid artery resulting in up to 60-70% stenosis. The distal internal carotid artery is patent. There is no dissection or aneurysm. Left carotid system: There is scattered soft plaque throughout the common carotid artery without hemodynamically significant stenosis. There is soft and calcified atherosclerotic  plaque in the proximal left internal carotid artery without  hemodynamically significant stenosis. There is no dissection or aneurysm. Vertebral arteries: There is calcified atherosclerotic plaque at the origin of the right vertebral artery resulting in at least moderate stenosis. The remainder of the right vertebral artery is patent. The left vertebral artery is dominant. There is minimal calcified atherosclerotic plaque at the V2/V3 junction without significant stenosis. There is no dissection or aneurysm. Skeleton: There is multilevel degenerative change of the cervical spine, most advanced at C5-C6 through C7-T1. There is no acute osseous abnormality or aggressive osseous lesion. Other neck: The soft tissues are unremarkable. Upper chest: The lung apices are clear. Review of the MIP images confirms the above findings CTA HEAD FINDINGS Anterior circulation: There is calcified atherosclerotic plaque of the bilateral intracranial ICAs resulting in focal moderate to severe stenosis on the right (8-76), and mild stenosis on the left. There is mild irregularity of the right M1 and M2 branches due to atherosclerotic disease without focal high-grade stenosis or occlusion. There is focal stenosis at the origin of a superior left M2 division (11-16). There is mild irregularity of the distal left MCA branches without other significant stenosis The right A1 segment is hypoplastic, a normal variant. Otherwise, the bilateral ACAs are patent. There is no aneurysm. Posterior circulation: There is a PICA termination of the right vertebral artery, a normal variant. The left V4 segment is patent. The basilar artery is patent. There is a fetal PCA on the right with mild multifocal irregularity of the posterior communicating artery. There is mild multifocal irregularity of the left P1 segment. There is no high-grade stenosis or occlusion. Venous sinuses: As permitted by contrast timing, patent. Anatomic variants: As above. Review of the MIP images confirms the above findings IMPRESSION: 1. No  emergent large vessel occlusion in the head or neck. 2. Calcified atherosclerotic plaque in the bilateral carotid bulbs resulting in up to 60-70% stenosis on the right and no hemodynamically significant stenosis on the left. 3. Intracranial atherosclerotic disease as detailed above resulting in moderate to severe stenosis of the right intracranial ICA, focal stenosis at the origin of a superior left M2 division, and mild irregularity throughout the remainder of the intracranial vasculature. Electronically Signed   By: Valetta Mole M.D.   On: 10/23/2020 08:19   CT ANGIO NECK CODE STROKE  Result Date: 10/23/2020 CLINICAL DATA:  Neuro deficit, code stroke EXAM: CT ANGIOGRAPHY HEAD AND NECK TECHNIQUE: Multidetector CT imaging of the head and neck was performed using the standard protocol during bolus administration of intravenous contrast. Multiplanar CT image reconstructions and MIPs were obtained to evaluate the vascular anatomy. Carotid stenosis measurements (when applicable) are obtained utilizing NASCET criteria, using the distal internal carotid diameter as the denominator. CONTRAST:  162mL OMNIPAQUE IOHEXOL 350 MG/ML SOLN COMPARISON:  Same-day noncontrast CT head, brain MRI 07/06/2014, CT cervical spine 06/03/2020 FINDINGS: CTA NECK FINDINGS Aortic arch: There is mild calcified atherosclerotic plaque of the aortic arch. There is mild narrowing of the left subclavian artery at the level of the origin of the internal mammary artery. Right carotid system: There is soft and calcified atherosclerotic plaque in the proximal left internal carotid artery resulting in up to 60-70% stenosis. The distal internal carotid artery is patent. There is no dissection or aneurysm. Left carotid system: There is scattered soft plaque throughout the common carotid artery without hemodynamically significant stenosis. There is soft and calcified atherosclerotic plaque in the proximal left internal carotid artery without  hemodynamically  significant stenosis. There is no dissection or aneurysm. Vertebral arteries: There is calcified atherosclerotic plaque at the origin of the right vertebral artery resulting in at least moderate stenosis. The remainder of the right vertebral artery is patent. The left vertebral artery is dominant. There is minimal calcified atherosclerotic plaque at the V2/V3 junction without significant stenosis. There is no dissection or aneurysm. Skeleton: There is multilevel degenerative change of the cervical spine, most advanced at C5-C6 through C7-T1. There is no acute osseous abnormality or aggressive osseous lesion. Other neck: The soft tissues are unremarkable. Upper chest: The lung apices are clear. Review of the MIP images confirms the above findings CTA HEAD FINDINGS Anterior circulation: There is calcified atherosclerotic plaque of the bilateral intracranial ICAs resulting in focal moderate to severe stenosis on the right (8-76), and mild stenosis on the left. There is mild irregularity of the right M1 and M2 branches due to atherosclerotic disease without focal high-grade stenosis or occlusion. There is focal stenosis at the origin of a superior left M2 division (11-16). There is mild irregularity of the distal left MCA branches without other significant stenosis The right A1 segment is hypoplastic, a normal variant. Otherwise, the bilateral ACAs are patent. There is no aneurysm. Posterior circulation: There is a PICA termination of the right vertebral artery, a normal variant. The left V4 segment is patent. The basilar artery is patent. There is a fetal PCA on the right with mild multifocal irregularity of the posterior communicating artery. There is mild multifocal irregularity of the left P1 segment. There is no high-grade stenosis or occlusion. Venous sinuses: As permitted by contrast timing, patent. Anatomic variants: As above. Review of the MIP images confirms the above findings IMPRESSION: 1. No  emergent large vessel occlusion in the head or neck. 2. Calcified atherosclerotic plaque in the bilateral carotid bulbs resulting in up to 60-70% stenosis on the right and no hemodynamically significant stenosis on the left. 3. Intracranial atherosclerotic disease as detailed above resulting in moderate to severe stenosis of the right intracranial ICA, focal stenosis at the origin of a superior left M2 division, and mild irregularity throughout the remainder of the intracranial vasculature. Electronically Signed   By: Valetta Mole M.D.   On: 10/23/2020 08:19     PHYSICAL EXAM  Temp:  [97.6 F (36.4 C)-98.9 F (37.2 C)] 97.9 F (36.6 C) (10/17 0800) Pulse Rate:  [60-75] 71 (10/17 0800) Resp:  [7-25] 12 (10/17 0800) BP: (136-188)/(60-95) 175/82 (10/17 0800) SpO2:  [94 %-100 %] 98 % (10/17 0800) Weight:  [67.2 kg] 67.2 kg (10/16 1600)  General - Well nourished, well developed, in no apparent distress.  Ophthalmologic - fundi not visualized due to noncooperation.  Cardiovascular - Regular rhythm and rate.  Neuro - awake, alert, eyes open, orientated to age, place, time. No aphasia, however, severe dysarthria, difficult to understand, following all simple commands. Able to name and repeat. No gaze palsy, tracking bilaterally, visual field full, PERRL. No facial droop. Tongue midline but poor denture. Bilateral UEs 5/5, no drift. Bilaterally LEs proximal 4/5, however distally no movement of right ankle or foot, left ankle no movement, left toe DF/PF 2+/5. Sensation symmetrical bilaterally, b/l FTN intact grossly with mild action tremor, gait not tested.     ASSESSMENT/PLAN Mr. PAULINO CORK is a 75 y.o. male with history of dementia, diabetes, hypertension, hyperlipidemia, stroke, PVD admitted for slumped over in SNF, slurred speech, aphasia, generalized weakness.  TNK was given.  Likely encephalopathy versus syncope versus seizure,  less likely TIA, s/p TNK CT no acute abnormality, old  right superior cerebellum, right BG, left thalamus infarct. CTA head and neck right ICA bulb 60 to 70% stenosis, moderate to severe right terminal ICA and left M2. MRI no acute infarct 2D Echo pending EEG mild diffuse encephalopathy, no seizure LDL 107 HgbA1c 8.6 Lovenox for VTE prophylaxis aspirin 325 mg daily and clopidogrel 75 mg daily prior to admission, now on aspirin 81 and Plavix 75 DAPT for 3 weeks and then Plavix alone.  Ongoing aggressive stroke risk factor management Therapy recommendations: SNF Disposition: Pending  History of stroke 04/2014 admitted for slurred speech, right ataxia, dysphagia.  MRI showed right superior cerebellum, right frontal acute infarct, right BG/CR subacute infarct, as well as old bilateral thalami infarcts.  CTA head and neck intracranial stenosis with high-grade right A1, M2, PA, SCA, P1 and left P2 stenosis.  2D echo, LE venous Doppler and TEE negative.  Loop recorder placed.  LDL 172, discharged on DAPT for 3 months. 06/2014 admitted for punctate right periventricular infarct, loop recorder interrogation negative.  A1c 7.4.  Recommend TEE but not done.  Diabetes HgbA1c 8.6 goal < 7.0 Uncontrolled CBG monitoring SSI DM education and close PCP follow up  Hypertension Stable on the high end Resume home Coreg, BiDil Long term BP goal normotensive  Hyperlipidemia Home meds: Lipitor 40 LDL 107, goal < 70 Now on Lipitor 80 Continue statin at discharge  Other Stroke Risk Factors Advanced age PVD  Other Active Problems Dementia, SNF resident  Hospital day # 1  This patient is critically ill due to strokelike symptoms status post TNK and at significant risk of neurological worsening, death form recurrent stroke, hemorrhagic conversion, bleeding from TNK. This patient's care requires constant monitoring of vital signs, hemodynamics, respiratory and cardiac monitoring, review of multiple databases, neurological assessment, discussion with family,  other specialists and medical decision making of high complexity. I spent 35 minutes of neurocritical care time in the care of this patient.   Rosalin Hawking, MD PhD Stroke Neurology 10/24/2020 12:03 PM    To contact Stroke Continuity provider, please refer to [email or web address] After hours, contact General Neurology

## 2020-10-24 NOTE — Progress Notes (Signed)
Initial Nutrition Assessment  DOCUMENTATION CODES:  Non-severe (moderate) malnutrition in context of chronic illness  INTERVENTION:  Add Ensure Plus High Protein po TID, each supplement provides 350 kcal and 20 grams of protein.   Add Magic cup TID with meals, each supplement provides 290 kcal and 9 grams of protein.  Add MVI with minerals daily.  NUTRITION DIAGNOSIS:  Moderate Malnutrition related to chronic illness (dementia) as evidenced by moderate fat depletion, moderate muscle depletion.  GOAL:  Patient will meet greater than or equal to 90% of their needs  MONITOR:  PO intake, Supplement acceptance, Labs, Weight trends, Skin, I & O's  REASON FOR ASSESSMENT:  Malnutrition Screening Tool    ASSESSMENT:  75 yo male with a PMH of remote CVA 2016 with sequelae of dysarthria, non healing wounds on LEs, peripheral neuropathy, HTN, HLD, DM II, TIA 2012, and dementia who presents with a stroke.  Pt reports that his lunch did not taste good and vomited on the tray. RD observed yellow and broccoli on the tray, but could not tell if it was true emesis.  Per Epic, pt has lost ~5 lbs (3%) in the last 3 months, which is not necessarily significant for the time frame.  Pt open to eating Magic Cup and drinking Ensure.  Medications: reviewed; Pepcid, SSI, Senokot BID  Labs: reviewed; CBG 103-192 (H) HbA1c: 8.6% (10/23/2020)  NUTRITION - FOCUSED PHYSICAL EXAM: Flowsheet Row Most Recent Value  Orbital Region Moderate depletion  Upper Arm Region Moderate depletion  Thoracic and Lumbar Region Mild depletion  Buccal Region Moderate depletion  Temple Region Severe depletion  Clavicle Bone Region Moderate depletion  Clavicle and Acromion Bone Region Moderate depletion  Scapular Bone Region Moderate depletion  Dorsal Hand Moderate depletion  Patellar Region Severe depletion  Anterior Thigh Region Severe depletion  Posterior Calf Region Severe depletion  Edema (RD Assessment) None   Hair Reviewed  Eyes Reviewed  Mouth Reviewed  Skin Reviewed  Nails Reviewed   Diet Order:   Diet Order             Diet Carb Modified Fluid consistency: Thin; Room service appropriate? Yes  Diet effective now                  EDUCATION NEEDS:  Education needs have been addressed  Skin:  Skin Assessment: Skin Integrity Issues: Skin Integrity Issues:: Unstageable Unstageable: Pressure Injuries - bilateral ankles and big toes  Last BM:  10/24/20 - large  Height:  Ht Readings from Last 1 Encounters:  10/23/20 5\' 9"  (1.753 m)   Weight:  Wt Readings from Last 1 Encounters:  10/23/20 67.2 kg   BMI:  Body mass index is 21.88 kg/m.  Estimated Nutritional Needs:  Kcal:  1850-2050 Protein:  90-105 grams Fluid:  >1.85 L  Derrel Nip, RD, LDN (she/her/hers) Registered Dietitian I After-Hours/Weekend Pager # in [place]

## 2020-10-24 NOTE — Evaluation (Signed)
Physical Therapy Evaluation Patient Details Name: Hayden Mckinney MRN: [id number] DOB: [birth year] Today's Date: 10/24/2020  History of Present Illness  75 y.o. male who resides in a SNF and presents as a code stroke via EMS. +TNK. MRI (-) acute infarct; chronic infarcts B BG, CC B thalami and Pons. 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  Patienr reports he was able to transfer himself to the wheelchair and to the bathroom in the nursing facility.  Currently +2 max A for OOB to chair due to bilat knee contractures, R ankle stiffness and ataxia.  Patient will benefit from skilled PT in the acute setting and from follow up PT in the SNF setting.        Recommendations for follow up therapy are one component of a multi-disciplinary discharge planning process, led by the attending physician.  Recommendations may be updated based on patient status, additional functional criteria and insurance authorization.  Follow Up Recommendations SNF    Equipment Recommendations  None recommended by PT    Recommendations for Other Services       Precautions / Restrictions Precautions Precautions: Fall Precaution Comments: LE Wounds; B knee flexion contractures, R foot drop      Mobility  Bed Mobility Overal bed mobility: Needs Assistance Bed Mobility: Supine to Sit     Supine to sit: Min assist          Transfers Overall transfer level: Needs assistance Equipment used: 1 person hand held assist Transfers: Set designer Transfers;Sit to/from Stand Sit to Stand: Max assist;+2 physical assistance   Squat pivot transfers: Max assist;+2 physical assistance     General transfer comment: toward L to recliner also performed sit  to stand with difficulty due to knee flexion contractures and R ankle limited ROM  Ambulation/Gait             General Gait Details: unable  Stairs            Wheelchair Mobility    Modified Rankin (Stroke Patients Only) Modified Rankin (Stroke Patients Only) Pre-Morbid Rankin Score: Moderate disability (was transferring to w/c and propelling unaided per pt) Modified Rankin: Severe disability     Balance Overall balance assessment: Needs assistance   Sitting balance-Leahy Scale: Fair     Standing balance support: Bilateral upper extremity supported Standing balance-Leahy Scale: Zero Standing balance comment: A due to knee flexion contractures and LE general weakness                             Pertinent Vitals/Pain Pain Assessment: Faces Faces Pain Scale: Hurts little more Pain Location: with B knee ROM Pain Descriptors / Indicators: Discomfort;Grimacing Pain Intervention(s): Limited activity within patient's tolerance    Home Living Family/patient expects to be discharged to:: Skilled nursing facility   Available Help at Discharge: [place] Type of Home: [place]                Prior Function Level of Independence: Needs assistance   Gait / Transfers Assistance Needed: per pt he transfers to wc by squat pivot, however unsure of reliability  ADL's / Homemaking Assistance Needed: staff assists with ADL tasks  Hand Dominance   Dominant Hand: Right (however priomarily uses L)    Extremity/Trunk Assessment   Upper Extremity Assessment Upper Extremity Assessment: Defer to OT evaluation    Lower Extremity Assessment Lower Extremity Assessment: RLE deficits/detail;LLE deficits/detail RLE Deficits / Details: AROM limited knee extension to about 30 degrees from neutral, ankle AROM extremely limited only about 20 degrees total AAROM; strength hip flexion 4-/5, knee extension 3+/5. ankle DF 1/5 RLE  Coordination: decreased gross motor LLE Deficits / Details: AROM limited knee extension to about 25 degrees from neutral, ankle AROM limited about 50% overall, strength hip flexion 4-/5, knee extension 4-/5, ankle DF 3+/5 LLE Coordination: decreased gross motor    Cervical / Trunk Assessment Cervical / Trunk Assessment: Kyphotic (truncal ataxia noted)  Communication   Communication: Expressive difficulties  Cognition Arousal/Alertness: Awake/alert Behavior During Therapy: WFL for tasks assessed/performed;Impulsive Overall Cognitive Status: No family/caregiver present to determine baseline cognitive functioning Area of Impairment: Orientation;Attention;Memory;Safety/judgement;Awareness;Problem solving                 Orientation Level: Disoriented to;Situation Current Attention Level: Sustained Memory: Decreased short-term memory   Safety/Judgement: Decreased awareness of deficits;Decreased awareness of safety Awareness: Emergent Problem Solving: Slow processing General Comments: most likely close to baseline      General Comments General comments (skin integrity, edema, etc.): R shin with dressing and area of errythema around it, old area noted for scar similar to skin grafting    Exercises     Assessment/Plan    PT Assessment Patient needs continued PT services  PT Problem List Decreased strength;Decreased mobility;Decreased safety awareness;Decreased range of motion;Decreased coordination;Decreased balance;Decreased knowledge of use of DME       PT Treatment Interventions DME instruction;Therapeutic activities;Cognitive remediation;Patient/family education;Therapeutic exercise;Balance training;Wheelchair mobility training;Functional mobility training    PT Goals (Current goals can be found in the Care Plan section)  Acute Rehab PT Goals Patient Stated Goal: to get some rehab PT Goal Formulation: With patient Time For Goal Achievement: 11/07/20 Potential to Achieve  Goals: Fair    Frequency Min 3X/week   Barriers to discharge        Co-evaluation PT/OT/SLP Co-Evaluation/Treatment: Yes Reason for Co-Treatment: Complexity of the patient's impairments (multi-system involvement);For patient/therapist safety;To address functional/ADL transfers PT goals addressed during session: Mobility/safety with mobility;Balance OT goals addressed during session: ADL's and self-care       AM-PAC PT "6 Clicks" Mobility  Outcome Measure Help needed turning from your back to your side while in a flat bed without using bedrails?: A Little Help needed moving from lying on your back to sitting on the side of a flat bed without using bedrails?: A Little Help needed moving to and from a bed to a chair (including a wheelchair)?: A Lot Help needed standing up from a chair using your arms (e.g., wheelchair or bedside chair)?: A Lot Help needed to walk in hospital room?: A Lot Help needed climbing 3-5 steps with a railing? : Total 6 Click Score: 13    End of Session Equipment Utilized During Treatment: Gait belt Activity Tolerance: Patient tolerated treatment well Patient left: in chair;with call bell/phone within reach;with chair alarm set Nurse Communication: Mobility status PT Visit Diagnosis: Other abnormalities of gait and mobility (R26.89);Other symptoms and signs involving the nervous system (B63.845)    Time: 3646-8032 PT Time Calculation (min) (ACUTE ONLY): 26 min   Charges:   PT Evaluation $PT Eval Moderate Complexity: 1 Mod          Cyndi Aarvi Stotts,  PT Acute Rehabilitation Services TMYTR:[phone] [phone] 10/24/2020   Reginia Naas 10/24/2020, 4:21 PM

## 2020-10-24 NOTE — Progress Notes (Signed)
EEG completed, results pending. 

## 2020-10-24 NOTE — Consult Note (Signed)
WOC Nurse Consult Note: Patient receiving care in [facility] 4N20 Reason for Consult: BLE wounds Wound type: Right shin wound measure 4.5 x 3.2 and is 25% yellow fibrinous tissue and 75% pink/red/brown, periwound is pink surrounding this wound. Lateral right ankle measures 3 x 2 x 0.4 100% yellow fibrinous tissue with a large amount of brown drainage. Lateral left ankle measures 2.4 x 2.3 0.4 with 100% large amount of slough. Left foot just below the great toe measures 2 x 1.5 and is pink/yellow with a small amount of brown drainage. Right foot just below the great toe measures 3 x 2 x 0.8 and is brown with 75% yellow slough Pressure Injury POA: NA Dressing procedure/placement/frequency: Place a cut to fit piece a Aquacel Advantage over each of these wounds and secure with foam dressing. Change twice daily. May use NS to loosen the Aquacel if sticks to the wound.   Monitor the wound area(s) for worsening of condition such as: Signs/symptoms of infection, increase in size, development of or worsening of odor, development of pain, or increased pain at the affected locations.   Notify the medical team if any of these develop.  Pressure Injury Prevention Bundle May use any that apply to this patient. Support surfaces (air mattress) chair cushion Kellie Simmering # [phone]) Heel offloading boots Kellie Simmering # [phone]) Turning and Positioning  Measures to reduce shear (draw sheet, knees up) Skin protection Products (Foam dressing) Moisture management products (Critic-Aid Barrier Cream (Purple top) Sween moisturizing lotion (Pink top in clean supply) Nutrition Management Protection for Medical Devices Routine Skin Assessment   Thank you for the consult. [place] nurse will not follow at this time.   Please re-consult the [place] team if needed.  Cathlean Marseilles Tamala Julian, MSN, RN, [place], Lysle Pearl, [facility] Wound Treatment Associate Pager [phone]

## 2020-10-24 NOTE — Evaluation (Signed)
Clinical/Bedside Swallow Evaluation Patient Details  Name: Hayden Mckinney MRN: [id number] Date of Birth: [birth year]  Today's Date: 10/24/2020 Time: SLP Start Time (ACUTE ONLY): 1024 SLP Stop Time (ACUTE ONLY): 1037 SLP Time Calculation (min) (ACUTE ONLY): 13 min  Past Medical History:  Past Medical History:  Diagnosis Date   CVA (cerebral vascular accident) ([place])    Dementia ([place])    Diabetic peripheral neuropathy ([place])    a. both feet   ETOH abuse    a. 04/2014 18-24 beers q weekend.   H/O echocardiogram    a. 04/2014 Echo: EF 55-60%, no rwma.   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 04/2014 multiple bateral infarcts, presumed to be embolic;  b. 08/6765 Carotid U/S: 1-39% bilat ICA stenosis.   TIA (transient ischemic attack)    a. 08/2010.   Type II diabetes mellitus ([place])    Past Surgical History:  Past Surgical History:  Procedure Laterality Date   LOOP RECORDER IMPLANT N/A 04/20/2014   Procedure: LOOP RECORDER IMPLANT;  Surgeon: Thompson Grayer, MD;  Location: [facility] CATH LAB;  Service: Cardiovascular;  Laterality: N/A;   repair of R hand after trauma     sarcoma removal     TEE WITHOUT CARDIOVERSION N/A 04/20/2014   Procedure: TRANSESOPHAGEAL ECHOCARDIOGRAM (TEE);  Surgeon: Larey Dresser, MD;  Location: [facility] ENDOSCOPY;  Service: Cardiovascular;  Laterality: N/A;   HPI:  75 y.o. M with PMHx significant for remote CVA 2016 w/ dysarthria, non-healing wounds on LEs, peripheral neuropathy, HTN, HLD, DM II, TIA 2012, and dementia presenting to [facility] after found slumped over in wheelchair by SNF staff. Imaging revealed atrophy and progressive white matter microvascular ischemic changes throughout both cerebral hemispheres, remote bilateral basal ganglia and right superior cerebellar infarcts.    Assessment / Plan / Recommendation  Clinical Impression  Pt was seen for a bedside swallow evaluation. SLP assisted pt in completing oral care. Pt  edentulous and reports eating regular consistencies at baseline despite having no dentures. Oral mechanism exam revealed mild facial and lingual weakness but was otherwise unremarkable. SLP trialed small and large/consecutive sips of thin liquids, puree, and regular solids. Pt exhibited no oral phase impairments and no overt s/s of penetration or aspiration. Recommend regular/thin liquid diet, administering medications whole with thin liquid. Pt requires no follow up from SLP for swallowing needs. SLP Visit Diagnosis: Dysphagia, unspecified (R13.10)    Aspiration Risk  No limitations    Diet Recommendation Regular;Thin liquid   Liquid Administration via: Straw Medication Administration: Whole meds with liquid Supervision: Patient able to self feed Compensations: Minimize environmental distractions Postural Changes: Seated upright at 90 degrees    Other  Recommendations Oral Care Recommendations: Oral care BID    Recommendations for follow up therapy are one component of a multi-disciplinary discharge planning process, led by the attending physician.  Recommendations may be updated based on patient status, additional functional criteria and insurance authorization.  Follow up Recommendations Skilled Nursing facility      Frequency and Duration min 2x/week          Prognosis        Swallow Study   General Date of Onset: 10/23/20 HPI: 75 y.o. M with PMHx significant for remote CVA 2016 w/ dysarthria, non-healing wounds on LEs, peripheral neuropathy, HTN, HLD, DM II, TIA 2012, and dementia presenting to [facility] after found slumped over in wheelchair by SNF staff.  Imaging revealed atrophy and progressive white matter microvascular ischemic changes throughout both cerebral hemispheres, remote bilateral basal ganglia and right superior cerebellar infarcts. Type of Study: Bedside Swallow Evaluation Previous Swallow Assessment: 2016 Diet Prior to this Study: NPO Temperature Spikes Noted:  No Respiratory Status: Room air History of Recent Intubation: No Behavior/Cognition: Alert;Cooperative;Pleasant mood Oral Cavity Assessment: Within Functional Limits Oral Care Completed by SLP: Yes Oral Cavity - Dentition: Edentulous;Other (Comment) (does not have dentures) Vision: Functional for self-feeding Self-Feeding Abilities: Needs set up Patient Positioning: Upright in bed Baseline Vocal Quality: Normal Volitional Cough: Strong Volitional Swallow: Able to elicit    Oral/Motor/Sensory Function Overall Oral Motor/Sensory Function: Moderate impairment Facial ROM: Within Functional Limits Facial Symmetry: Within Functional Limits Facial Strength: Reduced right;Reduced left;Suspected CN VII (facial) dysfunction Facial Sensation: Within Functional Limits Lingual ROM: Within Functional Limits Lingual Symmetry: Within Functional Limits Lingual Strength: Reduced;Suspected CN XII (hypoglossal) dysfunction Velum: Within Functional Limits Mandible: Within Functional Limits   Ice Chips Ice chips: Not tested   Thin Liquid Thin Liquid: Within functional limits Presentation: Straw    Nectar Thick Nectar Thick Liquid: Not tested   Honey Thick Honey Thick Liquid: Not tested   Puree Puree: Within functional limits Presentation: Spoon;Self Fed   Solid    Dewitt Rota, SLP-Student  Solid: Within functional limits Presentation: Self Fed       Clancy Leiner 10/24/2020,12:46 PM

## 2020-10-25 ENCOUNTER — Inpatient Hospital Stay (HOSPITAL_COMMUNITY): Payer: Medicare Other

## 2020-10-25 DIAGNOSIS — I10 Essential (primary) hypertension: Secondary | ICD-10-CM

## 2020-10-25 DIAGNOSIS — G9340 Encephalopathy, unspecified: Secondary | ICD-10-CM

## 2020-10-25 DIAGNOSIS — E43 Unspecified severe protein-calorie malnutrition: Secondary | ICD-10-CM | POA: Insufficient documentation

## 2020-10-25 DIAGNOSIS — E440 Moderate protein-calorie malnutrition: Secondary | ICD-10-CM

## 2020-10-25 DIAGNOSIS — E1149 Type 2 diabetes mellitus with other diabetic neurological complication: Secondary | ICD-10-CM

## 2020-10-25 LAB — GLUCOSE, CAPILLARY
Glucose-Capillary: 109 mg/dL — ABNORMAL HIGH (ref 70–99)
Glucose-Capillary: 101 mg/dL — ABNORMAL HIGH (ref 70–99)
Glucose-Capillary: 94 mg/dL (ref 70–99)
Glucose-Capillary: 229 mg/dL — ABNORMAL HIGH (ref 70–99)
Glucose-Capillary: 163 mg/dL — ABNORMAL HIGH (ref 70–99)

## 2020-10-25 MED ORDER — ACETAMINOPHEN 325 MG PO TABS: 650.0000 mg | ORAL_TABLET | Freq: Four times a day (QID) | ORAL | Status: DC | PRN

## 2020-10-25 MED ORDER — VITAMIN D (ERGOCALCIFEROL) 1.25 MG (50000 UNIT) PO CAPS: 50000.0000 [IU] | ORAL_CAPSULE | ORAL | 0 refills | Status: AC

## 2020-10-25 MED ORDER — FAMOTIDINE 20 MG PO TABS: 20.0000 mg | ORAL_TABLET | Freq: Every day | ORAL | 0 refills | Status: AC

## 2020-10-25 MED ORDER — ESCITALOPRAM OXALATE 10 MG PO TABS: 10.0000 mg | ORAL_TABLET | Freq: Every day | ORAL | 0 refills | Status: AC

## 2020-10-25 MED ORDER — ASPIRIN 81 MG PO TBEC: 81.0000 mg | DELAYED_RELEASE_TABLET | Freq: Every day | ORAL | 0 refills | Status: DC

## 2020-10-25 MED ORDER — BISACODYL 10 MG RE SUPP: 10.0000 mg | Freq: Every day | RECTAL | 0 refills | Status: DC | PRN

## 2020-10-25 MED ORDER — ISOSORB DINITRATE-HYDRALAZINE 20-37.5 MG PO TABS: 1.0000 | ORAL_TABLET | Freq: Two times a day (BID) | ORAL | 0 refills | Status: AC

## 2020-10-25 MED ORDER — ENSURE ENLIVE PO LIQD: 237.0000 mL | Freq: Three times a day (TID) | ORAL | 12 refills | Status: DC

## 2020-10-25 MED ORDER — ADULT MULTIVITAMIN W/MINERALS CH: 1.0000 | ORAL_TABLET | Freq: Every day | ORAL | 0 refills | Status: AC

## 2020-10-25 MED ORDER — ESCITALOPRAM OXALATE 10 MG PO TABS: 10.0000 mg | ORAL_TABLET | Freq: Every day | ORAL | 0 refills | Status: DC

## 2020-10-25 MED ORDER — ACETAMINOPHEN 325 MG PO TABS: 650.0000 mg | ORAL_TABLET | Freq: Four times a day (QID) | ORAL | 0 refills | Status: AC | PRN

## 2020-10-25 MED ORDER — TAMSULOSIN HCL 0.4 MG PO CAPS: 0.4000 mg | ORAL_CAPSULE | Freq: Every day | ORAL | 0 refills | Status: DC

## 2020-10-25 MED ORDER — DOCUSATE SODIUM 100 MG PO CAPS: 100.0000 mg | ORAL_CAPSULE | Freq: Two times a day (BID) | ORAL | 0 refills | Status: DC

## 2020-10-25 MED ORDER — VITAMIN D (ERGOCALCIFEROL) 1.25 MG (50000 UNIT) PO CAPS: 50000.0000 [IU] | ORAL_CAPSULE | ORAL | 0 refills | Status: DC

## 2020-10-25 MED ORDER — ATORVASTATIN CALCIUM 80 MG PO TABS: 80.0000 mg | ORAL_TABLET | Freq: Every day | ORAL | 0 refills | Status: DC

## 2020-10-25 MED ORDER — NOVOLOG FLEXPEN 100 UNIT/ML ~~LOC~~ SOPN: 2.0000 [IU] | PEN_INJECTOR | SUBCUTANEOUS | 0 refills | Status: AC

## 2020-10-25 MED ORDER — VITAMIN C 500 MG PO TABS: 500.0000 mg | ORAL_TABLET | Freq: Two times a day (BID) | ORAL | 0 refills | Status: DC

## 2020-10-25 MED ORDER — FAMOTIDINE 20 MG PO TABS: 20.0000 mg | ORAL_TABLET | Freq: Every day | ORAL | 0 refills | Status: DC

## 2020-10-25 MED ORDER — GLIMEPIRIDE 1 MG PO TABS: 1.0000 mg | ORAL_TABLET | Freq: Every day | ORAL | 0 refills | Status: AC

## 2020-10-25 MED ORDER — CLOPIDOGREL BISULFATE 75 MG PO TABS: 75.0000 mg | ORAL_TABLET | Freq: Every day | ORAL | 0 refills | Status: DC

## 2020-10-25 MED ORDER — IRON HIGH-POTENCY 142 (45 FE) MG PO TBCR: 1.0000 | EXTENDED_RELEASE_TABLET | Freq: Every day | ORAL | 0 refills | Status: DC

## 2020-10-25 MED ORDER — DOCUSATE SODIUM 100 MG PO CAPS: 100.0000 mg | ORAL_CAPSULE | Freq: Two times a day (BID) | ORAL | 0 refills | Status: AC

## 2020-10-25 MED ORDER — IRON HIGH-POTENCY 142 (45 FE) MG PO TBCR: 1.0000 | EXTENDED_RELEASE_TABLET | Freq: Every day | ORAL | 0 refills | Status: AC

## 2020-10-25 MED ORDER — ATORVASTATIN CALCIUM 80 MG PO TABS: 80.0000 mg | ORAL_TABLET | Freq: Every day | ORAL | 0 refills | Status: AC

## 2020-10-25 MED ORDER — ISOSORB DINITRATE-HYDRALAZINE 20-37.5 MG PO TABS: 1.0000 | ORAL_TABLET | Freq: Two times a day (BID) | ORAL | 0 refills | Status: DC

## 2020-10-25 MED ORDER — CARVEDILOL 6.25 MG PO TABS: 6.2500 mg | ORAL_TABLET | Freq: Two times a day (BID) | ORAL | 0 refills | Status: DC

## 2020-10-25 MED ORDER — GLIMEPIRIDE 1 MG PO TABS: 1.0000 mg | ORAL_TABLET | Freq: Every day | ORAL | 0 refills | Status: DC

## 2020-10-25 MED ORDER — CARVEDILOL 6.25 MG PO TABS: 6.2500 mg | ORAL_TABLET | Freq: Two times a day (BID) | ORAL | 0 refills | Status: AC

## 2020-10-25 MED ORDER — BISACODYL 10 MG RE SUPP: 10.0000 mg | Freq: Every day | RECTAL | 0 refills | Status: AC | PRN

## 2020-10-25 MED ORDER — LOPERAMIDE HCL 2 MG PO CAPS: 2.0000 mg | ORAL_CAPSULE | ORAL | 0 refills | Status: AC | PRN

## 2020-10-25 MED ORDER — VITAMIN C 500 MG PO TABS: 500.0000 mg | ORAL_TABLET | Freq: Two times a day (BID) | ORAL | 0 refills | Status: AC

## 2020-10-25 NOTE — TOC Transition Note (Signed)
Transition of Care [facility]) - CM/SW Discharge Note   Patient Details  Name: Hayden Mckinney MRN: [id number] Date of Birth: 1945/02/05  Transition of Care [facility]) CM/SW Contact:  Geralynn Ochs, LCSW Phone Number: 10/25/2020, 1:23 PM   Clinical Narrative:   Nurse to call report to [phone], Room 402A.  Transport scheduled for 4:00 PM.    Final next level of care: [place] Barriers to Discharge: Barriers Resolved   Patient Goals and CMS Choice Patient states their goals for this hospitalization and ongoing recovery are:: Return to ltc CMS Medicare.gov Compare Post Acute Care list provided to:: Patient Choice offered to / list presented to : Patient  Discharge Placement              Patient chooses bed at: [facility] Patient to be transferred to facility by: [place] Name of family member notified: Self Patient and family notified of of transfer: 10/25/20  Discharge Plan and Services In-house Referral: Clinical Social Work   Post Acute Care Choice: [place]                               Social Determinants of Health ([place]) Interventions     Readmission Risk Interventions No flowsheet data found.

## 2020-10-25 NOTE — Discharge Summary (Addendum)
Stroke Discharge Summary  Patient ID: Hayden Mckinney   MRN: [id number]      DOB: [birth year]  Date of Admission: 10/23/2020 Date of Discharge: 10/25/2020  Attending Physician:  Stroke, Md, MD, Stroke MD Consultant(s):     Patient's PCP:  Wenda Low, MD  DISCHARGE DIAGNOSIS: Likely encephalopathy versus syncope versus seizure, less likely TIA, s/p TNK Principal Problem:   Encephalopathy Active Problems:   Type II diabetes mellitus with neurological manifestations ([place])   HTN (hypertension)   Hyperlipidemia   Allergies as of 10/25/2020   No Known Allergies      Medication List     STOP taking these medications    aspirin 325 MG tablet Replaced by: aspirin 81 MG EC tablet   doxycycline 100 MG tablet Commonly known as: VIBRA-TABS   hydrOXYzine 25 MG capsule Commonly known as: VISTARIL   hydrOXYzine 25 MG tablet Commonly known as: ATARAX/VISTARIL   pregabalin 25 MG capsule Commonly known as: LYRICA   pregabalin 50 MG capsule Commonly known as: LYRICA   ReliOn Confirm Glucose Monitor w/Device Kit   sulfamethoxazole-trimethoprim 800-160 MG tablet Commonly known as: BACTRIM DS       TAKE these medications    acetaminophen 325 MG tablet Commonly known as: TYLENOL Take 2 tablets (650 mg total) by mouth every 6 (six) hours as needed for mild pain (or Fever >/= 101).   aspirin 81 MG EC tablet Take 1 tablet (81 mg total) by mouth daily. Swallow whole. Start taking on: October 26, 2020 Replaces: aspirin 325 MG tablet   atorvastatin 80 MG tablet Commonly known as: LIPITOR Take 1 tablet (80 mg total) by mouth daily at 6 PM. What changed:  medication strength how much to take   bisacodyl 10 MG suppository Commonly known as: DULCOLAX Place 1 suppository (10 mg total) rectally daily as needed for moderate constipation.   carvedilol 6.25 MG tablet Commonly known as: COREG Take 1 tablet (6.25 mg total) by mouth 2 (two) times daily with a meal.    clopidogrel 75 MG tablet Commonly known as: PLAVIX Take 1 tablet (75 mg total) by mouth daily. Start taking on: October 26, 2020   docusate sodium 100 MG capsule Commonly known as: COLACE Take 1 capsule (100 mg total) by mouth 2 (two) times daily.   escitalopram 10 MG tablet Commonly known as: LEXAPRO Take 1 tablet (10 mg total) by mouth daily.   famotidine 20 MG tablet Commonly known as: PEPCID Take 1 tablet (20 mg total) by mouth daily.   feeding supplement Liqd Take 237 mLs by mouth 3 (three) times daily between meals.   glimepiride 1 MG tablet Commonly known as: AMARYL Take 1 tablet (1 mg total) by mouth daily with breakfast.   Iron High-Potency 142 (45 Fe) MG Tbcr Take 1 tablet by mouth daily.   isosorbide-hydrALAZINE 20-37.5 MG tablet Commonly known as: BIDIL Take 1 tablet by mouth 2 (two) times daily.   loperamide 2 MG capsule Commonly known as: IMODIUM Take 1 capsule (2 mg total) by mouth as needed for diarrhea or loose stools.   multivitamin with minerals Tabs tablet Take 1 tablet by mouth daily. Start taking on: October 26, 2020   NovoLOG FlexPen 100 UNIT/ML FlexPen Generic drug: insulin aspart Inject 2-11 Units into the skin See admin instructions. If blood glucose is 101-150, take 2 units  151-200, take 3 units  201-250, take 5 units  251-300, take 7 units  301-350, take 9 units  350, take 11 units  Call MD if blood glucose is over 400 or under 60   ondansetron 4 MG tablet Commonly known as: ZOFRAN Take 4 mg by mouth every 8 (eight) hours as needed for nausea or vomiting.   tamsulosin 0.4 MG Caps capsule Commonly known as: FLOMAX Take 1 capsule (0.4 mg total) by mouth daily.   vitamin C 500 MG tablet Commonly known as: ASCORBIC ACID Take 1 tablet (500 mg total) by mouth 2 (two) times daily.   Vitamin D (Ergocalciferol) 1.25 MG (50000 UNIT) Caps capsule Commonly known as: DRISDOL Take 1 capsule (50,000 Units total) by mouth every Tuesday.                Discharge Care Instructions  (From admission, onward)           Start     Ordered   10/25/20 0000  Change dressing (specify)       Comments: Dressing change: 2 times per day using Aquacel Advantage over each wound and secure with a foam dressing   10/25/20 1237            LABORATORY STUDIES CBC    Component Value Date/Time   WBC 8.4 10/23/2020 0738   RBC 3.15 (L) 10/23/2020 0738   HGB 8.5 (L) 10/23/2020 0739   HCT 25.0 (L) 10/23/2020 0739   PLT 249 10/23/2020 0738   MCV 90.5 10/23/2020 0738   MCH 28.3 10/23/2020 0738   MCHC 31.2 10/23/2020 0738   RDW 12.5 10/23/2020 0738   LYMPHSABS 2.9 10/23/2020 0738   MONOABS 0.7 10/23/2020 0738   EOSABS 0.1 10/23/2020 0738   BASOSABS 0.1 10/23/2020 0738   CMP    Component Value Date/Time   NA 140 10/23/2020 0739   K 4.5 10/23/2020 0739   CL 107 10/23/2020 0739   CO2 24 10/23/2020 0738   GLUCOSE 185 (H) 10/23/2020 0739   BUN 26 (H) 10/23/2020 0739   CREATININE 3.00 (H) 10/23/2020 0739   CREATININE 0.97 12/15/2010 1343   CALCIUM 7.7 (L) 10/23/2020 0738   PROT 4.9 (L) 10/23/2020 0738   ALBUMIN 1.9 (L) 10/23/2020 0738   AST 11 (L) 10/23/2020 0738   ALT 10 10/23/2020 0738   ALKPHOS 64 10/23/2020 0738   BILITOT 0.7 10/23/2020 0738   GFRNONAA 20 (L) 10/23/2020 0738   GFRNONAA 81 12/15/2010 1343   GFRAA 43 (L) 06/03/2015 2158   GFRAA >89 12/15/2010 1343   COAGS Lab Results  Component Value Date   INR 1.0 10/23/2020   INR 1.09 07/06/2014   INR 0.98 08/07/2010   Lipid Panel    Component Value Date/Time   CHOL 176 10/24/2020 0443   TRIG 166 (H) 10/24/2020 0443   HDL 36 (L) 10/24/2020 0443   CHOLHDL 4.9 10/24/2020 0443   VLDL 33 10/24/2020 0443   LDLCALC 107 (H) 10/24/2020 0443   HgbA1C  Lab Results  Component Value Date   HGBA1C 8.6 (H) 10/23/2020   Urinalysis    Component Value Date/Time   COLORURINE YELLOW 07/11/2020 1418   APPEARANCEUR CLEAR 07/11/2020 1418   LABSPEC 1.014  07/11/2020 1418   PHURINE 5.0 07/11/2020 1418   GLUCOSEU 50 (A) 07/11/2020 1418   HGBUR NEGATIVE 07/11/2020 1418   BILIRUBINUR NEGATIVE 07/11/2020 1418   KETONESUR NEGATIVE 07/11/2020 1418   PROTEINUR >=300 (A) 07/11/2020 1418   UROBILINOGEN 0.2 07/08/2014 0544   NITRITE NEGATIVE 07/11/2020 1418   LEUKOCYTESUR NEGATIVE 07/11/2020 1418   Urine Drug Screen No results found for:  LABOPIA, COCAINSCRNUR, LABBENZ, AMPHETMU, THCU, LABBARB  Alcohol Level    Component Value Date/Time   ETH <5 06/03/2015 2158     SIGNIFICANT DIAGNOSTIC STUDIES CT HEAD WO CONTRAST (5MM)  Result Date: 10/23/2020 CLINICAL DATA:  Stroke, follow up post TNK or stat with any change in neuro checks EXAM: CT HEAD WITHOUT CONTRAST TECHNIQUE: Contiguous axial images were obtained from the base of the skull through the vertex without intravenous contrast. COMPARISON:  CT from the same day FINDINGS: Brain: No evidence of acute large vascular territory infarction, hemorrhage, hydrocephalus, extra-axial collection or mass lesion/mass effect. Similar generalized atrophy with ex vacuo ventricular dilation. Similar remote perforator infarcts in bilateral basal ganglia and remote right superior cerebellar infarcts. Vascular: No hyperdense vessel identified. Calcific intracranial atherosclerosis. Skull: No acute fracture. Sinuses/Orbits: Ethmoid air cell mucosal thickening. No acute orbital findings. Other: No mastoid effusions. IMPRESSION: No acute findings.  Similar atrophy and chronic infarcts. Electronically Signed   By: Margaretha Sheffield M.D.   On: 10/23/2020 14:49   [name] CONTRAST  Result Date: 10/24/2020 CLINICAL DATA:  Neuro deficit, acute, stroke suspected. EXAM: MRI HEAD WITHOUT CONTRAST TECHNIQUE: Multiplanar, multiecho pulse sequences of the brain and surrounding structures were obtained without intravenous contrast. COMPARISON:  Prior head CT 10/23/2020. CT angiogram head/neck 10/23/2020. Brain MRI 07/06/2014.  FINDINGS: Brain: Intermittently motion degraded exam, limiting evaluation. Most notably, there is moderate motion degradation of the axial diffusion-weighted sequence, severe motion degradation of the axial T1 weighted sequence and moderate motion degradation of the coronal T2 TSE sequence. Mild-to-moderate generalized cerebral atrophy with associated prominence of the ventricles and sulci. Redemonstrated chronic small-vessel infarcts within the bilateral basal ganglia, corpus callosum, bilateral thalami and within the pons. Moderate multifocal T2 FLAIR hyperintense signal abnormality within the cerebral white matter, nonspecific but compatible chronic small vessel ischemic disease. Mild chronic small vessel ischemic changes are also present within the pons. Redemonstrated chronic infarcts within the right cerebellar hemisphere. There is no acute infarct. No evidence of an intracranial mass. No extra-axial fluid collection. No midline shift. Vascular: Maintained flow voids within the proximal large arterial vessels. Skull and upper cervical spine: No focal suspicious marrow lesion. Sinuses/Orbits: Visualized orbits show no acute finding. Trace mucosal thickening within the bilateral ethmoid and right maxillary sinuses. Superimposed 2 cm right maxillary sinus mucous retention cyst. IMPRESSION: Motion degraded exam, as described. Redemonstrated chronic small-vessel infarcts within the bilateral basal ganglia, corpus callosum, bilateral thalami and within the pons. Background chronic small vessel ischemic changes which are moderate in the cerebral white matter, and mild in the pons. Redemonstrated chronic infarcts within the right cerebellar hemisphere. Mild-to-moderate generalized cerebral atrophy. Paranasal sinus disease, as outlined. Electronically Signed   By: Kellie Simmering D.O.   On: 10/24/2020 09:44   EEG adult  Result Date: 10/24/2020 Lora Havens, MD     10/24/2020  5:29 PM Patient Name: Hayden Mckinney MRN: [id number] Epilepsy Attending: Lora Havens Referring Physician/Provider: Dr Rosalin Hawking Date: 10/24/2020 Duration: 23.45 mins Patient history: 75 year old male with altered mental status and speech disturbance.  EEG to evaluate for seizure. Level of alertness: Awake AEDs during EEG study: None Technical aspects: This EEG study was done with scalp electrodes positioned according to the 10-20 International system of electrode placement. Electrical activity was acquired at a sampling rate of '500Hz'  and reviewed with a high frequency filter of '70Hz'  and a low frequency filter of '1Hz' . EEG data were recorded continuously and digitally stored. Description: The posterior dominant rhythm  consists of 7.'5Hz'  activity of moderate voltage (25-35 uV) seen predominantly in posterior head regions, symmetric and reactive to eye opening and eye closing. EEG showed intermittent generalized 5 to 6 Hz theta-delta slowing. Hyperventilation and photic stimulation were not performed.   ABNORMALITY - Intermittent slow, generalized IMPRESSION: This study is suggestive of mild diffuse encephalopathy, nonspecific etiology. No seizures or epileptiform discharges were seen throughout the recording. Lora Havens   CT HEAD CODE STROKE WO CONTRAST  Result Date: 10/23/2020 CLINICAL DATA:  Code stroke. EXAM: CT HEAD WITHOUT CONTRAST TECHNIQUE: Contiguous axial images were obtained from the base of the skull through the vertex without intravenous contrast. COMPARISON:  07/11/2020 FINDINGS: Brain: No evidence of acute infarction, hemorrhage, hydrocephalus, extra-axial collection or mass lesion/mass effect. Generalized atrophy with ventriculomegaly. Remote perforator infarcts at the bilateral basal ganglia. Remote right superior cerebellar infarct. Vascular: No hyperdense vessel or unexpected calcification. Skull: Normal. Negative for fracture or focal lesion. Sinuses/Orbits: No acute finding. Other: These results were communicated  to Dr. Theda Sers at 7:47 am on 10/23/2020 by text page via the [facility] messaging system. ASPECTS [facility] Stroke Program Early CT Score) Not scored without localizing history. IMPRESSION: 1. No acute finding. 2. Atrophy with chronic perforator and right superior cerebellar infarcts. Electronically Signed   By: Jorje Guild M.D.   On: 10/23/2020 07:48   CT ANGIO HEAD CODE STROKE  Result Date: 10/23/2020 CLINICAL DATA:  Neuro deficit, code stroke EXAM: CT ANGIOGRAPHY HEAD AND NECK TECHNIQUE: Multidetector CT imaging of the head and neck was performed using the standard protocol during bolus administration of intravenous contrast. Multiplanar CT image reconstructions and MIPs were obtained to evaluate the vascular anatomy. Carotid stenosis measurements (when applicable) are obtained utilizing NASCET criteria, using the distal internal carotid diameter as the denominator. CONTRAST:  114m OMNIPAQUE IOHEXOL 350 MG/ML SOLN COMPARISON:  Same-day noncontrast CT head, brain MRI 07/06/2014, CT cervical spine 06/03/2020 FINDINGS: CTA NECK FINDINGS Aortic arch: There is mild calcified atherosclerotic plaque of the aortic arch. There is mild narrowing of the left subclavian artery at the level of the origin of the internal mammary artery. Right carotid system: There is soft and calcified atherosclerotic plaque in the proximal left internal carotid artery resulting in up to 60-70% stenosis. The distal internal carotid artery is patent. There is no dissection or aneurysm. Left carotid system: There is scattered soft plaque throughout the common carotid artery without hemodynamically significant stenosis. There is soft and calcified atherosclerotic plaque in the proximal left internal carotid artery without hemodynamically significant stenosis. There is no dissection or aneurysm. Vertebral arteries: There is calcified atherosclerotic plaque at the origin of the right vertebral artery resulting in at least moderate stenosis. The  remainder of the right vertebral artery is patent. The left vertebral artery is dominant. There is minimal calcified atherosclerotic plaque at the V2/V3 junction without significant stenosis. There is no dissection or aneurysm. Skeleton: There is multilevel degenerative change of the cervical spine, most advanced at C5-C6 through C7-T1. There is no acute osseous abnormality or aggressive osseous lesion. Other neck: The soft tissues are unremarkable. Upper chest: The lung apices are clear. Review of the MIP images confirms the above findings CTA HEAD FINDINGS Anterior circulation: There is calcified atherosclerotic plaque of the bilateral intracranial ICAs resulting in focal moderate to severe stenosis on the right (8-76), and mild stenosis on the left. There is mild irregularity of the right M1 and M2 branches due to atherosclerotic disease without focal high-grade stenosis or occlusion. There is  focal stenosis at the origin of a superior left M2 division (11-16). There is mild irregularity of the distal left MCA branches without other significant stenosis The right A1 segment is hypoplastic, a normal variant. Otherwise, the bilateral ACAs are patent. There is no aneurysm. Posterior circulation: There is a PICA termination of the right vertebral artery, a normal variant. The left V4 segment is patent. The basilar artery is patent. There is a fetal PCA on the right with mild multifocal irregularity of the posterior communicating artery. There is mild multifocal irregularity of the left P1 segment. There is no high-grade stenosis or occlusion. Venous sinuses: As permitted by contrast timing, patent. Anatomic variants: As above. Review of the MIP images confirms the above findings IMPRESSION: 1. No emergent large vessel occlusion in the head or neck. 2. Calcified atherosclerotic plaque in the bilateral carotid bulbs resulting in up to 60-70% stenosis on the right and no hemodynamically significant stenosis on the left.  3. Intracranial atherosclerotic disease as detailed above resulting in moderate to severe stenosis of the right intracranial ICA, focal stenosis at the origin of a superior left M2 division, and mild irregularity throughout the remainder of the intracranial vasculature. Electronically Signed   By: Valetta Mole M.D.   On: 10/23/2020 08:19   CT ANGIO NECK CODE STROKE  Result Date: 10/23/2020 CLINICAL DATA:  Neuro deficit, code stroke EXAM: CT ANGIOGRAPHY HEAD AND NECK TECHNIQUE: Multidetector CT imaging of the head and neck was performed using the standard protocol during bolus administration of intravenous contrast. Multiplanar CT image reconstructions and MIPs were obtained to evaluate the vascular anatomy. Carotid stenosis measurements (when applicable) are obtained utilizing NASCET criteria, using the distal internal carotid diameter as the denominator. CONTRAST:  174m OMNIPAQUE IOHEXOL 350 MG/ML SOLN COMPARISON:  Same-day noncontrast CT head, brain MRI 07/06/2014, CT cervical spine 06/03/2020 FINDINGS: CTA NECK FINDINGS Aortic arch: There is mild calcified atherosclerotic plaque of the aortic arch. There is mild narrowing of the left subclavian artery at the level of the origin of the internal mammary artery. Right carotid system: There is soft and calcified atherosclerotic plaque in the proximal left internal carotid artery resulting in up to 60-70% stenosis. The distal internal carotid artery is patent. There is no dissection or aneurysm. Left carotid system: There is scattered soft plaque throughout the common carotid artery without hemodynamically significant stenosis. There is soft and calcified atherosclerotic plaque in the proximal left internal carotid artery without hemodynamically significant stenosis. There is no dissection or aneurysm. Vertebral arteries: There is calcified atherosclerotic plaque at the origin of the right vertebral artery resulting in at least moderate stenosis. The remainder  of the right vertebral artery is patent. The left vertebral artery is dominant. There is minimal calcified atherosclerotic plaque at the V2/V3 junction without significant stenosis. There is no dissection or aneurysm. Skeleton: There is multilevel degenerative change of the cervical spine, most advanced at C5-C6 through C7-T1. There is no acute osseous abnormality or aggressive osseous lesion. Other neck: The soft tissues are unremarkable. Upper chest: The lung apices are clear. Review of the MIP images confirms the above findings CTA HEAD FINDINGS Anterior circulation: There is calcified atherosclerotic plaque of the bilateral intracranial ICAs resulting in focal moderate to severe stenosis on the right (8-76), and mild stenosis on the left. There is mild irregularity of the right M1 and M2 branches due to atherosclerotic disease without focal high-grade stenosis or occlusion. There is focal stenosis at the origin of a superior left M2 division (  11-16). There is mild irregularity of the distal left MCA branches without other significant stenosis The right A1 segment is hypoplastic, a normal variant. Otherwise, the bilateral ACAs are patent. There is no aneurysm. Posterior circulation: There is a PICA termination of the right vertebral artery, a normal variant. The left V4 segment is patent. The basilar artery is patent. There is a fetal PCA on the right with mild multifocal irregularity of the posterior communicating artery. There is mild multifocal irregularity of the left P1 segment. There is no high-grade stenosis or occlusion. Venous sinuses: As permitted by contrast timing, patent. Anatomic variants: As above. Review of the MIP images confirms the above findings IMPRESSION: 1. No emergent large vessel occlusion in the head or neck. 2. Calcified atherosclerotic plaque in the bilateral carotid bulbs resulting in up to 60-70% stenosis on the right and no hemodynamically significant stenosis on the left. 3.  Intracranial atherosclerotic disease as detailed above resulting in moderate to severe stenosis of the right intracranial ICA, focal stenosis at the origin of a superior left M2 division, and mild irregularity throughout the remainder of the intracranial vasculature. Electronically Signed   By: Valetta Mole M.D.   On: 10/23/2020 08:19      HISTORY OF PRESENT ILLNESS  ARN MCOMBER is a 75 y.o. male who resides in a SNF and presents as a code stroke via EMS. LKW is 0630 am when staff came in to help him get dressed and found him slumped over in wheelchair. He received TNK     HOSPITAL COURSE Mr. LOYS SHUGARS is a 75 y.o. male with history of dementia, diabetes, hypertension, hyperlipidemia, stroke, PVD admitted for slumped over in SNF, slurred speech, aphasia, generalized weakness.  TNK was given.   Likely encephalopathy versus syncope versus seizure, less likely TIA, s/p TNK CT no acute abnormality, old right superior cerebellum, right BG, left thalamus infarct. CTA head and neck right ICA bulb 60 to 70% stenosis, moderate to severe right terminal ICA and left M2. MRI no acute infarct 2D Echo pending EEG mild diffuse encephalopathy, no seizure LDL 107 HgbA1c 8.6 Lovenox for VTE prophylaxis aspirin 325 mg daily and clopidogrel 75 mg daily prior to admission, now on aspirin 81 and Plavix 75 DAPT for 3 weeks and then Plavix alone.  Ongoing aggressive stroke risk factor management Therapy recommendations: SNF Disposition: Pending   History of stroke 04/2014 admitted for slurred speech, right ataxia, dysphagia.  MRI showed right superior cerebellum, right frontal acute infarct, right BG/CR subacute infarct, as well as old bilateral thalami infarcts.  CTA head and neck intracranial stenosis with high-grade right A1, M2, PA, SCA, P1 and left P2 stenosis.  2D echo, LE venous Doppler and TEE negative.  Loop recorder placed.  LDL 172, discharged on DAPT for 3 months. 06/2014 admitted for punctate  right periventricular infarct, loop recorder interrogation negative.  A1c 7.4.  Recommend TEE but not done.   Diabetes HgbA1c 8.6 goal < 7.0 Uncontrolled CBG monitoring SSI DM education and close PCP follow up   Hypertension Stable on the high end Resume home Coreg, BiDil Long term BP goal normotensive   Hyperlipidemia Home meds: Lipitor 40 LDL 107, goal < 70 Now on Lipitor 80 Continue statin at discharge   Other Stroke Risk Factors Advanced age PVD   Other Active Problems Dementia, SNF resident  RN Pressure Injury Documentation: Pressure Injury 10/23/20 Ankle Right;Lateral Unstageable - Full thickness tissue loss in which the base of the injury is covered  by slough (yellow, tan, gray, green or brown) and/or eschar (tan, brown or black) in the wound bed. Malleolus (Active)  10/23/20 1600  Location: Ankle  Location Orientation: Right;Lateral  Staging: Unstageable - Full thickness tissue loss in which the base of the injury is covered by slough (yellow, tan, gray, green or brown) and/or eschar (tan, brown or black) in the wound bed.  Wound Description (Comments): Malleolus  Present on Admission:      Pressure Injury 10/23/20 Toe (Comment  which one) Right;Medial Unstageable - Full thickness tissue loss in which the base of the injury is covered by slough (yellow, tan, gray, green or brown) and/or eschar (tan, brown or black) in the wound bed. (Active)  10/23/20 1600  Location: Toe (Comment  which one)  Location Orientation: Right;Medial  Staging: Unstageable - Full thickness tissue loss in which the base of the injury is covered by slough (yellow, tan, gray, green or brown) and/or eschar (tan, brown or black) in the wound bed.  Wound Description (Comments):   Present on Admission: Yes     Pressure Injury 10/23/20 Toe (Comment  which one) Left;Medial Unstageable - Full thickness tissue loss in which the base of the injury is covered by slough (yellow, tan, gray, green or  brown) and/or eschar (tan, brown or black) in the wound bed. (Active)  10/23/20 1600  Location: Toe (Comment  which one)  Location Orientation: Left;Medial  Staging: Unstageable - Full thickness tissue loss in which the base of the injury is covered by slough (yellow, tan, gray, green or brown) and/or eschar (tan, brown or black) in the wound bed.  Wound Description (Comments):   Present on Admission: Yes     Pressure Injury 10/23/20 Ankle Left;Lateral Unstageable - Full thickness tissue loss in which the base of the injury is covered by slough (yellow, tan, gray, green or brown) and/or eschar (tan, brown or black) in the wound bed. (Active)  10/23/20 1600  Location: Ankle  Location Orientation: Left;Lateral  Staging: Unstageable - Full thickness tissue loss in which the base of the injury is covered by slough (yellow, tan, gray, green or brown) and/or eschar (tan, brown or black) in the wound bed.  Wound Description (Comments):   Present on Admission: Yes     DISCHARGE EXAM Blood pressure 115/63, pulse 75, temperature 98 F (36.7 C), temperature source Oral, resp. rate 18, height '5\' 9"'  (1.753 m), weight 67.2 kg, SpO2 98 %.  General - Well nourished, well developed, in no apparent distress.   Ophthalmologic - fundi not visualized due to noncooperation.   Cardiovascular - Regular rhythm and rate.   Neuro - awake, alert, eyes open, orientated to age, place, time. No aphasia, however, severe dysarthria, difficult to understand, following all simple commands. Able to name and repeat. No gaze palsy, tracking bilaterally, visual field full, PERRL. No facial droop. Tongue midline but poor denture. Bilateral UEs 5/5, no drift. Bilaterally LEs proximal 4/5, however distally no movement of right ankle or foot, left ankle no movement, left toe DF/PF 2+/5. Sensation symmetrical bilaterally, b/l FTN intact grossly with mild action tremor, gait not tested.   Discharge Diet       Diet   Diet Carb  Modified Fluid consistency: Thin; Room service appropriate? Yes   liquids  DISCHARGE PLAN Disposition:  SNF aspirin 81 mg daily and clopidogrel 75 mg daily for secondary stroke prevention for 3 weeks then Plavix alone. Ongoing stroke risk factor control by Primary Care Physician at time of discharge  Follow-up PCP Wenda Low, MD in 2 weeks.   35 minutes were spent preparing discharge.  Rosalin Hawking, MD PhD Stroke Neurology 10/25/2020 5:21 PM

## 2020-10-25 NOTE — Discharge Summary (Deleted)
Stroke Discharge Summary  Patient ID: Hayden Mckinney   MRN: [id number]      DOB: [birth year]  Date of Admission: 10/23/2020 Date of Discharge: 10/25/2020  Attending Physician:  Stroke, Md, MD, Stroke MD Patient's PCP:  Wenda Low, MD  DISCHARGE DIAGNOSIS: Likely encephalopathy versus syncope versus seizure, less likely TIA, s/p TNK Principal Problem:   Encephalopathy Active Problems:   Type II diabetes mellitus with neurological manifestations ([place])   HTN (hypertension)   Hyperlipidemia      LABORATORY STUDIES CBC    Component Value Date/Time   WBC 8.4 10/23/2020 0738   RBC 3.15 (L) 10/23/2020 0738   HGB 8.5 (L) 10/23/2020 0739   HCT 25.0 (L) 10/23/2020 0739   PLT 249 10/23/2020 0738   MCV 90.5 10/23/2020 0738   MCH 28.3 10/23/2020 0738   MCHC 31.2 10/23/2020 0738   RDW 12.5 10/23/2020 0738   LYMPHSABS 2.9 10/23/2020 0738   MONOABS 0.7 10/23/2020 0738   EOSABS 0.1 10/23/2020 0738   BASOSABS 0.1 10/23/2020 0738   CMP    Component Value Date/Time   NA 140 10/23/2020 0739   K 4.5 10/23/2020 0739   CL 107 10/23/2020 0739   CO2 24 10/23/2020 0738   GLUCOSE 185 (H) 10/23/2020 0739   BUN 26 (H) 10/23/2020 0739   CREATININE 3.00 (H) 10/23/2020 0739   CREATININE 0.97 12/15/2010 1343   CALCIUM 7.7 (L) 10/23/2020 0738   PROT 4.9 (L) 10/23/2020 0738   ALBUMIN 1.9 (L) 10/23/2020 0738   AST 11 (L) 10/23/2020 0738   ALT 10 10/23/2020 0738   ALKPHOS 64 10/23/2020 0738   BILITOT 0.7 10/23/2020 0738   GFRNONAA 20 (L) 10/23/2020 0738   GFRNONAA 81 12/15/2010 1343   GFRAA 43 (L) 06/03/2015 2158   GFRAA >89 12/15/2010 1343   COAGS Lab Results  Component Value Date   INR 1.0 10/23/2020   INR 1.09 07/06/2014   INR 0.98 08/07/2010   Lipid Panel    Component Value Date/Time   CHOL 176 10/24/2020 0443   TRIG 166 (H) 10/24/2020 0443   HDL 36 (L) 10/24/2020 0443   CHOLHDL 4.9 10/24/2020 0443   VLDL 33 10/24/2020 0443   LDLCALC 107 (H) 10/24/2020 0443    HgbA1C  Lab Results  Component Value Date   HGBA1C 8.6 (H) 10/23/2020   Urinalysis    Component Value Date/Time   COLORURINE YELLOW 07/11/2020 1418   APPEARANCEUR CLEAR 07/11/2020 1418   LABSPEC 1.014 07/11/2020 1418   PHURINE 5.0 07/11/2020 1418   GLUCOSEU 50 (A) 07/11/2020 1418   HGBUR NEGATIVE 07/11/2020 1418   [place] 07/11/2020 1418   KETONESUR NEGATIVE 07/11/2020 1418   PROTEINUR >=300 (A) 07/11/2020 1418   UROBILINOGEN 0.2 07/08/2014 0544   NITRITE NEGATIVE 07/11/2020 1418   LEUKOCYTESUR NEGATIVE 07/11/2020 1418   Urine Drug Screen No results found for: LABOPIA, COCAINSCRNUR, LABBENZ, AMPHETMU, THCU, LABBARB  Alcohol Level    Component Value Date/Time   ETH <5 06/03/2015 2158     SIGNIFICANT DIAGNOSTIC STUDIES CT HEAD WO CONTRAST (5MM)  Result Date: 10/23/2020 CLINICAL DATA:  Stroke, follow up post TNK or stat with any change in neuro checks EXAM: CT HEAD WITHOUT CONTRAST TECHNIQUE: Contiguous axial images were obtained from the base of the skull through the vertex without intravenous contrast. COMPARISON:  CT from the same day FINDINGS: Brain: No evidence of acute large vascular territory infarction, hemorrhage, hydrocephalus, extra-axial collection or mass lesion/mass effect. Similar generalized atrophy with  ex vacuo ventricular dilation. Similar remote perforator infarcts in bilateral basal ganglia and remote right superior cerebellar infarcts. Vascular: No hyperdense vessel identified. Calcific intracranial atherosclerosis. Skull: No acute fracture. Sinuses/Orbits: Ethmoid air cell mucosal thickening. No acute orbital findings. Other: No mastoid effusions. IMPRESSION: No acute findings.  Similar atrophy and chronic infarcts. Electronically Signed   By: Margaretha Sheffield M.D.   On: 10/23/2020 14:49   [name] CONTRAST  Result Date: 10/24/2020 CLINICAL DATA:  Neuro deficit, acute, stroke suspected. EXAM: MRI HEAD WITHOUT CONTRAST TECHNIQUE:  Multiplanar, multiecho pulse sequences of the brain and surrounding structures were obtained without intravenous contrast. COMPARISON:  Prior head CT 10/23/2020. CT angiogram head/neck 10/23/2020. Brain MRI 07/06/2014. FINDINGS: Brain: Intermittently motion degraded exam, limiting evaluation. Most notably, there is moderate motion degradation of the axial diffusion-weighted sequence, severe motion degradation of the axial T1 weighted sequence and moderate motion degradation of the coronal T2 TSE sequence. Mild-to-moderate generalized cerebral atrophy with associated prominence of the ventricles and sulci. Redemonstrated chronic small-vessel infarcts within the bilateral basal ganglia, corpus callosum, bilateral thalami and within the pons. Moderate multifocal T2 FLAIR hyperintense signal abnormality within the cerebral white matter, nonspecific but compatible chronic small vessel ischemic disease. Mild chronic small vessel ischemic changes are also present within the pons. Redemonstrated chronic infarcts within the right cerebellar hemisphere. There is no acute infarct. No evidence of an intracranial mass. No extra-axial fluid collection. No midline shift. Vascular: Maintained flow voids within the proximal large arterial vessels. Skull and upper cervical spine: No focal suspicious marrow lesion. Sinuses/Orbits: Visualized orbits show no acute finding. Trace mucosal thickening within the bilateral ethmoid and right maxillary sinuses. Superimposed 2 cm right maxillary sinus mucous retention cyst. IMPRESSION: Motion degraded exam, as described. Redemonstrated chronic small-vessel infarcts within the bilateral basal ganglia, corpus callosum, bilateral thalami and within the pons. Background chronic small vessel ischemic changes which are moderate in the cerebral white matter, and mild in the pons. Redemonstrated chronic infarcts within the right cerebellar hemisphere. Mild-to-moderate generalized cerebral atrophy.  Paranasal sinus disease, as outlined. Electronically Signed   By: Kellie Simmering D.O.   On: 10/24/2020 09:44   EEG adult  Result Date: 10/24/2020 Lora Havens, MD     10/24/2020  5:29 PM Patient Name: Hayden Mckinney MRN: [id number] Epilepsy Attending: Lora Havens Referring Physician/Provider: Dr Rosalin Hawking Date: 10/24/2020 Duration: 23.45 mins Patient history: 75 year old male with altered mental status and speech disturbance.  EEG to evaluate for seizure. Level of alertness: Awake AEDs during EEG study: None Technical aspects: This EEG study was done with scalp electrodes positioned according to the 10-20 International system of electrode placement. Electrical activity was acquired at a sampling rate of 500Hz  and reviewed with a high frequency filter of 70Hz  and a low frequency filter of 1Hz . EEG data were recorded continuously and digitally stored. Description: The posterior dominant rhythm consists of 7.5Hz  activity of moderate voltage (25-35 uV) seen predominantly in posterior head regions, symmetric and reactive to eye opening and eye closing. EEG showed intermittent generalized 5 to 6 Hz theta-delta slowing. Hyperventilation and photic stimulation were not performed.   ABNORMALITY - Intermittent slow, generalized IMPRESSION: This study is suggestive of mild diffuse encephalopathy, nonspecific etiology. No seizures or epileptiform discharges were seen throughout the recording. Lora Havens   CT HEAD CODE STROKE WO CONTRAST  Result Date: 10/23/2020 CLINICAL DATA:  Code stroke. EXAM: CT HEAD WITHOUT CONTRAST TECHNIQUE: Contiguous axial images were obtained from the base of the  skull through the vertex without intravenous contrast. COMPARISON:  07/11/2020 FINDINGS: Brain: No evidence of acute infarction, hemorrhage, hydrocephalus, extra-axial collection or mass lesion/mass effect. Generalized atrophy with ventriculomegaly. Remote perforator infarcts at the bilateral basal ganglia. Remote  right superior cerebellar infarct. Vascular: No hyperdense vessel or unexpected calcification. Skull: Normal. Negative for fracture or focal lesion. Sinuses/Orbits: No acute finding. Other: These results were communicated to Dr. Theda Sers at 7:47 am on 10/23/2020 by text page via the [facility] messaging system. ASPECTS [facility] Stroke Program Early CT Score) Not scored without localizing history. IMPRESSION: 1. No acute finding. 2. Atrophy with chronic perforator and right superior cerebellar infarcts. Electronically Signed   By: Jorje Guild M.D.   On: 10/23/2020 07:48   CT ANGIO HEAD CODE STROKE  Result Date: 10/23/2020 CLINICAL DATA:  Neuro deficit, code stroke EXAM: CT ANGIOGRAPHY HEAD AND NECK TECHNIQUE: Multidetector CT imaging of the head and neck was performed using the standard protocol during bolus administration of intravenous contrast. Multiplanar CT image reconstructions and MIPs were obtained to evaluate the vascular anatomy. Carotid stenosis measurements (when applicable) are obtained utilizing NASCET criteria, using the distal internal carotid diameter as the denominator. CONTRAST:  181mL OMNIPAQUE IOHEXOL 350 MG/ML SOLN COMPARISON:  Same-day noncontrast CT head, brain MRI 07/06/2014, CT cervical spine 06/03/2020 FINDINGS: CTA NECK FINDINGS Aortic arch: There is mild calcified atherosclerotic plaque of the aortic arch. There is mild narrowing of the left subclavian artery at the level of the origin of the internal mammary artery. Right carotid system: There is soft and calcified atherosclerotic plaque in the proximal left internal carotid artery resulting in up to 60-70% stenosis. The distal internal carotid artery is patent. There is no dissection or aneurysm. Left carotid system: There is scattered soft plaque throughout the common carotid artery without hemodynamically significant stenosis. There is soft and calcified atherosclerotic plaque in the proximal left internal carotid artery without  hemodynamically significant stenosis. There is no dissection or aneurysm. Vertebral arteries: There is calcified atherosclerotic plaque at the origin of the right vertebral artery resulting in at least moderate stenosis. The remainder of the right vertebral artery is patent. The left vertebral artery is dominant. There is minimal calcified atherosclerotic plaque at the V2/V3 junction without significant stenosis. There is no dissection or aneurysm. Skeleton: There is multilevel degenerative change of the cervical spine, most advanced at C5-C6 through C7-T1. There is no acute osseous abnormality or aggressive osseous lesion. Other neck: The soft tissues are unremarkable. Upper chest: The lung apices are clear. Review of the MIP images confirms the above findings CTA HEAD FINDINGS Anterior circulation: There is calcified atherosclerotic plaque of the bilateral intracranial ICAs resulting in focal moderate to severe stenosis on the right (8-76), and mild stenosis on the left. There is mild irregularity of the right M1 and M2 branches due to atherosclerotic disease without focal high-grade stenosis or occlusion. There is focal stenosis at the origin of a superior left M2 division (11-16). There is mild irregularity of the distal left MCA branches without other significant stenosis The right A1 segment is hypoplastic, a normal variant. Otherwise, the bilateral ACAs are patent. There is no aneurysm. Posterior circulation: There is a PICA termination of the right vertebral artery, a normal variant. The left V4 segment is patent. The basilar artery is patent. There is a fetal PCA on the right with mild multifocal irregularity of the posterior communicating artery. There is mild multifocal irregularity of the left P1 segment. There is no high-grade stenosis or occlusion.  Venous sinuses: As permitted by contrast timing, patent. Anatomic variants: As above. Review of the MIP images confirms the above findings IMPRESSION: 1. No  emergent large vessel occlusion in the head or neck. 2. Calcified atherosclerotic plaque in the bilateral carotid bulbs resulting in up to 60-70% stenosis on the right and no hemodynamically significant stenosis on the left. 3. Intracranial atherosclerotic disease as detailed above resulting in moderate to severe stenosis of the right intracranial ICA, focal stenosis at the origin of a superior left M2 division, and mild irregularity throughout the remainder of the intracranial vasculature. Electronically Signed   By: Valetta Mole M.D.   On: 10/23/2020 08:19   CT ANGIO NECK CODE STROKE  Result Date: 10/23/2020 CLINICAL DATA:  Neuro deficit, code stroke EXAM: CT ANGIOGRAPHY HEAD AND NECK TECHNIQUE: Multidetector CT imaging of the head and neck was performed using the standard protocol during bolus administration of intravenous contrast. Multiplanar CT image reconstructions and MIPs were obtained to evaluate the vascular anatomy. Carotid stenosis measurements (when applicable) are obtained utilizing NASCET criteria, using the distal internal carotid diameter as the denominator. CONTRAST:  134mL OMNIPAQUE IOHEXOL 350 MG/ML SOLN COMPARISON:  Same-day noncontrast CT head, brain MRI 07/06/2014, CT cervical spine 06/03/2020 FINDINGS: CTA NECK FINDINGS Aortic arch: There is mild calcified atherosclerotic plaque of the aortic arch. There is mild narrowing of the left subclavian artery at the level of the origin of the internal mammary artery. Right carotid system: There is soft and calcified atherosclerotic plaque in the proximal left internal carotid artery resulting in up to 60-70% stenosis. The distal internal carotid artery is patent. There is no dissection or aneurysm. Left carotid system: There is scattered soft plaque throughout the common carotid artery without hemodynamically significant stenosis. There is soft and calcified atherosclerotic plaque in the proximal left internal carotid artery without  hemodynamically significant stenosis. There is no dissection or aneurysm. Vertebral arteries: There is calcified atherosclerotic plaque at the origin of the right vertebral artery resulting in at least moderate stenosis. The remainder of the right vertebral artery is patent. The left vertebral artery is dominant. There is minimal calcified atherosclerotic plaque at the V2/V3 junction without significant stenosis. There is no dissection or aneurysm. Skeleton: There is multilevel degenerative change of the cervical spine, most advanced at C5-C6 through C7-T1. There is no acute osseous abnormality or aggressive osseous lesion. Other neck: The soft tissues are unremarkable. Upper chest: The lung apices are clear. Review of the MIP images confirms the above findings CTA HEAD FINDINGS Anterior circulation: There is calcified atherosclerotic plaque of the bilateral intracranial ICAs resulting in focal moderate to severe stenosis on the right (8-76), and mild stenosis on the left. There is mild irregularity of the right M1 and M2 branches due to atherosclerotic disease without focal high-grade stenosis or occlusion. There is focal stenosis at the origin of a superior left M2 division (11-16). There is mild irregularity of the distal left MCA branches without other significant stenosis The right A1 segment is hypoplastic, a normal variant. Otherwise, the bilateral ACAs are patent. There is no aneurysm. Posterior circulation: There is a PICA termination of the right vertebral artery, a normal variant. The left V4 segment is patent. The basilar artery is patent. There is a fetal PCA on the right with mild multifocal irregularity of the posterior communicating artery. There is mild multifocal irregularity of the left P1 segment. There is no high-grade stenosis or occlusion. Venous sinuses: As permitted by contrast timing, patent. Anatomic variants: As  above. Review of the MIP images confirms the above findings IMPRESSION: 1. No  emergent large vessel occlusion in the head or neck. 2. Calcified atherosclerotic plaque in the bilateral carotid bulbs resulting in up to 60-70% stenosis on the right and no hemodynamically significant stenosis on the left. 3. Intracranial atherosclerotic disease as detailed above resulting in moderate to severe stenosis of the right intracranial ICA, focal stenosis at the origin of a superior left M2 division, and mild irregularity throughout the remainder of the intracranial vasculature. Electronically Signed   By: Valetta Mole M.D.   On: 10/23/2020 08:19      HISTORY OF PRESENT ILLNESS  ZELL HYLTON is a 75 y.o. male who resides in a SNF and presents as a code stroke via EMS. LKW is 0630 am when staff came in to help him get dressed and found him slumped over in wheelchair. He received TNK   HOSPITAL COURSE Likely encephalopathy versus syncope versus seizure, less likely TIA, s/p TNK History of stroke Hypertension Hyperlipidemia CT head revealed no acute abnormality, old right superior cerebellum, right BG, left thalamus infarct.CTA head and neck right ICA bulb 60 to 70% stenosis, moderate to severe right terminal ICA and left M2. MRI brain with no acute process. Echo is pending. EEG with mild diffuse encephalopathy, no seizure. LDL-c was 107, home atorvastatin was increased from 40mg  to 80mg  daily. He has been placed on ASA 81mg  and Plavix 75mg  for 3 weeks than Plavix alone. HTN is stable and was restarted on home meds. Long term goal is normotensive.  04/2014 admitted for slurred speech, right ataxia, dysphagia.  MRI showed right superior cerebellum, right frontal acute infarct, right BG/CR subacute infarct, as well as old bilateral thalami infarcts.  CTA head and neck intracranial stenosis with high-grade right A1, M2, PA, SCA, P1 and left P2 stenosis.  2D echo, LE venous Doppler and TEE negative.  Loop recorder placed.  LDL 172, discharged on DAPT for 3 months. 06/2014 admitted for punctate  right periventricular infarct, loop recorder interrogation negative.  A1c 7.4.  Recommend TEE but not done.  Diabetes Is uncontrolled with HGBA1c 8.6 with goal of <7. Will need follow up with PCP to manage DM    RN Pressure Injury Documentation: Pressure Injury 10/23/20 Ankle Right;Lateral Unstageable - Full thickness tissue loss in which the base of the injury is covered by slough (yellow, tan, gray, green or brown) and/or eschar (tan, brown or black) in the wound bed. Malleolus (Active)  10/23/20 1600  Location: Ankle  Location Orientation: Right;Lateral  Staging: Unstageable - Full thickness tissue loss in which the base of the injury is covered by slough (yellow, tan, gray, green or brown) and/or eschar (tan, brown or black) in the wound bed.  Wound Description (Comments): Malleolus  Present on Admission:      Pressure Injury 10/23/20 Toe (Comment  which one) Right;Medial Unstageable - Full thickness tissue loss in which the base of the injury is covered by slough (yellow, tan, gray, green or brown) and/or eschar (tan, brown or black) in the wound bed. (Active)  10/23/20 1600  Location: Toe (Comment  which one)  Location Orientation: Right;Medial  Staging: Unstageable - Full thickness tissue loss in which the base of the injury is covered by slough (yellow, tan, gray, green or brown) and/or eschar (tan, brown or black) in the wound bed.  Wound Description (Comments):   Present on Admission: Yes     Pressure Injury 10/23/20 Toe (Comment  which one)  Left;Medial Unstageable - Full thickness tissue loss in which the base of the injury is covered by slough (yellow, tan, gray, green or brown) and/or eschar (tan, brown or black) in the wound bed. (Active)  10/23/20 1600  Location: Toe (Comment  which one)  Location Orientation: Left;Medial  Staging: Unstageable - Full thickness tissue loss in which the base of the injury is covered by slough (yellow, tan, gray, green or brown) and/or eschar  (tan, brown or black) in the wound bed.  Wound Description (Comments):   Present on Admission: Yes     Pressure Injury 10/23/20 Ankle Left;Lateral Unstageable - Full thickness tissue loss in which the base of the injury is covered by slough (yellow, tan, gray, green or brown) and/or eschar (tan, brown or black) in the wound bed. (Active)  10/23/20 1600  Location: Ankle  Location Orientation: Left;Lateral  Staging: Unstageable - Full thickness tissue loss in which the base of the injury is covered by slough (yellow, tan, gray, green or brown) and/or eschar (tan, brown or black) in the wound bed.  Wound Description (Comments):   Present on Admission: Yes     DISCHARGE EXAM Blood pressure 115/63, pulse 75, temperature 98 F (36.7 C), temperature source Oral, resp. rate 18, height 5\' 9"  (1.753 m), weight 67.2 kg, SpO2 98 %.   Temp:  [97.8 F (36.6 C)-98.7 F (37.1 C)] 98 F (36.7 C) (10/18 1245) Pulse Rate:  [61-78] 75 (10/18 1245) Resp:  [11-22] 18 (10/18 1245) BP: (115-145)/(50-83) 115/63 (10/18 1245) SpO2:  [96 %-98 %] 98 % (10/18 1245)  General - Well nourished, well developed, in no apparent distress.  Cardiovascular - Regular rhythm and rate.  Mental Status -  Level of arousal and orientation to time, place, and person were intact. Language including expression, naming, repetition, comprehension was assessed and found intact.Has severe dysarthria, difficult to understand at times.  Attention span and concentration were normal.  Cranial Nerves II - XII - II - Visual field intact OU. III, IV, VI - Extraocular movements intact. V - Facial sensation intact bilaterally. VII - Facial movement intact bilaterally. VIII - Hearing & vestibular intact bilaterally. X - Palate elevates symmetrically. XI - Chin turning & shoulder shrug intact bilaterally. XII - Tongue protrusion intact.  Motor Strength - Bilateral uppers are 5/5, bilateral lowers are 4/5  Motor Tone - Muscle tone  was assessed at the neck and appendages and was normal.  Sensory - Light touch, temperature/pinprick were assessed and were symmetrical.    Coordination - The patient had normal movements in the hands and feet with no ataxia or dysmetria.  Tremor was absent.  Gait and Station - deferred  Discharge Diet       Diet   Diet Carb Modified Fluid consistency: Thin; Room service appropriate? Yes   liquids  DISCHARGE PLAN Disposition:  SNF aspirin 81 mg daily and clopidogrel 75 mg daily for secondary stroke prevention for 3 weeks then Plavix alone. Ongoing stroke risk factor control by Primary Care Physician at time of discharge Follow-up PCP Wenda Low, MD in 2 weeks.  40 minutes were spent preparing discharge.  Beulah Gandy, NP

## 2020-12-07 ENCOUNTER — Emergency Department (HOSPITAL_COMMUNITY): Payer: Medicare Other

## 2020-12-07 ENCOUNTER — Inpatient Hospital Stay (HOSPITAL_COMMUNITY)
Admission: EM | Admit: 2020-12-07 | Discharge: 2020-12-13 | DRG: 064 | Disposition: A | Discharge status: Skilled Nursing Facility | Payer: Medicare Other | Source: Skilled Nursing Facility | Attending: Internal Medicine | Admitting: Internal Medicine

## 2020-12-07 ENCOUNTER — Other Ambulatory Visit: Payer: Self-pay

## 2020-12-07 DIAGNOSIS — N184 Chronic kidney disease, stage 4 (severe): Secondary | ICD-10-CM | POA: Diagnosis present

## 2020-12-07 DIAGNOSIS — Z833 Family history of diabetes mellitus: Secondary | ICD-10-CM | POA: Diagnosis not present

## 2020-12-07 DIAGNOSIS — I10 Essential (primary) hypertension: Secondary | ICD-10-CM | POA: Diagnosis not present

## 2020-12-07 DIAGNOSIS — I6381 Other cerebral infarction due to occlusion or stenosis of small artery: Secondary | ICD-10-CM | POA: Diagnosis present

## 2020-12-07 DIAGNOSIS — E1151 Type 2 diabetes mellitus with diabetic peripheral angiopathy without gangrene: Secondary | ICD-10-CM | POA: Diagnosis present

## 2020-12-07 DIAGNOSIS — E1142 Type 2 diabetes mellitus with diabetic polyneuropathy: Secondary | ICD-10-CM | POA: Diagnosis present

## 2020-12-07 DIAGNOSIS — E785 Hyperlipidemia, unspecified: Secondary | ICD-10-CM | POA: Diagnosis present

## 2020-12-07 DIAGNOSIS — Z7902 Long term (current) use of antithrombotics/antiplatelets: Secondary | ICD-10-CM

## 2020-12-07 DIAGNOSIS — R471 Dysarthria and anarthria: Secondary | ICD-10-CM | POA: Diagnosis present

## 2020-12-07 DIAGNOSIS — I6310 Cerebral infarction due to embolism of unspecified precerebral artery: Secondary | ICD-10-CM | POA: Diagnosis not present

## 2020-12-07 DIAGNOSIS — I639 Cerebral infarction, unspecified: Secondary | ICD-10-CM | POA: Diagnosis not present

## 2020-12-07 DIAGNOSIS — R339 Retention of urine, unspecified: Secondary | ICD-10-CM | POA: Diagnosis not present

## 2020-12-07 DIAGNOSIS — E43 Unspecified severe protein-calorie malnutrition: Secondary | ICD-10-CM | POA: Diagnosis present

## 2020-12-07 DIAGNOSIS — Z8673 Personal history of transient ischemic attack (TIA), and cerebral infarction without residual deficits: Secondary | ICD-10-CM

## 2020-12-07 DIAGNOSIS — I6521 Occlusion and stenosis of right carotid artery: Secondary | ICD-10-CM | POA: Diagnosis present

## 2020-12-07 DIAGNOSIS — I129 Hypertensive chronic kidney disease with stage 1 through stage 4 chronic kidney disease, or unspecified chronic kidney disease: Secondary | ICD-10-CM | POA: Diagnosis present

## 2020-12-07 DIAGNOSIS — Z20822 Contact with and (suspected) exposure to covid-19: Secondary | ICD-10-CM | POA: Diagnosis present

## 2020-12-07 DIAGNOSIS — A09 Infectious gastroenteritis and colitis, unspecified: Secondary | ICD-10-CM | POA: Diagnosis not present

## 2020-12-07 DIAGNOSIS — R112 Nausea with vomiting, unspecified: Secondary | ICD-10-CM | POA: Diagnosis present

## 2020-12-07 DIAGNOSIS — A0472 Enterocolitis due to Clostridium difficile, not specified as recurrent: Secondary | ICD-10-CM | POA: Diagnosis present

## 2020-12-07 DIAGNOSIS — I6389 Other cerebral infarction: Secondary | ICD-10-CM | POA: Diagnosis not present

## 2020-12-07 DIAGNOSIS — E7800 Pure hypercholesterolemia, unspecified: Secondary | ICD-10-CM | POA: Diagnosis not present

## 2020-12-07 DIAGNOSIS — I672 Cerebral atherosclerosis: Secondary | ICD-10-CM | POA: Diagnosis present

## 2020-12-07 DIAGNOSIS — Z794 Long term (current) use of insulin: Secondary | ICD-10-CM

## 2020-12-07 DIAGNOSIS — E1122 Type 2 diabetes mellitus with diabetic chronic kidney disease: Secondary | ICD-10-CM | POA: Diagnosis present

## 2020-12-07 DIAGNOSIS — R4182 Altered mental status, unspecified: Secondary | ICD-10-CM | POA: Diagnosis not present

## 2020-12-07 DIAGNOSIS — Z7984 Long term (current) use of oral hypoglycemic drugs: Secondary | ICD-10-CM | POA: Diagnosis not present

## 2020-12-07 DIAGNOSIS — Z79899 Other long term (current) drug therapy: Secondary | ICD-10-CM

## 2020-12-07 DIAGNOSIS — F0390 Unspecified dementia without behavioral disturbance: Secondary | ICD-10-CM | POA: Diagnosis present

## 2020-12-07 DIAGNOSIS — R197 Diarrhea, unspecified: Secondary | ICD-10-CM | POA: Diagnosis present

## 2020-12-07 DIAGNOSIS — E1149 Type 2 diabetes mellitus with other diabetic neurological complication: Secondary | ICD-10-CM | POA: Diagnosis present

## 2020-12-07 DIAGNOSIS — E8729 Other acidosis: Secondary | ICD-10-CM | POA: Diagnosis present

## 2020-12-07 DIAGNOSIS — I6340 Cerebral infarction due to embolism of unspecified cerebral artery: Secondary | ICD-10-CM | POA: Insufficient documentation

## 2020-12-07 DIAGNOSIS — E860 Dehydration: Secondary | ICD-10-CM | POA: Diagnosis present

## 2020-12-07 DIAGNOSIS — I6322 Cerebral infarction due to unspecified occlusion or stenosis of basilar arteries: Secondary | ICD-10-CM | POA: Diagnosis not present

## 2020-12-07 DIAGNOSIS — Z7982 Long term (current) use of aspirin: Secondary | ICD-10-CM

## 2020-12-07 DIAGNOSIS — G9341 Metabolic encephalopathy: Secondary | ICD-10-CM | POA: Diagnosis present

## 2020-12-07 LAB — I-STAT VENOUS BLOOD GAS, ED
Acid-base deficit: 4 mmol/L — ABNORMAL HIGH (ref 0.0–2.0)
Bicarbonate: 20.5 mmol/L (ref 20.0–28.0)
Calcium, Ion: 1.14 mmol/L — ABNORMAL LOW (ref 1.15–1.40)
HCT: 30 % — ABNORMAL LOW (ref 39.0–52.0)
Hemoglobin: 10.2 g/dL — ABNORMAL LOW (ref 13.0–17.0)
O2 Saturation: 92 %
Potassium: 4.9 mmol/L (ref 3.5–5.1)
Sodium: 141 mmol/L (ref 135–145)
TCO2: 22 mmol/L (ref 22–32)
pCO2, Ven: 33.5 mmHg — ABNORMAL LOW (ref 44.0–60.0)
pH, Ven: 7.395 (ref 7.250–7.430)
pO2, Ven: 64 mmHg — ABNORMAL HIGH (ref 32.0–45.0)

## 2020-12-07 LAB — COMPREHENSIVE METABOLIC PANEL
ALT: 8 U/L (ref 0–44)
AST: 14 U/L — ABNORMAL LOW (ref 15–41)
Albumin: 1.9 g/dL — ABNORMAL LOW (ref 3.5–5.0)
Alkaline Phosphatase: 70 U/L (ref 38–126)
Anion gap: 6 (ref 5–15)
BUN: 25 mg/dL — ABNORMAL HIGH (ref 8–23)
CO2: 21 mmol/L — ABNORMAL LOW (ref 22–32)
Calcium: 8 mg/dL — ABNORMAL LOW (ref 8.9–10.3)
Chloride: 113 mmol/L — ABNORMAL HIGH (ref 98–111)
Creatinine, Ser: 2.75 mg/dL — ABNORMAL HIGH (ref 0.61–1.24)
GFR, Estimated: 23 mL/min — ABNORMAL LOW (ref >60)
Glucose, Bld: 89 mg/dL (ref 70–99)
Potassium: 5 mmol/L (ref 3.5–5.1)
Sodium: 140 mmol/L (ref 135–145)
Total Bilirubin: 0.4 mg/dL (ref 0.3–1.2)
Total Protein: 5 g/dL — ABNORMAL LOW (ref 6.5–8.1)

## 2020-12-07 LAB — CBC WITH DIFFERENTIAL/PLATELET
Abs Immature Granulocytes: 0.06 10*3/uL (ref 0.00–0.07)
Basophils Absolute: 0.1 10*3/uL (ref 0.0–0.1)
Basophils Relative: 0 %
Eosinophils Absolute: 0.1 10*3/uL (ref 0.0–0.5)
Eosinophils Relative: 1 %
HCT: 32.2 % — ABNORMAL LOW (ref 39.0–52.0)
Hemoglobin: 9.7 g/dL — ABNORMAL LOW (ref 13.0–17.0)
Immature Granulocytes: 1 %
Lymphocytes Relative: 15 %
Lymphs Abs: 1.8 10*3/uL (ref 0.7–4.0)
MCH: 27.8 pg (ref 26.0–34.0)
MCHC: 30.1 g/dL (ref 30.0–36.0)
MCV: 92.3 fL (ref 80.0–100.0)
Monocytes Absolute: 0.8 10*3/uL (ref 0.1–1.0)
Monocytes Relative: 7 %
Neutro Abs: 8.8 10*3/uL — ABNORMAL HIGH (ref 1.7–7.7)
Neutrophils Relative %: 76 %
Platelets: 227 10*3/uL (ref 150–400)
RBC: 3.49 MIL/uL — ABNORMAL LOW (ref 4.22–5.81)
RDW: 13.5 % (ref 11.5–15.5)
WBC: 11.6 10*3/uL — ABNORMAL HIGH (ref 4.0–10.5)
nRBC: 0 % (ref 0.0–0.2)

## 2020-12-07 LAB — CBG MONITORING, ED: Glucose-Capillary: 91 mg/dL (ref 70–99)

## 2020-12-07 LAB — URINALYSIS, ROUTINE W REFLEX MICROSCOPIC
Bilirubin Urine: NEGATIVE
Glucose, UA: 100 mg/dL — AB
Ketones, ur: NEGATIVE mg/dL
Leukocytes,Ua: NEGATIVE
Nitrite: NEGATIVE
Protein, ur: >300 mg/dL — AB
Specific Gravity, Urine: 1.025 (ref 1.005–1.030)
pH: 6 (ref 5.0–8.0)

## 2020-12-07 LAB — TROPONIN I (HIGH SENSITIVITY): Troponin I (High Sensitivity): 8 ng/L (ref <18)

## 2020-12-07 LAB — URINALYSIS, MICROSCOPIC (REFLEX): Squamous Epithelial / HPF: NONE SEEN (ref 0–5)

## 2020-12-07 LAB — LACTIC ACID, PLASMA: Lactic Acid, Venous: 1.3 mmol/L (ref 0.5–1.9)

## 2020-12-07 LAB — C DIFFICILE QUICK SCREEN W PCR REFLEX
C Diff antigen: POSITIVE — AB
C Diff toxin: NEGATIVE

## 2020-12-07 LAB — AMMONIA: Ammonia: 20 umol/L (ref 9–35)

## 2020-12-07 LAB — RESP PANEL BY RT-PCR (FLU A&B, COVID) ARPGX2
Influenza A by PCR: NEGATIVE
Influenza B by PCR: NEGATIVE
SARS Coronavirus 2 by RT PCR: NEGATIVE

## 2020-12-07 LAB — CLOSTRIDIUM DIFFICILE BY PCR, REFLEXED: Toxigenic C. Difficile by PCR: POSITIVE — AB

## 2020-12-07 LAB — LIPASE, BLOOD: Lipase: 21 U/L (ref 11–51)

## 2020-12-07 MED ORDER — LORAZEPAM 2 MG/ML IJ SOLN
0.5000 mg | Freq: Once | INTRAMUSCULAR | Status: DC | PRN
  Filled 2020-12-07: qty 1

## 2020-12-07 MED ORDER — HEPARIN SODIUM (PORCINE) 5000 UNIT/ML IJ SOLN
5000.0000 [IU] | Freq: Three times a day (TID) | INTRAMUSCULAR | Status: DC
  Administered 2020-12-07 – 2020-12-13 (×18): 5000 [IU] via SUBCUTANEOUS
  Filled 2020-12-07 (×18): qty 1

## 2020-12-07 MED ORDER — OXYCODONE HCL 5 MG PO TABS
5.0000 mg | ORAL_TABLET | ORAL | Status: DC | PRN
  Administered 2020-12-09: 5 mg via ORAL
  Filled 2020-12-07: qty 1

## 2020-12-07 MED ORDER — SODIUM CHLORIDE 0.9 % IV BOLUS
1000.0000 mL | Freq: Once | INTRAVENOUS | Status: AC
Start: 2020-12-07 — End: 2020-12-07
  Administered 2020-12-07: 1000 mL via INTRAVENOUS

## 2020-12-07 MED ORDER — ATORVASTATIN CALCIUM 80 MG PO TABS
80.0000 mg | ORAL_TABLET | Freq: Every day | ORAL | Status: DC
  Administered 2020-12-08 – 2020-12-13 (×6): 80 mg via ORAL
  Filled 2020-12-07 (×6): qty 1

## 2020-12-07 MED ORDER — MORPHINE SULFATE (PF) 2 MG/ML IV SOLN: 2.0000 mg | INTRAVENOUS | Status: DC | PRN

## 2020-12-07 MED ORDER — ASPIRIN EC 81 MG PO TBEC
81.0000 mg | DELAYED_RELEASE_TABLET | Freq: Every day | ORAL | Status: DC
  Administered 2020-12-08 – 2020-12-13 (×6): 81 mg via ORAL
  Filled 2020-12-07 (×6): qty 1

## 2020-12-07 MED ORDER — LOPERAMIDE HCL 2 MG PO CAPS
2.0000 mg | ORAL_CAPSULE | ORAL | Status: DC | PRN
  Administered 2020-12-08 – 2020-12-09 (×2): 2 mg via ORAL
  Filled 2020-12-07 (×2): qty 1

## 2020-12-07 MED ORDER — VANCOMYCIN HCL 125 MG PO CAPS
125.0000 mg | ORAL_CAPSULE | Freq: Four times a day (QID) | ORAL | Status: DC
  Administered 2020-12-08 – 2020-12-13 (×22): 125 mg via ORAL
  Filled 2020-12-07 (×28): qty 1

## 2020-12-07 MED ORDER — LACTATED RINGERS IV SOLN: INTRAVENOUS | Status: DC

## 2020-12-07 MED ORDER — ONDANSETRON HCL 4 MG PO TABS: 4.0000 mg | ORAL_TABLET | Freq: Four times a day (QID) | ORAL | Status: DC | PRN

## 2020-12-07 MED ORDER — METRONIDAZOLE 500 MG/100ML IV SOLN
500.0000 mg | Freq: Three times a day (TID) | INTRAVENOUS | Status: DC
  Administered 2020-12-07 – 2020-12-09 (×6): 500 mg via INTRAVENOUS
  Filled 2020-12-07 (×6): qty 100

## 2020-12-07 MED ORDER — ESCITALOPRAM OXALATE 10 MG PO TABS
10.0000 mg | ORAL_TABLET | Freq: Every day | ORAL | Status: DC
  Administered 2020-12-08 – 2020-12-13 (×6): 10 mg via ORAL
  Filled 2020-12-07 (×6): qty 1

## 2020-12-07 MED ORDER — ACETAMINOPHEN 650 MG RE SUPP: 650.0000 mg | Freq: Four times a day (QID) | RECTAL | Status: DC | PRN

## 2020-12-07 MED ORDER — INSULIN ASPART 100 UNIT/ML IJ SOLN
0.0000 [IU] | Freq: Three times a day (TID) | INTRAMUSCULAR | Status: DC
  Administered 2020-12-08: 2 [IU] via SUBCUTANEOUS
  Administered 2020-12-08 – 2020-12-09 (×2): 1 [IU] via SUBCUTANEOUS
  Administered 2020-12-09: 2 [IU] via SUBCUTANEOUS
  Administered 2020-12-11 – 2020-12-13 (×5): 1 [IU] via SUBCUTANEOUS

## 2020-12-07 MED ORDER — FAMOTIDINE 20 MG PO TABS
20.0000 mg | ORAL_TABLET | Freq: Every day | ORAL | Status: DC
  Administered 2020-12-08 – 2020-12-13 (×6): 20 mg via ORAL
  Filled 2020-12-07 (×6): qty 1

## 2020-12-07 MED ORDER — ACETAMINOPHEN 325 MG PO TABS: 650.0000 mg | ORAL_TABLET | Freq: Four times a day (QID) | ORAL | Status: DC | PRN

## 2020-12-07 MED ORDER — ONDANSETRON HCL 4 MG/2ML IJ SOLN
4.0000 mg | Freq: Four times a day (QID) | INTRAMUSCULAR | Status: DC | PRN
  Administered 2020-12-08: 4 mg via INTRAVENOUS
  Filled 2020-12-07: qty 2

## 2020-12-07 MED ORDER — ENSURE ENLIVE PO LIQD
237.0000 mL | Freq: Three times a day (TID) | ORAL | Status: DC
  Administered 2020-12-07 – 2020-12-13 (×17): 237 mL via ORAL
  Filled 2020-12-07: qty 237

## 2020-12-07 MED ORDER — CLOPIDOGREL BISULFATE 75 MG PO TABS
75.0000 mg | ORAL_TABLET | Freq: Every day | ORAL | Status: DC
  Administered 2020-12-08 – 2020-12-09 (×2): 75 mg via ORAL
  Filled 2020-12-07 (×2): qty 1

## 2020-12-07 NOTE — ED Notes (Signed)
Dr. Clearence Ped notified that the pt is unable to take pills by mouth

## 2020-12-07 NOTE — ED Notes (Signed)
Pt arrived via EMS and had a bowel movement. Provided peri care to pt, changed brief and chux. Pt clean, dry and resting.

## 2020-12-07 NOTE — H&P (Signed)
TRH H&P    Patient Demographics:    Hayden Mckinney, is a 75 y.o. male  MRN: [id number]  DOB - 02-05-1945  Admit Date - 12/07/2020  Referring MD/NP/PA: Billy Fischer  Outpatient Primary MD for the patient is Wenda Low, MD  Patient coming from: [facility]   Chief complaint- altered mental status   HPI:    Hayden Mckinney  is a 75 y.o. male, with history of CVA, dementia, diabetic peripheral neuropathy, alcohol abuse, hyperlipidemia, hypertension, sarcoma, stroke, TIA, type 2 diabetes mellitus presents ED with a chief complaint of altered mental status.  Unfortunately the facility from which patient is from could not be reached.  It is reported that patient has ambulatory baseline, with minimal assistance with ADLs.  EMS reports that patient is totally confused at their arrival.  It is unclear what time the symptoms started.  At arrival into the ED, patient did start to have copious amounts of diarrhea.  On my exam the diarrhea appears green liquid.  Patient was recently discharged from the hospital in October 18, and there was no note of continued antibiotics after that stay.  He had been on doxycycline and Bactrim in the past, those were not continued at discharge.  Is not reported if patient had any fevers at the facility.  He is afebrile upon arrival into the ED.  Unfortunately, no further history could be obtained at this time.  In the ED Temp 97.5, heart rate 57-64, respiratory rate 9-19, blood pressure 153/64, satting at 97-100% on room air VBG shows a PCO2 64, pH 7.395 showing that the CO2 retention is likely baseline Slight leukocytosis with a white blood cell count 11.6, hemoglobin 9.7, and then 10.2-this is patient's baseline Chemistry panel reveals a high normal potassium at 5.0, decreased bicarb 21, baseline creatinine 2.75 Initial troponin 8, repeat pending CT head shows no acute intracranial  pathology.  Moderate age-related atrophy and chronic microvascular ischemic changes.  Old right basal ganglia and right cerebellar infarcts Chest x-ray shows low lung volumes with streaky bibasilar atelectasis GI pathogen panel pending Patient started on Ativan as needed, vancomycin p.o., given a 1 L bolus, blood cultures pending, enteric precautions.  MRI brain pending Admission requested for further work-up of altered mental status  Chart review At last admission patient was admitted for slurred speech, aphasia, generalized weakness. Patient was given TNKase CT head had no acute abnormality.  MRI head no acute infarct.  EEG showed mild diffuse encephalopathy without seizure.  Patient was started on aspirin and Plavix and it was recommended that patient was discharged to SNF. In 2016 patient did have a right frontal acute infarct, right BG/CR subacute infarct in the CTA head showed stenosis with high-grade right A1, M2, PA, SCA, P1 and left P2 stenoses Patient has hypertension and was continued on Coreg and BiDil And discharge physical exam shows patient was awake, alert, oriented to age place and time, no aphasia but severe dysarthria, difficult to understand.  Would follow simple commands.  There was no gaze palsy.  No facial droop, tongue protruded  midline.  Equal strength in the bilateral upper extremities, equal strength in bilateral lower extremities.  Distally there was no movement of the right ankle or foot, left ankle no movement,     Review of systems:    Unfortunately ROS could not be obtained 2/2 altered mental status    Past History of the following :    Past Medical History:  Diagnosis Date   CVA (cerebral vascular accident) ([place])    Dementia ([place])    Diabetic peripheral neuropathy ([place])    a. both feet   ETOH abuse    a. 04/2014 18-24 beers q weekend.   H/O echocardiogram    a. 04/2014 Echo: EF 55-60%, no rwma.   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 04/2014 multiple bateral infarcts, presumed to be embolic;  b. 07/6158 Carotid U/S: 1-39% bilat ICA stenosis.   TIA (transient ischemic attack)    a. 08/2010.   Type II diabetes mellitus ([place])       Past Surgical History:  Procedure Laterality Date   LOOP RECORDER IMPLANT N/A 04/20/2014   Procedure: LOOP RECORDER IMPLANT;  Surgeon: Thompson Grayer, MD;  Location: [facility] CATH LAB;  Service: Cardiovascular;  Laterality: N/A;   repair of R hand after trauma     sarcoma removal     TEE WITHOUT CARDIOVERSION N/A 04/20/2014   Procedure: TRANSESOPHAGEAL ECHOCARDIOGRAM (TEE);  Surgeon: Larey Dresser, MD;  Location: [facility] ENDOSCOPY;  Service: Cardiovascular;  Laterality: N/A;      Social History:      Social History   Tobacco Use   Smoking status: Never   Smokeless tobacco: Never  Substance Use Topics   Alcohol use: Yes    Alcohol/week: 6.0 standard drinks    Types: 6 Standard drinks or equivalent per week    Comment: 18-24 beers every weekend.       Family History :     Family History  Problem Relation Age of Onset   Cancer Mother    Cancer Father    Diabetes Brother    Family history could not be reviewed secondary to altered mental status   Home Medications:   Prior to Admission medications   Medication Sig Start Date End Date Taking? Authorizing Provider  acetaminophen (TYLENOL) 325 MG tablet Take 2 tablets (650 mg total) by mouth every 6 (six) hours as needed for mild pain (or Fever >/= 101). 10/25/20   August Albino, NP  aspirin 81 MG EC tablet Take 1 tablet (81 mg total) by mouth daily. Swallow whole. 10/26/20   August Albino, NP  atorvastatin (LIPITOR) 80 MG tablet Take 1 tablet (80 mg total) by mouth daily at 6 PM. 10/25/20   August Albino, NP  bisacodyl (DULCOLAX) 10 MG suppository Place 1 suppository (10 mg total) rectally daily as needed for moderate constipation. 10/25/20   August Albino, NP  carvedilol (COREG) 6.25 MG tablet Take 1  tablet (6.25 mg total) by mouth 2 (two) times daily with a meal. 10/25/20   August Albino, NP  clopidogrel (PLAVIX) 75 MG tablet Take 1 tablet (75 mg total) by mouth daily. 10/26/20   August Albino, NP  docusate sodium (COLACE) 100 MG capsule Take 1 capsule (100 mg total) by mouth 2 (two) times daily. 10/25/20   August Albino, NP  escitalopram (LEXAPRO) 10 MG tablet Take 1 tablet (10 mg  total) by mouth daily. 10/25/20   August Albino, NP  famotidine (PEPCID) 20 MG tablet Take 1 tablet (20 mg total) by mouth daily. 10/25/20   August Albino, NP  feeding supplement (ENSURE ENLIVE / ENSURE PLUS) LIQD Take 237 mLs by mouth 3 (three) times daily between meals. 10/25/20   August Albino, NP  Ferrous Sulfate (IRON HIGH-POTENCY) 142 (45 Fe) MG TBCR Take 1 tablet by mouth daily. 10/25/20   August Albino, NP  glimepiride (AMARYL) 1 MG tablet Take 1 tablet (1 mg total) by mouth daily with breakfast. 10/25/20   August Albino, NP  isosorbide-hydrALAZINE (BIDIL) 20-37.5 MG tablet Take 1 tablet by mouth 2 (two) times daily. 10/25/20   August Albino, NP  loperamide (IMODIUM) 2 MG capsule Take 1 capsule (2 mg total) by mouth as needed for diarrhea or loose stools. 10/25/20   August Albino, NP  Multiple Vitamin (MULTIVITAMIN WITH MINERALS) TABS tablet Take 1 tablet by mouth daily. 10/26/20   August Albino, NP  NOVOLOG FLEXPEN 100 UNIT/ML FlexPen Inject 2-11 Units into the skin See admin instructions. If blood glucose is 101-150, take 2 units  151-200, take 3 units  201-250, take 5 units  251-300, take 7 units  301-350, take 9 units  350, take 11 units  Call MD if blood glucose is over 400 or under 60 10/25/20   August Albino, NP  ondansetron (ZOFRAN) 4 MG tablet Take 4 mg by mouth every 8 (eight) hours as needed for nausea or vomiting.    [provider]  tamsulosin (FLOMAX) 0.4 MG CAPS capsule Take 1 capsule (0.4 mg total) by mouth daily. 10/25/20   August Albino, NP  vitamin C  (ASCORBIC ACID) 500 MG tablet Take 1 tablet (500 mg total) by mouth 2 (two) times daily. 10/25/20   August Albino, NP  Vitamin D, Ergocalciferol, (DRISDOL) 1.25 MG (50000 UNIT) CAPS capsule Take 1 capsule (50,000 Units total) by mouth every Tuesday. 10/25/20   August Albino, NP     Allergies:    No Known Allergies   Physical Exam:   Vitals  Blood pressure (!) 153/64, pulse 61, temperature (!) 97.5 F (36.4 C), temperature source Oral, resp. rate 12, SpO2 97 %.   1.  General: Patient lying right lateral decubitus no acute distress   2. Psychiatric: Alert and oriented x 0, not currently answering questions or following commands  3. Neurologic: Alert but nonverbal, not following commands, does make eye contact, moves left arm and leg voluntarily, not moving right upper and lower extremity as he is laying on them and not following commands to rollover.  Does appear to respond to pain in right upper and lower extremity with grimace   4. HEENMT:  Head is atraumatic, normocephalic, pupils reactive to light, neck is supple, trachea is midline, mucous membranes are dry  5. Respiratory : Lungs are clear to auscultation bilaterally without wheezing, rhonchi, rales, no cyanosis, no increase in work of breathing or accessory muscle use   6. Cardiovascular : Heart rate normal, rhythm is regular, no murmurs, rubs or gallops, no peripheral edema, peripheral pulses palpated   7. Gastrointestinal:  Abdomen is soft, nondistended, nontender to palpation bowel sounds active, no masses or organomegaly palpated   8. Skin:  Skin is warm, dry and intact without rashes, acute lesions, there are clean dry bandages over left ankle   9.Musculoskeletal:  No acute deformities or trauma, no asymmetry in tone,  no peripheral edema, peripheral pulses palpated, no tenderness to palpation in the extremities     Data Review:    CBC Recent Labs  Lab 12/07/20 1412 12/07/20 1442  WBC 11.6*  --   HGB  9.7* 10.2*  HCT 32.2* 30.0*  PLT 227  --   MCV 92.3  --   MCH 27.8  --   MCHC 30.1  --   RDW 13.5  --   LYMPHSABS 1.8  --   MONOABS 0.8  --   EOSABS 0.1  --   BASOSABS 0.1  --    ------------------------------------------------------------------------------------------------------------------  Results for orders placed or performed during the hospital encounter of 12/07/20 (from the past 48 hour(s))  CBC with Differential     Status: Abnormal   Collection Time: 12/07/20  2:12 PM  Result Value Ref Range   WBC 11.6 (H) 4.0 - 10.5 K/uL   RBC 3.49 (L) 4.22 - 5.81 MIL/uL   Hemoglobin 9.7 (L) 13.0 - 17.0 g/dL   HCT 32.2 (L) 39.0 - 52.0 %   MCV 92.3 80.0 - 100.0 fL   MCH 27.8 26.0 - 34.0 pg   MCHC 30.1 30.0 - 36.0 g/dL   RDW 13.5 11.5 - 15.5 %   Platelets 227 150 - 400 K/uL   nRBC 0.0 0.0 - 0.2 %   Neutrophils Relative % 76 %   Neutro Abs 8.8 (H) 1.7 - 7.7 K/uL   Lymphocytes Relative 15 %   Lymphs Abs 1.8 0.7 - 4.0 K/uL   Monocytes Relative 7 %   Monocytes Absolute 0.8 0.1 - 1.0 K/uL   Eosinophils Relative 1 %   Eosinophils Absolute 0.1 0.0 - 0.5 K/uL   Basophils Relative 0 %   Basophils Absolute 0.1 0.0 - 0.1 K/uL   Immature Granulocytes 1 %   Abs Immature Granulocytes 0.06 0.00 - 0.07 K/uL    Comment: Performed at [facility], [address] [place], [place] [address]  Comprehensive metabolic panel     Status: Abnormal   Collection Time: 12/07/20  2:12 PM  Result Value Ref Range   Sodium 140 135 - 145 mmol/L   Potassium 5.0 3.5 - 5.1 mmol/L   Chloride 113 (H) 98 - 111 mmol/L   CO2 21 (L) 22 - 32 mmol/L   Glucose, Bld 89 70 - 99 mg/dL    Comment: Glucose reference range applies only to samples taken after fasting for at least 8 hours.   BUN 25 (H) 8 - 23 mg/dL   Creatinine, Ser 2.75 (H) 0.61 - 1.24 mg/dL   Calcium 8.0 (L) 8.9 - 10.3 mg/dL   Total Protein 5.0 (L) 6.5 - 8.1 g/dL   Albumin 1.9 (L) 3.5 - 5.0 g/dL   AST 14 (L) 15 - 41 U/L   ALT 8 0 - 44 U/L    Alkaline Phosphatase 70 38 - 126 U/L   Total Bilirubin 0.4 0.3 - 1.2 mg/dL   GFR, Estimated 23 (L) >60 mL/min    Comment: (NOTE) Calculated using the CKD-EPI Creatinine Equation (2021)    Anion gap 6 5 - 15    Comment: Performed at [facility], [place] [address] [place], [place] [address]  Lipase, blood     Status: None   Collection Time: 12/07/20  2:12 PM  Result Value Ref Range   Lipase 21 11 - 51 U/L    Comment: Performed at [facility] [address] [place], [place] [address]  Urinalysis, Routine w reflex microscopic     Status: Abnormal   Collection Time: 12/07/20  2:15 PM  Result Value Ref Range   Color, Urine YELLOW YELLOW   APPearance CLEAR CLEAR   Specific Gravity, Urine 1.025 1.005 - 1.030   pH 6.0 5.0 - 8.0   Glucose, UA 100 (A) NEGATIVE mg/dL   Hgb urine dipstick SMALL (A) NEGATIVE   Bilirubin Urine NEGATIVE NEGATIVE   Ketones, ur NEGATIVE NEGATIVE mg/dL   Protein, ur >300 (A) NEGATIVE mg/dL   Nitrite NEGATIVE NEGATIVE   Leukocytes,Ua NEGATIVE NEGATIVE    Comment: Performed at [facility] [address], [place], [place] [address]  Ammonia     Status: None   Collection Time: 12/07/20  2:15 PM  Result Value Ref Range   Ammonia 20 9 - 35 umol/L    Comment: Performed at [facility], [place] [address], [place], [place] [address]  Lactic acid, plasma     Status: None   Collection Time: 12/07/20  2:15 PM  Result Value Ref Range   Lactic Acid, Venous 1.3 0.5 - 1.9 mmol/L    Comment: Performed at [facility] [address] [place], [place] [address]  Urinalysis, Microscopic (reflex)     Status: Abnormal   Collection Time: 12/07/20  2:15 PM  Result Value Ref Range   RBC / HPF 0-5 0 - 5 RBC/hpf   WBC, UA 0-5 0 - 5 WBC/hpf   Bacteria, UA RARE (A) NONE SEEN   Squamous Epithelial / LPF NONE SEEN 0 - 5   Mucus PRESENT    Hyaline Casts, UA PRESENT     Comment: Performed at [facility], [place] [address] [place], [place] [address]   CBG monitoring, ED     Status: None   Collection Time: 12/07/20  2:23 PM  Result Value Ref Range   Glucose-Capillary 91 70 - 99 mg/dL    Comment: Glucose reference range applies only to samples taken after fasting for at least 8 hours.  Resp Panel by RT-PCR (Flu A&B, Covid)     Status: None   Collection Time: 12/07/20  2:34 PM   Specimen: Nasopharyngeal(NP) swabs in vial transport medium  Result Value Ref Range   SARS Coronavirus 2 by RT PCR NEGATIVE NEGATIVE    Comment: (NOTE) SARS-CoV-2 target nucleic acids are NOT DETECTED.  The SARS-CoV-2 RNA is generally detectable in upper respiratory specimens during the acute phase of infection. The lowest concentration of SARS-CoV-2 viral copies this assay can detect is 138 copies/mL. A negative result does not preclude SARS-Cov-2 infection and should not be used as the sole basis for treatment or other patient management decisions. A negative result may occur with  improper specimen collection/handling, submission of specimen other than nasopharyngeal swab, presence of viral mutation(s) within the areas targeted by this assay, and inadequate number of viral copies(<138 copies/mL). A negative result must be combined with clinical observations, patient history, and epidemiological information. The expected result is Negative.  Fact Sheet for Patients:  EntrepreneurPulse.com.au  Fact Sheet for Healthcare Providers:  IncredibleEmployment.be  This test is no t yet approved or cleared by the [place] FDA and  has been authorized for detection and/or diagnosis of SARS-CoV-2 by FDA under an Emergency Use Authorization (EUA). This EUA will remain  in effect (meaning this test can be used) for the duration of the COVID-19 declaration under Section 564(b)(1) of the Act, 21 U.S.C.section 360bbb-3(b)(1), unless the authorization  is terminated  or revoked sooner.       Influenza A by PCR NEGATIVE  NEGATIVE   Influenza B by PCR NEGATIVE NEGATIVE    Comment: (NOTE) The Xpert Xpress SARS-CoV-2/FLU/RSV plus assay is intended as an aid in the diagnosis of influenza from Nasopharyngeal swab specimens and should not be used as a sole basis for treatment. Nasal washings and aspirates are unacceptable for Xpert Xpress SARS-CoV-2/FLU/RSV testing.  Fact Sheet for Patients: EntrepreneurPulse.com.au  Fact Sheet for Healthcare Providers: IncredibleEmployment.be  This test is not yet approved or cleared by the [place] FDA and has been authorized for detection and/or diagnosis of SARS-CoV-2 by FDA under an Emergency Use Authorization (EUA). This EUA will remain in effect (meaning this test can be used) for the duration of the COVID-19 declaration under Section 564(b)(1) of the Act, 21 U.S.C. section 360bbb-3(b)(1), unless the authorization is terminated or revoked.  Performed at [facility], [place] [address] [place], [place] [address]   I-Stat venous blood gas, [facility] ED)     Status: Abnormal   Collection Time: 12/07/20  2:42 PM  Result Value Ref Range   pH, Ven 7.395 7.250 - 7.430   pCO2, Ven 33.5 (L) 44.0 - 60.0 mmHg   pO2, Ven 64.0 (H) 32.0 - 45.0 mmHg   Bicarbonate 20.5 20.0 - 28.0 mmol/L   TCO2 22 22 - 32 mmol/L   O2 Saturation 92.0 %   Acid-base deficit 4.0 (H) 0.0 - 2.0 mmol/L   Sodium 141 135 - 145 mmol/L   Potassium 4.9 3.5 - 5.1 mmol/L   Calcium, Ion 1.14 (L) 1.15 - 1.40 mmol/L   HCT 30.0 (L) 39.0 - 52.0 %   Hemoglobin 10.2 (L) 13.0 - 17.0 g/dL   Sample type VENOUS   C Difficile Quick Screen w PCR reflex     Status: Abnormal   Collection Time: 12/07/20  4:23 PM   Specimen: STOOL  Result Value Ref Range   C Diff antigen POSITIVE (A) NEGATIVE    Comment: CRITICAL RESULT CALLED TO, READ BACK BY AND VERIFIED WITH: CONOR LOZER RN 12/07/2020 @1755  BY JW    C Diff toxin NEGATIVE NEGATIVE   C Diff interpretation Results are  indeterminate. See PCR results.     Comment: Performed at [facility], [place] [address], [place], [place] [address]  C. Diff by PCR, Reflexed     Status: Abnormal   Collection Time: 12/07/20  4:23 PM  Result Value Ref Range   Toxigenic C. Difficile by PCR POSITIVE (A) NEGATIVE    Comment: Positive for toxigenic C. difficile with little to no toxin production. Only treat if clinical presentation suggests symptomatic illness. Performed at [facility], [place] [address], [place], [place] [address]   Troponin I (High Sensitivity)     Status: None   Collection Time: 12/07/20  5:34 PM  Result Value Ref Range   Troponin I (High Sensitivity) 8 <18 ng/L    Comment: (NOTE) Elevated high sensitivity troponin I (hsTnI) values and significant  changes across serial measurements may suggest ACS but many other  chronic and acute conditions are known to elevate hsTnI results.  Refer to the "Links" section for chest pain algorithms and additional  guidance. Performed at [facility], [place] [address], [place],  [address]     Chemistries  Recent Labs  Lab 12/07/20 1412 12/07/20 1442  NA 140 141  K 5.0 4.9  CL 113*  --  CO2 21*  --   GLUCOSE 89  --   BUN 25*  --   CREATININE 2.75*  --   CALCIUM 8.0*  --   AST 14*  --   ALT 8  --   ALKPHOS 70  --   BILITOT 0.4  --    ------------------------------------------------------------------------------------------------------------------  ------------------------------------------------------------------------------------------------------------------ GFR: CrCl cannot be calculated (Unknown ideal weight.). Liver Function Tests: Recent Labs  Lab 12/07/20 1412  AST 14*  ALT 8  ALKPHOS 70  BILITOT 0.4  PROT 5.0*  ALBUMIN 1.9*   Recent Labs  Lab 12/07/20 1412  LIPASE 21   Recent Labs  Lab 12/07/20 1415  AMMONIA 20   Coagulation Profile: No results for input(s): INR, PROTIME in the last 168 hours. Cardiac  Enzymes: No results for input(s): CKTOTAL, CKMB, CKMBINDEX, TROPONINI in the last 168 hours. BNP (last 3 results) No results for input(s): PROBNP in the last 8760 hours. HbA1C: No results for input(s): HGBA1C in the last 72 hours. CBG: Recent Labs  Lab 12/07/20 1423  GLUCAP 91   Lipid Profile: No results for input(s): CHOL, HDL, LDLCALC, TRIG, CHOLHDL, LDLDIRECT in the last 72 hours. Thyroid Function Tests: No results for input(s): TSH, T4TOTAL, FREET4, T3FREE, THYROIDAB in the last 72 hours. Anemia Panel: No results for input(s): VITAMINB12, FOLATE, FERRITIN, TIBC, IRON, RETICCTPCT in the last 72 hours.  --------------------------------------------------------------------------------------------------------------- Urine analysis:    Component Value Date/Time   COLORURINE YELLOW 12/07/2020 1415   APPEARANCEUR CLEAR 12/07/2020 1415   LABSPEC 1.025 12/07/2020 1415   PHURINE 6.0 12/07/2020 1415   GLUCOSEU 100 (A) 12/07/2020 1415   HGBUR SMALL (A) 12/07/2020 1415   BILIRUBINUR NEGATIVE 12/07/2020 1415   KETONESUR NEGATIVE 12/07/2020 1415   PROTEINUR >300 (A) 12/07/2020 1415   UROBILINOGEN 0.2 07/08/2014 0544   NITRITE NEGATIVE 12/07/2020 1415   LEUKOCYTESUR NEGATIVE 12/07/2020 1415      Imaging Results:    DG Chest 2 View  Result Date: 12/07/2020 CLINICAL DATA:  Altered mental status and weakness. EXAM: CHEST - 2 VIEW COMPARISON:  07/11/2020. FINDINGS: The cardiac silhouette, mediastinal and hilar contours are within normal limits and stable. Low lung volumes with streaky bibasilar atelectasis. No definite infiltrates or effusions. IMPRESSION: Low lung volumes with streaky bibasilar atelectasis. Electronically Signed   By: Marijo Sanes M.D.   On: 12/07/2020 15:10   CT HEAD WO CONTRAST (5MM)  Result Date: 12/07/2020 CLINICAL DATA:  Delirium. EXAM: CT HEAD WITHOUT CONTRAST TECHNIQUE: Contiguous axial images were obtained from the base of the skull through the vertex without  intravenous contrast. COMPARISON:  Head CT dated 10/23/2020. FINDINGS: Brain: Moderate age-related atrophy and chronic microvascular ischemic changes. Right basal ganglia and right cerebellar old infarcts. There is no acute intracranial hemorrhage. No mass effect or midline shift. No extra-axial fluid collection. Vascular: No hyperdense vessel or unexpected calcification. Skull: Normal. Negative for fracture or focal lesion. Sinuses/Orbits: A 2.5 cm right maxillary sinus retention cyst or polyp. The remainder of the visualized paranasal sinuses and mastoid air cells are clear. Other: None IMPRESSION: 1. No acute intracranial pathology. 2. Moderate age-related atrophy and chronic microvascular ischemic changes. Old right basal ganglia and right cerebellar infarcts. Electronically Signed   By: Anner Crete M.D.   On: 12/07/2020 17:41       Assessment & Plan:    Principal Problem:   Acute metabolic encephalopathy Active Problems:   Type II diabetes mellitus with neurological manifestations (HCC)   HTN (hypertension)   Hyperlipidemia   Severe protein-calorie  malnutrition [name]: less than 60% of standard weight) ([place])   Diarrhea   Acute metabolic encephalopathy Baseline is ambulatory and minimal help with ADLs Chart review reveals speech abnormality at baseline CT head today has no acute infarct, MRI pending TSH pending Ammonia 20 BUN and creatinine at baseline Electrolytes show a hypocalcemia that corrects for albumin VBG shows a PCO2 of 64, but a normal pH at 7.395 indicating this is likely baseline Initial troponin 8 Patient likely does have a GI infection with green watery diarrhea.  Started on vancomycin.  GI pathogen panel pending.  Enteric precautions continue UA does not show UTI Chest x-ray does not show an etiology that would explain the symptoms Patient does have severe protein calorie malnutrition and diet supplement will be continued Euglycemic with a glucose of 89 EEG  pending Continue to monitor Green watery diarrhea Patient is not able to answer if he is been on any recent antibiotics No recent antibiotics and chart review No evidence of abdominal tenderness Abdomen is slightly distended, green watery diarrhea, GI pathogens pending Continue p.o. vancomycin Continue to monitor Type 2 diabetes mellitus Sliding scale coverage History of stroke Continue aspirin, statin, Plavix Hypertension MRI pending, considering patient's altered mental status we will continue permissive hypertension until MRI results Severe protein calorie malnutrition Continue protein shakes    DVT Prophylaxis-   Heparin - SCDs   AM Labs Ordered, also please review Full Orders  Family Communication: Unable to reach SNF, no family at bedside  Code Status: Full  Admission status: Inpatient :The appropriate admission status for this patient is INPATIENT. Inpatient status is judged to be reasonable and necessary in order to provide the required intensity of service to ensure the patient's safety. The patient's presenting symptoms, physical exam findings, and initial radiographic and laboratory data in the context of their chronic comorbidities is felt to place them at high risk for further clinical deterioration. Furthermore, it is not anticipated that the patient will be medically stable for discharge from the hospital within 2 midnights of admission. The following factors support the admission status of inpatient.     The patient's presenting symptoms include altered mental status. The worrisome physical exam findings include encephalopathic. The chronic co-morbidities include hypertension, hyperlipidemia, history of stroke, diabetes mellitus type 2.       * I certify that at the point of admission it is my clinical judgment that the patient will require inpatient hospital care spanning beyond 2 midnights from the point of admission due to high intensity of service, high risk  for further deterioration and high frequency of surveillance required.*  Disposition: Anticipated Discharge date 72 hours Discharge to SNF  Time spent in minutes : [place]

## 2020-12-07 NOTE — ED Notes (Signed)
This RN attempted 2 NG tubes on this pt with the assistance of another RN and an EMT. Pt was uncooperative and when the NG tube was pulled out blood was noted on the tube. Admitting notified

## 2020-12-07 NOTE — ED Triage Notes (Signed)
Arrives via [facility] from [place]. Pt baseline- ambulatory, minimal help with ADLs. Staff reports AMS, weak, not following commands. Several wounds on feet. Mucous rich stool found on arrival.

## 2020-12-07 NOTE — ED Provider Notes (Signed)
[facility] EMERGENCY DEPARTMENT Provider Note   CSN: [id number] Arrival date & time: 12/07/20  1358    History Chief Complaint  Patient presents with   Altered Mental Status    Hayden Mckinney is a 75 y.o. male with past medical history significant for CVA, dementia, hypertension who presents for evaluation of altered mental status.  Unclear exact timing of when this started. Patient's baseline is ambulatory, minimal help with ADLs.  Patient has been very weak, confused, not following commands.  Having copious loose stools.  Left midline IV however no abx in [facility] listed?  Level 5 caveat-altered mental status  1430: Attempt to obtain collateral from [facility] patient's living facility however unsuccessful  Not a Code stroke due to unknown LKN, no obvious deficits on exam. LVO neg  HPI     Past Medical History:  Diagnosis Date   CVA (cerebral vascular accident) ([place])    Dementia ([place])    Diabetic peripheral neuropathy ([place])    a. both feet   ETOH abuse    a. 04/2014 18-24 beers q weekend.   H/O echocardiogram    a. 04/2014 Echo: EF 55-60%, no rwma.   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 04/2014 multiple bateral infarcts, presumed to be embolic;  b. 07/8293 Carotid U/S: 1-39% bilat ICA stenosis.   TIA (transient ischemic attack)    a. 08/2010.   Type II diabetes mellitus [facility])     Patient Active Problem List   Diagnosis Date Noted   Encephalopathy 10/25/2020   Malnutrition of moderate degree 10/25/2020   Cerebral thrombosis with cerebral infarction ([place]) 07/07/2014   Acute CVA (cerebrovascular accident) ([place]) 07/07/2014   Altered mental status 07/06/2014   Syncopal episodes 07/06/2014   Left sided abdominal pain 07/06/2014   Syncope and collapse 07/06/2014   Cerebellar infarct ([place]) 04/21/2014   Aspiration pneumonia ([place]) 04/20/2014   Acute kidney injury ([place]) 04/20/2014   Speech abnormality     Embolic stroke ([place]) 62/13/0865   Type II diabetes mellitus with neurological manifestations ([place]) 08/25/2010   HTN (hypertension) 08/25/2010   Hyperlipidemia 08/25/2010    Past Surgical History:  Procedure Laterality Date   LOOP RECORDER IMPLANT N/A 04/20/2014   Procedure: LOOP RECORDER IMPLANT;  Surgeon: Thompson Grayer, MD;  Location: [facility] CATH LAB;  Service: Cardiovascular;  Laterality: N/A;   repair of R hand after trauma     sarcoma removal     TEE WITHOUT CARDIOVERSION N/A 04/20/2014   Procedure: TRANSESOPHAGEAL ECHOCARDIOGRAM (TEE);  Surgeon: Larey Dresser, MD;  Location: [facility] ENDOSCOPY;  Service: Cardiovascular;  Laterality: N/A;       Family History  Problem Relation Age of Onset   Cancer Mother    Cancer Father    Diabetes Brother     Social History   Tobacco Use   Smoking status: Never   Smokeless tobacco: Never  Substance Use Topics   Alcohol use: Yes    Alcohol/week: 6.0 standard drinks    Types: 6 Standard drinks or equivalent per week    Comment: 18-24 beers every weekend.   Drug use: No    Home Medications Prior to Admission medications   Medication Sig Start Date End Date Taking? Authorizing Provider  acetaminophen (TYLENOL) 325 MG tablet Take 2 tablets (650 mg total) by mouth every 6 (six) hours as needed for mild pain (or Fever >/= 101). 10/25/20  August Albino, NP  aspirin 81 MG EC tablet Take 1 tablet (81 mg total) by mouth daily. Swallow whole. 10/26/20   August Albino, NP  atorvastatin (LIPITOR) 80 MG tablet Take 1 tablet (80 mg total) by mouth daily at 6 PM. 10/25/20   August Albino, NP  bisacodyl (DULCOLAX) 10 MG suppository Place 1 suppository (10 mg total) rectally daily as needed for moderate constipation. 10/25/20   August Albino, NP  carvedilol (COREG) 6.25 MG tablet Take 1 tablet (6.25 mg total) by mouth 2 (two) times daily with a meal. 10/25/20   August Albino, NP  clopidogrel (PLAVIX) 75 MG tablet Take 1 tablet (75 mg total) by mouth  daily. 10/26/20   August Albino, NP  docusate sodium (COLACE) 100 MG capsule Take 1 capsule (100 mg total) by mouth 2 (two) times daily. 10/25/20   August Albino, NP  escitalopram (LEXAPRO) 10 MG tablet Take 1 tablet (10 mg total) by mouth daily. 10/25/20   August Albino, NP  famotidine (PEPCID) 20 MG tablet Take 1 tablet (20 mg total) by mouth daily. 10/25/20   August Albino, NP  feeding supplement (ENSURE ENLIVE / ENSURE PLUS) LIQD Take 237 mLs by mouth 3 (three) times daily between meals. 10/25/20   August Albino, NP  Ferrous Sulfate (IRON HIGH-POTENCY) 142 (45 Fe) MG TBCR Take 1 tablet by mouth daily. 10/25/20   August Albino, NP  glimepiride (AMARYL) 1 MG tablet Take 1 tablet (1 mg total) by mouth daily with breakfast. 10/25/20   August Albino, NP  isosorbide-hydrALAZINE (BIDIL) 20-37.5 MG tablet Take 1 tablet by mouth 2 (two) times daily. 10/25/20   August Albino, NP  loperamide (IMODIUM) 2 MG capsule Take 1 capsule (2 mg total) by mouth as needed for diarrhea or loose stools. 10/25/20   August Albino, NP  Multiple Vitamin (MULTIVITAMIN WITH MINERALS) TABS tablet Take 1 tablet by mouth daily. 10/26/20   August Albino, NP  NOVOLOG FLEXPEN 100 UNIT/ML FlexPen Inject 2-11 Units into the skin See admin instructions. If blood glucose is 101-150, take 2 units  151-200, take 3 units  201-250, take 5 units  251-300, take 7 units  301-350, take 9 units  350, take 11 units  Call MD if blood glucose is over 400 or under 60 10/25/20   August Albino, NP  ondansetron (ZOFRAN) 4 MG tablet Take 4 mg by mouth every 8 (eight) hours as needed for nausea or vomiting.    [provider]  tamsulosin (FLOMAX) 0.4 MG CAPS capsule Take 1 capsule (0.4 mg total) by mouth daily. 10/25/20   August Albino, NP  vitamin C (ASCORBIC ACID) 500 MG tablet Take 1 tablet (500 mg total) by mouth 2 (two) times daily. 10/25/20   August Albino, NP  Vitamin D, Ergocalciferol, (DRISDOL) 1.25 MG  (50000 UNIT) CAPS capsule Take 1 capsule (50,000 Units total) by mouth every Tuesday. 10/25/20   August Albino, NP    Allergies    Patient has no known allergies.  Review of Systems   Review of Systems  Unable to perform ROS: Mental status change  All other systems reviewed and are negative.  Physical Exam Updated Vital Signs BP (!) 114/54   Pulse (!) 59   Temp (!) 97.5 F (36.4 C) (Oral)   Resp 10   SpO2 98%   Physical Exam Vitals and nursing note reviewed.  Constitutional:  General: He is not in acute distress.    Appearance: He is well-developed. He is ill-appearing. He is not diaphoretic.  HENT:     Head: Normocephalic and atraumatic.     Nose: Nose normal.     Mouth/Throat:     Mouth: Mucous membranes are dry.  Eyes:     Pupils: Pupils are equal, round, and reactive to light.  Cardiovascular:     Rate and Rhythm: Normal rate and regular rhythm.     Pulses:          Radial pulses are 1+ on the right side and 1+ on the left side.       Dorsalis pedis pulses are 1+ on the right side and 1+ on the left side.     Heart sounds: Normal heart sounds.  Pulmonary:     Effort: Pulmonary effort is normal. No respiratory distress.     Breath sounds: Normal breath sounds.     Comments: Course lung sounds at bases Abdominal:     General: Bowel sounds are normal. There is no distension.     Palpations: Abdomen is soft.     Tenderness: There is no abdominal tenderness. There is no guarding or rebound.     Comments: Soft non tender  Musculoskeletal:        General: Normal range of motion.     Cervical back: Normal range of motion and neck supple.     Comments: Spontaneously moves all 4 extremities   Skin:    General: Skin is warm and dry.     Comments: Various wounds to lower extremities, no surrounding erythema or warmth.  Neurological:     Mental Status: He is confused.     Comments: Confused, does not follow commands Moves bilateral extremities and withdraws to  pain bilaterally No obvious facial droop Eyes cross at midline    ED Results / Procedures / Treatments   Labs (all labs ordered are listed, but only abnormal results are displayed) Labs Reviewed  CBC WITH DIFFERENTIAL/PLATELET - Abnormal; Notable for the following components:      Result Value   WBC 11.6 (*)    RBC 3.49 (*)    Hemoglobin 9.7 (*)    HCT 32.2 (*)    Neutro Abs 8.8 (*)    All other components within normal limits  I-STAT VENOUS BLOOD GAS, ED - Abnormal; Notable for the following components:   pCO2, Ven 33.5 (*)    pO2, Ven 64.0 (*)    Acid-base deficit 4.0 (*)    Calcium, Ion 1.14 (*)    HCT 30.0 (*)    Hemoglobin 10.2 (*)    All other components within normal limits  RESP PANEL BY RT-PCR (FLU A&B, COVID) ARPGX2  CULTURE, BLOOD (ROUTINE X 2)  CULTURE, BLOOD (ROUTINE X 2)  COMPREHENSIVE METABOLIC PANEL  LIPASE, BLOOD  URINALYSIS, ROUTINE W REFLEX MICROSCOPIC  AMMONIA  LACTIC ACID, PLASMA  LACTIC ACID, PLASMA  CBG MONITORING, ED  TROPONIN I (HIGH SENSITIVITY)    EKG None  Radiology DG Chest 2 View  Result Date: 12/07/2020 CLINICAL DATA:  Altered mental status and weakness. EXAM: CHEST - 2 VIEW COMPARISON:  07/11/2020. FINDINGS: The cardiac silhouette, mediastinal and hilar contours are within normal limits and stable. Low lung volumes with streaky bibasilar atelectasis. No definite infiltrates or effusions. IMPRESSION: Low lung volumes with streaky bibasilar atelectasis. Electronically Signed   By: Marijo Sanes M.D.   On: 12/07/2020 15:10    Procedures Procedures  Medications Ordered in ED Medications  sodium chloride 0.9 % bolus 1,000 mL (1,000 mLs Intravenous New Bag/Given 12/07/20 1512)   ED Course  I have reviewed the triage vital signs and the nursing notes.  Pertinent labs & imaging results that were available during my care of the patient were reviewed by me and considered in my medical decision making (see chart for  details).  75 year old here for evaluation of altered mental status.  He is afebrile however does appear ill.  Stable vital signs.  Does not follow commands here however no obvious unilateral neglect.  He has no obvious facial droop.  Suspect more encephalopathy versus CVA however will obtain head CT.  Plan on broad work-up, suspect patient will need admission  Work up personally reviewed and interpreted:  CBC leukocytosis at 11.6, hemoglobin 9.7 similar to prior DG chest with atelectasis    Care to oncoming PA who will follow up on remaining work-up, suspect patient will need admission  Clinical Course as of 12/07/20 1523  Wed Dec 07, 2020  1509 AMS workup admitted I&O. Call facility [facility] Loose stools, cdiff? Midline IV from home unsure why [SS]    Clinical Course User Index [SS] Smoot, Ignacia Palma   MDM Rules/Calculators/A&P                            Final Clinical Impression(s) / ED Diagnoses Final diagnoses:  Altered mental status, unspecified altered mental status type    Rx / DC Orders ED Discharge Orders     None        Rupa Lagan A, PA-C 12/07/20 [place], [place], DO 12/09/20 1343

## 2020-12-08 ENCOUNTER — Inpatient Hospital Stay (HOSPITAL_COMMUNITY): Payer: Medicare Other

## 2020-12-08 DIAGNOSIS — R4182 Altered mental status, unspecified: Secondary | ICD-10-CM

## 2020-12-08 DIAGNOSIS — G9341 Metabolic encephalopathy: Secondary | ICD-10-CM | POA: Diagnosis not present

## 2020-12-08 DIAGNOSIS — I639 Cerebral infarction, unspecified: Secondary | ICD-10-CM

## 2020-12-08 DIAGNOSIS — E43 Unspecified severe protein-calorie malnutrition: Secondary | ICD-10-CM

## 2020-12-08 DIAGNOSIS — A09 Infectious gastroenteritis and colitis, unspecified: Secondary | ICD-10-CM

## 2020-12-08 LAB — GLUCOSE, CAPILLARY
Glucose-Capillary: 124 mg/dL — ABNORMAL HIGH (ref 70–99)
Glucose-Capillary: 160 mg/dL — ABNORMAL HIGH (ref 70–99)

## 2020-12-08 LAB — GASTROINTESTINAL PANEL BY PCR, STOOL (REPLACES STOOL CULTURE)

## 2020-12-08 LAB — CBG MONITORING, ED
Glucose-Capillary: 131 mg/dL — ABNORMAL HIGH (ref 70–99)
Glucose-Capillary: 160 mg/dL — ABNORMAL HIGH (ref 70–99)

## 2020-12-08 LAB — MAGNESIUM: Magnesium: 1.4 mg/dL — ABNORMAL LOW (ref 1.7–2.4)

## 2020-12-08 LAB — CBC
HCT: 32.7 % — ABNORMAL LOW (ref 39.0–52.0)
Hemoglobin: 10 g/dL — ABNORMAL LOW (ref 13.0–17.0)
MCH: 28.3 pg (ref 26.0–34.0)
MCHC: 30.6 g/dL (ref 30.0–36.0)
MCV: 92.6 fL (ref 80.0–100.0)
Platelets: 218 10*3/uL (ref 150–400)
RBC: 3.53 MIL/uL — ABNORMAL LOW (ref 4.22–5.81)
RDW: 13.3 % (ref 11.5–15.5)
WBC: 13.4 10*3/uL — ABNORMAL HIGH (ref 4.0–10.5)
nRBC: 0 % (ref 0.0–0.2)

## 2020-12-08 LAB — COMPREHENSIVE METABOLIC PANEL
ALT: 9 U/L (ref 0–44)
AST: 17 U/L (ref 15–41)
Albumin: 2.1 g/dL — ABNORMAL LOW (ref 3.5–5.0)
Alkaline Phosphatase: 67 U/L (ref 38–126)
Anion gap: 14 (ref 5–15)
BUN: 30 mg/dL — ABNORMAL HIGH (ref 8–23)
CO2: 17 mmol/L — ABNORMAL LOW (ref 22–32)
Calcium: 8 mg/dL — ABNORMAL LOW (ref 8.9–10.3)
Chloride: 111 mmol/L (ref 98–111)
Creatinine, Ser: 3.17 mg/dL — ABNORMAL HIGH (ref 0.61–1.24)
GFR, Estimated: 20 mL/min — ABNORMAL LOW (ref >60)
Glucose, Bld: 131 mg/dL — ABNORMAL HIGH (ref 70–99)
Potassium: 4.6 mmol/L (ref 3.5–5.1)
Sodium: 142 mmol/L (ref 135–145)
Total Bilirubin: 1 mg/dL (ref 0.3–1.2)
Total Protein: 5.1 g/dL — ABNORMAL LOW (ref 6.5–8.1)

## 2020-12-08 LAB — TSH: TSH: 10.261 u[IU]/mL — ABNORMAL HIGH (ref 0.350–4.500)

## 2020-12-08 MED ORDER — STROKE: EARLY STAGES OF RECOVERY BOOK: Freq: Once | Status: DC

## 2020-12-08 MED ORDER — CHLORHEXIDINE GLUCONATE CLOTH 2 % EX PADS
6.0000 | MEDICATED_PAD | Freq: Every day | CUTANEOUS | Status: DC
  Administered 2020-12-08 – 2020-12-13 (×6): 6 via TOPICAL

## 2020-12-08 NOTE — ED Notes (Signed)
Pt found with watery vomit with secretions mixed in on the floor next to the bed. Pt answered yes when asked if he vomited after coughing.

## 2020-12-08 NOTE — Progress Notes (Signed)
EEG complete - results pending 

## 2020-12-08 NOTE — Consult Note (Signed)
NEURO HOSPITALIST CONSULT NOTE   Requestig physician: Dr. Waldron Labs  Reason for Consult: Acute encephalopathy  History obtained from:  Chart review, limited history obtained from patient due to patient's mental status with confusion  HPI:                                                                                                                                          Hayden Mckinney is a 75 y.o. 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 Unfortunately, the facility that he came from has been unable to be reached for a better understanding of patient's baseline status though documentation supports that he is ambulatory at baseline with minimal assistance with activities of daily living. On arrival, EMS noted that the patient was confused without a clear last known well time established. Work up in the ED revealed patient with copious amounts of green, liquid diarrhea without evidence of recent antibiotics or antibiotics at discharge in October. A CTH was obtained without acute intracranial abnormality with moderate age-related atrophy and chronic microvascular ischemic changes. Patient was admitted for further evaluation of acute encephalopathy and an MRI was obtained revealing a 6 mm acute infarct within the medial left midbrain and a 7 mm acute infarct within the left pons.   Past Medical History:  Diagnosis Date   CVA (cerebral vascular accident) ([place])    Dementia ([place])    Diabetic peripheral neuropathy ([place])    a. both feet   ETOH abuse    a. 04/2014 18-24 beers q weekend.   H/O echocardiogram    a. 04/2014 Echo: EF 55-60%, no rwma.   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 04/2014 multiple bateral infarcts, presumed to be embolic;  b. 05/1759 Carotid U/S: 1-39% bilat ICA stenosis.   TIA (transient ischemic attack)    a. 08/2010.   Type II diabetes mellitus ([place])    Past Surgical History:  Procedure Laterality Date   LOOP RECORDER IMPLANT N/A 04/20/2014   Procedure: LOOP RECORDER IMPLANT;  Surgeon: Thompson Grayer, MD;  Location: [facility] CATH LAB;  Service: Cardiovascular;  Laterality: N/A;   repair of R hand after trauma     sarcoma removal     TEE WITHOUT CARDIOVERSION N/A 04/20/2014   Procedure: TRANSESOPHAGEAL ECHOCARDIOGRAM (TEE);  Surgeon: Larey Dresser, MD;  Location: [facility] ENDOSCOPY;  Service: Cardiovascular;  Laterality: N/A;   Family History  Problem Relation Age of Onset   Cancer Mother  Cancer Father    Diabetes Brother              Social History:  reports that he has never smoked. He has never used smokeless tobacco. He reports current alcohol use of about 6.0 standard drinks per week. He reports that he does not use drugs.  No Known Allergies  MEDICATIONS:                                                                                                                     I have reviewed the patient's current medications. Prior to Admission:  Medications Prior to Admission  Medication Sig Dispense Refill Last Dose   acetaminophen (TYLENOL) 325 MG tablet Take 2 tablets (650 mg total) by mouth every 6 (six) hours as needed for mild pain (or Fever >/= 101). 30 tablet 0 unk   aspirin 81 MG EC tablet Take 1 tablet (81 mg total) by mouth daily. Swallow whole. 30 tablet 0 12/06/2020   atorvastatin (LIPITOR) 80 MG tablet Take 1 tablet (80 mg total) by mouth daily at 6 PM. 30 tablet 0 12/04/2020   bisacodyl (DULCOLAX) 10 MG suppository Place 1 suppository (10 mg total) rectally daily as needed for moderate constipation. 12 suppository 0 unk   carvedilol (COREG) 6.25 MG tablet Take 1 tablet (6.25 mg total) by mouth 2 (two) times daily with a meal.  30 tablet 0 12/07/2020 at 0800   cefTRIAXone (ROCEPHIN) IVPB Inject 1 g into the vein See admin instructions. Bid x 7 days   12/06/2020   clopidogrel (PLAVIX) 75 MG tablet Take 1 tablet (75 mg total) by mouth daily. 21 tablet 0 12/07/2020   docusate sodium (COLACE) 100 MG capsule Take 1 capsule (100 mg total) by mouth 2 (two) times daily. 10 capsule 0 12/06/2020   escitalopram (LEXAPRO) 10 MG tablet Take 1 tablet (10 mg total) by mouth daily. 30 tablet 0 12/07/2020   famotidine (PEPCID) 10 MG tablet Take 10 mg by mouth daily at 6 (six) AM.   12/07/2020   Ferrous Sulfate (IRON HIGH-POTENCY) 142 (45 Fe) MG TBCR Take 1 tablet by mouth daily. 30 tablet 0 12/07/2020   glimepiride (AMARYL) 1 MG tablet Take 1 tablet (1 mg total) by mouth daily with breakfast. 30 tablet 0 12/06/2020   hydrOXYzine (ATARAX) 25 MG tablet Take 25 mg by mouth at bedtime.   12/04/2020   isosorbide-hydrALAZINE (BIDIL) 20-37.5 MG tablet Take 1 tablet by mouth 2 (two) times daily. 30 tablet 0 12/07/2020   loperamide (IMODIUM) 2 MG capsule Take 1 capsule (2 mg total) by mouth as needed for diarrhea or loose stools. (Patient taking differently: Take 2 mg by mouth as needed for diarrhea or loose stools. ** do not exceed 16 mg daily **) 30 capsule 0 unk   Multiple Vitamin (MULTIVITAMIN WITH MINERALS) TABS tablet Take 1 tablet by mouth daily. 30 tablet 0 12/06/2020   NON FORMULARY Take 120 mLs by mouth 3 (three) times daily.   12/07/2020  NOVOLOG FLEXPEN 100 UNIT/ML FlexPen Inject 2-11 Units into the skin See admin instructions. If blood glucose is 101-150, take 2 units  151-200, take 3 units  201-250, take 5 units  251-300, take 7 units  301-350, take 9 units  350, take 11 units  Call MD if blood glucose is over 400 or under 60 (Patient taking differently: Inject 2-11 Units into the skin See admin instructions. If blood glucose is  101-150, take 2 units  151-200, take 3 units  201-250, take 5 units  251-300, take 7 units   301-350, take 9 units  350, take 11 units  Call MD if blood glucose is over 400 or under 60) 15 mL 0 12/07/2020   ondansetron (ZOFRAN) 4 MG tablet Take 4 mg by mouth every 8 (eight) hours as needed for nausea or vomiting.   unk   pregabalin (LYRICA) 25 MG capsule Take 25 mg by mouth 2 (two) times daily.   12/07/2020   tamsulosin (FLOMAX) 0.4 MG CAPS capsule Take 1 capsule (0.4 mg total) by mouth daily. 30 capsule 0 12/06/2020   vitamin C (ASCORBIC ACID) 500 MG tablet Take 1 tablet (500 mg total) by mouth 2 (two) times daily. 30 tablet 0 12/06/2020   Vitamin D, Ergocalciferol, (DRISDOL) 1.25 MG (50000 UNIT) CAPS capsule Take 1 capsule (50,000 Units total) by mouth every Tuesday. 5 capsule 0 12/06/2020   cefTRIAXone (ROCEPHIN) IVPB Inject 1 g into the vein See admin instructions. Every 12 hours x 2 weeks (Patient not taking: Reported on 12/08/2020)   Completed Course   cefTRIAXone (ROCEPHIN) IVPB Inject 1 g into the vein See admin instructions. Every 12 hours x 8 days (Patient not taking: Reported on 12/08/2020)   Completed Course   famotidine (PEPCID) 20 MG tablet Take 1 tablet (20 mg total) by mouth daily. (Patient not taking: Reported on 12/08/2020) 30 tablet 0 Not Taking   feeding supplement (ENSURE ENLIVE / ENSURE PLUS) LIQD Take 237 mLs by mouth 3 (three) times daily between meals. (Patient not taking: Reported on 12/08/2020) 237 mL 12 Not Taking   gentamicin ointment (GARAMYCIN) 0.1 % Apply 1 application topically See admin instructions. Cleanse right shin wound with wound cleanser,apply ointment to wound,apply silver AG and cover with DPD x 2 weeks (Patient not taking: Reported on 12/08/2020)   Not Taking   ROS:                                                                                                                                       Unable to obtain a reliable ROS due to AMS.   Blood pressure (!) 193/71, pulse 60, temperature (!) 97.5 F (36.4 C), temperature source Oral, resp. rate  17, SpO2 97 %.  General Examination:  Physical Exam  HEENT-  Normocephalic, no lesions, without obvious abnormality. Normal external eye and conjunctiva.   Cardiovascular- S1-S2 audible, no pedal edema noted Lungs- no rhonchi or wheezing noted, no excessive working breathing. Saturations within normal limits on telemetry Abdomen- Non-distended, non-tender, with continuous liquid diarrhea stools during examination Extremities- Warm, dry, and intact Musculoskeletal- no joint tenderness, there are scattered abrasions on bilateral lower extremities right > left Skin- cool and dry, with scattered skin tears and abrasions  Neurological Examination Mental Status: Alert, oriented to self. He incorrectly states that he is 75 years old, that the month is October, and the year is 64.  Patient's speech is dysarthric and mostly unintelligible requiring patient to repeat himself multiple times for examiner understanding. He names 2/4 objects clearly but names little finger as "fist" and gives an unintelligible answer when asked to identify "telephone" Patient follows simple commands with repeat coaching.  Cranial Nerves: II: Discs flat bilaterally; Visual fields grossly normal,  III,IV, VI: Extra-ocular motions intact bilaterally without ptosis. Pupils equal, round, reactive to light bilaterally V,VII: Patient has a right facial droop with facial sensation intact and symmetric to light touch  VIII: Hearing normal bilaterally IX,X: Palate rises symmetrically XI: Bilateral shoulder shrug XII: Midline tongue extension Motor: Right : Upper extremity   4/5    Left:     Upper extremity   5/5  Lower extremity   4-/5     Lower extremity   4/5 Tone:normal throughout; bulk is decreased in bilateral lower extremities  Sensory: Sensation to light touch is decreased on the right lower extremity compared to the  left lower extremity. Sensation to light touch is intact and symmetric to bilateral upper extremities  Deep Tendon Reflexes: 2+ and symmetric throughout Plantars: Right: downgoing   Left: downgoing Cerebellar: Bradykinetic movements of left upper extremity, right upper extremity weakness noted. Unable to assess HKS due to trouble following complex commands.  Gait: Deferred for patient safety   1a Level of Conscious.: 0 1b LOC Questions: 2 1c LOC Commands: 1 2 Best Gaze: 0 3 Visual: 0 4 Facial Palsy: 2 5a Motor Arm - left: 0 5b Motor Arm - Right: 1 6a Motor Leg - Left: 0 6b Motor Leg - Right: 1 7 Limb Ataxia: 0 8 Sensory: 1 9 Best Language: 1 10 Dysarthria: 2 11 Extinct. and Inatten.: 0 TOTAL: 11   Lab Results: Basic Metabolic Panel: Recent Labs  Lab 12/07/20 1412 12/07/20 1442 12/08/20 0303  NA 140 141 142  K 5.0 4.9 4.6  CL 113*  --  111  CO2 21*  --  17*  GLUCOSE 89  --  131*  BUN 25*  --  30*  CREATININE 2.75*  --  3.17*  CALCIUM 8.0*  --  8.0*  MG  --   --  1.4*   CBC: Recent Labs  Lab 12/07/20 1412 12/07/20 1442 12/08/20 0303  WBC 11.6*  --  13.4*  NEUTROABS 8.8*  --   --   HGB 9.7* 10.2* 10.0*  HCT 32.2* 30.0* 32.7*  MCV 92.3  --  92.6  PLT 227  --  218   Cardiac Enzymes: No results for input(s): CKTOTAL, CKMB, CKMBINDEX, TROPONINI in the last 168 hours.  Lipid Panel: No results for input(s): CHOL, TRIG, HDL, CHOLHDL, VLDL, LDLCALC in the last 168 hours.  Imaging: DG Chest 2 View  Result Date: 12/07/2020 CLINICAL DATA:  Altered mental status and weakness. EXAM: CHEST - 2 VIEW COMPARISON:  07/11/2020. FINDINGS: The  cardiac silhouette, mediastinal and hilar contours are within normal limits and stable. Low lung volumes with streaky bibasilar atelectasis. No definite infiltrates or effusions. IMPRESSION: Low lung volumes with streaky bibasilar atelectasis. Electronically Signed   By: Marijo Sanes M.D.   On: 12/07/2020 15:10   CT HEAD WO CONTRAST  (5MM)  Result Date: 12/07/2020 CLINICAL DATA:  Delirium. EXAM: CT HEAD WITHOUT CONTRAST TECHNIQUE: Contiguous axial images were obtained from the base of the skull through the vertex without intravenous contrast. COMPARISON:  Head CT dated 10/23/2020. FINDINGS: Brain: Moderate age-related atrophy and chronic microvascular ischemic changes. Right basal ganglia and right cerebellar old infarcts. There is no acute intracranial hemorrhage. No mass effect or midline shift. No extra-axial fluid collection. Vascular: No hyperdense vessel or unexpected calcification. Skull: Normal. Negative for fracture or focal lesion. Sinuses/Orbits: A 2.5 cm right maxillary sinus retention cyst or polyp. The remainder of the visualized paranasal sinuses and mastoid air cells are clear. Other: None IMPRESSION: 1. No acute intracranial pathology. 2. Moderate age-related atrophy and chronic microvascular ischemic changes. Old right basal ganglia and right cerebellar infarcts. Electronically Signed   By: Anner Crete M.D.   On: 12/07/2020 17:41   [name] CONTRAST  Result Date: 12/08/2020 CLINICAL DATA:  Stroke suspected. Additional history provided: Confusion. EXAM: MRI HEAD WITHOUT CONTRAST TECHNIQUE: Multiplanar, multiecho pulse sequences of the brain and surrounding structures were obtained without intravenous contrast. COMPARISON:  Prior head CT examinations 12/07/2020 and earlier. Brain MRI 10/24/2020. FINDINGS: Brain: The examination is intermittently motion degraded, limiting evaluation. Most notably, there is mild-to-moderate motion degradation of the sagittal T1 weighted sequence, moderate motion degradation of the axial T2 FLAIR sequence, moderate motion degradation of the axial T1 weighted sequence and moderate motion degradation of the coronal T2 TSE sequence. Mild-to-moderate generalized cerebral atrophy. Comparatively mild cerebellar atrophy. 6 mm acute infarct within the medial left midbrain (for instance as  seen on series 3, image 22). 7 mm acute infarct within the left pons (for instance as seen on series 3, image 19). Redemonstrated chronic infarct within the callosal body and bilateral centrum semiovale. Redemonstrated chronic small-vessel infarcts within the bilateral basal ganglia, thalami and central brainstem at the pontomesencephalic junction. Associated chronic hemosiderin deposition at site of a chronic small-vessel infarct within the right basal ganglia. Background chronic small vessel ischemic changes which are moderate in the cerebral white matter, and mild in the pons. Redemonstrated chronic infarcts within the right cerebellar hemisphere. No evidence of an intracranial mass. No extra-axial fluid collection. No midline shift. Vascular: Maintained flow voids within the proximal large arterial vessels. Skull and upper cervical spine: No focal suspicious marrow lesion. Sinuses/Orbits: Visualized orbits show no acute finding. Minimal mucosal thickening within the bilateral ethmoid air cells. Other: Trace fluid within the left mastoid air cells. IMPRESSION: Motion degraded examination, as described and limiting evaluation. 6 mm acute infarct within the medial left midbrain. 7 mm acute infarct within the left pons. Otherwise stable non-contrast MRI appearance of the brain as compared to 10/24/2020, with parenchymal atrophy, chronic ischemic changes and chronic infarcts as detailed. Minimal mucosal thickening within the bilateral ethmoid air cells. Trace fluid within the left mastoid air cells. Electronically Signed   By: Kellie Simmering D.O.   On: 12/08/2020 09:16   DG Abd Portable 1V  Result Date: 12/08/2020 CLINICAL DATA:  Nausea and vomiting. EXAM: PORTABLE ABDOMEN - 1 VIEW COMPARISON:  Abdominal radiographs 07/06/2014. FINDINGS: Portions of the upper abdomen are excluded from the field of view. No  dilated loops of small bowel are demonstrated. Nonspecific relative paucity of bowel gas within the abdomen.  No acute bony abnormality identified. IMPRESSION: Portions of the upper abdomen are excluded from the field of view. Nonspecific relative paucity of bowel gas within the abdomen. Consider a CT abdomen/pelvis for further evaluation if there is concern for bowel obstruction. Electronically Signed   By: Kellie Simmering D.O.   On: 12/08/2020 08:16   EEG adult  Result Date: 12/08/2020 Lora Havens, MD     12/08/2020  2:31 PM Patient Name: Hayden Mckinney MRN: [id number] Epilepsy Attending: Lora Havens Referring Physician/Provider: Rolla Plate, DO Date: 12/08/2020 Duration: 24.07 mins Patient history: 75 year old male with altered mental status.  EEG presented with seizure. Level of alertness: Awake AEDs during EEG study: None Technical aspects: This EEG study was done with scalp electrodes positioned according to the 10-20 International system of electrode placement. Electrical activity was acquired at a sampling rate of '500Hz'  and reviewed with a high frequency filter of '70Hz'  and a low frequency filter of '1Hz' . EEG data were recorded continuously and digitally stored. Description: EEG showed continuous generalized polymorphic sharply contoured 6 Hz theta slowing admixed with intermittent generalized 2 to 3 Hz delta slowing. Hyperventilation and photic stimulation were not performed.   ABNORMALITY - Continuous slow, generalized IMPRESSION: This study is suggestive of moderate diffuse encephalopathy, nonspecific etiology. No seizures or epileptiform discharges were seen throughout the recording. Lora Havens    Assessment: 75 year old male presenting with confusion and altered mental status, though with a history of dementia, there are no clear reports of baseline function and last known well time. MRI brain was obtained revealing acute infarctions within the medial left midbrain and left pons.  1. Examination reveals patient with right > left weakness, decreased bulk of bilateral lower extremities,  confusion, dysarthria and mostly unintelligible speech.  2. EEG: Abnormal finding consists of continuous generalized slowing. This study is suggestive of moderate diffuse encephalopathy, nonspecific etiology. No seizures or epileptiform discharges were seen throughout the recording.  3. MRI brain revealed acute infarctions within the medial left midbrain and left pons. Will complete stroke work up.  4. Stroke risk factors: history of stroke, DM, HLD, HTN, and patient's advanced age.   Recommendations: HgbA1c, fasting lipid panel MRA head and neck without contrast (eGFR of 20) Frequent neuro checks Echocardiogram pending  Prophylactic therapy-Antiplatelet med: continue ASA, Plavix, and statin Risk factor modification Permissive hypertension for 24 hours. Treat systolic blood pressure > 200 mmHg Telemetry monitoring PT consult, OT consult, Speech consult Stroke team to follow  Electronically signed: Dr. Kerney Elbe 12/08/2020, 3:37 PM

## 2020-12-08 NOTE — Consult Note (Signed)
WOC Nurse Consult Note: Patient receiving care in ED 30. Reason for Consult: Lower extremity wound Wound type: Patient with multiple BLE wounds related to arterial insufficiency Pressure Injury POA: Yes/No/NA Measurement: Right lateral malleolus wound measures  2 cm x 1.6 cm x 0.3 cm; no odor, no drainage, is pink and scant amount of yellow. Right shin with a partial thickness wound that is pink and measures 3.2 cm x 4 cm. No drainage, no odor. Left first metatarsal head medial aspect wound measures 1.6 cm x 1 cm x no measureable depth. It is pink and dry, no odor. The left lateral malleolus wound measures 1 cm x 1 cm x 0.3 with 1 cm ring of surrounding erythema. No drainage, no odor, the wound is dry. The right medial first metatarsal head has a wound that is pink and yellow and measures 3 cm x 4 cm x 0.4 cm, no drainage, no odor.  ABI results from 07/28/20 clearly demonstrates significant arterial insufficiency to bilateral feet. Wound bed: Drainage (amount, consistency, odor)  Periwound: Dressing procedure/placement/frequency: Wash all wounds on bilateral feet and legs with soap and water, pat dry. Place a size appropriate piece of Aquacel Advantage Kellie Simmering [phone]) over each wound, secure by wrapping kerlix around the foot/leg. Change every day. SATURATE dressings with saline to remove without causing further injury.  I have also added an order for bilateral Prevalon heel lift boots, Kellie Simmering [phone].  Thank you for the consult. [place] nurse will not follow at this time.  Please re-consult the [place] team if needed.  Val Riles, RN, MSN, CWOCN, CNS-BC, pager [phone]

## 2020-12-08 NOTE — Progress Notes (Signed)
PROGRESS NOTE    Hayden Mckinney  WGN:[id number] DOB: 1946/01/03 DOA: 12/07/2020 PCP: Wenda Low, MD   Chief Complaint  Patient presents with   Altered Mental Status    Brief Narrative:   Hayden Mckinney  is a 75 y.o. male, with history of CVA, dementia, diabetic peripheral neuropathy, alcohol abuse, hyperlipidemia, hypertension, sarcoma, stroke, TIA, type 2 diabetes mellitus presents ED with a chief complaint of altered mental status.  Unfortunately the facility from which patient is from could not be reached.  It is reported that patient has ambulatory baseline, with minimal assistance with ADLs.  EMS reports that patient is totally confused at their arrival.  It is unclear what time the symptoms started.  At arrival into the ED, patient did start to have copious amounts of diarrhea.  On my exam the diarrhea appears green liquid.  Patient was recently discharged from the hospital in October 18, and there was no note of continued antibiotics after that stay.  He had been on doxycycline and Bactrim in the past, those were not continued at discharge.  Is not reported if patient had any fevers at the facility.  He is afebrile upon arrival into the ED.  Unfortunately, no further history could be obtained at this time.   In the ED Temp 97.5, heart rate 57-64, respiratory rate 9-19, blood pressure 153/64, satting at 97-100% on room air VBG shows a PCO2 64, pH 7.395 showing that the CO2 retention is likely baseline Slight leukocytosis with a white blood cell count 11.6, hemoglobin 9.7, and then 10.2-this is patient's baseline Chemistry panel reveals a high normal potassium at 5.0, decreased bicarb 21, baseline creatinine 2.75 Initial troponin 8, repeat pending CT head shows no acute intracranial pathology.  Moderate age-related atrophy and chronic microvascular ischemic changes.  Old right basal ganglia and right cerebellar infarcts Chest x-ray shows low lung volumes with streaky bibasilar  atelectasis GI pathogen panel pending Patient started on Ativan as needed, vancomycin p.o., given a 1 L bolus, blood cultures pending, enteric precautions.  MRI brain pending Admission requested for further work-up of altered mental status    Assessment & Plan:   Principal Problem:   Acute metabolic encephalopathy Active Problems:   Type II diabetes mellitus with neurological manifestations (HCC)   HTN (hypertension)   Hyperlipidemia   Severe protein-calorie malnutrition [name]: less than 60% of standard weight) (HCC)   Diarrhea  Acute metabolic encephalopathy -MRI brain significant for acute CVA, -We will likely in setting of infectious process diarrhea and dehydration contributing as well. -No evidence of UTI or infectious etiology on chest x-ray.  Acute CVA -RI brain significant for acute infarct within the medial left midbrain, and left pons, -He is already on aspirin, Plavix and statin will continue  Hypertension -Allow for permissive hypertension due to above  C. difficile diarrhea -Continue with oral vancomycin and IV Flagyl   Type 2 diabetes mellitus -Insulin sliding scale  History of stroke - Continue aspirin, statin, Plavix  Hypertension -allow for Permissive hypertension  Severe protein calorie malnutrition - Continue protein shakes  Leg wounds -will cosnult wound care.     DVT prophylaxis: Heparin Code Status: Full Family Communication: left sister a voicemail Disposition:   Status is: Inpatient  Remains inpatient appropriate because: acute CVA       Consultants:  Neurology   Subjective:  Patient is confused, unable to provide any reliable complaints, but he remains with diarrhea as discussed with staff.  Objective: Vitals:   12/08/20 0730  12/08/20 0815 12/08/20 1115 12/08/20 1200  BP: (!) 188/78 (!) 159/58 124/89 (!) 147/79  Pulse: (!) 113 61 70 63  Resp: 19 20 (!) 24 10  Temp:      TempSrc:      SpO2: 97% 97% 99% 99%     Intake/Output Summary (Last 24 hours) at 12/08/2020 1340 Last data filed at 12/08/2020 0735 Gross per 24 hour  Intake 1197.98 ml  Output --  Net 1197.98 ml   There were no vitals filed for this visit.  Examination:  Awake, confused, answering some questions appropriately and following some commands.  Symmetrical Chest wall movement, Good air movement bilaterally, CTAB RRR,No Gallops,Rubs or new Murmurs, No Parasternal Heave +ve B.Sounds, Abd Soft, No tenderness, No rebound - guarding or rigidity. No Cyanosis, Clubbing or edema, he does have some lower extremity wounds.    Data Reviewed: I have personally reviewed following labs and imaging studies  CBC: Recent Labs  Lab 12/07/20 1412 12/07/20 1442 12/08/20 0303  WBC 11.6*  --  13.4*  NEUTROABS 8.8*  --   --   HGB 9.7* 10.2* 10.0*  HCT 32.2* 30.0* 32.7*  MCV 92.3  --  92.6  PLT 227  --  440    Basic Metabolic Panel: Recent Labs  Lab 12/07/20 1412 12/07/20 1442 12/08/20 0303  NA 140 141 142  K 5.0 4.9 4.6  CL 113*  --  111  CO2 21*  --  17*  GLUCOSE 89  --  131*  BUN 25*  --  30*  CREATININE 2.75*  --  3.17*  CALCIUM 8.0*  --  8.0*  MG  --   --  1.4*    GFR: CrCl cannot be calculated (Unknown ideal weight.).  Liver Function Tests: Recent Labs  Lab 12/07/20 1412 12/08/20 0303  AST 14* 17  ALT 8 9  ALKPHOS 70 67  BILITOT 0.4 1.0  PROT 5.0* 5.1*  ALBUMIN 1.9* 2.1*    CBG: Recent Labs  Lab 12/07/20 1423 12/08/20 0343  GLUCAP 91 131*     Recent Results (from the past 240 hour(s))  Blood culture (routine x 2)     Status: None (Preliminary result)   Collection Time: 12/07/20  2:08 PM   Specimen: BLOOD  Result Value Ref Range Status   Specimen Description BLOOD RIGHT ANTECUBITAL  Final   Special Requests   Final    BOTTLES DRAWN AEROBIC AND ANAEROBIC Blood Culture results may not be optimal due to an inadequate volume of blood received in culture bottles   Culture   Final    NO GROWTH <  24 HOURS Performed at [facility] [address], [place], [place] [address]    Report Status PENDING  Incomplete  Resp Panel by RT-PCR (Flu A&B, Covid)     Status: None   Collection Time: 12/07/20  2:34 PM   Specimen: Nasopharyngeal(NP) swabs in vial transport medium  Result Value Ref Range Status   SARS Coronavirus 2 by RT PCR NEGATIVE NEGATIVE Final    Comment: (NOTE) SARS-CoV-2 target nucleic acids are NOT DETECTED.  The SARS-CoV-2 RNA is generally detectable in upper respiratory specimens during the acute phase of infection. The lowest concentration of SARS-CoV-2 viral copies this assay can detect is 138 copies/mL. A negative result does not preclude SARS-Cov-2 infection and should not be used as the sole basis for treatment or other patient management decisions. A negative result may occur with  improper specimen collection/handling, submission of specimen  other than nasopharyngeal swab, presence of viral mutation(s) within the areas targeted by this assay, and inadequate number of viral copies(<138 copies/mL). A negative result must be combined with clinical observations, patient history, and epidemiological information. The expected result is Negative.  Fact Sheet for Patients:  EntrepreneurPulse.com.au  Fact Sheet for Healthcare Providers:  IncredibleEmployment.be  This test is no t yet approved or cleared by the [place] FDA and  has been authorized for detection and/or diagnosis of SARS-CoV-2 by FDA under an Emergency Use Authorization (EUA). This EUA will remain  in effect (meaning this test can be used) for the duration of the COVID-19 declaration under Section 564(b)(1) of the Act, 21 U.S.C.section 360bbb-3(b)(1), unless the authorization is terminated  or revoked sooner.       Influenza A by PCR NEGATIVE NEGATIVE Final   Influenza B by PCR NEGATIVE NEGATIVE Final    Comment: (NOTE) The Xpert Xpress  SARS-CoV-2/FLU/RSV plus assay is intended as an aid in the diagnosis of influenza from Nasopharyngeal swab specimens and should not be used as a sole basis for treatment. Nasal washings and aspirates are unacceptable for Xpert Xpress SARS-CoV-2/FLU/RSV testing.  Fact Sheet for Patients: EntrepreneurPulse.com.au  Fact Sheet for Healthcare Providers: IncredibleEmployment.be  This test is not yet approved or cleared by the [place] FDA and has been authorized for detection and/or diagnosis of SARS-CoV-2 by FDA under an Emergency Use Authorization (EUA). This EUA will remain in effect (meaning this test can be used) for the duration of the COVID-19 declaration under Section 564(b)(1) of the Act, 21 U.S.C. section 360bbb-3(b)(1), unless the authorization is terminated or revoked.  Performed at [facility], [place] [address], [place], [place] [address]   C Difficile Quick Screen w PCR reflex     Status: Abnormal   Collection Time: 12/07/20  4:23 PM   Specimen: STOOL  Result Value Ref Range Status   C Diff antigen POSITIVE (A) NEGATIVE Final    Comment: CRITICAL RESULT CALLED TO, READ BACK BY AND VERIFIED WITH: CONOR LOZER RN 12/07/2020 @1755  BY JW    C Diff toxin NEGATIVE NEGATIVE Final   C Diff interpretation Results are indeterminate. See PCR results.  Final    Comment: Performed at [facility], [place] [address], [place], [place] [address]  C. Diff by PCR, Reflexed     Status: Abnormal   Collection Time: 12/07/20  4:23 PM  Result Value Ref Range Status   Toxigenic C. Difficile by PCR POSITIVE (A) NEGATIVE Final    Comment: Positive for toxigenic C. difficile with little to no toxin production. Only treat if clinical presentation suggests symptomatic illness. Performed at [facility], [place] [address] [place], [place] [address]          Radiology Studies: DG Chest 2 View  Result Date: 12/07/2020 CLINICAL DATA:   Altered mental status and weakness. EXAM: CHEST - 2 VIEW COMPARISON:  07/11/2020. FINDINGS: The cardiac silhouette, mediastinal and hilar contours are within normal limits and stable. Low lung volumes with streaky bibasilar atelectasis. No definite infiltrates or effusions. IMPRESSION: Low lung volumes with streaky bibasilar atelectasis. Electronically Signed   By: Marijo Sanes M.D.   On: 12/07/2020 15:10   CT HEAD WO CONTRAST (5MM)  Result Date: 12/07/2020 CLINICAL DATA:  Delirium. EXAM: CT HEAD WITHOUT CONTRAST TECHNIQUE: Contiguous axial images were obtained from the base of the skull through the vertex without intravenous contrast. COMPARISON:  Head CT dated 10/23/2020. FINDINGS: Brain: Moderate age-related  atrophy and chronic microvascular ischemic changes. Right basal ganglia and right cerebellar old infarcts. There is no acute intracranial hemorrhage. No mass effect or midline shift. No extra-axial fluid collection. Vascular: No hyperdense vessel or unexpected calcification. Skull: Normal. Negative for fracture or focal lesion. Sinuses/Orbits: A 2.5 cm right maxillary sinus retention cyst or polyp. The remainder of the visualized paranasal sinuses and mastoid air cells are clear. Other: None IMPRESSION: 1. No acute intracranial pathology. 2. Moderate age-related atrophy and chronic microvascular ischemic changes. Old right basal ganglia and right cerebellar infarcts. Electronically Signed   By: Anner Crete M.D.   On: 12/07/2020 17:41   [name] CONTRAST  Result Date: 12/08/2020 CLINICAL DATA:  Stroke suspected. Additional history provided: Confusion. EXAM: MRI HEAD WITHOUT CONTRAST TECHNIQUE: Multiplanar, multiecho pulse sequences of the brain and surrounding structures were obtained without intravenous contrast. COMPARISON:  Prior head CT examinations 12/07/2020 and earlier. Brain MRI 10/24/2020. FINDINGS: Brain: The examination is intermittently motion degraded, limiting evaluation. Most  notably, there is mild-to-moderate motion degradation of the sagittal T1 weighted sequence, moderate motion degradation of the axial T2 FLAIR sequence, moderate motion degradation of the axial T1 weighted sequence and moderate motion degradation of the coronal T2 TSE sequence. Mild-to-moderate generalized cerebral atrophy. Comparatively mild cerebellar atrophy. 6 mm acute infarct within the medial left midbrain (for instance as seen on series 3, image 22). 7 mm acute infarct within the left pons (for instance as seen on series 3, image 19). Redemonstrated chronic infarct within the callosal body and bilateral centrum semiovale. Redemonstrated chronic small-vessel infarcts within the bilateral basal ganglia, thalami and central brainstem at the pontomesencephalic junction. Associated chronic hemosiderin deposition at site of a chronic small-vessel infarct within the right basal ganglia. Background chronic small vessel ischemic changes which are moderate in the cerebral white matter, and mild in the pons. Redemonstrated chronic infarcts within the right cerebellar hemisphere. No evidence of an intracranial mass. No extra-axial fluid collection. No midline shift. Vascular: Maintained flow voids within the proximal large arterial vessels. Skull and upper cervical spine: No focal suspicious marrow lesion. Sinuses/Orbits: Visualized orbits show no acute finding. Minimal mucosal thickening within the bilateral ethmoid air cells. Other: Trace fluid within the left mastoid air cells. IMPRESSION: Motion degraded examination, as described and limiting evaluation. 6 mm acute infarct within the medial left midbrain. 7 mm acute infarct within the left pons. Otherwise stable non-contrast MRI appearance of the brain as compared to 10/24/2020, with parenchymal atrophy, chronic ischemic changes and chronic infarcts as detailed. Minimal mucosal thickening within the bilateral ethmoid air cells. Trace fluid within the left mastoid air  cells. Electronically Signed   By: Kellie Simmering D.O.   On: 12/08/2020 09:16   DG Abd Portable 1V  Result Date: 12/08/2020 CLINICAL DATA:  Nausea and vomiting. EXAM: PORTABLE ABDOMEN - 1 VIEW COMPARISON:  Abdominal radiographs 07/06/2014. FINDINGS: Portions of the upper abdomen are excluded from the field of view. No dilated loops of small bowel are demonstrated. Nonspecific relative paucity of bowel gas within the abdomen. No acute bony abnormality identified. IMPRESSION: Portions of the upper abdomen are excluded from the field of view. Nonspecific relative paucity of bowel gas within the abdomen. Consider a CT abdomen/pelvis for further evaluation if there is concern for bowel obstruction. Electronically Signed   By: Kellie Simmering D.O.   On: 12/08/2020 08:16        Scheduled Meds:  aspirin EC  81 mg Oral Daily   atorvastatin  80 mg  Oral q1800   clopidogrel  75 mg Oral Daily   escitalopram  10 mg Oral Daily   famotidine  20 mg Oral Daily   feeding supplement  237 mL Oral TID BM   heparin  5,000 Units Subcutaneous Q8H   insulin aspart  0-9 Units Subcutaneous TID WC   vancomycin  125 mg Oral QID   Continuous Infusions:  lactated ringers 75 mL/hr at 12/07/20 2326   metronidazole Stopped (12/08/20 0727)     LOS: 1 day       Phillips Climes, MD [facility]   To contact the attending provider between 7A-7P or the covering provider during after hours 7P-7A, please log into the web site [email or web address] and access using universal [facility] password for that web site. If you do not have the password, please call the hospital operator.  12/08/2020, 1:40 PM

## 2020-12-08 NOTE — Evaluation (Signed)
Clinical/Bedside Swallow Evaluation Patient Details  Name: Hayden Mckinney MRN: [id number] Date of Birth: 20-May-1945  Today's Date: 12/08/2020 Time: SLP Start Time (ACUTE ONLY): 1134 SLP Stop Time (ACUTE ONLY): 1155 SLP Time Calculation (min) (ACUTE ONLY): 21 min  Past Medical History:  Past Medical History:  Diagnosis Date   CVA (cerebral vascular accident) ([place])    Dementia ([place])    Diabetic peripheral neuropathy ([place])    a. both feet   ETOH abuse    a. 04/2014 18-24 beers q weekend.   H/O echocardiogram    a. 04/2014 Echo: EF 55-60%, no rwma.   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 04/2014 multiple bateral infarcts, presumed to be embolic;  b. 0/9233 Carotid U/S: 1-39% bilat ICA stenosis.   TIA (transient ischemic attack)    a. 08/2010.   Type II diabetes mellitus ([place])    Past Surgical History:  Past Surgical History:  Procedure Laterality Date   LOOP RECORDER IMPLANT N/A 04/20/2014   Procedure: LOOP RECORDER IMPLANT;  Surgeon: Thompson Grayer, MD;  Location: [facility] C[facility] LAB;  Service: Cardiovascular;  Laterality: N/A;   repair of R hand after trauma     sarcoma removal     TEE WITHOUT CARDIOVERSION N/A 04/20/2014   Procedure: TRANSESOPHAGEAL ECHOCARDIOGRAM (TEE);  Surgeon: Larey Dresser, MD;  Location: [facility] ENDOSCOPY;  Service: Cardiovascular;  Laterality: N/A;   HPI:  75 y.o. male presented to ED from [facility] with a chief complaint of altered mental status. CT/MRI brain negative for acute event.  PMHx CVA (right BG/CR from 2016), dementia, diabetic peripheral neuropathy, alcohol abuse, hyperlipidemia, hypertension, sarcoma, stroke, TIA, type 2 diabetes mellitus    Assessment / Plan / Recommendation  Clinical Impression  Pt alert, participatory.  Speech is dysarthric (baseline uncertain) with poor intelligibility. He accepted bites of applesauce with decreased oral containment/manipulation and drank water with no s/s of  aspiration.  There was mild spillage of applesauce from mouth with no awareness.  He c/o nausea so no further POs were offered; pt self-suctioned thick mucus from oral cavity.  There were no overt s/s of aspiration; he claims he was on a pureed diet at SNF.  Recommend continuing a full liquid diet as ordered and advance to dysphagia 1 when ready.  SLP will follow briefly while hospitalized to determine potential to advance diet.  SLP Visit Diagnosis: Dysphagia, unspecified (R13.10)           Diet Recommendation  Full liquid diet   Medication Administration: Whole meds with puree    Other  Recommendations Oral Care Recommendations: Oral care BID    Recommendations for follow up therapy are one component of a multi-disciplinary discharge planning process, led by the attending physician.  Recommendations may be updated based on patient status, additional functional criteria and insurance authorization.  Follow up Recommendations No SLP follow up      Assistance Recommended at Discharge    Functional Status Assessment    Frequency and Duration min 2x/week  1 week       Prognosis        Swallow Study   General HPI: 75 y.o. male presented to ED from [facility] with a chief complaint of altered mental status. CT/MRI brain negative for acute event.  PMHx CVA (right BG/CR from 2016), dementia, diabetic peripheral neuropathy, alcohol abuse, hyperlipidemia, hypertension, sarcoma, stroke, TIA, type 2 diabetes mellitus Type  of Study: Bedside Swallow Evaluation Previous Swallow Assessment: remote swallow eval 2016 Diet Prior to this Study:  (full liquids) Temperature Spikes Noted: No Respiratory Status: Room air History of Recent Intubation: No Behavior/Cognition: Alert Oral Cavity Assessment: Within Functional Limits Oral Care Completed by SLP: Recent completion by staff Oral Cavity - Dentition: Edentulous Vision: Functional for self-feeding Self-Feeding Abilities: Needs  assist Patient Positioning: Upright in bed Baseline Vocal Quality: Normal Volitional Cough: Strong Volitional Swallow: Able to elicit    Oral/Motor/Sensory Function Overall Oral Motor/Sensory Function: Generalized oral weakness   Ice Chips Ice chips: Within functional limits   Thin Liquid Thin Liquid: Impaired Presentation: Spoon;Cup;Straw Oral Phase Impairments: Reduced labial seal    Nectar Thick Nectar Thick Liquid: Not tested   Honey Thick Honey Thick Liquid: Not tested   Puree Puree: Impaired Presentation: Spoon Oral Phase Impairments: Reduced labial seal Oral Phase Functional Implications: Oral residue   Solid     Solid: Not tested      Juan Quam Laurice 12/08/2020,12:51 PM  Estill Bamberg L. Tivis Ringer, [place] Office number [phone] Pager [phone]

## 2020-12-08 NOTE — Procedures (Signed)
Patient Name: Hayden Mckinney  MRN: [id number]  Epilepsy Attending: Lora Havens  Referring Physician/Provider: Rolla Plate, DO Date: 12/08/2020 Duration: 24.07 mins  Patient history: 75 year old male with altered mental status.  EEG presented with seizure.  Level of alertness: Awake  AEDs during EEG study: None  Technical aspects: This EEG study was done with scalp electrodes positioned according to the 10-20 International system of electrode placement. Electrical activity was acquired at a sampling rate of 500Hz  and reviewed with a high frequency filter of 70Hz  and a low frequency filter of 1Hz . EEG data were recorded continuously and digitally stored.   Description: EEG showed continuous generalized polymorphic sharply contoured 6 Hz theta slowing admixed with intermittent generalized 2 to 3 Hz delta slowing. Hyperventilation and photic stimulation were not performed.     ABNORMALITY - Continuous slow, generalized  IMPRESSION: This study is suggestive of moderate diffuse encephalopathy, nonspecific etiology. No seizures or epileptiform discharges were seen throughout the recording.  [name] Barbra Sarks

## 2020-12-08 NOTE — ED Notes (Signed)
Per night shift RN pt failed swallow screen and unable to tolerate PO. RN sent secure chat to Dr [facility]ron [facility] asking for speech consult, awaiting response.

## 2020-12-08 NOTE — Evaluation (Signed)
Physical Therapy Evaluation Patient Details Name: Hayden Mckinney MRN: [id number] DOB: 11-10-1945 Today's Date: 12/08/2020  History of Present Illness  Pt is a 75 y.o. male who presented 12/07/20 from [facility] with AMS.  MRI revealed 6 mm acute infarct within the medial left midbrain and 7 mm acute infarct within the left pons. EEG suggestive of moderate diffuse encephalopathy, nonspecific etiology. No seizures or epileptiform discharges were seen throughout the recording. PMH: CVA, dementia, diabetic peripheral neuropathy, alcohol abuse, hyperlipidemia, hypertension, sarcoma, stroke, TIA, type 2 diabetes mellitus   Clinical Impression  Pt presents with condition above and deficits mentioned below, see PT Problem List. Per chart review, pt appears to be a long-term resident of [facility], at least since 2017. In addition, per chart review this admission he has been ambulatory at baseline with minA with ADLs. No family member or caregiver present to confirm, and pt is a poor historian reporting he has only been at [facility] a few months and plans to return home. Pt with a hx of R sided weakness and bil knee ankle stiffness/contractures, which continue to be present, limiting his mobility. In addition, pt displays a tendency to attend to tasks more on his L than his R, but is able to look to the R inconsistently when cued. Pt also with deficits in coordination, balance, activity tolerance, sequencing, awareness, and problem-solving, resulting in him requiring maxA for all bed mobility and to transfer and stand partially upright with bil knees blocked and UE support today. Pt is at high risk for falls. Pt would benefit from follow-up with skilled therapy at his SNF at d/c. Will continue to follow acutely.     Recommendations for follow up therapy are one component of a multi-disciplinary discharge planning process, led by the attending physician.  Recommendations may be updated based on  patient status, additional functional criteria and insurance authorization.  Follow Up Recommendations Skilled nursing-short term rehab (<3 hours/day)    Assistance Recommended at Discharge Frequent or constant Supervision/Assistance  Functional Status Assessment Patient has had a recent decline in their functional status and demonstrates the ability to make significant improvements in function in a reasonable and predictable amount of time.  Equipment Recommendations  Other (comment) (defer to next venue of care)    Recommendations for Other Services       Precautions / Restrictions Precautions Precautions: Fall;Other (comment) Precaution Comments: enteric precautions, hx of prior CVA with R sided deficits, bil knee extension and ankle contractures Restrictions Weight Bearing Restrictions: No      Mobility  Bed Mobility Overal bed mobility: Needs Assistance Bed Mobility: Supine to Sit;Sit to Supine;Rolling Rolling: Min assist;Max assist   Supine to sit: Max assist;HOB elevated Sit to supine: Max assist;HOB elevated   General bed mobility comments: Cues provided to manage legs off R EOB, extra time and maxA to complete and ascend trunk, cuing pt to pull up on PT's hand with his L. MaxA to manage legs and trunk to return to supine. MinA to roll to L but maxA to roll to R.    Transfers Overall transfer level: Needs assistance Equipment used: 1 person hand held assist Transfers: Sit to/from Stand Sit to Stand: Max assist           General transfer comment: Bil knees blocked and cued pt to hold onto PT's waist and bring his chest to PT's shoulder to facilitate anterior weight shift. Attempted 3x before successful in clearing buttocks and standing ~60% upright before returning to  sit, performed x2 additional reps but only coming to stand upright ~25% each rep.    Ambulation/Gait               General Gait Details: Unable  Stairs            Wheelchair  Mobility    Modified Rankin (Stroke Patients Only) Modified Rankin (Stroke Patients Only) Pre-Morbid Rankin Score: Moderately severe disability Modified Rankin: Severe disability     Balance Overall balance assessment: Needs assistance Sitting-balance support: Single extremity supported;Feet supported Sitting balance-Leahy Scale: Poor Sitting balance - Comments: UE support and min guard-minA to prevent LOB sitting statically EOB. Postural control: Posterior lean Standing balance support: Bilateral upper extremity supported Standing balance-Leahy Scale: Zero Standing balance comment: MaxA and bil UE support with bil knees blocked to stand partially upright briefly.                             Pertinent Vitals/Pain Pain Assessment: Faces Faces Pain Scale: Hurts even more Pain Location: neck, back, buttocks Pain Descriptors / Indicators: Discomfort;Grimacing;Guarding Pain Intervention(s): Limited activity within patient's tolerance;Monitored during session;Repositioned    Home Living Family/patient expects to be discharged to:: Skilled nursing facility                   Additional Comments: Per chart review, pt appears to be a long-term resident of [facility], at least since 2017. However, pt reporting he has only been there a few months and his plan is to eventually return home. Pt is an unreliable historian.    Prior Function Prior Level of Function : Patient poor historian/Family not available             Mobility Comments: Pt is unreliable historian, reporting he has been walking long distances with a RW and assistance. Per chart review from this admission, pt is ambulatory at baseline with minA for ADLs. ADLs Comments: Pt is unreliable historian, reporting he has been walking long distances with a RW and assistance. Per chart review from this admission, pt is ambulatory at baseline with minA for ADLs.     Hand Dominance   Dominant Hand: Right  (however primarily uses L)    Extremity/Trunk Assessment   Upper Extremity Assessment Upper Extremity Assessment: Defer to OT evaluation    Lower Extremity Assessment Lower Extremity Assessment: RLE deficits/detail;LLE deficits/detail;Generalized weakness RLE Deficits / Details: Decreased strength compared to L, grossly 2+ to 3- MMT scores; limited AAROM R knee extension to about -15 degrees; rests with hip internally rotated and ankle inverted; limitations in ankle dorsiflexion and eversion PROM; wounds (pressure ulcers?) noted on lower legs bil RLE Coordination: decreased fine motor;decreased gross motor LLE Deficits / Details: Grossly 3+ to 4 MMT scores; limited AAROM L knee extension to about -45 degrees; limitations in ankle dorsiflexion and eversion PROM; wounds (pressure ulcers?) noted on lower legs bil LLE Coordination: decreased gross motor;decreased fine motor    Cervical / Trunk Assessment Cervical / Trunk Assessment: Kyphotic  Communication   Communication: Expressive difficulties (difficulty pronouncing words)  Cognition Arousal/Alertness: Awake/alert Behavior During Therapy: Flat affect Overall Cognitive Status: History of cognitive impairments - at baseline                                 General Comments: Per chart, pt has a hx of being a poor historian. Unsure of his baseline though.  Pt reporting he has only been at [facility] a few months and plans to eventually return home, but chart reveals he has been at [facility] at least since 2017. Poor insight into his deficits and safety. Requires simple multi-modal step-by-step cues to sequence tasks, with inconsistent command following. Decreased R side attention noted.        General Comments General comments (skin integrity, edema, etc.): Changed bedding due to bowel movement in bed    Exercises     Assessment/Plan    PT Assessment Patient needs continued PT services  PT Problem List Decreased  strength;Decreased range of motion;Decreased activity tolerance;Decreased balance;Decreased coordination;Decreased mobility;Decreased cognition;Decreased knowledge of use of DME;Decreased safety awareness;Decreased skin integrity;Impaired tone       PT Treatment Interventions DME instruction;Gait training;Functional mobility training;Therapeutic activities;Therapeutic exercise;Balance training;Cognitive remediation;Neuromuscular re-education;Patient/family education;Wheelchair mobility training    PT Goals (Current goals can be found in the Care Plan section)  Acute Rehab PT Goals Patient Stated Goal: to get better PT Goal Formulation: With patient Time For Goal Achievement: 12/22/20 Potential to Achieve Goals: Fair    Frequency Min 3X/week   Barriers to discharge        Co-evaluation               AM-PAC PT "6 Clicks" Mobility  Outcome Measure Help needed turning from your back to your side while in a flat bed without using bedrails?: A Lot Help needed moving from lying on your back to sitting on the side of a flat bed without using bedrails?: A Lot Help needed moving to and from a bed to a chair (including a wheelchair)?: Total Help needed standing up from a chair using your arms (e.g., wheelchair or bedside chair)?: A Lot Help needed to walk in hospital room?: Total Help needed climbing 3-5 steps with a railing? : Total 6 Click Score: 9    End of Session   Activity Tolerance: Patient tolerated treatment well Patient left: in bed;with call bell/phone within reach;with bed alarm set Nurse Communication: Mobility status;Other (comment) (bowel movement in bed) PT Visit Diagnosis: Unsteadiness on feet (R26.81);Muscle weakness (generalized) (M62.81);Difficulty in walking, not elsewhere classified (R26.2);Other symptoms and signs involving the nervous system (R29.898)    Time: 1700-1749 PT Time Calculation (min) (ACUTE ONLY): 29 min   Charges:   PT Evaluation $PT Eval  Moderate Complexity: 1 Mod PT Treatments $Therapeutic Activity: 8-22 mins        Moishe Spice, PT, DPT Acute Rehabilitation Services  Pager: [phone] Office: [phone]   Orvan Falconer 12/08/2020, 6:45 PM

## 2020-12-08 NOTE — ED Notes (Signed)
RN changed linens, brief, and gown after incontinence episode.

## 2020-12-08 NOTE — ED Notes (Signed)
RN gave meds with pudding, crushed larger pills, pt tolerated well.

## 2020-12-08 NOTE — ED Notes (Signed)
RN attempted to arouse the pt. After the pt was alert and responsive RN tried to initiate a swallow screening. Pt was unable to follow commands and drink any water

## 2020-12-09 ENCOUNTER — Inpatient Hospital Stay (HOSPITAL_COMMUNITY): Payer: Medicare Other

## 2020-12-09 ENCOUNTER — Other Ambulatory Visit (HOSPITAL_COMMUNITY): Payer: Self-pay

## 2020-12-09 DIAGNOSIS — A0472 Enterocolitis due to Clostridium difficile, not specified as recurrent: Secondary | ICD-10-CM | POA: Diagnosis not present

## 2020-12-09 DIAGNOSIS — I6389 Other cerebral infarction: Secondary | ICD-10-CM | POA: Diagnosis not present

## 2020-12-09 DIAGNOSIS — E1149 Type 2 diabetes mellitus with other diabetic neurological complication: Secondary | ICD-10-CM

## 2020-12-09 DIAGNOSIS — I6310 Cerebral infarction due to embolism of unspecified precerebral artery: Secondary | ICD-10-CM | POA: Diagnosis not present

## 2020-12-09 DIAGNOSIS — I6340 Cerebral infarction due to embolism of unspecified cerebral artery: Secondary | ICD-10-CM | POA: Insufficient documentation

## 2020-12-09 DIAGNOSIS — A09 Infectious gastroenteritis and colitis, unspecified: Secondary | ICD-10-CM | POA: Diagnosis not present

## 2020-12-09 DIAGNOSIS — I6322 Cerebral infarction due to unspecified occlusion or stenosis of basilar arteries: Secondary | ICD-10-CM

## 2020-12-09 DIAGNOSIS — I10 Essential (primary) hypertension: Secondary | ICD-10-CM

## 2020-12-09 DIAGNOSIS — Z8673 Personal history of transient ischemic attack (TIA), and cerebral infarction without residual deficits: Secondary | ICD-10-CM

## 2020-12-09 DIAGNOSIS — E7800 Pure hypercholesterolemia, unspecified: Secondary | ICD-10-CM

## 2020-12-09 LAB — LIPID PANEL
Cholesterol: 165 mg/dL (ref 0–200)
HDL: 34 mg/dL — ABNORMAL LOW (ref >40)
LDL Cholesterol: 98 mg/dL (ref 0–99)
Total CHOL/HDL Ratio: 4.9 RATIO
Triglycerides: 166 mg/dL — ABNORMAL HIGH (ref <150)
VLDL: 33 mg/dL (ref 0–40)

## 2020-12-09 LAB — GLUCOSE, CAPILLARY
Glucose-Capillary: 151 mg/dL — ABNORMAL HIGH (ref 70–99)
Glucose-Capillary: 121 mg/dL — ABNORMAL HIGH (ref 70–99)
Glucose-Capillary: 108 mg/dL — ABNORMAL HIGH (ref 70–99)
Glucose-Capillary: 86 mg/dL (ref 70–99)

## 2020-12-09 LAB — CBC
HCT: 31.2 % — ABNORMAL LOW (ref 39.0–52.0)
Hemoglobin: 10 g/dL — ABNORMAL LOW (ref 13.0–17.0)
MCH: 28.2 pg (ref 26.0–34.0)
MCHC: 32.1 g/dL (ref 30.0–36.0)
MCV: 88.1 fL (ref 80.0–100.0)
Platelets: 237 10*3/uL (ref 150–400)
RBC: 3.54 MIL/uL — ABNORMAL LOW (ref 4.22–5.81)
RDW: 13.5 % (ref 11.5–15.5)
WBC: 11.9 10*3/uL — ABNORMAL HIGH (ref 4.0–10.5)
nRBC: 0 % (ref 0.0–0.2)

## 2020-12-09 LAB — BASIC METABOLIC PANEL
Anion gap: 7 (ref 5–15)
BUN: 32 mg/dL — ABNORMAL HIGH (ref 8–23)
CO2: 21 mmol/L — ABNORMAL LOW (ref 22–32)
Calcium: 8 mg/dL — ABNORMAL LOW (ref 8.9–10.3)
Chloride: 115 mmol/L — ABNORMAL HIGH (ref 98–111)
Creatinine, Ser: 3.22 mg/dL — ABNORMAL HIGH (ref 0.61–1.24)
GFR, Estimated: 19 mL/min — ABNORMAL LOW (ref >60)
Glucose, Bld: 166 mg/dL — ABNORMAL HIGH (ref 70–99)
Potassium: 4 mmol/L (ref 3.5–5.1)
Sodium: 143 mmol/L (ref 135–145)

## 2020-12-09 LAB — ECHOCARDIOGRAM COMPLETE
Area-P 1/2: 2.22 cm2
S' Lateral: 2.5 cm

## 2020-12-09 LAB — HEMOGLOBIN A1C
Hgb A1c MFr Bld: 7.8 % — ABNORMAL HIGH (ref 4.8–5.6)
Mean Plasma Glucose: 177.16 mg/dL

## 2020-12-09 MED ORDER — TICAGRELOR 90 MG PO TABS
90.0000 mg | ORAL_TABLET | Freq: Two times a day (BID) | ORAL | Status: DC
  Administered 2020-12-09 – 2020-12-13 (×8): 90 mg via ORAL
  Filled 2020-12-09 (×7): qty 1

## 2020-12-09 MED ORDER — HYDRALAZINE HCL 20 MG/ML IJ SOLN
5.0000 mg | Freq: Four times a day (QID) | INTRAMUSCULAR | Status: DC | PRN
  Administered 2020-12-10 – 2020-12-11 (×3): 5 mg via INTRAVENOUS
  Filled 2020-12-09 (×2): qty 1

## 2020-12-09 NOTE — Progress Notes (Addendum)
STROKE TEAM PROGRESS NOTE   ATTENDING NOTE: I reviewed above note and agree with the assessment and plan. Pt was seen and examined.   75 year old male with history of dementia, diabetes, hypertension, hyperlipidemia, PVD, stroke admitted for altered mental status and diarrhea due to C. difficile.  In 04/2014 he had right cerebellum, right frontal, BG/CR and bilateral thalamic infarcts.  Embolic pattern, CTA head and neck showed multifocal intracranial stenosis.  DVT negative, TEE unremarkable.  LDL is 172, loop recorder placed.  Discharged with DAPT.  In 06/2014 patient had a punctate right periventricular infarct, loop recorder negative for stroke.  A1c 7.4.  In 10/2020 patient admitted for slumped over in SNF, slurred speech, aphasia and generalized weakness, status post TN K.  MRI negative for acute stroke.  CT head and neck showed right ICA 60 to 70% stenosis, moderate to severe right terminal ICA and left M2.  EEG negative for seizure.  LDL 107, A1c 8.6.  Discharged on DAPT and Lipitor 80.  On this admission CT no acute abnormality.  MRI showed left pontine and left midbrain and thalamic small infarcts.  MRA head and neck again showed proximal right ICA stenosis as well as intracranial atherosclerosis.  EF 60 to 65%.  LDL 98, A1c 7.8.  Creatinine 3.17.  WBC 11.6->13.4.  C. difficile positive.  Etiology for patient stroke likely due to small vessel disease given location.  He has been on aspirin and Plavix since last admission, will recommend aspirin 81 and Brilinta 90 twice daily for 30 days and then Plavix alone.  Continue Lipitor 80.  Continue C. difficile treatment per primary team.  Avoid low BP.  PT/OT recommend SNF.  For detailed assessment and plan, please refer to above as I have made changes wherever appropriate.   Neurology will sign off. Please call with questions. Pt will follow up with stroke clinic NP at [facility] in about 4 weeks. Thanks for the consult.   Rosalin Hawking, MD  PhD Stroke Neurology 12/09/2020 6:35 PM    SUBJECTIVE (INTERVAL HISTORY) No family at the bedside. Patient is alert and sitting up in bed. He is very dysarthric and has difficulty following commands. He appears comfortable. Hemodynamically stable.  OBJECTIVE Vitals:   12/08/20 2000 12/09/20 0000 12/09/20 0445 12/09/20 0731  BP: (!) 165/63 (!) 174/72 (!) 189/73 (!) 176/70  Pulse: 67 68 75 (!) 54  Resp: 13 20 13 16   Temp: 98.3 F (36.8 C) 97.8 F (36.6 C) 98.9 F (37.2 C) 98.4 F (36.9 C)  TempSrc: Oral Axillary Axillary Axillary  SpO2: 94% 94% 94% 97%    CBC:  Recent Labs  Lab 12/07/20 1412 12/07/20 1442 12/08/20 0303 12/09/20 0213  WBC 11.6*  --  13.4* 11.9*  NEUTROABS 8.8*  --   --   --   HGB 9.7*   < > 10.0* 10.0*  HCT 32.2*   < > 32.7* 31.2*  MCV 92.3  --  92.6 88.1  PLT 227  --  218 237   < > = values in this interval not displayed.    Basic Metabolic Panel:  Recent Labs  Lab 12/08/20 0303 12/09/20 0213  NA 142 143  K 4.6 4.0  CL 111 115*  CO2 17* 21*  GLUCOSE 131* 166*  BUN 30* 32*  CREATININE 3.17* 3.22*  CALCIUM 8.0* 8.0*  MG 1.4*  --     Lipid Panel:  Recent Labs  Lab 12/09/20 0213  CHOL 165  TRIG 166*  HDL 34*  CHOLHDL 4.9  VLDL 33  LDLCALC 98   HgbA1c:  Lab Results  Component Value Date   HGBA1C 7.8 (H) 12/09/2020   Urine Drug Screen: No results found for: LABOPIA, COCAINSCRNUR, LABBENZ, AMPHETMU, THCU, LABBARB  Alcohol Level     Component Value Date/Time   [facility] <5 06/03/2015 2158    IMAGING  Results for orders placed or performed during the hospital encounter of 12/07/20  [name] CONTRAST   Narrative   CLINICAL DATA:  Stroke suspected. Additional history provided: Confusion.  EXAM: MRI HEAD WITHOUT CONTRAST  TECHNIQUE: Multiplanar, multiecho pulse sequences of the brain and surrounding structures were obtained without intravenous contrast.  COMPARISON:  Prior head CT examinations 12/07/2020 and earlier. Brain  MRI 10/24/2020.  FINDINGS: Brain:  The examination is intermittently motion degraded, limiting evaluation. Most notably, there is mild-to-moderate motion degradation of the sagittal T1 weighted sequence, moderate motion degradation of the axial T2 FLAIR sequence, moderate motion degradation of the axial T1 weighted sequence and moderate motion degradation of the coronal T2 TSE sequence.  Mild-to-moderate generalized cerebral atrophy. Comparatively mild cerebellar atrophy.  6 mm acute infarct within the medial left midbrain (for instance as seen on series 3, image 22).  7 mm acute infarct within the left pons (for instance as seen on series 3, image 19).  Redemonstrated chronic infarct within the callosal body and bilateral centrum semiovale.  Redemonstrated chronic small-vessel infarcts within the bilateral basal ganglia, thalami and central brainstem at the pontomesencephalic junction. Associated chronic hemosiderin deposition at site of a chronic small-vessel infarct within the right basal ganglia.  Background chronic small vessel ischemic changes which are moderate in the cerebral white matter, and mild in the pons.  Redemonstrated chronic infarcts within the right cerebellar hemisphere.  No evidence of an intracranial mass.  No extra-axial fluid collection.  No midline shift.  Vascular: Maintained flow voids within the proximal large arterial vessels.  Skull and upper cervical spine: No focal suspicious marrow lesion.  Sinuses/Orbits: Visualized orbits show no acute finding. Minimal mucosal thickening within the bilateral ethmoid air cells.  Other: Trace fluid within the left mastoid air cells.  IMPRESSION: Motion degraded examination, as described and limiting evaluation.  6 mm acute infarct within the medial left midbrain.  7 mm acute infarct within the left pons.  Otherwise stable non-contrast MRI appearance of the brain as compared to 10/24/2020,  with parenchymal atrophy, chronic ischemic changes and chronic infarcts as detailed.  Minimal mucosal thickening within the bilateral ethmoid air cells.  Trace fluid within the left mastoid air cells.   Electronically Signed   By: Kellie Simmering D.O.   On: 12/08/2020 09:16   CT HEAD WO CONTRAST (5MM)   Narrative   CLINICAL DATA:  Delirium.  EXAM: CT HEAD WITHOUT CONTRAST  TECHNIQUE: Contiguous axial images were obtained from the base of the skull through the vertex without intravenous contrast.  COMPARISON:  Head CT dated 10/23/2020.  FINDINGS: Brain: Moderate age-related atrophy and chronic microvascular ischemic changes. Right basal ganglia and right cerebellar old infarcts. There is no acute intracranial hemorrhage. No mass effect or midline shift. No extra-axial fluid collection.  Vascular: No hyperdense vessel or unexpected calcification.  Skull: Normal. Negative for fracture or focal lesion.  Sinuses/Orbits: A 2.5 cm right maxillary sinus retention cyst or polyp. The remainder of the visualized paranasal sinuses and mastoid air cells are clear.  Other: None  IMPRESSION: 1. No acute intracranial pathology. 2. Moderate age-related atrophy and chronic microvascular ischemic changes.  Old right basal ganglia and right cerebellar infarcts.   Electronically Signed   By: Anner Crete M.D.   On: 12/07/2020 17:41        PHYSICAL EXAM  Temp:  [97.6 F (36.4 C)-98.9 F (37.2 C)] 98.4 F (36.9 C) (12/02 0731) Pulse Rate:  [54-75] 54 (12/02 0731) Resp:  [10-24] 16 (12/02 0731) BP: (124-193)/(61-89) 176/70 (12/02 0731) SpO2:  [94 %-99 %] 97 % (12/02 0731)  General - Well nourished, well developed, in no apparent distress.  Ophthalmologic - fundi not visualized due to noncooperation.  Cardiovascular - Regular rhythm and rate, PACs .  Mental Status -  Alert and oriented to self. Voice is soft.  Patient's speech is dysarthric and mostly unintelligible  requiring patient to repeat himself multiple times for examiner understanding. Patient follows simple commands intermittently with repeat coaching.   Cranial Nerves II - XII - II - Visual field intact OU. III, IV, VI - Extraocular movements intact. V - Facial sensation intact bilaterally. VII - Slight right nasolabial fold flattening  VIII - Hearing & vestibular intact bilaterally. X - Palate elevates symmetrically. XI - Chin turning & shoulder shrug intact bilaterally. XII - Tongue protrusion intact.  Motor: Right :  Upper extremity   4/5  - drift                Left:Upper extremity   5/5             Lower extremity   4-/5  - drift                      Lower extremity   4/5 Tone:  normal, bulk is decreased in bilateral lower extremities   Sensory: Sensation to light touch is decreased on the right lower extremity compared to the left lower extremity. Sensation to light touch is intact and symmetric to bilateral upper extremities   Deep Tendon Reflexes: 2+ and symmetric throughout  Cerebellar: Bradykinetic movements of left upper extremity, right upper extremity weakness noted. Unable to assess HKS due to trouble following complex commands.   Gait: Deferred for patient safety    ASSESSMENT/PLAN Mr. MANU RUBEY is a 75 y.o. male with history of  CVA on home aspirin and clopidogrel, TIA, dementia, type 2 diabetes mellitus with peripheral neuropathy, ETOH abuse, hyperlipidemia, essential hypertension, and sarcoma presenting with altered mental status. On arrival, patient was confused without a clear last known well time established. Work up in the ED revealed patient with copious amounts of green, liquid diarrhea without evidence of recent antibiotics or antibiotics at discharge in October. A CTH was obtained without acute intracranial abnormality with moderate age-related atrophy and chronic microvascular ischemic changes. Patient was admitted for further evaluation of acute  encephalopathy and an MRI was obtained revealing a 6 mm acute infarct within the medial left midbrain and a 7 mm acute infarct within the left pons.   Stroke:  101mm acute infarct within the left medial midbrain and a 78mm acute infarct in the left pons secondary to small vessel disease CT head-No acute intracranial pathology. Old right basal ganglia and right cerebellar infarcts. MRI head -6 mm acute infarct within the medial left midbrain. 7 mm acute infarct within the left pons.  MRA head and neck -Stenosis proximal right internal carotid artery. By CTA this appears to be 60-70% diameter stenosis. Intracranial atherosclerotic disease involving the anterior cerebral arteries bilaterally and the middle cerebral artery bilaterally.  2D Echo EF 60 to  65% EEG- Moderate diffuse encephalopathy, no seizures or epileptiform discharges seen LDL 98 HgbA1c 7.8 Heparin for VTE prophylaxis aspirin 81 mg daily and clopidogrel 75 mg daily prior to admission, now on aspirin 81 mg daily and brilinta 90mg  bid. Continue DAPT for 30 days and then plavix alone.   Ongoing aggressive stroke risk factor management Therapy recommendations:  SNF Disposition:  pending  Hypertension Stable  Avoid low BP  Long-term BP goal normotensive  Hyperlipidemia Home meds:  atorvastatin 80, resumed in hospital LDL 98, goal < 70 on atorvastatin 80mg  Continue statin at discharge  Diabetes type II HgbA1c 7.8, goal < 7.0 Uncontrolled SSI CBG monitoring Close PCP follow-up for better DM control  Other Stroke Risk Factors Advanced age Obesity, There is no height or weight on file to calculate BMI., recommend weight loss, diet and exercise as appropriate  Hx stroke/TIA PVD  Other Active Problems Previous CVAs 04/2014 admitted for slurred speech, right ataxia, dysphagia.  MRI showed right superior cerebellum, right frontal acute infarct, right BG/CR subacute infarct, as well as old bilateral thalami infarcts.  CTA head and  neck intracranial stenosis with high-grade right A1, M2, PA, SCA, P1 and left P2 stenosis.  2D echo, LE venous Doppler and TEE negative.  Loop recorder placed.  LDL 172, discharged on DAPT for 3 months. 06/2014 admitted for punctate right periventricular infarct, loop recorder interrogation negative.  A1c 7.4.  Recommend TEE but not done. Dementia SNF resident  Hospital day # 2   Pt seen by NP/Neuro and later by MD. Note/plan to be edited by MD as needed.  Janine Ores, FNP-BC [facility]   To contact Stroke Continuity provider, please refer to [email or web address] After hours, contact General Neurology

## 2020-12-09 NOTE — Evaluation (Signed)
Speech Language Pathology Evaluation Patient Details Name: Hayden Mckinney MRN: [id number] DOB: 1945-01-28 Today's Date: 12/09/2020 Time: 1751-0258 SLP Time Calculation (min) (ACUTE ONLY): 11 min  Problem List:  Patient Active Problem List   Diagnosis Date Noted   Cerebral embolism with cerebral infarction 52/77/8242   Acute metabolic encephalopathy 35/36/1443   Diarrhea 12/07/2020   Encephalopathy 10/25/2020   Severe protein-calorie malnutrition [name]: less than 60% of standard weight) ([place]) 10/25/2020   Cerebral thrombosis with cerebral infarction ([place]) 07/07/2014   Acute CVA (cerebrovascular accident) ([place]) 07/07/2014   Altered mental status 07/06/2014   Syncopal episodes 07/06/2014   Left sided abdominal pain 07/06/2014   Syncope and collapse 07/06/2014   Cerebellar infarct ([place]) 04/21/2014   Aspiration pneumonia ([place]) 04/20/2014   Acute kidney injury ([place]) 04/20/2014   Speech abnormality    Embolic stroke ([place]) 15/40/0867   Type II diabetes mellitus with neurological manifestations ([place]) 08/25/2010   HTN (hypertension) 08/25/2010   Hyperlipidemia 08/25/2010   Past Medical History:  Past Medical History:  Diagnosis Date   CVA (cerebral vascular accident) ([place])    Dementia ([place])    Diabetic peripheral neuropathy ([place])    a. both feet   ETOH abuse    a. 04/2014 18-24 beers q weekend.   H/O echocardiogram    a. 04/2014 Echo: EF 55-60%, no rwma.   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 04/2014 multiple bateral infarcts, presumed to be embolic;  b. 06/1948 Carotid U/S: 1-39% bilat ICA stenosis.   TIA (transient ischemic attack)    a. 08/2010.   Type II diabetes mellitus ([place])    Past Surgical History:  Past Surgical History:  Procedure Laterality Date   LOOP RECORDER IMPLANT N/A 04/20/2014   Procedure: LOOP RECORDER IMPLANT;  Surgeon: Thompson Grayer, MD;  Location: [facility] CATH LAB;  Service: Cardiovascular;  Laterality: N/A;    repair of R hand after trauma     sarcoma removal     TEE WITHOUT CARDIOVERSION N/A 04/20/2014   Procedure: TRANSESOPHAGEAL ECHOCARDIOGRAM (TEE);  Surgeon: Larey Dresser, MD;  Location: [facility] ENDOSCOPY;  Service: Cardiovascular;  Laterality: N/A;   HPI:  75 y.o. male presented to ED from [facility] with a chief complaint of altered mental status. MRI brain 12/1: 6 mm acute infarct within the medial left midbrain. 7 mm acute infarct within the left pons. EEG 12/1: moderate diffuse encephalopath. PMHx CVA (right BG/CR from 2016), dementia, diabetic peripheral neuropathy, alcohol abuse, hyperlipidemia, hypertension, sarcoma, stroke, TIA, type 2 diabetes mellitus   Assessment / Plan / Recommendation Clinical Impression  Pt participated in a limited speech/language evaluation; it was limited due to pt's lethargy, and participation. Pt inconsistently responded to the SLP's questions and responses were infrequently related to the questions. He accurately repeated some phrases, but he exhibited difficulty with sentence repetition. Verbal initiation and production was too limited to thoroughly assess word retrieval and sentence formulation. Receptively, pt was able to accurately respond to some simple yes/no questions, but he exhibited difficulty with consistent completion of even 1-step commands or with more complex auditory comprehension. Pt presented with severe dysarthria characterized by reduced vocal intensity, and reduced articulatory precision which negatively impacted speech intelligibility across all levels. Skilled SLP services are clinically indicated at this time to target dysarthria and to further assess pt's cognition and language.    SLP Assessment  SLP Recommendation/Assessment: Patient needs continued Speech Lanaguage  Pathology Services SLP Visit Diagnosis: Cognitive communication deficit (R41.841);Dysarthria and anarthria (R47.1)    Recommendations for follow up therapy are one  component of a multi-disciplinary discharge planning process, led by the attending physician.  Recommendations may be updated based on patient status, additional functional criteria and insurance authorization.    Follow Up Recommendations  Skilled nursing-short term rehab (<3 hours/day)    Assistance Recommended at Discharge  Frequent or constant Supervision/Assistance  Functional Status Assessment Patient has had a recent decline in their functional status and/or demonstrates limited ability to make significant improvements in function in a reasonable and predictable amount of time  Frequency and Duration min 2x/week  2 weeks      SLP Evaluation Cognition  Overall Cognitive Status: Difficult to assess (Due to speech and language impairments) Arousal/Alertness: Lethargic Orientation Level: Oriented to person Attention: Focused;Sustained Focused Attention: Impaired Focused Attention Impairment: Verbal basic Sustained Attention: Impaired Sustained Attention Impairment: Verbal basic       Comprehension  Auditory Comprehension Overall Auditory Comprehension: Impaired Yes/No Questions: Impaired Basic Biographical Questions:  (3/5) Complex Questions:  (0/5) Commands: Impaired One Step Basic Commands:  (1/4)    Expression Expression Primary Mode of Expression: Verbal Verbal Expression Overall Verbal Expression: Impaired Initiation: Impaired Repetition: Impaired Level of Impairment: Sentence level (3/5) Pragmatics: Impairment Impairments: Eye contact Written Expression Dominant Hand: Right (primarily using L)   Oral / Motor  Oral Motor/Sensory Function Overall Oral Motor/Sensory Function:  (UTA) Motor Speech Overall Motor Speech: Impaired Respiration: Impaired Level of Impairment: Phrase Phonation: Low vocal intensity Resonance: Within functional limits Articulation: Impaired Level of Impairment: Word Intelligibility: Intelligibility reduced Word: 50-74%  accurate Phrase: 25-49% accurate Sentence: Not tested Conversation: Not tested Motor Planning: Witnin functional limits Motor Speech Errors: Consistent   Nathyn Luiz I. Hardin Negus, [place], [place] Office number [phone] Pager [place] 12/09/2020, 1:00 PM

## 2020-12-09 NOTE — NC FL2 (Signed)
[place] LEVEL OF CARE SCREENING TOOL     IDENTIFICATION  Patient Name: Hayden Mckinney Birthdate: 1945/06/12 Sex: male Admission Date (Current Location): 12/07/2020  [facility] and [place] Number:  Herbalist and Address:  [facility], [place] [address] [place], [place] [address]      Provider Number: [id number]  Attending Physician Name and Address:  Elgergawy, Silver Huguenin, MD  Relative Name and Phone Number:       Current Level of Care: Hospital Recommended Level of Care: [place] Prior Approval Number:    Date Approved/Denied:   PASRR Number: [id number]  Discharge Plan: SNF    Current Diagnoses: Patient Active Problem List   Diagnosis Date Noted   Cerebral embolism with cerebral infarction 91/69/4503   Acute metabolic encephalopathy 88/82/8003   Diarrhea 12/07/2020   Encephalopathy 10/25/2020   Severe protein-calorie malnutrition [name]: less than 60% of standard weight) ([place]) 10/25/2020   Cerebral thrombosis with cerebral infarction ([place]) 07/07/2014   Acute CVA (cerebrovascular accident) ([place]) 07/07/2014   Altered mental status 07/06/2014   Syncopal episodes 07/06/2014   Left sided abdominal pain 07/06/2014   Syncope and collapse 07/06/2014   Cerebellar infarct ([place]) 04/21/2014   Aspiration pneumonia ([place]) 04/20/2014   Acute kidney injury ([place]) 04/20/2014   Speech abnormality    Embolic stroke ([place]) 49/17/9150   Type II diabetes mellitus with neurological manifestations ([place]) 08/25/2010   HTN (hypertension) 08/25/2010   Hyperlipidemia 08/25/2010    Orientation RESPIRATION BLADDER Height & Weight     Self  Normal Incontinent, External catheter Weight:   Height:     BEHAVIORAL SYMPTOMS/MOOD NEUROLOGICAL BOWEL NUTRITION STATUS      Incontinent Diet (see dc summary)  AMBULATORY STATUS COMMUNICATION OF NEEDS Skin   Extensive Assist Verbally Other (Comment) (wound on foot)                        Personal Care Assistance Level of Assistance  Bathing, Feeding, Dressing Bathing Assistance: Maximum assistance Feeding assistance: Maximum assistance Dressing Assistance: Maximum assistance     Functional Limitations Info  Sight Sight Info: Impaired        SPECIAL CARE FACTORS FREQUENCY                       Contractures Contractures Info: Not present    Additional Factors Info  Code Status, Allergies Code Status Info: Full Allergies Info: NKA           Current Medications (12/09/2020):  This is the current hospital active medication list Current Facility-Administered Medications  Medication Dose Route Frequency Provider Last Rate Last Admin    stroke: mapping our early stages of recovery book   Does not apply Once Elgergawy, Silver Huguenin, MD       acetaminophen (TYLENOL) tablet 650 mg  650 mg Oral Q6H PRN Zierle-Ghosh, Asia B, DO       Or   acetaminophen (TYLENOL) suppository 650 mg  650 mg Rectal Q6H PRN Zierle-Ghosh, Asia B, DO       aspirin EC tablet 81 mg  81 mg Oral Daily Zierle-Ghosh, Asia B, DO   81 mg at 12/09/20 1142   atorvastatin (LIPITOR) tablet 80 mg  80 mg Oral q1800 Zierle-Ghosh, Asia B, DO   80 mg at 12/08/20 1738   Chlorhexidine Gluconate Cloth 2 % PADS 6 each  6 each Topical Daily Elgergawy, Silver Huguenin, MD  6 each at 12/09/20 1145   clopidogrel (PLAVIX) tablet 75 mg  75 mg Oral Daily Zierle-Ghosh, Asia B, DO   75 mg at 12/09/20 1142   escitalopram (LEXAPRO) tablet 10 mg  10 mg Oral Daily Zierle-Ghosh, Asia B, DO   10 mg at 12/09/20 1143   famotidine (PEPCID) tablet 20 mg  20 mg Oral Daily Zierle-Ghosh, Asia B, DO   20 mg at 12/09/20 1142   feeding supplement (ENSURE ENLIVE / ENSURE PLUS) liquid 237 mL  237 mL Oral TID BM Zierle-Ghosh, Asia B, DO   237 mL at 12/09/20 1144   heparin injection 5,000 Units  5,000 Units Subcutaneous Q8H Zierle-Ghosh, Asia B, DO   5,000 Units at 12/09/20 0519   insulin aspart (novoLOG) injection 0-9 Units  0-9 Units  Subcutaneous TID WC Zierle-Ghosh, Asia B, DO   2 Units at 12/09/20 3491   lactated ringers infusion   Intravenous Continuous Elgergawy, Silver Huguenin, MD 100 mL/hr at 12/09/20 1143 Rate Change at 12/09/20 1143   LORazepam (ATIVAN) injection 0.5 mg  0.5 mg Intravenous Once PRN Zierle-Ghosh, Asia B, DO       metroNIDAZOLE (FLAGYL) IVPB 500 mg  500 mg Intravenous Q8H Zierle-Ghosh, Asia B, DO 100 mL/hr at 12/09/20 0627 500 mg at 12/09/20 0627   ondansetron (ZOFRAN) tablet 4 mg  4 mg Oral Q6H PRN Zierle-Ghosh, Asia B, DO       Or   ondansetron (ZOFRAN) injection 4 mg  4 mg Intravenous Q6H PRN Zierle-Ghosh, Asia B, DO   4 mg at 12/08/20 1351   vancomycin (VANCOCIN) capsule 125 mg  125 mg Oral QID Zierle-Ghosh, Asia B, DO   125 mg at 12/09/20 1142     Discharge Medications: Please see discharge summary for a list of discharge medications.  Relevant Imaging Results:  Relevant Lab Results:   Additional Information SSN: [place], [place]

## 2020-12-09 NOTE — Progress Notes (Signed)
Echocardiogram 2D Echocardiogram has been performed.  Oneal Deputy [place] Deleo RDCS 12/09/2020, 3:29 PM

## 2020-12-09 NOTE — Progress Notes (Signed)
OT Cancellation Note  Patient Details Name: Hayden Mckinney MRN: [id number] DOB: September 12, 1945   Cancelled Treatment:    Reason Eval/Treat Not Completed: Patient at procedure or test/ unavailable- transport arrived to take pt to MRI. Will follow and see as able.  Jolaine Artist, OT Acute Rehabilitation Services Pager [phone] Office [phone]   Hayden Mckinney 12/09/2020, 9:26 AM

## 2020-12-09 NOTE — Progress Notes (Signed)
Speech Language Pathology Treatment: Dysphagia  Patient Details Name: Hayden Mckinney MRN: [id number] DOB: 1945/04/27 Today's Date: 12/09/2020 Time: 1448-1856 SLP Time Calculation (min) (ACUTE ONLY): 9 min  Assessment / Plan / Recommendation Clinical Impression  Pt was seen for dysphagia treatment. He was lethargic and demonstrated difficulty maintaining alertness during the session. Pt's nurse was present and she attributed pt's lethargy to his receipt of oxy this morning. Pt continues to demonstrate impaired bolus awareness. He exhibited some bolus manipulation with purees, but oral holding was frequently noted and moderate cueing was required for pt to swallow purees. RN administered pills whole in puree and additional boluses of thin liquids were necessary to facilitate oral clearance. Coughing was noted with pills once a liquid bolus was added to assist with oral clearance, suggesting aspiration. SLP questions the impact of the dual consistency on bolus control in the setting of lethargy. No s/sx of aspiration were noted with puree or with thin liquids alone. Pt's current diet will be continued and SLP will continue to follow pt.     HPI HPI: 75 y.o. male presented to ED from [facility] with a chief complaint of altered mental status. MRI brain 12/1: 6 mm acute infarct within the medial left midbrain. 7 mm acute infarct within the left pons. EEG 12/1: moderate diffuse encephalopath. PMHx CVA (right BG/CR from 2016), dementia, diabetic peripheral neuropathy, alcohol abuse, hyperlipidemia, hypertension, sarcoma, stroke, TIA, type 2 diabetes mellitus      SLP Plan  Continue with current plan of care      Recommendations for follow up therapy are one component of a multi-disciplinary discharge planning process, led by the attending physician.  Recommendations may be updated based on patient status, additional functional criteria and insurance authorization.    Recommendations  Diet  recommendations: Thin liquid (Continue full liquids) Liquids provided via: Cup;Straw Medication Administration: Whole meds with puree Supervision: Patient able to self feed Compensations: Slow rate;Small sips/bites;Follow solids with liquid Postural Changes and/or Swallow Maneuvers: Seated upright 90 degrees                Oral Care Recommendations: Oral care BID Follow Up Recommendations: Skilled nursing-short term rehab (<3 hours/day) Assistance recommended at discharge: Frequent or constant Supervision/Assistance SLP Visit Diagnosis: Dysphagia, unspecified (R13.10) Plan: Continue with current plan of care       Dinna Severs I. Hardin Negus, [place], [place] Office number [phone] Pager [place]  12/09/2020, 12:41 PM

## 2020-12-09 NOTE — Progress Notes (Signed)
PROGRESS NOTE    Hayden Mckinney  QTM:[id number] DOB: 04/28/1945 DOA: 12/07/2020 PCP: Wenda Low, MD   Chief Complaint  Patient presents with   Altered Mental Status    Brief Narrative:   Hayden Mckinney  is a 75 y.o. male, with history of CVA, dementia, diabetic peripheral neuropathy, alcohol abuse, hyperlipidemia, hypertension, sarcoma, stroke, TIA, type 2 diabetes mellitus presents ED with a chief complaint of altered mental status.  Unfortunately the facility from which patient is from could not be reached.  It is reported that patient has ambulatory baseline, with minimal assistance with ADLs.  EMS reports that patient is totally confused at their arrival.  It is unclear what time the symptoms started.  At arrival into the ED, patient did start to have copious amounts of diarrhea.  On my exam the diarrhea appears green liquid.  Patient was recently discharged from the hospital in October 18, and there was no note of continued antibiotics after that stay.  He had been on doxycycline and Bactrim in the past, those were not continued at discharge.  Is not reported if patient had any fevers at the facility.  He is afebrile upon arrival into the ED.  Unfortunately, no further history could be obtained at this time.     Assessment & Plan:   Principal Problem:   Acute metabolic encephalopathy Active Problems:   Type II diabetes mellitus with neurological manifestations (HCC)   HTN (hypertension)   Hyperlipidemia   Severe protein-calorie malnutrition [name]: less than 60% of standard weight) (HCC)   Diarrhea   Cerebral embolism with cerebral infarction  Acute metabolic encephalopathy -MRI brain significant for acute CVA, -We will likely in setting of infectious process diarrhea and dehydration contributing as well. -No evidence of UTI or infectious etiology on chest x-ray. -Patient remains significantly altered.  Acute CVA -MRI brain significant for acute infarct within the  medial left midbrain, and left pons, -He is already on aspirin, Plavix and statin  -Management per neurology  Hypertension -Allow for permissive hypertension due to above  C. difficile diarrhea -Continue with oral vancomycin and IV Flagyl   Type 2 diabetes mellitus -Insulin sliding scale  CKD stage IV -creatinine is more elevated today, continue with IV fluids, no evidence of obstruction on bladder scan. -Avoid nephrotoxic medications.  History of stroke - Continue aspirin, statin, Plavix  Hypertension -allow for Permissive hypertension  Severe protein calorie malnutrition - Continue protein shakes  Chronic lower extremity leg wounds due to PAD -Continue with wound care     DVT prophylaxis: Heparin Code Status: Full Family Communication: Unable to reach any family member as apparently the available phone number for his sister appears to be wrong number. Disposition:   Status is: Inpatient  Remains inpatient appropriate because: acute CVA       Consultants:  Neurology   Subjective:  No significant events overnight as discussed with staff, diarrhea has improved. Objective: Vitals:   12/08/20 2000 12/09/20 0000 12/09/20 0445 12/09/20 0731  BP: (!) 165/63 (!) 174/72 (!) 189/73 (!) 176/70  Pulse: 67 68 75 (!) 54  Resp: 13 20 13 16   Temp: 98.3 F (36.8 C) 97.8 F (36.6 C) 98.9 F (37.2 C) 98.4 F (36.9 C)  TempSrc: Oral Axillary Axillary Axillary  SpO2: 94% 94% 94% 97%    Intake/Output Summary (Last 24 hours) at 12/09/2020 1120 Last data filed at 12/09/2020 0636 Gross per 24 hour  Intake 1863.41 ml  Output 100 ml  Net 1763.41 ml  There were no vitals filed for this visit.  Examination:  Patient is lethargic today, appears to be more confused and less interactive Symmetrical Chest wall movement, Good air movement bilaterally, CTAB RRR,No Gallops,Rubs or new Murmurs, No Parasternal Heave +ve B.Sounds, Abd Soft, No tenderness, No rebound -  guarding or rigidity. No Cyanosis, Clubbing or edema, he does have some lower extremity wounds.    Data Reviewed: I have personally reviewed following labs and imaging studies  CBC: Recent Labs  Lab 12/07/20 1412 12/07/20 1442 12/08/20 0303 12/09/20 0213  WBC 11.6*  --  13.4* 11.9*  NEUTROABS 8.8*  --   --   --   HGB 9.7* 10.2* 10.0* 10.0*  HCT 32.2* 30.0* 32.7* 31.2*  MCV 92.3  --  92.6 88.1  PLT 227  --  218 629    Basic Metabolic Panel: Recent Labs  Lab 12/07/20 1412 12/07/20 1442 12/08/20 0303 12/09/20 0213  NA 140 141 142 143  K 5.0 4.9 4.6 4.0  CL 113*  --  111 115*  CO2 21*  --  17* 21*  GLUCOSE 89  --  131* 166*  BUN 25*  --  30* 32*  CREATININE 2.75*  --  3.17* 3.22*  CALCIUM 8.0*  --  8.0* 8.0*  MG  --   --  1.4*  --     GFR: CrCl cannot be calculated (Unknown ideal weight.).  Liver Function Tests: Recent Labs  Lab 12/07/20 1412 12/08/20 0303  AST 14* 17  ALT 8 9  ALKPHOS 70 67  BILITOT 0.4 1.0  PROT 5.0* 5.1*  ALBUMIN 1.9* 2.1*    CBG: Recent Labs  Lab 12/08/20 0343 12/08/20 1327 12/08/20 1637 12/08/20 2105 12/09/20 0733  GLUCAP 131* 160* 124* 160* 151*     Recent Results (from the past 240 hour(s))  Blood culture (routine x 2)     Status: None (Preliminary result)   Collection Time: 12/07/20  2:08 PM   Specimen: BLOOD  Result Value Ref Range Status   Specimen Description BLOOD RIGHT ANTECUBITAL  Final   Special Requests   Final    BOTTLES DRAWN AEROBIC AND ANAEROBIC Blood Culture results may not be optimal due to an inadequate volume of blood received in culture bottles   Culture   Final    NO GROWTH 2 DAYS Performed at [facility] [address] [place], [place] [address]    Report Status PENDING  Incomplete  Resp Panel by RT-PCR (Flu A&B, Covid)     Status: None   Collection Time: 12/07/20  2:34 PM   Specimen: Nasopharyngeal(NP) swabs in vial transport medium  Result Value Ref Range Status   SARS Coronavirus 2  by RT PCR NEGATIVE NEGATIVE Final    Comment: (NOTE) SARS-CoV-2 target nucleic acids are NOT DETECTED.  The SARS-CoV-2 RNA is generally detectable in upper respiratory specimens during the acute phase of infection. The lowest concentration of SARS-CoV-2 viral copies this assay can detect is 138 copies/mL. A negative result does not preclude SARS-Cov-2 infection and should not be used as the sole basis for treatment or other patient management decisions. A negative result may occur with  improper specimen collection/handling, submission of specimen other than nasopharyngeal swab, presence of viral mutation(s) within the areas targeted by this assay, and inadequate number of viral copies(<138 copies/mL). A negative result must be combined with clinical observations, patient history, and epidemiological information. The expected result is Negative.  Fact Sheet for Patients:  EntrepreneurPulse.com.au  Fact Sheet for Healthcare Providers:  IncredibleEmployment.be  This test is no t yet approved or cleared by the [place] FDA and  has been authorized for detection and/or diagnosis of SARS-CoV-2 by FDA under an Emergency Use Authorization (EUA). This EUA will remain  in effect (meaning this test can be used) for the duration of the COVID-19 declaration under Section 564(b)(1) of the Act, 21 U.S.C.section 360bbb-3(b)(1), unless the authorization is terminated  or revoked sooner.       Influenza A by PCR NEGATIVE NEGATIVE Final   Influenza B by PCR NEGATIVE NEGATIVE Final    Comment: (NOTE) The Xpert Xpress SARS-CoV-2/FLU/RSV plus assay is intended as an aid in the diagnosis of influenza from Nasopharyngeal swab specimens and should not be used as a sole basis for treatment. Nasal washings and aspirates are unacceptable for Xpert Xpress SARS-CoV-2/FLU/RSV testing.  Fact Sheet for Patients: EntrepreneurPulse.com.au  Fact  Sheet for Healthcare Providers: IncredibleEmployment.be  This test is not yet approved or cleared by the [place] FDA and has been authorized for detection and/or diagnosis of SARS-CoV-2 by FDA under an Emergency Use Authorization (EUA). This EUA will remain in effect (meaning this test can be used) for the duration of the COVID-19 declaration under Section 564(b)(1) of the Act, 21 U.S.C. section 360bbb-3(b)(1), unless the authorization is terminated or revoked.  Performed at [facility], [place] [address] [place], [place] [address]   C Difficile Quick Screen w PCR reflex     Status: Abnormal   Collection Time: 12/07/20  4:23 PM   Specimen: STOOL  Result Value Ref Range Status   C Diff antigen POSITIVE (A) NEGATIVE Final    Comment: CRITICAL RESULT CALLED TO, READ BACK BY AND VERIFIED WITH: CONOR LOZER RN 12/07/2020 @1755  BY JW    C Diff toxin NEGATIVE NEGATIVE Final   C Diff interpretation Results are indeterminate. See PCR results.  Final    Comment: Performed at [facility], [place] [address] [place], [place] [address]  Gastrointestinal Panel by PCR , Stool     Status: None   Collection Time: 12/07/20  4:23 PM   Specimen: STOOL  Result Value Ref Range Status   Campylobacter species NOT DETECTED NOT DETECTED Final   Plesimonas shigelloides NOT DETECTED NOT DETECTED Final   Salmonella species NOT DETECTED NOT DETECTED Final   Yersinia enterocolitica NOT DETECTED NOT DETECTED Final   Vibrio species NOT DETECTED NOT DETECTED Final   Vibrio cholerae NOT DETECTED NOT DETECTED Final   Enteroaggregative E coli (EAEC) NOT DETECTED NOT DETECTED Final   Enteropathogenic E coli (EPEC) NOT DETECTED NOT DETECTED Final   Enterotoxigenic E coli (ETEC) NOT DETECTED NOT DETECTED Final   Shiga like toxin producing E coli (STEC) NOT DETECTED NOT DETECTED Final   Shigella/Enteroinvasive E coli (EIEC) NOT DETECTED NOT DETECTED Final   Cryptosporidium NOT DETECTED  NOT DETECTED Final   Cyclospora cayetanensis NOT DETECTED NOT DETECTED Final   Entamoeba histolytica NOT DETECTED NOT DETECTED Final   Giardia lamblia NOT DETECTED NOT DETECTED Final   Adenovirus F40/41 NOT DETECTED NOT DETECTED Final   Astrovirus NOT DETECTED NOT DETECTED Final   Norovirus GI/GII NOT DETECTED NOT DETECTED Final   Rotavirus A NOT DETECTED NOT DETECTED Final   Sapovirus (I, II, IV, and V) NOT DETECTED NOT DETECTED Final    Comment: Performed at [facility], [place] [place], [place] [address]  C. Diff by PCR, Reflexed     Status:  Abnormal   Collection Time: 12/07/20  4:23 PM  Result Value Ref Range Status   Toxigenic C. Difficile by PCR POSITIVE (A) NEGATIVE Final    Comment: Positive for toxigenic C. difficile with little to no toxin production. Only treat if clinical presentation suggests symptomatic illness. Performed at [facility], [place] [address] [place], [place] [address]          Radiology Studies: DG Chest 2 View  Result Date: 12/07/2020 CLINICAL DATA:  Altered mental status and weakness. EXAM: CHEST - 2 VIEW COMPARISON:  07/11/2020. FINDINGS: The cardiac silhouette, mediastinal and hilar contours are within normal limits and stable. Low lung volumes with streaky bibasilar atelectasis. No definite infiltrates or effusions. IMPRESSION: Low lung volumes with streaky bibasilar atelectasis. Electronically Signed   By: Marijo Sanes M.D.   On: 12/07/2020 15:10   CT HEAD WO CONTRAST (5MM)  Result Date: 12/07/2020 CLINICAL DATA:  Delirium. EXAM: CT HEAD WITHOUT CONTRAST TECHNIQUE: Contiguous axial images were obtained from the base of the skull through the vertex without intravenous contrast. COMPARISON:  Head CT dated 10/23/2020. FINDINGS: Brain: Moderate age-related atrophy and chronic microvascular ischemic changes. Right basal ganglia and right cerebellar old infarcts. There is no acute intracranial hemorrhage. No mass effect or midline  shift. No extra-axial fluid collection. Vascular: No hyperdense vessel or unexpected calcification. Skull: Normal. Negative for fracture or focal lesion. Sinuses/Orbits: A 2.5 cm right maxillary sinus retention cyst or polyp. The remainder of the visualized paranasal sinuses and mastoid air cells are clear. Other: None IMPRESSION: 1. No acute intracranial pathology. 2. Moderate age-related atrophy and chronic microvascular ischemic changes. Old right basal ganglia and right cerebellar infarcts. Electronically Signed   By: Anner Crete M.D.   On: 12/07/2020 17:41   [name] CONTRAST  Result Date: 12/08/2020 CLINICAL DATA:  Stroke suspected. Additional history provided: Confusion. EXAM: MRI HEAD WITHOUT CONTRAST TECHNIQUE: Multiplanar, multiecho pulse sequences of the brain and surrounding structures were obtained without intravenous contrast. COMPARISON:  Prior head CT examinations 12/07/2020 and earlier. Brain MRI 10/24/2020. FINDINGS: Brain: The examination is intermittently motion degraded, limiting evaluation. Most notably, there is mild-to-moderate motion degradation of the sagittal T1 weighted sequence, moderate motion degradation of the axial T2 FLAIR sequence, moderate motion degradation of the axial T1 weighted sequence and moderate motion degradation of the coronal T2 TSE sequence. Mild-to-moderate generalized cerebral atrophy. Comparatively mild cerebellar atrophy. 6 mm acute infarct within the medial left midbrain (for instance as seen on series 3, image 22). 7 mm acute infarct within the left pons (for instance as seen on series 3, image 19). Redemonstrated chronic infarct within the callosal body and bilateral centrum semiovale. Redemonstrated chronic small-vessel infarcts within the bilateral basal ganglia, thalami and central brainstem at the pontomesencephalic junction. Associated chronic hemosiderin deposition at site of a chronic small-vessel infarct within the right basal ganglia.  Background chronic small vessel ischemic changes which are moderate in the cerebral white matter, and mild in the pons. Redemonstrated chronic infarcts within the right cerebellar hemisphere. No evidence of an intracranial mass. No extra-axial fluid collection. No midline shift. Vascular: Maintained flow voids within the proximal large arterial vessels. Skull and upper cervical spine: No focal suspicious marrow lesion. Sinuses/Orbits: Visualized orbits show no acute finding. Minimal mucosal thickening within the bilateral ethmoid air cells. Other: Trace fluid within the left mastoid air cells. IMPRESSION: Motion degraded examination, as described and limiting evaluation. 6 mm acute infarct within the medial left midbrain. 7 mm acute infarct  within the left pons. Otherwise stable non-contrast MRI appearance of the brain as compared to 10/24/2020, with parenchymal atrophy, chronic ischemic changes and chronic infarcts as detailed. Minimal mucosal thickening within the bilateral ethmoid air cells. Trace fluid within the left mastoid air cells. Electronically Signed   By: Kellie Simmering D.O.   On: 12/08/2020 09:16   DG Abd Portable 1V  Result Date: 12/08/2020 CLINICAL DATA:  Nausea and vomiting. EXAM: PORTABLE ABDOMEN - 1 VIEW COMPARISON:  Abdominal radiographs 07/06/2014. FINDINGS: Portions of the upper abdomen are excluded from the field of view. No dilated loops of small bowel are demonstrated. Nonspecific relative paucity of bowel gas within the abdomen. No acute bony abnormality identified. IMPRESSION: Portions of the upper abdomen are excluded from the field of view. Nonspecific relative paucity of bowel gas within the abdomen. Consider a CT abdomen/pelvis for further evaluation if there is concern for bowel obstruction. Electronically Signed   By: Kellie Simmering D.O.   On: 12/08/2020 08:16   EEG adult  Result Date: 12/08/2020 Lora Havens, MD     12/08/2020  2:31 PM Patient Name: DAYVIN ABER MRN:  [id number] Epilepsy Attending: Lora Havens Referring Physician/Provider: Rolla Plate, DO Date: 12/08/2020 Duration: 24.07 mins Patient history: 75 year old male with altered mental status.  EEG presented with seizure. Level of alertness: Awake AEDs during EEG study: None Technical aspects: This EEG study was done with scalp electrodes positioned according to the 10-20 International system of electrode placement. Electrical activity was acquired at a sampling rate of 500Hz  and reviewed with a high frequency filter of 70Hz  and a low frequency filter of 1Hz . EEG data were recorded continuously and digitally stored. Description: EEG showed continuous generalized polymorphic sharply contoured 6 Hz theta slowing admixed with intermittent generalized 2 to 3 Hz delta slowing. Hyperventilation and photic stimulation were not performed.   ABNORMALITY - Continuous slow, generalized IMPRESSION: This study is suggestive of moderate diffuse encephalopathy, nonspecific etiology. No seizures or epileptiform discharges were seen throughout the recording. Priyanka Barbra Sarks        Scheduled Meds:   stroke: mapping our early stages of recovery book   Does not apply Once   aspirin EC  81 mg Oral Daily   atorvastatin  80 mg Oral q1800   Chlorhexidine Gluconate Cloth  6 each Topical Daily   clopidogrel  75 mg Oral Daily   escitalopram  10 mg Oral Daily   famotidine  20 mg Oral Daily   feeding supplement  237 mL Oral TID BM   heparin  5,000 Units Subcutaneous Q8H   insulin aspart  0-9 Units Subcutaneous TID WC   vancomycin  125 mg Oral QID   Continuous Infusions:  lactated ringers 75 mL/hr at 12/09/20 0520   metronidazole 500 mg (12/09/20 0627)     LOS: 2 days       Phillips Climes, MD [facility]   To contact the attending provider between 7A-7P or the covering provider during after hours 7P-7A, please log into the web site [email or web address] and access using universal [place] password  for that web site. If you do not have the password, please call the hospital operator.  12/09/2020, 11:20 AM

## 2020-12-09 NOTE — Evaluation (Signed)
Occupational Therapy Evaluation Patient Details Name: Hayden Mckinney MRN: [id number] DOB: 1945/05/11 Today's Date: 12/09/2020   History of Present Illness Pt is a 75 y.o. male who presented 12/07/20 from [facility] with AMS.  MRI revealed 6 mm acute infarct within the medial left midbrain and 7 mm acute infarct within the left pons. EEG suggestive of moderate diffuse encephalopathy, nonspecific etiology. No seizures or epileptiform discharges were seen throughout the recording. PMH: CVA, dementia, diabetic peripheral neuropathy, alcohol abuse, hyperlipidemia, hypertension, sarcoma, stroke, TIA, type 2 diabetes mellitus   Clinical Impression   Patient admitted for above and is limited by problem list below, including impaired cognition, communication, weakness (R>L), activity tolerance and balance.  He is unable to provide PLOF or home setup, but per chart review and PT eval patient is from SNF (?long term resident) and requires some assist for ADLs and mobility.  He currently requires max assist to complete grooming and total assist for all other self care tasks, attempted bed mobility but patient pushing posteriorly to transition to EOB and unable to complete.  Patient follows 1 step commands inconsistently with multimodal cueing, with poor attention requiring constant redirection.  Based on performance today, believe he will benefit from further OT services to optimize independence and decreased burden of care with ADLs. Will follow.       Recommendations for follow up therapy are one component of a multi-disciplinary discharge planning process, led by the attending physician.  Recommendations may be updated based on patient status, additional functional criteria and insurance authorization.   Follow Up Recommendations  Skilled nursing-short term rehab (<3 hours/day)    Assistance Recommended at Discharge Frequent or constant Supervision/Assistance  Functional Status Assessment  Patient  has had a recent decline in their functional status and demonstrates the ability to make significant improvements in function in a reasonable and predictable amount of time.  Equipment Recommendations  Other (comment) (defer to next venue)    Recommendations for Other Services       Precautions / Restrictions Precautions Precautions: Fall;Other (comment) Precaution Comments: enteric precautions, hx of prior CVA with R sided deficits, bil knee extension and ankle contractures Restrictions Weight Bearing Restrictions: No      Mobility Bed Mobility Overal bed mobility: Needs Assistance Bed Mobility: Supine to Sit     Supine to sit: Total assist;HOB elevated     General bed mobility comments: cueing for LB management towards EOB, initated <25% of L LE and assisted with R LE; pt resistive and pushing posteriorly when assisting to raise trunk--therefore returned back to supine    Transfers                   General transfer comment: deferred      Balance                                           ADL either performed or assessed with clinical judgement   ADL Overall ADL's : Needs assistance/impaired     Grooming: Maximal assistance;Wash/dry face;Bed level Grooming Details (indicate cue type and reason): pt requires total assist initally, fading to max assist as attempts to complete on 2nd trial                             Functional mobility during ADLs: Total assistance General ADL Comments:  overall total assist for all self care and bed mobility     Vision   Additional Comments: able to scan to L and R to find therapist with increased time and max verbal cueing     Perception     Praxis      Pertinent Vitals/Pain Pain Assessment: Faces Faces Pain Scale: No hurt Pain Intervention(s): Monitored during session     Hand Dominance Right (primarily using L)   Extremity/Trunk Assessment Upper Extremity Assessment Upper  Extremity Assessment: RUE deficits/detail;LUE deficits/detail;Difficult to assess due to impaired cognition RUE Deficits / Details: unable to raise UE or hold elevated, 3-/5 grasp RUE Coordination: decreased fine motor;decreased gross motor LUE Deficits / Details: able to raise minimally, grossly 3-/5 MMT at shoulder and grasp LUE Coordination: decreased fine motor;decreased gross motor   Lower Extremity Assessment Lower Extremity Assessment: Defer to PT evaluation       Communication Communication Communication: Receptive difficulties;Expressive difficulties   Cognition Arousal/Alertness: Awake/alert Behavior During Therapy: Flat affect Overall Cognitive Status: Difficult to assess                                 General Comments: pt able to follow simple commands (squeeze hand, thumbs up), poor attention and requires constant redirection     General Comments       Exercises     Shoulder Instructions      Home Living Family/patient expects to be discharged to:: Skilled nursing facility                                 Additional Comments: per chart review, pt long term resident of [facility] (since at least 2017)- per PT eval, pt reports only being there for a few months but pt unable to report to OT      Prior Functioning/Environment Prior Level of Function : Patient poor historian/Family not available               ADLs Comments: Pt is unreliable historian, reporting he has been walking long distances with a RW and assistance. Per chart review from this admission, pt is ambulatory at baseline with minA for ADLs.--pt unable to report to OT, this is from PT eval        OT Problem List: Decreased strength;Decreased activity tolerance;Impaired balance (sitting and/or standing);Decreased range of motion;Impaired vision/perception;Decreased coordination;Decreased cognition;Decreased safety awareness;Decreased knowledge of use of DME or  AE;Decreased knowledge of precautions;Pain;Impaired UE functional use      OT Treatment/Interventions: Self-care/ADL training;Therapeutic exercise;DME and/or AE instruction;Therapeutic activities;Balance training;Patient/family education;Visual/perceptual remediation/compensation;Cognitive remediation/compensation;Neuromuscular education;Splinting    OT Goals(Current goals can be found in the care plan section) Acute Rehab OT Goals Patient Stated Goal: none stated OT Goal Formulation: Patient unable to participate in goal setting Time For Goal Achievement: 12/23/20 Potential to Achieve Goals: Fair  OT Frequency: Min 2X/week   Barriers to D/C:            Co-evaluation              AM-PAC OT "6 Clicks" Daily Activity     Outcome Measure Help from another person eating meals?: Total Help from another person taking care of personal grooming?: A Lot Help from another person toileting, which includes using toliet, bedpan, or urinal?: Total Help from another person bathing (including washing, rinsing, drying)?: Total Help from another person to put  on and taking off regular upper body clothing?: Total Help from another person to put on and taking off regular lower body clothing?: Total 6 Click Score: 7   End of Session Equipment Utilized During Treatment: Oxygen Nurse Communication: Mobility status  Activity Tolerance: Patient tolerated treatment well Patient left: in bed;with call bell/phone within reach;with nursing/sitter in room  OT Visit Diagnosis: Other abnormalities of gait and mobility (R26.89);Muscle weakness (generalized) (M62.81);Other symptoms and signs involving the nervous system (R29.898);Other symptoms and signs involving cognitive function;Cognitive communication deficit (R41.841) Symptoms and signs involving cognitive functions: Cerebral infarction                Time: 2241-1464 OT Time Calculation (min): 26 min Charges:  OT General Charges $OT Visit: 1  Visit OT Evaluation $OT Eval Moderate Complexity: 1 Mod OT Treatments $Self Care/Home Management : 8-22 mins  Jolaine Artist, OT Acute Rehabilitation Services Pager [phone] Office [phone]   Delight Stare 12/09/2020, 11:55 AM

## 2020-12-10 LAB — GLUCOSE, CAPILLARY
Glucose-Capillary: 88 mg/dL (ref 70–99)
Glucose-Capillary: 110 mg/dL — ABNORMAL HIGH (ref 70–99)
Glucose-Capillary: 117 mg/dL — ABNORMAL HIGH (ref 70–99)
Glucose-Capillary: 143 mg/dL — ABNORMAL HIGH (ref 70–99)

## 2020-12-10 MED ORDER — CARVEDILOL 3.125 MG PO TABS
3.1250 mg | ORAL_TABLET | Freq: Two times a day (BID) | ORAL | Status: DC
  Administered 2020-12-10 (×2): 3.125 mg via ORAL
  Filled 2020-12-10 (×2): qty 1

## 2020-12-10 MED ORDER — TAMSULOSIN HCL 0.4 MG PO CAPS
0.4000 mg | ORAL_CAPSULE | Freq: Every day | ORAL | Status: DC
  Administered 2020-12-10 – 2020-12-11 (×2): 0.4 mg via ORAL
  Filled 2020-12-10 (×2): qty 1

## 2020-12-10 NOTE — Progress Notes (Signed)
PROGRESS NOTE    Hayden Mckinney  LHT:[id number] DOB: Oct 04, 1945 DOA: 12/07/2020 PCP: Wenda Low, MD   Chief Complaint  Patient presents with   Altered Mental Status    Brief Narrative:   Hayden Mckinney  is a 75 y.o. male, with history of CVA, dementia, diabetic peripheral neuropathy, alcohol abuse, hyperlipidemia, hypertension, sarcoma, stroke, TIA, type 2 diabetes mellitus presents ED with a chief complaint of altered mental status.  Unfortunately the facility from which patient is from could not be reached.  It is reported that patient has ambulatory baseline, with minimal assistance with ADLs.  EMS reports that patient is totally confused at their arrival.  It is unclear what time the symptoms started.  At arrival into the ED, patient did start to have copious amounts of diarrhea.  On my exam the diarrhea appears green liquid.  Patient was recently discharged from the hospital in October 18, and there was no note of continued antibiotics after that stay.  He had been on doxycycline and Bactrim in the past, those were not continued at discharge.  Is not reported if patient had any fevers at the facility.  He is afebrile upon arrival into the ED.      Assessment & Plan:   Principal Problem:   Acute metabolic encephalopathy Active Problems:   Type II diabetes mellitus with neurological manifestations (HCC)   HTN (hypertension)   Hyperlipidemia   Severe protein-calorie malnutrition Hayden Mckinney: less than 60% of standard weight) (HCC)   Diarrhea   Cerebral embolism with cerebral infarction  Acute metabolic encephalopathy -MRI brain significant for acute CVA -We will likely in setting of infectious process diarrhea and dehydration contributing as well. -No evidence of UTI or infectious etiology on chest x-ray. -Patient has started to improve.  Acute CVA -MRI brain significant for acute infarct within the medial left midbrain, and left pons, -He is already on aspirin, Plavix and  statin  -Management per neurology, neuro recommend aspirin 81 and Brilinta 90 twice daily for 30 days and then Plavix alone.  Continue Lipitor 80.  Hypertension -Allow for permissive hypertension , will start today on low-dose Coreg, and as needed hydralazine to keep systolic blood pressure <572.  C. difficile diarrhea -Continue with oral vancomycin . -Hayden Mckinney patient was on Rocephin at SNF.   Type 2 diabetes mellitus -Insulin sliding scale  CKD stage IV -creatinine is more elevated today, continue with IV fluids, no evidence of obstruction on bladder scan. -Avoid nephrotoxic medications.  History of stroke - Continue aspirin, statin, and Brilinta  Hypertension -allow for Permissive hypertension  Severe protein calorie malnutrition - Continue protein shakes  Chronic lower extremity leg wounds due to PAD -Continue with wound care  Urinary retention -Foley catheter inserted 12/3, started on Flomax    DVT prophylaxis: Heparin Code Status: Full Family Communication: Unable to reach any family member as apparently the available phone number for his sister appears to be wrong number. Disposition:   Status is: Inpatient  Remains inpatient appropriate because: acute CVA       Consultants:  Neurology   Subjective:  no significant events as discussed with staff, diarrhea has improved.  Patient with urinary retention required Foley catheter insertion.  Objective: Vitals:   12/10/20 0455 12/10/20 0812 12/10/20 1059 12/10/20 1131  BP: (!) 188/90 (!) 210/77 (!) 191/78 (!) 165/79  Pulse: 75 75  65  Resp: 13 17  17   Temp: 98 F (36.7 C) 98.3 F (36.8 C)  98.1 F (36.7 C)  TempSrc:  Oral  Oral  SpO2: 98% 98%  99%    Intake/Output Summary (Last 24 hours) at 12/10/2020 1332 Last data filed at 12/10/2020 1200 Gross per 24 hour  Intake 320 ml  Output 900 ml  Net -580 ml   There were no vitals filed for this visit.  Examination:  Awake Alert, more appropriate,  communicative today, patient with significant dysarthria, frail , deconditioned Symmetrical Chest wall movement, Good air movement bilaterally, CTAB RRR,No Gallops,Rubs or new Murmurs, No Parasternal Heave +ve B.Sounds, Abd Soft, No tenderness, No rebound - guarding or rigidity. No Cyanosis, Clubbing or edema, he does have some lower extremity wounds.    Data Reviewed: I have personally reviewed following labs and imaging studies  CBC: Recent Labs  Lab 12/07/20 1412 12/07/20 1442 12/08/20 0303 12/09/20 0213  WBC 11.6*  --  13.4* 11.9*  NEUTROABS 8.8*  --   --   --   HGB 9.7* 10.2* 10.0* 10.0*  HCT 32.2* 30.0* 32.7* 31.2*  MCV 92.3  --  92.6 88.1  PLT 227  --  218 562    Basic Metabolic Panel: Recent Labs  Lab 12/07/20 1412 12/07/20 1442 12/08/20 0303 12/09/20 0213  NA 140 141 142 143  K 5.0 4.9 4.6 4.0  CL 113*  --  111 115*  CO2 21*  --  17* 21*  GLUCOSE 89  --  131* 166*  BUN 25*  --  30* 32*  CREATININE 2.75*  --  3.17* 3.22*  CALCIUM 8.0*  --  8.0* 8.0*  MG  --   --  1.4*  --     GFR: CrCl cannot be calculated (Unknown ideal weight.).  Liver Function Tests: Recent Labs  Lab 12/07/20 1412 12/08/20 0303  AST 14* 17  ALT 8 9  ALKPHOS 70 67  BILITOT 0.4 1.0  PROT 5.0* 5.1*  ALBUMIN 1.9* 2.1*    CBG: Recent Labs  Lab 12/09/20 1219 12/09/20 1638 12/09/20 2056 12/10/20 0814 12/10/20 1130  GLUCAP 121* 108* 86 88 110*     Recent Results (from the past 240 hour(s))  Blood culture (routine x 2)     Status: None (Preliminary result)   Collection Time: 12/07/20  2:08 PM   Specimen: BLOOD  Result Value Ref Range Status   Specimen Description BLOOD RIGHT ANTECUBITAL  Final   Special Requests   Final    BOTTLES DRAWN AEROBIC AND ANAEROBIC Blood Culture results may not be optimal due to an inadequate volume of blood received in culture bottles   Culture   Final    NO GROWTH 3 DAYS Performed at [facility] [address] [place], [place]  [address]    Report Status PENDING  Incomplete  Resp Panel by RT-PCR (Flu A&B, Covid)     Status: None   Collection Time: 12/07/20  2:34 PM   Specimen: Nasopharyngeal(NP) swabs in vial transport medium  Result Value Ref Range Status   SARS Coronavirus 2 by RT PCR NEGATIVE NEGATIVE Final    Comment: (NOTE) SARS-CoV-2 target nucleic acids are NOT DETECTED.  The SARS-CoV-2 RNA is generally detectable in upper respiratory specimens during the acute phase of infection. The lowest concentration of SARS-CoV-2 viral copies this assay can detect is 138 copies/mL. A negative result does not preclude SARS-Cov-2 infection and should not be used as the sole basis for treatment or other patient management decisions. A negative result may occur with  improper specimen collection/handling, submission of specimen other than  nasopharyngeal swab, presence of viral mutation(s) within the areas targeted by this assay, and inadequate number of viral copies(<138 copies/mL). A negative result must be combined with clinical observations, patient history, and epidemiological information. The expected result is Negative.  Fact Sheet for Patients:  EntrepreneurPulse.com.au  Fact Sheet for Healthcare Providers:  IncredibleEmployment.be  This test is no t yet approved or cleared by the [place] FDA and  has been authorized for detection and/or diagnosis of SARS-CoV-2 by FDA under an Emergency Use Authorization (EUA). This EUA will remain  in effect (meaning this test can be used) for the duration of the COVID-19 declaration under Section 564(b)(1) of the Act, 21 U.S.C.section 360bbb-3(b)(1), unless the authorization is terminated  or revoked sooner.       Influenza A by PCR NEGATIVE NEGATIVE Final   Influenza B by PCR NEGATIVE NEGATIVE Final    Comment: (NOTE) The Xpert Xpress SARS-CoV-2/FLU/RSV plus assay is intended as an aid in the diagnosis of influenza from  Nasopharyngeal swab specimens and should not be used as a sole basis for treatment. Nasal washings and aspirates are unacceptable for Xpert Xpress SARS-CoV-2/FLU/RSV testing.  Fact Sheet for Patients: EntrepreneurPulse.com.au  Fact Sheet for Healthcare Providers: IncredibleEmployment.be  This test is not yet approved or cleared by the [place] FDA and has been authorized for detection and/or diagnosis of SARS-CoV-2 by FDA under an Emergency Use Authorization (EUA). This EUA will remain in effect (meaning this test can be used) for the duration of the COVID-19 declaration under Section 564(b)(1) of the Act, 21 U.S.C. section 360bbb-3(b)(1), unless the authorization is terminated or revoked.  Performed at [facility], [place] [address], [place], [place] [address]   C Difficile Quick Screen w PCR reflex     Status: Abnormal   Collection Time: 12/07/20  4:23 PM   Specimen: STOOL  Result Value Ref Range Status   C Diff antigen POSITIVE (A) NEGATIVE Final    Comment: CRITICAL RESULT CALLED TO, READ BACK BY AND VERIFIED WITH: CONOR LOZER RN 12/07/2020 @1755  BY JW    C Diff toxin NEGATIVE NEGATIVE Final   C Diff interpretation Results are indeterminate. See PCR results.  Final    Comment: Performed at [facility], [place] [address] [place], [place] [address]  Gastrointestinal Panel by PCR , Stool     Status: None   Collection Time: 12/07/20  4:23 PM   Specimen: STOOL  Result Value Ref Range Status   Campylobacter species NOT DETECTED NOT DETECTED Final   Plesimonas shigelloides NOT DETECTED NOT DETECTED Final   Salmonella species NOT DETECTED NOT DETECTED Final   Yersinia enterocolitica NOT DETECTED NOT DETECTED Final   Vibrio species NOT DETECTED NOT DETECTED Final   Vibrio cholerae NOT DETECTED NOT DETECTED Final   Enteroaggregative E coli (EAEC) NOT DETECTED NOT DETECTED Final   Enteropathogenic E coli (EPEC) NOT DETECTED NOT  DETECTED Final   Enterotoxigenic E coli (ETEC) NOT DETECTED NOT DETECTED Final   Shiga like toxin producing E coli (STEC) NOT DETECTED NOT DETECTED Final   Shigella/Enteroinvasive E coli (EIEC) NOT DETECTED NOT DETECTED Final   Cryptosporidium NOT DETECTED NOT DETECTED Final   Cyclospora cayetanensis NOT DETECTED NOT DETECTED Final   Entamoeba histolytica NOT DETECTED NOT DETECTED Final   Giardia lamblia NOT DETECTED NOT DETECTED Final   Adenovirus F40/41 NOT DETECTED NOT DETECTED Final   Astrovirus NOT DETECTED NOT DETECTED Final   Norovirus GI/GII NOT DETECTED NOT DETECTED Final  Rotavirus A NOT DETECTED NOT DETECTED Final   Sapovirus (I, II, IV, and V) NOT DETECTED NOT DETECTED Final    Comment: Performed at [facility], [place] [place], [place] [address]  C. Diff by PCR, Reflexed     Status: Abnormal   Collection Time: 12/07/20  4:23 PM  Result Value Ref Range Status   Toxigenic C. Difficile by PCR POSITIVE (A) NEGATIVE Final    Comment: Positive for toxigenic C. difficile with little to no toxin production. Only treat if clinical presentation suggests symptomatic illness. Performed at [facility], [place] [address] [place], [place] [address]          Radiology Studies: MR ANGIO HEAD WO CONTRAST  Result Date: 12/09/2020 CLINICAL DATA:  Stroke EXAM: MRA NECK WITHOUT CONTRAST MRA HEAD WITHOUT CONTRAST TECHNIQUE: Angiographic images of the Circle of Willis were acquired using MRA technique without intravenous contrast. COMPARISON:  CT angio head and neck 10/23/2020. FINDINGS: MRA NECK FINDINGS Normal aortic arch and proximal great vessels. Mild stenosis proximal right internal carotid artery on MRA. On CTA this appears more severe, estimated 60-70% diameter stenosis. CT appears to be more accurate in this case. Left carotid bifurcation widely patent Both vertebral arteries are patent to the skull base. Left vertebral artery is dominant. MRA HEAD FINDINGS  Internal carotid artery is patent bilaterally. Hypoplastic right A1 segment. Both anterior cerebral arteries supplied from the left. Moderate stenosis left A2 segment. Mild stenosis right A2 segment. Mild stenosis distal right M1 segment. Right middle cerebral artery branches patent without significant stenosis Left M1 segment widely patent. Moderately severe stenosis left M2 segment. No large vessel occlusion. IMPRESSION: 1. Stenosis proximal right internal carotid artery. By CTA this appears to be 60-70% diameter stenosis. 2. Left vertebral artery widely patent. Both vertebral arteries patent 3. Intracranial atherosclerotic disease involving the anterior cerebral arteries bilaterally and the middle cerebral artery bilaterally. No large vessel occlusion. Electronically Signed   By: Franchot Gallo M.D.   On: 12/09/2020 13:02   MR ANGIO NECK WO CONTRAST  Result Date: 12/09/2020 CLINICAL DATA:  Stroke EXAM: MRA NECK WITHOUT CONTRAST MRA HEAD WITHOUT CONTRAST TECHNIQUE: Angiographic images of the Circle of Willis were acquired using MRA technique without intravenous contrast. COMPARISON:  CT angio head and neck 10/23/2020. FINDINGS: MRA NECK FINDINGS Normal aortic arch and proximal great vessels. Mild stenosis proximal right internal carotid artery on MRA. On CTA this appears more severe, estimated 60-70% diameter stenosis. CT appears to be more accurate in this case. Left carotid bifurcation widely patent Both vertebral arteries are patent to the skull base. Left vertebral artery is dominant. MRA HEAD FINDINGS Internal carotid artery is patent bilaterally. Hypoplastic right A1 segment. Both anterior cerebral arteries supplied from the left. Moderate stenosis left A2 segment. Mild stenosis right A2 segment. Mild stenosis distal right M1 segment. Right middle cerebral artery branches patent without significant stenosis Left M1 segment widely patent. Moderately severe stenosis left M2 segment. No large vessel  occlusion. IMPRESSION: 1. Stenosis proximal right internal carotid artery. By CTA this appears to be 60-70% diameter stenosis. 2. Left vertebral artery widely patent. Both vertebral arteries patent 3. Intracranial atherosclerotic disease involving the anterior cerebral arteries bilaterally and the middle cerebral artery bilaterally. No large vessel occlusion. Electronically Signed   By: Franchot Gallo M.D.   On: 12/09/2020 13:02   EEG adult  Result Date: 12/08/2020 Lora Havens, MD     12/08/2020  2:31 PM Patient Name: Bence Trapp  [place] MRN: [id number] Epilepsy Attending: Lora Havens Referring Physician/Provider: Rolla Plate, DO Date: 12/08/2020 Duration: 24.07 mins Patient history: 75 year old male with altered mental status.  EEG presented with seizure. Level of alertness: Awake AEDs during EEG study: None Technical aspects: This EEG study was done with scalp electrodes positioned according to the 10-20 International system of electrode placement. Electrical activity was acquired at a sampling rate of 500Hz  and reviewed with a high frequency filter of 70Hz  and a low frequency filter of 1Hz . EEG data were recorded continuously and digitally stored. Description: EEG showed continuous generalized polymorphic sharply contoured 6 Hz theta slowing admixed with intermittent generalized 2 to 3 Hz delta slowing. Hyperventilation and photic stimulation were not performed.   ABNORMALITY - Continuous slow, generalized IMPRESSION: This study is suggestive of moderate diffuse encephalopathy, nonspecific etiology. No seizures or epileptiform discharges were seen throughout the recording. Lora Havens   ECHOCARDIOGRAM COMPLETE  Result Date: 12/09/2020    ECHOCARDIOGRAM REPORT   Patient Name:   LEODAN BOLYARD Date of Exam: 12/09/2020 Medical Rec #:  [id number]        Height:       69.0 in Accession #:    [id number]       Weight:       148.1 lb Date of Birth:  18-Feb-1945       BSA:          1.819 m  Patient Age:    12 years         BP:           160/75 mmHg Patient Gender: M                HR:           62 bpm. Exam Location:  Inpatient Procedure: 2D Echo, Color Doppler and Cardiac Doppler Indications:    Stroke i63.9  History:        Patient has prior history of Echocardiogram examinations, most                 recent 04/21/2014. Risk Factors:Hypertension, Diabetes,                 Dyslipidemia and ETOH.  Sonographer:    Raquel Sarna Senior RDCS Referring Phys: [place]  1. Left ventricular ejection fraction, by estimation, is 60 to 65%. The left ventricle has normal function. The left ventricle has no regional wall motion abnormalities. There is mild left ventricular hypertrophy. Left ventricular diastolic parameters are indeterminate.  2. Right ventricular systolic function is normal. The right ventricular size is normal. Tricuspid regurgitation signal is inadequate for assessing PA pressure.  3. Left atrial size was mildly dilated.  4. The mitral valve is normal in structure. Trivial mitral valve regurgitation. No evidence of mitral stenosis.  5. The aortic valve is tricuspid. Aortic valve regurgitation is not visualized. Aortic valve sclerosis/calcification is present, without any evidence of aortic stenosis. FINDINGS  Left Ventricle: Left ventricular ejection fraction, by estimation, is 60 to 65%. The left ventricle has normal function. The left ventricle has no regional wall motion abnormalities. The left ventricular internal cavity size was normal in size. There is  mild left ventricular hypertrophy. Left ventricular diastolic parameters are indeterminate. Right Ventricle: The right ventricular size is normal. No increase in right ventricular wall thickness. Right ventricular systolic function is normal. Tricuspid regurgitation signal is inadequate for assessing PA pressure. Left Atrium: Left atrial size was mildly dilated. Right Atrium:  Right atrial size was normal in size.  Pericardium: Trivial pericardial effusion is present. Presence of epicardial fat layer. Mitral Valve: The mitral valve is normal in structure. Trivial mitral valve regurgitation. No evidence of mitral valve stenosis. Tricuspid Valve: The tricuspid valve is normal in structure. Tricuspid valve regurgitation is trivial. Aortic Valve: The aortic valve is tricuspid. Aortic valve regurgitation is not visualized. Aortic valve sclerosis/calcification is present, without any evidence of aortic stenosis. Pulmonic Valve: The pulmonic valve was not well visualized. Pulmonic valve regurgitation is not visualized. Aorta: The aortic root and ascending aorta are structurally normal, with no evidence of dilitation. IAS/Shunts: The interatrial septum was not well visualized.  LEFT VENTRICLE PLAX 2D LVIDd:         4.40 cm   Diastology LVIDs:         2.50 cm   LV e' medial:    7.45 cm/s LV PW:         1.10 cm   LV E/e' medial:  9.6 LV IVS:        0.80 cm   LV e' lateral:   7.53 cm/s LVOT diam:     2.10 cm   LV E/e' lateral: 9.5 LV SV:         74 LV SV Index:   41 LVOT Area:     3.46 cm  RIGHT VENTRICLE RV S prime:     11.40 cm/s TAPSE (M-mode): 1.8 cm LEFT ATRIUM             Index        RIGHT ATRIUM           Index LA diam:        3.70 cm 2.03 cm/m   RA Area:     12.70 cm LA Vol (A2C):   65.5 ml 36.02 ml/m  RA Volume:   23.40 ml  12.87 ml/m LA Vol (A4C):   54.9 ml 30.19 ml/m LA Biplane Vol: 61.7 ml 33.93 ml/m  AORTIC VALVE LVOT Vmax:   98.70 cm/s LVOT Vmean:  63.200 cm/s LVOT VTI:    0.215 m  AORTA Ao Root diam: 3.00 cm Ao Asc diam:  3.50 cm MITRAL VALVE MV Area (PHT): 2.22 cm    SHUNTS MV Decel Time: 342 msec    Systemic VTI:  0.22 m MV E velocity: 71.70 cm/s  Systemic Diam: 2.10 cm MV A velocity: 76.10 cm/s MV E/A ratio:  0.94 Oswaldo Milian MD Electronically signed by Oswaldo Milian MD Signature Date/Time: 12/09/2020/5:25:41 PM    Final         Scheduled Meds:   stroke: mapping our early stages of  recovery book   Does not apply Once   aspirin EC  81 mg Oral Daily   atorvastatin  80 mg Oral q1800   carvedilol  3.125 mg Oral BID WC   Chlorhexidine Gluconate Cloth  6 each Topical Daily   escitalopram  10 mg Oral Daily   famotidine  20 mg Oral Daily   feeding supplement  237 mL Oral TID BM   heparin  5,000 Units Subcutaneous Q8H   insulin aspart  0-9 Units Subcutaneous TID WC   tamsulosin  0.4 mg Oral Daily   ticagrelor  90 mg Oral BID   vancomycin  125 mg Oral QID   Continuous Infusions:  lactated ringers 100 mL/hr at 12/10/20 0552     LOS: 3 days       Phillips Climes, MD [facility]   To  contact the attending provider between 7A-7P or the covering provider during after hours 7P-7A, please log into the web site [email or web address] and access using universal [place] password for that web site. If you do not have the password, please call the hospital operator.  12/10/2020, 1:32 PM

## 2020-12-11 LAB — CBC
HCT: 29.9 % — ABNORMAL LOW (ref 39.0–52.0)
Hemoglobin: 9.3 g/dL — ABNORMAL LOW (ref 13.0–17.0)
MCH: 27.6 pg (ref 26.0–34.0)
MCHC: 31.1 g/dL (ref 30.0–36.0)
MCV: 88.7 fL (ref 80.0–100.0)
Platelets: 235 10*3/uL (ref 150–400)
RBC: 3.37 MIL/uL — ABNORMAL LOW (ref 4.22–5.81)
RDW: 13.4 % (ref 11.5–15.5)
WBC: 5.8 10*3/uL (ref 4.0–10.5)
nRBC: 0 % (ref 0.0–0.2)

## 2020-12-11 LAB — BASIC METABOLIC PANEL
Anion gap: 5 (ref 5–15)
BUN: 25 mg/dL — ABNORMAL HIGH (ref 8–23)
CO2: 23 mmol/L (ref 22–32)
Calcium: 8 mg/dL — ABNORMAL LOW (ref 8.9–10.3)
Chloride: 114 mmol/L — ABNORMAL HIGH (ref 98–111)
Creatinine, Ser: 2.63 mg/dL — ABNORMAL HIGH (ref 0.61–1.24)
GFR, Estimated: 24 mL/min — ABNORMAL LOW (ref >60)
Glucose, Bld: 129 mg/dL — ABNORMAL HIGH (ref 70–99)
Potassium: 4.1 mmol/L (ref 3.5–5.1)
Sodium: 142 mmol/L (ref 135–145)

## 2020-12-11 LAB — GLUCOSE, CAPILLARY
Glucose-Capillary: 102 mg/dL — ABNORMAL HIGH (ref 70–99)
Glucose-Capillary: 111 mg/dL — ABNORMAL HIGH (ref 70–99)
Glucose-Capillary: 133 mg/dL — ABNORMAL HIGH (ref 70–99)
Glucose-Capillary: 180 mg/dL — ABNORMAL HIGH (ref 70–99)

## 2020-12-11 MED ORDER — DOXAZOSIN MESYLATE 2 MG PO TABS
2.0000 mg | ORAL_TABLET | Freq: Every day | ORAL | Status: DC
  Administered 2020-12-11 – 2020-12-13 (×3): 2 mg via ORAL
  Filled 2020-12-11 (×3): qty 1

## 2020-12-11 MED ORDER — CARVEDILOL 6.25 MG PO TABS
6.2500 mg | ORAL_TABLET | Freq: Two times a day (BID) | ORAL | Status: DC
  Administered 2020-12-11 – 2020-12-13 (×6): 6.25 mg via ORAL
  Filled 2020-12-11 (×6): qty 1

## 2020-12-11 MED ORDER — HYDRALAZINE HCL 10 MG PO TABS
10.0000 mg | ORAL_TABLET | Freq: Three times a day (TID) | ORAL | Status: DC
  Administered 2020-12-11 – 2020-12-12 (×4): 10 mg via ORAL
  Filled 2020-12-11 (×4): qty 1

## 2020-12-11 NOTE — Consult Note (Incomplete)
Consultation Note Date: 12/11/2020   Patient Name: Hayden Mckinney  DOB: 1945-10-25  MRN: [id number]  Age / Sex: 75 y.o., male  PCP: Wenda Low, MD Referring Physician: Albertine Patricia, MD  Reason for Consultation: Establishing goals of care and Psychosocial/spiritual support  HPI/Patient Profile: 75 y.o. male  with past medical history of *** admitted on 12/07/2020 with ***.   Clinical Assessment and Goals of Care: ***  {Primary Decision DJMEQ:68341}    SUMMARY OF RECOMMENDATIONS   *** Code Status/Advance Care Planning: {Palliative Code status:23503}   Symptom Management:  ***  Palliative Prophylaxis:  {Palliative Prophylaxis:21015}  Additional Recommendations (Limitations, Scope, Preferences): {Recommended Scope and Preferences:21019}  Psycho-social/Spiritual:  Desire for further Chaplaincy support:{YES NO:22349} Additional Recommendations: {PAL SOCIAL:21064}  Prognosis:  {Palliative Care Prognosis:23504}  Discharge Planning: {Palliative dispostion:23505}      Primary Diagnoses: Present on Admission:  Acute metabolic encephalopathy  Type II diabetes mellitus with neurological manifestations (HCC)  HTN (hypertension)  Hyperlipidemia  Severe protein-calorie malnutrition ([place]: less than 60% of standard weight) ([place])  Diarrhea   I have reviewed the medical record, interviewed the patient and family, and examined the patient. The following aspects are pertinent.  Past Medical History:  Diagnosis Date   CVA (cerebral vascular accident) ([place])    Dementia ([place])    Diabetic peripheral neuropathy ([place])    a. both feet   ETOH abuse    a. 04/2014 18-24 beers q weekend.   H/O echocardiogram    a. 04/2014 Echo: EF 55-60%, no rwma.   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 04/2014 multiple bateral infarcts, presumed to be  embolic;  b. 09/6220 Carotid U/S: 1-39% bilat ICA stenosis.   TIA (transient ischemic attack)    a. 08/2010.   Type II diabetes mellitus ([place])    Social History   Socioeconomic History   Marital status: Widowed    Spouse name: Not on file   Number of children: Not on file   Years of education: Not on file   Highest education level: Not on file  Occupational History   Occupation: Surveyor, quantity    Comment: retired  Tobacco Use   Smoking status: Never   Smokeless tobacco: Never  Substance and Sexual Activity   Alcohol use: Yes    Alcohol/week: 6.0 standard drinks    Types: 6 Standard drinks or equivalent per week    Comment: 18-24 beers every weekend.   Drug use: No   Sexual activity: Not on file  Other Topics Concern   Not on file  Social History Narrative   Lives with a room mate in [place].  Sedentary.  Drinks between 18 and 24 beers every weekend.   Social Determinants of Health   Financial Resource Strain: Not on file  Food Insecurity: Not on file  Transportation Needs: Not on file  Physical Activity: Not on file  Stress: Not on file  Social Connections: Not  on file   Family History  Problem Relation Age of Onset   Cancer Mother    Cancer Father    Diabetes Brother    Scheduled Meds:   stroke: mapping our early stages of recovery book   Does not apply Once   aspirin EC  81 mg Oral Daily   atorvastatin  80 mg Oral q1800   carvedilol  6.25 mg Oral BID WC   Chlorhexidine Gluconate Cloth  6 each Topical Daily   doxazosin  2 mg Oral Daily   escitalopram  10 mg Oral Daily   famotidine  20 mg Oral Daily   feeding supplement  237 mL Oral TID BM   heparin  5,000 Units Subcutaneous Q8H   hydrALAZINE  10 mg Oral Q8H   insulin aspart  0-9 Units Subcutaneous TID WC   ticagrelor  90 mg Oral BID   vancomycin  125 mg Oral QID   Continuous Infusions:  lactated ringers 50 mL/hr at 12/11/20 1338   PRN Meds:.acetaminophen **OR** acetaminophen, hydrALAZINE,  LORazepam, ondansetron **OR** ondansetron (ZOFRAN) IV Medications Prior to Admission:  Prior to Admission medications   Medication Sig Start Date End Date Taking? Authorizing Provider  acetaminophen (TYLENOL) 325 MG tablet Take 2 tablets (650 mg total) by mouth every 6 (six) hours as needed for mild pain (or Fever >/= 101). 10/25/20  Yes August Albino, NP  aspirin 81 MG EC tablet Take 1 tablet (81 mg total) by mouth daily. Swallow whole. 10/26/20  Yes August Albino, NP  atorvastatin (LIPITOR) 80 MG tablet Take 1 tablet (80 mg total) by mouth daily at 6 PM. 10/25/20  Yes August Albino, NP  bisacodyl (DULCOLAX) 10 MG suppository Place 1 suppository (10 mg total) rectally daily as needed for moderate constipation. 10/25/20  Yes August Albino, NP  carvedilol (COREG) 6.25 MG tablet Take 1 tablet (6.25 mg total) by mouth 2 (two) times daily with a meal. 10/25/20  Yes August Albino, NP  cefTRIAXone (ROCEPHIN) IVPB Inject 1 g into the vein See admin instructions. Bid x 7 days   Yes [provider]  clopidogrel (PLAVIX) 75 MG tablet Take 1 tablet (75 mg total) by mouth daily. 10/26/20  Yes August Albino, NP  docusate sodium (COLACE) 100 MG capsule Take 1 capsule (100 mg total) by mouth 2 (two) times daily. 10/25/20  Yes August Albino, NP  escitalopram (LEXAPRO) 10 MG tablet Take 1 tablet (10 mg total) by mouth daily. 10/25/20  Yes August Albino, NP  famotidine (PEPCID) 10 MG tablet Take 10 mg by mouth daily at 6 (six) AM.   Yes [provider]  Ferrous Sulfate (IRON HIGH-POTENCY) 142 (45 Fe) MG TBCR Take 1 tablet by mouth daily. 10/25/20  Yes August Albino, NP  glimepiride (AMARYL) 1 MG tablet Take 1 tablet (1 mg total) by mouth daily with breakfast. 10/25/20  Yes August Albino, NP  hydrOXYzine (ATARAX) 25 MG tablet Take 25 mg by mouth at bedtime.   Yes [provider]  isosorbide-hydrALAZINE (BIDIL) 20-37.5 MG tablet Take 1 tablet by mouth 2 (two) times daily.  10/25/20  Yes August Albino, NP  loperamide (IMODIUM) 2 MG capsule Take 1 capsule (2 mg total) by mouth as needed for diarrhea or loose stools. Patient taking differently: Take 2 mg by mouth as needed for diarrhea or loose stools. ** do not exceed 16 mg daily ** 10/25/20  Yes August Albino, NP  Multiple Vitamin (  MULTIVITAMIN WITH MINERALS) TABS tablet Take 1 tablet by mouth daily. 10/26/20  Yes August Albino, NP  NON FORMULARY Take 120 mLs by mouth 3 (three) times daily.   Yes [provider]  NOVOLOG FLEXPEN 100 UNIT/ML FlexPen Inject 2-11 Units into the skin See admin instructions. If blood glucose is 101-150, take 2 units  151-200, take 3 units  201-250, take 5 units  251-300, take 7 units  301-350, take 9 units  350, take 11 units  Call MD if blood glucose is over 400 or under 60 Patient taking differently: Inject 2-11 Units into the skin See admin instructions. If blood glucose is  101-150, take 2 units  151-200, take 3 units  201-250, take 5 units  251-300, take 7 units  301-350, take 9 units  350, take 11 units  Call MD if blood glucose is over 400 or under 60 10/25/20  Yes August Albino, NP  ondansetron (ZOFRAN) 4 MG tablet Take 4 mg by mouth every 8 (eight) hours as needed for nausea or vomiting.   Yes [provider]  pregabalin (LYRICA) 25 MG capsule Take 25 mg by mouth 2 (two) times daily.   Yes [provider]  tamsulosin (FLOMAX) 0.4 MG CAPS capsule Take 1 capsule (0.4 mg total) by mouth daily. 10/25/20  Yes August Albino, NP  vitamin C (ASCORBIC ACID) 500 MG tablet Take 1 tablet (500 mg total) by mouth 2 (two) times daily. 10/25/20  Yes August Albino, NP  Vitamin D, Ergocalciferol, (DRISDOL) 1.25 MG (50000 UNIT) CAPS capsule Take 1 capsule (50,000 Units total) by mouth every Tuesday. 10/25/20  Yes August Albino, NP  cefTRIAXone (ROCEPHIN) IVPB Inject 1 g into the vein See admin instructions. Every 12 hours x 2 weeks Patient not taking:  Reported on 12/08/2020    [provider]  cefTRIAXone (ROCEPHIN) IVPB Inject 1 g into the vein See admin instructions. Every 12 hours x 8 days Patient not taking: Reported on 12/08/2020    [provider]  famotidine (PEPCID) 20 MG tablet Take 1 tablet (20 mg total) by mouth daily. Patient not taking: Reported on 12/08/2020 10/25/20   August Albino, NP  feeding supplement (ENSURE ENLIVE / ENSURE PLUS) LIQD Take 237 mLs by mouth 3 (three) times daily between meals. Patient not taking: Reported on 12/08/2020 10/25/20   August Albino, NP  gentamicin ointment (GARAMYCIN) 0.1 % Apply 1 application topically See admin instructions. Cleanse right shin wound with wound cleanser,apply ointment to wound,apply silver AG and cover with DPD x 2 weeks Patient not taking: Reported on 12/08/2020    [provider]   No Known Allergies Review of Systems  Physical Exam  Vital Signs: BP (!) 187/75 (BP Location: Right Arm)    Pulse 64    Temp 98.1 F (36.7 C) (Oral)    Resp 17    SpO2 98%  Pain Scale: PAINAD   Pain Score: Asleep   SpO2: SpO2: 98 % O2 Device:SpO2: 98 % O2 Flow Rate: .O2 Flow Rate (L/min): 2 L/min  IO: Intake/output summary:  Intake/Output Summary (Last 24 hours) at 12/11/2020 1401 Last data filed at 12/11/2020 0557 Gross per 24 hour  Intake 3187.98 ml  Output 300 ml  Net 2887.98 ml    LBM: Last BM Date: 12/09/20 Baseline Weight:   Most recent weight:       Palliative Assessment/Data:     Time In: *** Time Out: *** Time Total: *** Greater  than 50%  of this time was spent counseling and coordinating care related to the above assessment and plan.  Signed by: Wadie Lessen, NP   Please contact Palliative Medicine Team phone at [phone] for questions and concerns.  For individual provider: See Shea Evans

## 2020-12-11 NOTE — Discharge Summary (Signed)
PROGRESS NOTE    Hayden Mckinney  MSX:[id number] DOB: August 31, 1945 DOA: 12/07/2020 PCP: Wenda Low, MD   Chief Complaint  Patient presents with   Altered Mental Status    Brief Narrative:   Hayden Mckinney  is a 75 y.o. male, with history of CVA, dementia, diabetic peripheral neuropathy, alcohol abuse, hyperlipidemia, hypertension, sarcoma, stroke, TIA, type 2 diabetes mellitus presents ED with a chief complaint of altered mental status.  Unfortunately the facility from which patient is from could not be reached.  It is reported that patient has ambulatory baseline, with minimal assistance with ADLs.  EMS reports that patient is totally confused at their arrival.  It is unclear what time the symptoms started.  At arrival into the ED, patient did start to have copious amounts of diarrhea.  On my exam the diarrhea appears green liquid.  Patient was recently discharged from the hospital in October 18, and there was no note of continued antibiotics after that stay.  He had been on doxycycline and Bactrim in the past, those were not continued at discharge.  Is not reported if patient had any fevers at the facility.  He is afebrile upon arrival into the ED.      Assessment & Plan:   Principal Problem:   Acute metabolic encephalopathy Active Problems:   Type II diabetes mellitus with neurological manifestations (HCC)   HTN (hypertension)   Hyperlipidemia   Severe protein-calorie malnutrition Hayden Mckinney: less than 60% of standard weight) (HCC)   Diarrhea   Cerebral embolism with cerebral infarction  Acute metabolic encephalopathy -MRI brain significant for acute CVA -We will likely in setting of infectious process diarrhea and dehydration contributing as well. -No evidence of UTI or infectious etiology on chest x-ray. -Patient has started to improve.  Acute CVA -MRI brain significant for acute infarct within the medial left midbrain, and left pons, -He is already on aspirin, Plavix and  statin  -Management per neurology, neuro recommend aspirin 81 and Brilinta 90 twice daily for 30 days and then Plavix alone.  Continue Lipitor 80.  Hypertension -Allow for permissive hypertension , will work on lowering blood pressure gradually, he is currently back on his home dose Coreg, will add low-dose hydralazine today as well, will keep on as needed hydralazine.    C. difficile diarrhea -Continue with oral vancomycin . -[place] patient was on Rocephin at SNF.   Type 2 diabetes mellitus -Insulin sliding scale  CKD stage IV -creatinine is more elevated today, continue with IV fluids, no evidence of obstruction on bladder scan. -Avoid nephrotoxic medications.  History of stroke - Continue aspirin, statin, and Brilinta   Severe protein calorie malnutrition - Continue protein shakes  Chronic lower extremity leg wounds due to PAD -Continue with wound care  Urinary retention -Foley catheter inserted 12/3, will start on cardura    DVT prophylaxis: Heparin Code Status: Full Family Communication: Unable to reach any family member as apparently the available phone number for his sister appears to be wrong number. Disposition:   Status is: Inpatient  Remains inpatient appropriate because: acute CVA       Consultants:  Neurology   Subjective:  no significant events as discussed with staff, no further diarrhea, oral intake remains poor.   Objective: Vitals:   12/11/20 0000 12/11/20 0100 12/11/20 0400 12/11/20 0744  BP: (!) 211/93 (!) 192/80 (!) 197/75 (!) 197/78  Pulse: 71 67 68 71  Resp: 11 13 14 15   Temp: 98 F (36.7 C) 98 F (36.7  C) 98.5 F (36.9 C) 98.6 F (37 C)  TempSrc: Oral   Oral  SpO2: 97% 96% 94% 96%    Intake/Output Summary (Last 24 hours) at 12/11/2020 1117 Last data filed at 12/11/2020 0557 Gross per 24 hour  Intake 3387.98 ml  Output 450 ml  Net 2937.98 ml   There were no vitals filed for this visit.  Examination:  Awake Alert,  patient with significant dysarthria, frail , deconditioned Symmetrical Chest wall movement, Good air movement bilaterally, CTAB RRR,No Gallops,Rubs or new Murmurs, No Parasternal Heave +ve B.Sounds, Abd Soft, No tenderness, No rebound - guarding or rigidity. No Cyanosis, Clubbing or edema, No new Rash or bruise       Data Reviewed: I have personally reviewed following labs and imaging studies  CBC: Recent Labs  Lab 12/07/20 1412 12/07/20 1442 12/08/20 0303 12/09/20 0213 12/11/20 0039  WBC 11.6*  --  13.4* 11.9* 5.8  NEUTROABS 8.8*  --   --   --   --   HGB 9.7* 10.2* 10.0* 10.0* 9.3*  HCT 32.2* 30.0* 32.7* 31.2* 29.9*  MCV 92.3  --  92.6 88.1 88.7  PLT 227  --  218 237 850    Basic Metabolic Panel: Recent Labs  Lab 12/07/20 1412 12/07/20 1442 12/08/20 0303 12/09/20 0213 12/11/20 0039  NA 140 141 142 143 142  K 5.0 4.9 4.6 4.0 4.1  CL 113*  --  111 115* 114*  CO2 21*  --  17* 21* 23  GLUCOSE 89  --  131* 166* 129*  BUN 25*  --  30* 32* 25*  CREATININE 2.75*  --  3.17* 3.22* 2.63*  CALCIUM 8.0*  --  8.0* 8.0* 8.0*  MG  --   --  1.4*  --   --     GFR: CrCl cannot be calculated (Unknown ideal weight.).  Liver Function Tests: Recent Labs  Lab 12/07/20 1412 12/08/20 0303  AST 14* 17  ALT 8 9  ALKPHOS 70 67  BILITOT 0.4 1.0  PROT 5.0* 5.1*  ALBUMIN 1.9* 2.1*    CBG: Recent Labs  Lab 12/10/20 0814 12/10/20 1130 12/10/20 1617 12/10/20 2104 12/11/20 0748  GLUCAP 88 110* 117* 143* 102*     Recent Results (from the past 240 hour(s))  Blood culture (routine x 2)     Status: None (Preliminary result)   Collection Time: 12/07/20  2:08 PM   Specimen: BLOOD  Result Value Ref Range Status   Specimen Description BLOOD RIGHT ANTECUBITAL  Final   Special Requests   Final    BOTTLES DRAWN AEROBIC AND ANAEROBIC Blood Culture results may not be optimal due to an inadequate volume of blood received in culture bottles   Culture   Final    NO GROWTH 4  DAYS Performed at [facility], [place] [address] [place], [place] [address]    Report Status PENDING  Incomplete  Resp Panel by RT-PCR (Flu A&B, Covid)     Status: None   Collection Time: 12/07/20  2:34 PM   Specimen: Nasopharyngeal(NP) swabs in vial transport medium  Result Value Ref Range Status   SARS Coronavirus 2 by RT PCR NEGATIVE NEGATIVE Final    Comment: (NOTE) SARS-CoV-2 target nucleic acids are NOT DETECTED.  The SARS-CoV-2 RNA is generally detectable in upper respiratory specimens during the acute phase of infection. The lowest concentration of SARS-CoV-2 viral copies this assay can detect is 138 copies/mL. A negative result does not preclude SARS-Cov-2 infection and  should not be used as the sole basis for treatment or other patient management decisions. A negative result may occur with  improper specimen collection/handling, submission of specimen other than nasopharyngeal swab, presence of viral mutation(s) within the areas targeted by this assay, and inadequate number of viral copies(<138 copies/mL). A negative result must be combined with clinical observations, patient history, and epidemiological information. The expected result is Negative.  Fact Sheet for Patients:  EntrepreneurPulse.com.au  Fact Sheet for Healthcare Providers:  IncredibleEmployment.be  This test is no t yet approved or cleared by the [place] FDA and  has been authorized for detection and/or diagnosis of SARS-CoV-2 by FDA under an Emergency Use Authorization (EUA). This EUA will remain  in effect (meaning this test can be used) for the duration of the COVID-19 declaration under Section 564(b)(1) of the Act, 21 U.S.C.section 360bbb-3(b)(1), unless the authorization is terminated  or revoked sooner.       Influenza A by PCR NEGATIVE NEGATIVE Final   Influenza B by PCR NEGATIVE NEGATIVE Final    Comment: (NOTE) The Xpert Xpress  SARS-CoV-2/FLU/RSV plus assay is intended as an aid in the diagnosis of influenza from Nasopharyngeal swab specimens and should not be used as a sole basis for treatment. Nasal washings and aspirates are unacceptable for Xpert Xpress SARS-CoV-2/FLU/RSV testing.  Fact Sheet for Patients: EntrepreneurPulse.com.au  Fact Sheet for Healthcare Providers: IncredibleEmployment.be  This test is not yet approved or cleared by the [place] FDA and has been authorized for detection and/or diagnosis of SARS-CoV-2 by FDA under an Emergency Use Authorization (EUA). This EUA will remain in effect (meaning this test can be used) for the duration of the COVID-19 declaration under Section 564(b)(1) of the Act, 21 U.S.C. section 360bbb-3(b)(1), unless the authorization is terminated or revoked.  Performed at [facility], [place] [address] [place], [place] [address]   C Difficile Quick Screen w PCR reflex     Status: Abnormal   Collection Time: 12/07/20  4:23 PM   Specimen: STOOL  Result Value Ref Range Status   C Diff antigen POSITIVE (A) NEGATIVE Final    Comment: CRITICAL RESULT CALLED TO, READ BACK BY AND VERIFIED WITH: CONOR LOZER RN 12/07/2020 @1755  BY JW    C Diff toxin NEGATIVE NEGATIVE Final   C Diff interpretation Results are indeterminate. See PCR results.  Final    Comment: Performed at [facility], [place] [address] [place], [place] [address]  Gastrointestinal Panel by PCR , Stool     Status: None   Collection Time: 12/07/20  4:23 PM   Specimen: STOOL  Result Value Ref Range Status   Campylobacter species NOT DETECTED NOT DETECTED Final   Plesimonas shigelloides NOT DETECTED NOT DETECTED Final   Salmonella species NOT DETECTED NOT DETECTED Final   Yersinia enterocolitica NOT DETECTED NOT DETECTED Final   Vibrio species NOT DETECTED NOT DETECTED Final   Vibrio cholerae NOT DETECTED NOT DETECTED Final   Enteroaggregative E coli  (EAEC) NOT DETECTED NOT DETECTED Final   Enteropathogenic E coli (EPEC) NOT DETECTED NOT DETECTED Final   Enterotoxigenic E coli (ETEC) NOT DETECTED NOT DETECTED Final   Shiga like toxin producing E coli (STEC) NOT DETECTED NOT DETECTED Final   Shigella/Enteroinvasive E coli (EIEC) NOT DETECTED NOT DETECTED Final   Cryptosporidium NOT DETECTED NOT DETECTED Final   Cyclospora cayetanensis NOT DETECTED NOT DETECTED Final   Entamoeba histolytica NOT DETECTED NOT DETECTED Final   Giardia lamblia NOT DETECTED NOT  DETECTED Final   Adenovirus F40/41 NOT DETECTED NOT DETECTED Final   Astrovirus NOT DETECTED NOT DETECTED Final   Norovirus GI/GII NOT DETECTED NOT DETECTED Final   Rotavirus A NOT DETECTED NOT DETECTED Final   Sapovirus (I, II, IV, and V) NOT DETECTED NOT DETECTED Final    Comment: Performed at [facility], [address] [place], [place] [address]  C. Diff by PCR, Reflexed     Status: Abnormal   Collection Time: 12/07/20  4:23 PM  Result Value Ref Range Status   Toxigenic C. Difficile by PCR POSITIVE (A) NEGATIVE Final    Comment: Positive for toxigenic C. difficile with little to no toxin production. Only treat if clinical presentation suggests symptomatic illness. Performed at [facility], [place] [address] [place], [place] [address]          Radiology Studies: ECHOCARDIOGRAM COMPLETE  Result Date: 12/09/2020    ECHOCARDIOGRAM REPORT   Patient Name:   Hayden Mckinney Date of Exam: 12/09/2020 Medical Rec #:  [id number]        Height:       69.0 in Accession #:    [id number]       Weight:       148.1 lb Date of Birth:  08-May-1945       BSA:          1.819 m Patient Age:    65 years         BP:           160/75 mmHg Patient Gender: M                HR:           62 bpm. Exam Location:  Inpatient Procedure: 2D Echo, Color Doppler and Cardiac Doppler Indications:    Stroke i63.9  History:        Patient has prior history of Echocardiogram examinations, most                  recent 04/21/2014. Risk Factors:Hypertension, Diabetes,                 Dyslipidemia and ETOH.  Sonographer:    Raquel Sarna Senior RDCS Referring Phys: [place]  1. Left ventricular ejection fraction, by estimation, is 60 to 65%. The left ventricle has normal function. The left ventricle has no regional wall motion abnormalities. There is mild left ventricular hypertrophy. Left ventricular diastolic parameters are indeterminate.  2. Right ventricular systolic function is normal. The right ventricular size is normal. Tricuspid regurgitation signal is inadequate for assessing PA pressure.  3. Left atrial size was mildly dilated.  4. The mitral valve is normal in structure. Trivial mitral valve regurgitation. No evidence of mitral stenosis.  5. The aortic valve is tricuspid. Aortic valve regurgitation is not visualized. Aortic valve sclerosis/calcification is present, without any evidence of aortic stenosis. FINDINGS  Left Ventricle: Left ventricular ejection fraction, by estimation, is 60 to 65%. The left ventricle has normal function. The left ventricle has no regional wall motion abnormalities. The left ventricular internal cavity size was normal in size. There is  mild left ventricular hypertrophy. Left ventricular diastolic parameters are indeterminate. Right Ventricle: The right ventricular size is normal. No increase in right ventricular wall thickness. Right ventricular systolic function is normal. Tricuspid regurgitation signal is inadequate for assessing PA pressure. Left Atrium: Left atrial size was mildly dilated. Right Atrium: Right atrial size was normal in size.  Pericardium: Trivial pericardial effusion is present. Presence of epicardial fat layer. Mitral Valve: The mitral valve is normal in structure. Trivial mitral valve regurgitation. No evidence of mitral valve stenosis. Tricuspid Valve: The tricuspid valve is normal in structure. Tricuspid valve regurgitation is trivial.  Aortic Valve: The aortic valve is tricuspid. Aortic valve regurgitation is not visualized. Aortic valve sclerosis/calcification is present, without any evidence of aortic stenosis. Pulmonic Valve: The pulmonic valve was not well visualized. Pulmonic valve regurgitation is not visualized. Aorta: The aortic root and ascending aorta are structurally normal, with no evidence of dilitation. IAS/Shunts: The interatrial septum was not well visualized.  LEFT VENTRICLE PLAX 2D LVIDd:         4.40 cm   Diastology LVIDs:         2.50 cm   LV e' medial:    7.45 cm/s LV PW:         1.10 cm   LV E/e' medial:  9.6 LV IVS:        0.80 cm   LV e' lateral:   7.53 cm/s LVOT diam:     2.10 cm   LV E/e' lateral: 9.5 LV SV:         74 LV SV Index:   41 LVOT Area:     3.46 cm  RIGHT VENTRICLE RV S prime:     11.40 cm/s TAPSE (M-mode): 1.8 cm LEFT ATRIUM             Index        RIGHT ATRIUM           Index LA diam:        3.70 cm 2.03 cm/m   RA Area:     12.70 cm LA Vol (A2C):   65.5 ml 36.02 ml/m  RA Volume:   23.40 ml  12.87 ml/m LA Vol (A4C):   54.9 ml 30.19 ml/m LA Biplane Vol: 61.7 ml 33.93 ml/m  AORTIC VALVE LVOT Vmax:   98.70 cm/s LVOT Vmean:  63.200 cm/s LVOT VTI:    0.215 m  AORTA Ao Root diam: 3.00 cm Ao Asc diam:  3.50 cm MITRAL VALVE MV Area (PHT): 2.22 cm    SHUNTS MV Decel Time: 342 msec    Systemic VTI:  0.22 m MV E velocity: 71.70 cm/s  Systemic Diam: 2.10 cm MV A velocity: 76.10 cm/s MV E/A ratio:  0.94 Oswaldo Milian MD Electronically signed by Oswaldo Milian MD Signature Date/Time: 12/09/2020/5:25:41 PM    Final         Scheduled Meds:   stroke: mapping our early stages of recovery book   Does not apply Once   aspirin EC  81 mg Oral Daily   atorvastatin  80 mg Oral q1800   carvedilol  6.25 mg Oral BID WC   Chlorhexidine Gluconate Cloth  6 each Topical Daily   escitalopram  10 mg Oral Daily   famotidine  20 mg Oral Daily   feeding supplement  237 mL Oral TID BM   heparin  5,000 Units  Subcutaneous Q8H   insulin aspart  0-9 Units Subcutaneous TID WC   tamsulosin  0.4 mg Oral Daily   ticagrelor  90 mg Oral BID   vancomycin  125 mg Oral QID   Continuous Infusions:  lactated ringers 50 mL/hr at 12/11/20 0816     LOS: 4 days       Phillips Climes, MD [facility]   To contact the attending provider between 7A-7P or  the covering provider during after hours 7P-7A, please log into the web site [email or web address] and access using universal [address] password for that web site. If you do not have the password, please call the hospital operator.  12/11/2020, 11:17 AM

## 2020-12-12 LAB — GLUCOSE, CAPILLARY
Glucose-Capillary: 114 mg/dL — ABNORMAL HIGH (ref 70–99)
Glucose-Capillary: 135 mg/dL — ABNORMAL HIGH (ref 70–99)
Glucose-Capillary: 121 mg/dL — ABNORMAL HIGH (ref 70–99)
Glucose-Capillary: 128 mg/dL — ABNORMAL HIGH (ref 70–99)

## 2020-12-12 LAB — CULTURE, BLOOD (ROUTINE X 2): Culture: NO GROWTH

## 2020-12-12 MED ORDER — AMLODIPINE BESYLATE 5 MG PO TABS: 5.0000 mg | ORAL_TABLET | Freq: Every day | ORAL | Status: DC

## 2020-12-12 MED ORDER — HYDRALAZINE HCL 25 MG PO TABS
25.0000 mg | ORAL_TABLET | Freq: Three times a day (TID) | ORAL | Status: DC
  Administered 2020-12-12: 25 mg via ORAL
  Filled 2020-12-12: qty 1

## 2020-12-12 NOTE — Progress Notes (Addendum)
Physical Therapy Treatment Patient Details Name: Hayden Mckinney MRN: [id number] DOB: [birth year] Today's Date: 12/12/2020   History of Present Illness Pt is a 75 y.o. male who presented 12/07/20 from [facility] with AMS.  MRI revealed 6 mm acute infarct within the medial left midbrain and 7 mm acute infarct within the left pons. EEG suggestive of moderate diffuse encephalopathy, nonspecific etiology. No seizures or epileptiform discharges were seen throughout the recording. PMH: CVA, dementia, diabetic peripheral neuropathy, alcohol abuse, hyperlipidemia, hypertension, sarcoma, stroke, TIA, type 2 diabetes mellitus    PT Comments    Patient completely cooperative and attempted all tasks as requested of him. He was able to stand with more upright posture, however remains limited by Rt ankle contracture and left knee contracture. He tolerated stretching well and left in bed with bil LEs supported at distal calves and knees in extensor stretch.     Recommendations for follow up therapy are one component of a multi-disciplinary discharge planning process, led by the attending physician.  Recommendations may be updated based on patient status, additional functional criteria and insurance authorization.  Follow Up Recommendations  Skilled nursing-short term rehab (<3 hours/day)     Assistance Recommended at Discharge Frequent or constant Supervision/Assistance  Equipment Recommendations  Other (comment) (defer to next venue of care)    Recommendations for Other Services       Precautions / Restrictions Precautions Precautions: Fall;Other (comment) Precaution Comments: enteric precautions, hx of prior CVA with R sided deficits, bil knee extension and ankle contractures     Mobility  Bed Mobility Overal bed mobility: Needs Assistance Bed Mobility: Supine to Sit Rolling: Min assist (to right)   Supine to sit: HOB elevated;Mod assist;+2 for physical assistance Sit to supine: HOB  elevated;Mod assist;+2 for physical assistance   General bed mobility comments: pt able to assist with moving legs off EOB and then also with raising torso to sitting; on return required assist with torso and LEs    Transfers Overall transfer level: Needs assistance Equipment used:  (face to face 2 person assist using pad under pelvis) Transfers: Sit to/from Stand Sit to Stand: Max assist;+2 physical assistance;From elevated surface           General transfer comment: attempted x 2 achieving an upright posture except for contractures in knees.    Ambulation/Gait               General Gait Details: Unable   Stairs             Wheelchair Mobility    Modified Rankin (Stroke Patients Only) Modified Rankin (Stroke Patients Only) Pre-Morbid Rankin Score: Moderately severe disability Modified Rankin: Severe disability     Balance Overall balance assessment: Needs assistance Sitting-balance support: Single extremity supported;Feet supported Sitting balance-Leahy Scale: Poor Sitting balance - Comments: UE support and minA to prevent LOB sitting statically EOB. Postural control: Posterior lean Standing balance support: Bilateral upper extremity supported Standing balance-Leahy Scale: Zero Standing balance comment: MaxA and bil UE support with bil knees blocked to stand partially upright briefly.                            Cognition Arousal/Alertness: Awake/alert Behavior During Therapy: Flat affect Overall Cognitive Status: Difficult to assess (Due to speech and language impairments)  General Comments: pt able to follow simple commands        Exercises General Exercises - Lower Extremity Ankle Circles/Pumps: PROM;Both;10 reps (followed by heelcord stretch) Short Arc Quad: AROM;Both;10 reps Long Arc Quad: AROM;Both;10 reps Heel Slides: AROM;Both;5 reps (resisted extension) Hip ABduction/ADduction:  AAROM;Both;10 reps Other Exercises Other Exercises: bolsters under bil knees with hip extension/brdging x 10 reps    General Comments        Pertinent Vitals/Pain Pain Assessment: Faces Faces Pain Scale: No hurt    Home Living                          Prior Function            PT Goals (current goals can now be found in the care plan section) Acute Rehab PT Goals Patient Stated Goal: to get better PT Goal Formulation: With patient Time For Goal Achievement: 12/22/20 Potential to Achieve Goals: Fair Progress towards PT goals: Progressing toward goals    Frequency    Min 2X/week      PT Plan Current plan remains appropriate;Frequency needs to be updated    Co-evaluation              AM-PAC PT "6 Clicks" Mobility   Outcome Measure  Help needed turning from your back to your side while in a flat bed without using bedrails?: A Little Help needed moving from lying on your back to sitting on the side of a flat bed without using bedrails?: Total Help needed moving to and from a bed to a chair (including a wheelchair)?: Total Help needed standing up from a chair using your arms (e.g., wheelchair or bedside chair)?: Total Help needed to walk in hospital room?: Total Help needed climbing 3-5 steps with a railing? : Total 6 Click Score: 8    End of Session   Activity Tolerance: Patient tolerated treatment well Patient left: in bed;with call bell/phone within reach;with bed alarm set Nurse Communication: Mobility status PT Visit Diagnosis: Unsteadiness on feet (R26.81);Muscle weakness (generalized) (M62.81);Difficulty in walking, not elsewhere classified (R26.2);Other symptoms and signs involving the nervous system (R29.898)     Time: 0177-9390 PT Time Calculation (min) (ACUTE ONLY): 34 min  Charges:  $Therapeutic Exercise: 8-22 mins $Therapeutic Activity: 8-22 mins                      Arby Barrette, PT Acute Rehabilitation Services  Pager  [phone] Office [phone]    Rexanne Mano 12/12/2020, 4:15 PM

## 2020-12-12 NOTE — Progress Notes (Signed)
PROGRESS NOTE    Hayden Mckinney  QBV:[id number] DOB: [birth year] DOA: 12/07/2020 PCP: Wenda Low, MD   Chief Complaint  Patient presents with   Altered Mental Status    Brief Narrative:   Hayden Mckinney  is a 75 y.o. male, with history of CVA, dementia, diabetic peripheral neuropathy, alcohol abuse, hyperlipidemia, hypertension, sarcoma, stroke, TIA, type 2 diabetes mellitus presents ED with a chief complaint of altered mental status.  Unfortunately the facility from which patient is from could not be reached.  It is reported that patient has ambulatory baseline, with minimal assistance with ADLs.  EMS reports that patient is totally confused at their arrival.  It is unclear what time the symptoms started.  At arrival into the ED, patient did start to have copious amounts of diarrhea.  On my exam the diarrhea appears green liquid.  Patient was recently discharged from the hospital in October 18, and there was no note of continued antibiotics after that stay.  He had been on doxycycline and Bactrim in the past, those were not continued at discharge.  Is not reported if patient had any fevers at the facility.  He is afebrile upon arrival into the ED.      Assessment & Plan:   Principal Problem:   Acute metabolic encephalopathy Active Problems:   Type II diabetes mellitus with neurological manifestations (HCC)   HTN (hypertension)   Hyperlipidemia   Severe protein-calorie malnutrition [name]: less than 60% of standard weight) (HCC)   Diarrhea   Cerebral embolism with cerebral infarction  Acute metabolic encephalopathy -MRI brain significant for acute CVA -We will likely in setting of infectious process diarrhea and dehydration contributing as well. -No evidence of UTI or infectious etiology on chest x-ray. -Patient has started to improve.  Acute CVA -MRI brain significant for acute infarct within the medial left midbrain, and left pons, -He is already on aspirin, Plavix and  statin  -Management per neurology, neuro recommend aspirin 81 and Brilinta 90 twice daily for 30 days and then Plavix alone.  Continue Lipitor 80.  Hypertension -Allow for permissive hypertension , will work on lowering blood pressure gradually, he is currently back on his home dose Coreg, will add low-dose hydralazine today as well, will keep on as needed hydralazine.    C. difficile diarrhea -Continue with oral vancomycin . -[place] patient was on Rocephin at SNF.   Type 2 diabetes mellitus -Insulin sliding scale  CKD stage IV -creatinine is more elevated today, continue with IV fluids, no evidence of obstruction on bladder scan. -Avoid nephrotoxic medications.  History of stroke - Continue aspirin, statin, and Brilinta   Severe protein calorie malnutrition - Continue protein shakes  Chronic lower extremity leg wounds due to PAD -Continue with wound care  Urinary retention -Foley catheter inserted 12/3, will start on cardura    DVT prophylaxis: Heparin Code Status: Full Family Communication: Unable to reach any family member as apparently the available phone number for his sister appears to be wrong number. Disposition:   Status is: Inpatient  Remains inpatient appropriate because: acute CVA       Consultants:  Neurology   Subjective:  no significant events as discussed with staff, no further diarrhea, oral intake remains poor.   Objective: Vitals:   12/11/20 2355 12/12/20 0400 12/12/20 0751 12/12/20 1143  BP: (!) 164/60 (!) 187/73 (!) 200/83 (!) 185/80  Pulse: 64 60 70 65  Resp: 15 16 14 14   Temp: 98.1 F (36.7 C) 98.2 F (36.8  C) 98 F (36.7 C) 98 F (36.7 C)  TempSrc: Axillary Oral Axillary Axillary  SpO2: 94% 96% 96% 95%    Intake/Output Summary (Last 24 hours) at 12/12/2020 1348 Last data filed at 12/12/2020 0904 Gross per 24 hour  Intake 1225.15 ml  Output 450 ml  Net 775.15 ml    There were no vitals filed for this  visit.  Examination:  Awake Alert, patient with significant dysarthria, frail , deconditioned Symmetrical Chest wall movement, Good air movement bilaterally, CTAB RRR,No Gallops,Rubs or new Murmurs, No Parasternal Heave +ve B.Sounds, Abd Soft, No tenderness, No rebound - guarding or rigidity. No Cyanosis, Clubbing or edema, No new Rash or bruise       Data Reviewed: I have personally reviewed following labs and imaging studies  CBC: Recent Labs  Lab 12/07/20 1412 12/07/20 1442 12/08/20 0303 12/09/20 0213 12/11/20 0039  WBC 11.6*  --  13.4* 11.9* 5.8  NEUTROABS 8.8*  --   --   --   --   HGB 9.7* 10.2* 10.0* 10.0* 9.3*  HCT 32.2* 30.0* 32.7* 31.2* 29.9*  MCV 92.3  --  92.6 88.1 88.7  PLT 227  --  218 237 235     Basic Metabolic Panel: Recent Labs  Lab 12/07/20 1412 12/07/20 1442 12/08/20 0303 12/09/20 0213 12/11/20 0039  NA 140 141 142 143 142  K 5.0 4.9 4.6 4.0 4.1  CL 113*  --  111 115* 114*  CO2 21*  --  17* 21* 23  GLUCOSE 89  --  131* 166* 129*  BUN 25*  --  30* 32* 25*  CREATININE 2.75*  --  3.17* 3.22* 2.63*  CALCIUM 8.0*  --  8.0* 8.0* 8.0*  MG  --   --  1.4*  --   --      GFR: CrCl cannot be calculated (Unknown ideal weight.).  Liver Function Tests: Recent Labs  Lab 12/07/20 1412 12/08/20 0303  AST 14* 17  ALT 8 9  ALKPHOS 70 67  BILITOT 0.4 1.0  PROT 5.0* 5.1*  ALBUMIN 1.9* 2.1*     CBG: Recent Labs  Lab 12/11/20 1221 12/11/20 1552 12/11/20 2217 12/12/20 0750 12/12/20 1140  GLUCAP 111* 133* 180* 114* 135*      Recent Results (from the past 240 hour(s))  Blood culture (routine x 2)     Status: None   Collection Time: 12/07/20  2:08 PM   Specimen: BLOOD  Result Value Ref Range Status   Specimen Description BLOOD RIGHT ANTECUBITAL  Final   Special Requests   Final    BOTTLES DRAWN AEROBIC AND ANAEROBIC Blood Culture results may not be optimal due to an inadequate volume of blood received in culture bottles   Culture    Final    NO GROWTH 5 DAYS Performed at [facility], [place] [address] [place], [place] [address]    Report Status 12/12/2020 FINAL  Final  Resp Panel by RT-PCR (Flu A&B, Covid)     Status: None   Collection Time: 12/07/20  2:34 PM   Specimen: Nasopharyngeal(NP) swabs in vial transport medium  Result Value Ref Range Status   SARS Coronavirus 2 by RT PCR NEGATIVE NEGATIVE Final    Comment: (NOTE) SARS-CoV-2 target nucleic acids are NOT DETECTED.  The SARS-CoV-2 RNA is generally detectable in upper respiratory specimens during the acute phase of infection. The lowest concentration of SARS-CoV-2 viral copies this assay can detect is 138 copies/mL. A negative result does not  preclude SARS-Cov-2 infection and should not be used as the sole basis for treatment or other patient management decisions. A negative result may occur with  improper specimen collection/handling, submission of specimen other than nasopharyngeal swab, presence of viral mutation(s) within the areas targeted by this assay, and inadequate number of viral copies(<138 copies/mL). A negative result must be combined with clinical observations, patient history, and epidemiological information. The expected result is Negative.  Fact Sheet for Patients:  EntrepreneurPulse.com.au  Fact Sheet for Healthcare Providers:  IncredibleEmployment.be  This test is no t yet approved or cleared by the [place] FDA and  has been authorized for detection and/or diagnosis of SARS-CoV-2 by FDA under an Emergency Use Authorization (EUA). This EUA will remain  in effect (meaning this test can be used) for the duration of the COVID-19 declaration under Section 564(b)(1) of the Act, 21 U.S.C.section 360bbb-3(b)(1), unless the authorization is terminated  or revoked sooner.       Influenza A by PCR NEGATIVE NEGATIVE Final   Influenza B by PCR NEGATIVE NEGATIVE Final    Comment: (NOTE) The  Xpert Xpress SARS-CoV-2/FLU/RSV plus assay is intended as an aid in the diagnosis of influenza from Nasopharyngeal swab specimens and should not be used as a sole basis for treatment. Nasal washings and aspirates are unacceptable for Xpert Xpress SARS-CoV-2/FLU/RSV testing.  Fact Sheet for Patients: EntrepreneurPulse.com.au  Fact Sheet for Healthcare Providers: IncredibleEmployment.be  This test is not yet approved or cleared by the [place] FDA and has been authorized for detection and/or diagnosis of SARS-CoV-2 by FDA under an Emergency Use Authorization (EUA). This EUA will remain in effect (meaning this test can be used) for the duration of the COVID-19 declaration under Section 564(b)(1) of the Act, 21 U.S.C. section 360bbb-3(b)(1), unless the authorization is terminated or revoked.  Performed at [facility], [place] [address], [place], [place] [address]   C Difficile Quick Screen w PCR reflex     Status: Abnormal   Collection Time: 12/07/20  4:23 PM   Specimen: STOOL  Result Value Ref Range Status   C Diff antigen POSITIVE (A) NEGATIVE Final    Comment: CRITICAL RESULT CALLED TO, READ BACK BY AND VERIFIED WITH: CONOR LOZER RN 12/07/2020 @1755  BY JW    C Diff toxin NEGATIVE NEGATIVE Final   C Diff interpretation Results are indeterminate. See PCR results.  Final    Comment: Performed at [facility], [place] [address] [place], [place] [address]  Gastrointestinal Panel by PCR , Stool     Status: None   Collection Time: 12/07/20  4:23 PM   Specimen: STOOL  Result Value Ref Range Status   Campylobacter species NOT DETECTED NOT DETECTED Final   Plesimonas shigelloides NOT DETECTED NOT DETECTED Final   Salmonella species NOT DETECTED NOT DETECTED Final   Yersinia enterocolitica NOT DETECTED NOT DETECTED Final   Vibrio species NOT DETECTED NOT DETECTED Final   Vibrio cholerae NOT DETECTED NOT DETECTED Final   Enteroaggregative  E coli (EAEC) NOT DETECTED NOT DETECTED Final   Enteropathogenic E coli (EPEC) NOT DETECTED NOT DETECTED Final   Enterotoxigenic E coli (ETEC) NOT DETECTED NOT DETECTED Final   Shiga like toxin producing E coli (STEC) NOT DETECTED NOT DETECTED Final   Shigella/Enteroinvasive E coli (EIEC) NOT DETECTED NOT DETECTED Final   Cryptosporidium NOT DETECTED NOT DETECTED Final   Cyclospora cayetanensis NOT DETECTED NOT DETECTED Final   Entamoeba histolytica NOT DETECTED NOT DETECTED Final   Giardia  lamblia NOT DETECTED NOT DETECTED Final   Adenovirus F40/41 NOT DETECTED NOT DETECTED Final   Astrovirus NOT DETECTED NOT DETECTED Final   Norovirus GI/GII NOT DETECTED NOT DETECTED Final   Rotavirus A NOT DETECTED NOT DETECTED Final   Sapovirus (I, II, IV, and V) NOT DETECTED NOT DETECTED Final    Comment: Performed at [facility], [address], [place], [place] [address]  C. Diff by PCR, Reflexed     Status: Abnormal   Collection Time: 12/07/20  4:23 PM  Result Value Ref Range Status   Toxigenic C. Difficile by PCR POSITIVE (A) NEGATIVE Final    Comment: Positive for toxigenic C. difficile with little to no toxin production. Only treat if clinical presentation suggests symptomatic illness. Performed at [facility], [place] [address], [place], [place] [address]           Radiology Studies: No results found.      Scheduled Meds:   stroke: mapping our early stages of recovery book   Does not apply Once   aspirin EC  81 mg Oral Daily   atorvastatin  80 mg Oral q1800   carvedilol  6.25 mg Oral BID WC   Chlorhexidine Gluconate Cloth  6 each Topical Daily   doxazosin  2 mg Oral Daily   escitalopram  10 mg Oral Daily   famotidine  20 mg Oral Daily   feeding supplement  237 mL Oral TID BM   heparin  5,000 Units Subcutaneous Q8H   hydrALAZINE  10 mg Oral Q8H   insulin aspart  0-9 Units Subcutaneous TID WC   ticagrelor  90 mg Oral BID   vancomycin  125 mg Oral QID    Continuous Infusions:  lactated ringers 50 mL/hr at 12/12/20 0902     LOS: 5 days       Phillips Climes, MD [facility]   To contact the attending provider between 7A-7P or the covering provider during after hours 7P-7A, please log into the web site [email or web address] and access using universal [place] password for that web site. If you do not have the password, please call the hospital operator.  12/12/2020, 1:48 PM

## 2020-12-12 NOTE — Progress Notes (Signed)
Speech Language Pathology Treatment: Dysphagia  Patient Details Name: Hayden Mckinney MRN: [id number] DOB: [birth year] Today's Date: 12/12/2020 Time: 4765-4650 SLP Time Calculation (min) (ACUTE ONLY): 14 min  Assessment / Plan / Recommendation Clinical Impression  Mr. Raimondo was interactive and pleasant; poor historian, even when his speech can be understood despite the significant dysarthria. Soft mitts were removed - pt required max assist for self-feeding, and had difficulty removing pudding from spoon, requiring verbal instruction to seal lips around spoon rather than waiting for clinician to scrape spoon against upper gums. Each bite of pudding required a prompt to remove material himself, which he was able to do.  Thin liquids were swallowed from a straw with cues needed to slow down/pace himself.  (Consecutive sips of water led to coughing - coughing did not occur with 1-2 sips at a time).  Recommend changing diet from full liquids to dysphagia 1; he doesn't appear to be ready to advance to a chopped diet.  SLP will continue to follow for swallowing/cognition/speech while admitted.\   HPI HPI: 75 y.o. male presented to ED from [facility] with a chief complaint of altered mental status. MRI brain 12/1: 6 mm acute infarct within the medial left midbrain. 7 mm acute infarct within the left pons. EEG 12/1: moderate diffuse encephalopath. PMHx CVA (right BG/CR from 2016), dementia, diabetic peripheral neuropathy, alcohol abuse, hyperlipidemia, hypertension, sarcoma, stroke, TIA, type 2 diabetes mellitus      SLP Plan  Continue with current plan of care      Recommendations for follow up therapy are one component of a multi-disciplinary discharge planning process, led by the attending physician.  Recommendations may be updated based on patient status, additional functional criteria and insurance authorization.    Recommendations  Diet recommendations: Dysphagia 1 (puree);Thin  liquid Liquids provided via: Cup;Straw Medication Administration: Whole meds with puree Supervision: Staff to assist with self feeding Compensations: Slow rate;Small sips/bites Postural Changes and/or Swallow Maneuvers: Seated upright 90 degrees                Oral Care Recommendations: Oral care BID Follow Up Recommendations: Skilled nursing-short term rehab (<3 hours/day) Assistance recommended at discharge: Frequent or constant Supervision/Assistance SLP Visit Diagnosis: Dysphagia, oral phase (R13.11) Plan: Continue with current plan of care                     [place]. Tivis Ringer, [place] Office number [phone] Pager [phone]   Juan Quam Laurice  12/12/2020, 4:02 PM

## 2020-12-12 NOTE — Progress Notes (Signed)
PROGRESS NOTE    Hayden Mckinney  WCB:[id number] DOB: 1945-10-02 DOA: 12/07/2020 PCP: Wenda Low, MD   Chief Complaint  Patient presents with   Altered Mental Status    Brief Narrative:   Hayden Mckinney  is a 75 y.o. male, with history of CVA, dementia, diabetic peripheral neuropathy, alcohol abuse, hyperlipidemia, hypertension, sarcoma, stroke, TIA, type 2 diabetes mellitus presents ED with a chief complaint of altered mental status.  Unfortunately the facility from which patient is from could not be reached.  It is reported that patient has ambulatory baseline, with minimal assistance with ADLs.  EMS reports that patient is totally confused at their arrival.  It is unclear what time the symptoms started.  At arrival into the ED, patient did start to have copious amounts of diarrhea.  On my exam the diarrhea appears green liquid.  Patient was recently discharged from the hospital in October 18, and there was no note of continued antibiotics after that stay.  He had been on doxycycline and Bactrim in the past, those were not continued at discharge.  Is not reported if patient had any fevers at the facility.  He is afebrile upon arrival into the ED.      Assessment & Plan:   Principal Problem:   Acute metabolic encephalopathy Active Problems:   Type II diabetes mellitus with neurological manifestations (HCC)   HTN (hypertension)   Hyperlipidemia   Severe protein-calorie malnutrition [name]: less than 60% of standard weight) (HCC)   Diarrhea   Cerebral embolism with cerebral infarction  Acute metabolic encephalopathy -MRI brain significant for acute CVA -We will likely in setting of infectious process diarrhea and dehydration contributing as well. -No evidence of UTI or infectious etiology on chest x-ray. -Patient has started to improve.  Acute CVA -MRI brain significant for acute infarct within the medial left midbrain, and left pons, -He is already on aspirin, Plavix and  statin  -Management per neurology, neuro recommend aspirin 81 and Brilinta 90 twice daily for 30 days and then Plavix alone.  Continue Lipitor 80.  Hypertension -Allow for permissive hypertension , will work on lowering blood pressure gradually, he is currently back on his home dose Coreg, will increase hydralazine which was started yesterday, meanwhile continue on as needed hydralazine. .  C. difficile diarrhea -Continue with oral vancomycin . -[place] patient was on Rocephin at SNF.   Type 2 diabetes mellitus -Insulin sliding scale  CKD stage IV -creatinine is more elevated today, continue with IV fluids, no evidence of obstruction on bladder scan. -Avoid nephrotoxic medications.  History of stroke - Continue aspirin, statin, and Brilinta   Severe protein calorie malnutrition - Continue protein shakes  Chronic lower extremity leg wounds due to PAD -Continue with wound care  Urinary retention -Foley catheter inserted 12/3, he was started on Cardura, as well will attempt voiding trial today.    DVT prophylaxis: Heparin Code Status: Full Family Communication: Unable to reach any family member as apparently the available phone number for his sister appears to be wrong number. Disposition:   Status is: Inpatient  Remains inpatient appropriate because: acute CVA       Consultants:  Neurology   Subjective:  No significant events overnight as discussed with staff. Objective: Vitals:   12/11/20 2355 12/12/20 0400 12/12/20 0751 12/12/20 1143  BP: (!) 164/60 (!) 187/73 (!) 200/83 (!) 185/80  Pulse: 64 60 70 65  Resp: 15 16 14 14   Temp: 98.1 F (36.7 C) 98.2 F (36.8 C)  98 F (36.7 C) 98 F (36.7 C)  TempSrc: Axillary Oral Axillary Axillary  SpO2: 94% 96% 96% 95%    Intake/Output Summary (Last 24 hours) at 12/12/2020 1414 Last data filed at 12/12/2020 1356 Gross per 24 hour  Intake 1325.15 ml  Output 450 ml  Net 875.15 ml   There were no vitals filed for  this visit.  Examination:  Awake Alert, significant dysarthria, frail, deconditioned, appears to be more appropriate today Symmetrical Chest wall movement, Good air movement bilaterally, CTAB RRR,No Gallops,Rubs or new Murmurs, No Parasternal Heave +ve B.Sounds, Abd Soft, No tenderness, No rebound - guarding or rigidity. No Cyanosis, Clubbing or edema,       Data Reviewed: I have personally reviewed following labs and imaging studies  CBC: Recent Labs  Lab 12/07/20 1412 12/07/20 1442 12/08/20 0303 12/09/20 0213 12/11/20 0039  WBC 11.6*  --  13.4* 11.9* 5.8  NEUTROABS 8.8*  --   --   --   --   HGB 9.7* 10.2* 10.0* 10.0* 9.3*  HCT 32.2* 30.0* 32.7* 31.2* 29.9*  MCV 92.3  --  92.6 88.1 88.7  PLT 227  --  218 237 188    Basic Metabolic Panel: Recent Labs  Lab 12/07/20 1412 12/07/20 1442 12/08/20 0303 12/09/20 0213 12/11/20 0039  NA 140 141 142 143 142  K 5.0 4.9 4.6 4.0 4.1  CL 113*  --  111 115* 114*  CO2 21*  --  17* 21* 23  GLUCOSE 89  --  131* 166* 129*  BUN 25*  --  30* 32* 25*  CREATININE 2.75*  --  3.17* 3.22* 2.63*  CALCIUM 8.0*  --  8.0* 8.0* 8.0*  MG  --   --  1.4*  --   --     GFR: CrCl cannot be calculated (Unknown ideal weight.).  Liver Function Tests: Recent Labs  Lab 12/07/20 1412 12/08/20 0303  AST 14* 17  ALT 8 9  ALKPHOS 70 67  BILITOT 0.4 1.0  PROT 5.0* 5.1*  ALBUMIN 1.9* 2.1*    CBG: Recent Labs  Lab 12/11/20 1221 12/11/20 1552 12/11/20 2217 12/12/20 0750 12/12/20 1140  GLUCAP 111* 133* 180* 114* 135*     Recent Results (from the past 240 hour(s))  Blood culture (routine x 2)     Status: None   Collection Time: 12/07/20  2:08 PM   Specimen: BLOOD  Result Value Ref Range Status   Specimen Description BLOOD RIGHT ANTECUBITAL  Final   Special Requests   Final    BOTTLES DRAWN AEROBIC AND ANAEROBIC Blood Culture results may not be optimal due to an inadequate volume of blood received in culture bottles   Culture    Final    NO GROWTH 5 DAYS Performed at [facility], [place] [address], [place], [place] [address]    Report Status 12/12/2020 FINAL  Final  Resp Panel by RT-PCR (Flu A&B, Covid)     Status: None   Collection Time: 12/07/20  2:34 PM   Specimen: Nasopharyngeal(NP) swabs in vial transport medium  Result Value Ref Range Status   SARS Coronavirus 2 by RT PCR NEGATIVE NEGATIVE Final    Comment: (NOTE) SARS-CoV-2 target nucleic acids are NOT DETECTED.  The SARS-CoV-2 RNA is generally detectable in upper respiratory specimens during the acute phase of infection. The lowest concentration of SARS-CoV-2 viral copies this assay can detect is 138 copies/mL. A negative result does not preclude SARS-Cov-2 infection and should not be used  as the sole basis for treatment or other patient management decisions. A negative result may occur with  improper specimen collection/handling, submission of specimen other than nasopharyngeal swab, presence of viral mutation(s) within the areas targeted by this assay, and inadequate number of viral copies(<138 copies/mL). A negative result must be combined with clinical observations, patient history, and epidemiological information. The expected result is Negative.  Fact Sheet for Patients:  EntrepreneurPulse.com.au  Fact Sheet for Healthcare Providers:  IncredibleEmployment.be  This test is no t yet approved or cleared by the [place] FDA and  has been authorized for detection and/or diagnosis of SARS-CoV-2 by FDA under an Emergency Use Authorization (EUA). This EUA will remain  in effect (meaning this test can be used) for the duration of the COVID-19 declaration under Section 564(b)(1) of the Act, 21 U.S.C.section 360bbb-3(b)(1), unless the authorization is terminated  or revoked sooner.       Influenza A by PCR NEGATIVE NEGATIVE Final   Influenza B by PCR NEGATIVE NEGATIVE Final    Comment: (NOTE) The  Xpert Xpress SARS-CoV-2/FLU/RSV plus assay is intended as an aid in the diagnosis of influenza from Nasopharyngeal swab specimens and should not be used as a sole basis for treatment. Nasal washings and aspirates are unacceptable for Xpert Xpress SARS-CoV-2/FLU/RSV testing.  Fact Sheet for Patients: EntrepreneurPulse.com.au  Fact Sheet for Healthcare Providers: IncredibleEmployment.be  This test is not yet approved or cleared by the [place] FDA and has been authorized for detection and/or diagnosis of SARS-CoV-2 by FDA under an Emergency Use Authorization (EUA). This EUA will remain in effect (meaning this test can be used) for the duration of the COVID-19 declaration under Section 564(b)(1) of the Act, 21 U.S.C. section 360bbb-3(b)(1), unless the authorization is terminated or revoked.  Performed at [facility], [place] [address], [place], [place] [address]   C Difficile Quick Screen w PCR reflex     Status: Abnormal   Collection Time: 12/07/20  4:23 PM   Specimen: STOOL  Result Value Ref Range Status   C Diff antigen POSITIVE (A) NEGATIVE Final    Comment: CRITICAL RESULT CALLED TO, READ BACK BY AND VERIFIED WITH: CONOR LOZER RN 12/07/2020 @1755  BY JW    C Diff toxin NEGATIVE NEGATIVE Final   C Diff interpretation Results are indeterminate. See PCR results.  Final    Comment: Performed at [facility], [place] [address] [place], [place] [address]  Gastrointestinal Panel by PCR , Stool     Status: None   Collection Time: 12/07/20  4:23 PM   Specimen: STOOL  Result Value Ref Range Status   Campylobacter species NOT DETECTED NOT DETECTED Final   Plesimonas shigelloides NOT DETECTED NOT DETECTED Final   Salmonella species NOT DETECTED NOT DETECTED Final   Yersinia enterocolitica NOT DETECTED NOT DETECTED Final   Vibrio species NOT DETECTED NOT DETECTED Final   Vibrio cholerae NOT DETECTED NOT DETECTED Final   Enteroaggregative  E coli (EAEC) NOT DETECTED NOT DETECTED Final   Enteropathogenic E coli (EPEC) NOT DETECTED NOT DETECTED Final   Enterotoxigenic E coli (ETEC) NOT DETECTED NOT DETECTED Final   Shiga like toxin producing E coli (STEC) NOT DETECTED NOT DETECTED Final   Shigella/Enteroinvasive E coli (EIEC) NOT DETECTED NOT DETECTED Final   Cryptosporidium NOT DETECTED NOT DETECTED Final   Cyclospora cayetanensis NOT DETECTED NOT DETECTED Final   Entamoeba histolytica NOT DETECTED NOT DETECTED Final   Giardia lamblia NOT DETECTED NOT DETECTED Final  Adenovirus F40/41 NOT DETECTED NOT DETECTED Final   Astrovirus NOT DETECTED NOT DETECTED Final   Norovirus GI/GII NOT DETECTED NOT DETECTED Final   Rotavirus A NOT DETECTED NOT DETECTED Final   Sapovirus (I, II, IV, and V) NOT DETECTED NOT DETECTED Final    Comment: Performed at [facility], [address], [place], [place] [address]  C. Diff by PCR, Reflexed     Status: Abnormal   Collection Time: 12/07/20  4:23 PM  Result Value Ref Range Status   Toxigenic C. Difficile by PCR POSITIVE (A) NEGATIVE Final    Comment: Positive for toxigenic C. difficile with little to no toxin production. Only treat if clinical presentation suggests symptomatic illness. Performed at [facility], [place] [address] [place], [place] [address]          Radiology Studies: No results found.      Scheduled Meds:   stroke: mapping our early stages of recovery book   Does not apply Once   aspirin EC  81 mg Oral Daily   atorvastatin  80 mg Oral q1800   carvedilol  6.25 mg Oral BID WC   Chlorhexidine Gluconate Cloth  6 each Topical Daily   doxazosin  2 mg Oral Daily   escitalopram  10 mg Oral Daily   famotidine  20 mg Oral Daily   feeding supplement  237 mL Oral TID BM   heparin  5,000 Units Subcutaneous Q8H   hydrALAZINE  10 mg Oral Q8H   insulin aspart  0-9 Units Subcutaneous TID WC   ticagrelor  90 mg Oral BID   vancomycin  125 mg Oral QID    Continuous Infusions:  lactated ringers 50 mL/hr at 12/12/20 0902     LOS: 5 days       Phillips Climes, MD [facility]   To contact the attending provider between 7A-7P or the covering provider during after hours 7P-7A, please log into the web site [email or web address] and access using universal [place] password for that web site. If you do not have the password, please call the hospital operator.  12/12/2020, 2:14 PM

## 2020-12-13 LAB — CBC
HCT: 28.3 % — ABNORMAL LOW (ref 39.0–52.0)
Hemoglobin: 8.5 g/dL — ABNORMAL LOW (ref 13.0–17.0)
MCH: 27.1 pg (ref 26.0–34.0)
MCHC: 30 g/dL (ref 30.0–36.0)
MCV: 90.1 fL (ref 80.0–100.0)
Platelets: 224 10*3/uL (ref 150–400)
RBC: 3.14 MIL/uL — ABNORMAL LOW (ref 4.22–5.81)
RDW: 13.8 % (ref 11.5–15.5)
WBC: 5.3 10*3/uL (ref 4.0–10.5)
nRBC: 0 % (ref 0.0–0.2)

## 2020-12-13 LAB — BASIC METABOLIC PANEL
Anion gap: 5 (ref 5–15)
BUN: 20 mg/dL (ref 8–23)
CO2: 23 mmol/L (ref 22–32)
Calcium: 7.9 mg/dL — ABNORMAL LOW (ref 8.9–10.3)
Chloride: 113 mmol/L — ABNORMAL HIGH (ref 98–111)
Creatinine, Ser: 2.59 mg/dL — ABNORMAL HIGH (ref 0.61–1.24)
GFR, Estimated: 25 mL/min — ABNORMAL LOW (ref >60)
Glucose, Bld: 130 mg/dL — ABNORMAL HIGH (ref 70–99)
Potassium: 3.9 mmol/L (ref 3.5–5.1)
Sodium: 141 mmol/L (ref 135–145)

## 2020-12-13 LAB — GLUCOSE, CAPILLARY
Glucose-Capillary: 107 mg/dL — ABNORMAL HIGH (ref 70–99)
Glucose-Capillary: 134 mg/dL — ABNORMAL HIGH (ref 70–99)
Glucose-Capillary: 148 mg/dL — ABNORMAL HIGH (ref 70–99)

## 2020-12-13 LAB — RESP PANEL BY RT-PCR (FLU A&B, COVID) ARPGX2
Influenza A by PCR: NEGATIVE
Influenza B by PCR: NEGATIVE
SARS Coronavirus 2 by RT PCR: NEGATIVE

## 2020-12-13 LAB — PHOSPHORUS: Phosphorus: 2.3 mg/dL — ABNORMAL LOW (ref 2.5–4.6)

## 2020-12-13 MED ORDER — CLOPIDOGREL BISULFATE 75 MG PO TABS: 75.0000 mg | ORAL_TABLET | Freq: Every day | ORAL | 0 refills | Status: AC

## 2020-12-13 MED ORDER — TICAGRELOR 90 MG PO TABS: 90.0000 mg | ORAL_TABLET | Freq: Two times a day (BID) | ORAL | Status: AC

## 2020-12-13 MED ORDER — ENSURE ENLIVE PO LIQD: 237.0000 mL | Freq: Three times a day (TID) | ORAL | 12 refills | Status: AC

## 2020-12-13 MED ORDER — TAMSULOSIN HCL 0.4 MG PO CAPS
0.8000 mg | ORAL_CAPSULE | Freq: Every day | ORAL | 0 refills | Status: AC
Start: 2020-12-13 — End: ?

## 2020-12-13 MED ORDER — VANCOMYCIN HCL 125 MG PO CAPS: 125.0000 mg | ORAL_CAPSULE | Freq: Four times a day (QID) | ORAL | 0 refills | Status: AC

## 2020-12-13 MED ORDER — ASPIRIN 81 MG PO TBEC: 81.0000 mg | DELAYED_RELEASE_TABLET | Freq: Every day | ORAL | 0 refills | Status: AC

## 2020-12-13 MED ORDER — HYDRALAZINE HCL 50 MG PO TABS: 50.0000 mg | ORAL_TABLET | Freq: Three times a day (TID) | ORAL | Status: DC

## 2020-12-13 MED ORDER — HYDRALAZINE HCL 25 MG PO TABS
25.0000 mg | ORAL_TABLET | Freq: Three times a day (TID) | ORAL | Status: DC
  Administered 2020-12-13 (×2): 25 mg via ORAL
  Filled 2020-12-13 (×2): qty 1

## 2020-12-13 NOTE — Discharge Summary (Addendum)
Physician Discharge Summary  Hayden Mckinney FTD:[id number] DOB: [birth year] DOA: 12/07/2020  PCP: Wenda Low, MD  Admit date: 12/07/2020 Discharge date: 12/13/2020  Admitted From:  ALF Disposition:  SNF    Recommendations for Outpatient Follow-up:  Please obtain BMP/CBC in one week Continue with PT/OT/SLP evaluation and advance diet as tolerated as well please encourage patient to drink supplements On 2 L nasal cannula, wean as tolerated Please have palliative to follow as an outpatient to address goals of care/CODE STATUS, Neuro recommend aspirin 81 and Brilinta 90 twice daily for 30 days and then Plavix alone.  Continue Lipitor 80.   Discharge Condition:Stable CODE STATUS:FULL Diet recommendation: DYS1 with thin liquid  Brief/Interim Summary:  Hayden Mckinney  is a 75 y.o. male, with history of CVA, dementia, diabetic peripheral neuropathy, alcohol abuse, hyperlipidemia, hypertension, sarcoma, stroke, TIA, type 2 diabetes mellitus presents ED with a chief complaint of altered mental status.  Unfortunately the facility from which patient is from could not be reached.  It is reported that patient has ambulatory baseline, with minimal assistance with ADLs.  EMS reports that patient is totally confused at their arrival.  It is unclear what time the symptoms started.  At arrival into the ED, patient did start to have copious amounts of diarrhea.  On my exam the diarrhea appears green liquid.  Patient was recently discharged from the hospital in October 18, and there was no note of continued antibiotics after that stay.  He had been on doxycycline and Bactrim in the past, those were not continued at discharge.  Is not reported if patient had any fevers at the facility.  He is afebrile upon arrival into the ED.    Acute metabolic encephalopathy -MRI brain significant for acute CVA -We will likely in setting of infectious process diarrhea and dehydration contributing as well. -No evidence  of UTI or infectious etiology on chest x-ray. -Patient Hayden Mckinney has started to improve.   Acute CVA -MRI brain significant for acute infarct within the medial left midbrain, and left pons, -He is already on aspirin, Plavix and statin  -Management per neurology, neuro recommend aspirin 81 and Brilinta 90 twice daily for 30 days and then Plavix alone.  Continue Lipitor 80.   Hypertension -Allow for permissive hypertensive initially, blood pressure significantly elevated, gradually resume his home medications, today's day 6 for him, so he can be resumed on his home regimen including Coreg, and BiDil .    C. difficile diarrhea -Continue with oral vancomycin .  No further diarrhea, to finish 10 days course of vancomycin (4 days left as an outpatient). - patient was on Rocephin at SNF.   Type 2 diabetes mellitus -Resume home regimen   CKD stage IV -At baseline, avoid nephrotoxic medications   History of stroke - Continue aspirin, statin, and Brilinta, and transition from aspirin/Brilinta to Plavix in 30 days   Severe protein calorie malnutrition - Continue protein shakes   Chronic lower extremity leg wounds due to PAD -Continue with wound care   Urinary retention -Foley catheter inserted 12/3, discontinued yesterday with no evidence of recurrence, continue with Flomax.    Goals of care -Patient appears to be full code, have tried to reach family members, no available phone numbers currently, would encourage to get palliative medicine uncalled as an outpatient to address goals of care.    Discharge Diagnoses:  Principal Problem:   Acute metabolic encephalopathy Active Problems:   Type II diabetes mellitus with neurological manifestations (HCC)   HTN (  hypertension)   Hyperlipidemia   Severe protein-calorie malnutrition [name]: less than 60% of standard weight) ([place])   Diarrhea   Cerebral embolism with cerebral infarction    Discharge Instructions  Discharge Instructions      Ambulatory referral to Neurology   Complete by: As directed    Follow up with stroke clinic NP (Jessica Vanschaick or Cecille Rubin, if both not available, consider Zachery Dauer, or A[place]) at [facility] in about 4 weeks. Thanks.   Discharge instructions   Complete by: As directed    Follow with Primary MD Wenda Low, MD   Get CBC, CMP, checked  by Primary MD next visit.    Activity: As tolerated with Full fall precautions use walker/cane & assistance as needed   Disposition SNF   Diet: Dysphagia 1 with thin liquids, advance as tolerated  On your next visit with your primary care physician please Get Medicines reviewed and adjusted.   Please request your Prim.MD to go over all Hospital Tests and Procedure/Radiological results at the follow up, please get all Hospital records sent to your Prim MD by signing hospital release before you go home.   If you experience worsening of your admission symptoms, develop shortness of breath, life threatening emergency, suicidal or homicidal thoughts you must seek medical attention immediately by calling 911 or calling your MD immediately  if symptoms less severe.  You Must read complete instructions/literature along with all the possible adverse reactions/side effects for all the Medicines you take and that have been prescribed to you. Take any new Medicines after you have completely understood and accpet all the possible adverse reactions/side effects.   Do not drive, operating heavy machinery, perform activities at heights, swimming or participation in water activities or provide baby sitting services if your were admitted for syncope or siezures until you have seen by Primary MD or a Neurologist and advised to do so again.  Do not drive when taking Pain medications.    Do not take more than prescribed Pain, Sleep and Anxiety Medications  Special Instructions: If you have smoked or chewed Tobacco  in the last 2 yrs please stop smoking,  stop any regular Alcohol  and or any Recreational drug use.  Wear Seat belts while driving.   Please note  You were cared for by a hospitalist during your hospital stay. If you have any questions about your discharge medications or the care you received while you were in the hospital after you are discharged, you can call the unit and asked to speak with the hospitalist on call if the hospitalist that took care of you is not available. Once you are discharged, your primary care physician will handle any further medical issues. Please note that NO REFILLS for any discharge medications will be authorized once you are discharged, as it is imperative that you return to your primary care physician (or establish a relationship with a primary care physician if you do not have one) for your aftercare needs so that they can reassess your need for medications and monitor your lab values.   Increase activity slowly   Complete by: As directed    No wound care   Complete by: As directed       Allergies as of 12/13/2020   No Known Allergies      Medication List     STOP taking these medications    cefTRIAXone  IVPB Commonly known as: ROCEPHIN       TAKE these medications  acetaminophen 325 MG tablet Commonly known as: TYLENOL Take 2 tablets (650 mg total) by mouth every 6 (six) hours as needed for mild pain (or Fever >/= 101).   aspirin 81 MG EC tablet Take 1 tablet (81 mg total) by mouth daily for 27 days. Swallow whole.  Last dose 01/09/2021, as Plavix will be started 01/10/2021. What changed: additional instructions   atorvastatin 80 MG tablet Commonly known as: LIPITOR Take 1 tablet (80 mg total) by mouth daily at 6 PM.   bisacodyl 10 MG suppository Commonly known as: DULCOLAX Place 1 suppository (10 mg total) rectally daily as needed for moderate constipation.   carvedilol 6.25 MG tablet Commonly known as: COREG Take 1 tablet (6.25 mg total) by mouth 2 (two) times daily with a  meal.   clopidogrel 75 MG tablet Commonly known as: PLAVIX Take 1 tablet (75 mg total) by mouth daily. Start on 01/10/2021 after aspirin and Brilinta has been stopped day before. Start taking on: January 10, 2021 What changed:  additional instructions These instructions start on January 10, 2021. If you are unsure what to do until then, ask your doctor or other care provider.   docusate sodium 100 MG capsule Commonly known as: COLACE Take 1 capsule (100 mg total) by mouth 2 (two) times daily.   escitalopram 10 MG tablet Commonly known as: LEXAPRO Take 1 tablet (10 mg total) by mouth daily.   famotidine 20 MG tablet Commonly known as: PEPCID Take 1 tablet (20 mg total) by mouth daily. What changed: Another medication with the same name was removed. Continue taking this medication, and follow the directions you see here.   feeding supplement Liqd Take 237 mLs by mouth 3 (three) times daily between meals.   gentamicin ointment 0.1 % Commonly known as: GARAMYCIN Apply 1 application topically See admin instructions. Cleanse right shin wound with wound cleanser,apply ointment to wound,apply silver AG and cover with DPD x 2 weeks   glimepiride 1 MG tablet Commonly known as: AMARYL Take 1 tablet (1 mg total) by mouth daily with breakfast.   hydrOXYzine 25 MG tablet Commonly known as: ATARAX Take 25 mg by mouth at bedtime.   Iron High-Potency 142 (45 Fe) MG Tbcr Take 1 tablet by mouth daily.   isosorbide-hydrALAZINE 20-37.5 MG tablet Commonly known as: BIDIL Take 1 tablet by mouth 2 (two) times daily.   loperamide 2 MG capsule Commonly known as: IMODIUM Take 1 capsule (2 mg total) by mouth as needed for diarrhea or loose stools. What changed: additional instructions   multivitamin with minerals Tabs tablet Take 1 tablet by mouth daily.   NON FORMULARY Take 120 mLs by mouth 3 (three) times daily.   NovoLOG FlexPen 100 UNIT/ML FlexPen Generic drug: insulin aspart Inject  2-11 Units into the skin See admin instructions. If blood glucose is 101-150, take 2 units  151-200, take 3 units  201-250, take 5 units  251-300, take 7 units  301-350, take 9 units  350, take 11 units  Call MD if blood glucose is over 400 or under 60 What changed: additional instructions   ondansetron 4 MG tablet Commonly known as: ZOFRAN Take 4 mg by mouth every 8 (eight) hours as needed for nausea or vomiting.   pregabalin 25 MG capsule Commonly known as: LYRICA Take 25 mg by mouth 2 (two) times daily.   tamsulosin 0.4 MG Caps capsule Commonly known as: FLOMAX Take 2 capsules (0.8 mg total) by mouth daily. What changed: how much to  take   ticagrelor 90 MG Tabs tablet Commonly known as: BRILINTA Take 1 tablet (90 mg total) by mouth 2 (two) times daily for 27 days. Last dose 01/09/2021, as Plavix will be started 01/10/2021.   vancomycin 125 MG capsule Commonly known as: VANCOCIN Take 1 capsule (125 mg total) by mouth 4 (four) times daily for 4 days.   vitamin C 500 MG tablet Commonly known as: ASCORBIC ACID Take 1 tablet (500 mg total) by mouth 2 (two) times daily.   Vitamin D (Ergocalciferol) 1.25 MG (50000 UNIT) Caps capsule Commonly known as: DRISDOL Take 1 capsule (50,000 Units total) by mouth every Tuesday.        Contact information for follow-up providers     [facility]. Schedule an appointment as soon as possible for a visit in 1 month(s).   Specialty: Neurology Why: stroke clinic Contact information: [place] [phone]             Contact information for after-discharge care     Destination     [facility] .   Service: Skilled Nursing Contact information: [place] [phone]                    No Known Allergies  Consultations: neurology   Procedures/Studies: DG Chest 2  View  Result Date: 12/07/2020 CLINICAL DATA:  Altered mental status and weakness. EXAM: CHEST - 2 VIEW COMPARISON:  07/11/2020. FINDINGS: The cardiac silhouette, mediastinal and hilar contours are within normal limits and stable. Low lung volumes with streaky bibasilar atelectasis. No definite infiltrates or effusions. IMPRESSION: Low lung volumes with streaky bibasilar atelectasis. Electronically Signed   By: Marijo Sanes M.D.   On: 12/07/2020 15:10   CT HEAD WO CONTRAST (5MM)  Result Date: 12/07/2020 CLINICAL DATA:  Delirium. EXAM: CT HEAD WITHOUT CONTRAST TECHNIQUE: Contiguous axial images were obtained from the base of the skull through the vertex without intravenous contrast. COMPARISON:  Head CT dated 10/23/2020. FINDINGS: Brain: Moderate age-related atrophy and chronic microvascular ischemic changes. Right basal ganglia and right cerebellar old infarcts. There is no acute intracranial hemorrhage. No mass effect or midline shift. No extra-axial fluid collection. Vascular: No hyperdense vessel or unexpected calcification. Skull: Normal. Negative for fracture or focal lesion. Sinuses/Orbits: A 2.5 cm right maxillary sinus retention cyst or polyp. The remainder of the visualized paranasal sinuses and mastoid air cells are clear. Other: None IMPRESSION: 1. No acute intracranial pathology. 2. Moderate age-related atrophy and chronic microvascular ischemic changes. Old right basal ganglia and right cerebellar infarcts. Electronically Signed   By: Anner Crete M.D.   On: 12/07/2020 17:41   MR ANGIO HEAD WO CONTRAST  Result Date: 12/09/2020 CLINICAL DATA:  Stroke EXAM: MRA NECK WITHOUT CONTRAST MRA HEAD WITHOUT CONTRAST TECHNIQUE: Angiographic images of the Circle of Willis were acquired using MRA technique without intravenous contrast. COMPARISON:  CT angio head and neck 10/23/2020. FINDINGS: MRA NECK FINDINGS Normal aortic arch and proximal great vessels. Mild stenosis proximal right internal  carotid artery on MRA. On CTA this appears more severe, estimated 60-70% diameter stenosis. CT appears to be more accurate in this case. Left carotid bifurcation widely patent Both vertebral arteries are patent to the skull base. Left vertebral artery is dominant. MRA HEAD FINDINGS Internal carotid artery is patent bilaterally. Hypoplastic right A1 segment. Both anterior cerebral arteries supplied from the left. Moderate stenosis left A2 segment. Mild stenosis  right A2 segment. Mild stenosis distal right M1 segment. Right middle cerebral artery branches patent without significant stenosis Left M1 segment widely patent. Moderately severe stenosis left M2 segment. No large vessel occlusion. IMPRESSION: 1. Stenosis proximal right internal carotid artery. By CTA this appears to be 60-70% diameter stenosis. 2. Left vertebral artery widely patent. Both vertebral arteries patent 3. Intracranial atherosclerotic disease involving the anterior cerebral arteries bilaterally and the middle cerebral artery bilaterally. No large vessel occlusion. Electronically Signed   By: Franchot Gallo M.D.   On: 12/09/2020 13:02   MR ANGIO NECK WO CONTRAST  Result Date: 12/09/2020 CLINICAL DATA:  Stroke EXAM: MRA NECK WITHOUT CONTRAST MRA HEAD WITHOUT CONTRAST TECHNIQUE: Angiographic images of the Circle of Willis were acquired using MRA technique without intravenous contrast. COMPARISON:  CT angio head and neck 10/23/2020. FINDINGS: MRA NECK FINDINGS Normal aortic arch and proximal great vessels. Mild stenosis proximal right internal carotid artery on MRA. On CTA this appears more severe, estimated 60-70% diameter stenosis. CT appears to be more accurate in this case. Left carotid bifurcation widely patent Both vertebral arteries are patent to the skull base. Left vertebral artery is dominant. MRA HEAD FINDINGS Internal carotid artery is patent bilaterally. Hypoplastic right A1 segment. Both anterior cerebral arteries supplied from the  left. Moderate stenosis left A2 segment. Mild stenosis right A2 segment. Mild stenosis distal right M1 segment. Right middle cerebral artery branches patent without significant stenosis Left M1 segment widely patent. Moderately severe stenosis left M2 segment. No large vessel occlusion. IMPRESSION: 1. Stenosis proximal right internal carotid artery. By CTA this appears to be 60-70% diameter stenosis. 2. Left vertebral artery widely patent. Both vertebral arteries patent 3. Intracranial atherosclerotic disease involving the anterior cerebral arteries bilaterally and the middle cerebral artery bilaterally. No large vessel occlusion. Electronically Signed   By: Franchot Gallo M.D.   On: 12/09/2020 13:02   [name] CONTRAST  Result Date: 12/08/2020 CLINICAL DATA:  Stroke suspected. Additional history provided: Confusion. EXAM: MRI HEAD WITHOUT CONTRAST TECHNIQUE: Multiplanar, multiecho pulse sequences of the brain and surrounding structures were obtained without intravenous contrast. COMPARISON:  Prior head CT examinations 12/07/2020 and earlier. Brain MRI 10/24/2020. FINDINGS: Brain: The examination is intermittently motion degraded, limiting evaluation. Most notably, there is mild-to-moderate motion degradation of the sagittal T1 weighted sequence, moderate motion degradation of the axial T2 FLAIR sequence, moderate motion degradation of the axial T1 weighted sequence and moderate motion degradation of the coronal T2 TSE sequence. Mild-to-moderate generalized cerebral atrophy. Comparatively mild cerebellar atrophy. 6 mm acute infarct within the medial left midbrain (for instance as seen on series 3, image 22). 7 mm acute infarct within the left pons (for instance as seen on series 3, image 19). Redemonstrated chronic infarct within the callosal body and bilateral centrum semiovale. Redemonstrated chronic small-vessel infarcts within the bilateral basal ganglia, thalami and central brainstem at the  pontomesencephalic junction. Associated chronic hemosiderin deposition at site of a chronic small-vessel infarct within the right basal ganglia. Background chronic small vessel ischemic changes which are moderate in the cerebral white matter, and mild in the pons. Redemonstrated chronic infarcts within the right cerebellar hemisphere. No evidence of an intracranial mass. No extra-axial fluid collection. No midline shift. Vascular: Maintained flow voids within the proximal large arterial vessels. Skull and upper cervical spine: No focal suspicious marrow lesion. Sinuses/Orbits: Visualized orbits show no acute finding. Minimal mucosal thickening within the bilateral ethmoid air cells. Other: Trace fluid within the left mastoid air cells. IMPRESSION:  Motion degraded examination, as described and limiting evaluation. 6 mm acute infarct within the medial left midbrain. 7 mm acute infarct within the left pons. Otherwise stable non-contrast MRI appearance of the brain as compared to 10/24/2020, with parenchymal atrophy, chronic ischemic changes and chronic infarcts as detailed. Minimal mucosal thickening within the bilateral ethmoid air cells. Trace fluid within the left mastoid air cells. Electronically Signed   By: Kellie Simmering D.O.   On: 12/08/2020 09:16   DG Abd Portable 1V  Result Date: 12/08/2020 CLINICAL DATA:  Nausea and vomiting. EXAM: PORTABLE ABDOMEN - 1 VIEW COMPARISON:  Abdominal radiographs 07/06/2014. FINDINGS: Portions of the upper abdomen are excluded from the field of view. No dilated loops of small bowel are demonstrated. Nonspecific relative paucity of bowel gas within the abdomen. No acute bony abnormality identified. IMPRESSION: Portions of the upper abdomen are excluded from the field of view. Nonspecific relative paucity of bowel gas within the abdomen. Consider a CT abdomen/pelvis for further evaluation if there is concern for bowel obstruction. Electronically Signed   By: Kellie Simmering D.O.    On: 12/08/2020 08:16   EEG adult  Result Date: 12/08/2020 Lora Havens, MD     12/08/2020  2:31 PM Patient Name: JULUIS FITZSIMMONS MRN: [id number] Epilepsy Attending: Lora Havens Referring Physician/Provider: Rolla Plate, DO Date: 12/08/2020 Duration: 24.07 mins Patient history: 75 year old male with altered mental status.  EEG presented with seizure. Level of alertness: Awake AEDs during EEG study: None Technical aspects: This EEG study was done with scalp electrodes positioned according to the 10-20 International system of electrode placement. Electrical activity was acquired at a sampling rate of 500Hz  and reviewed with a high frequency filter of 70Hz  and a low frequency filter of 1Hz . EEG data were recorded continuously and digitally stored. Description: EEG showed continuous generalized polymorphic sharply contoured 6 Hz theta slowing admixed with intermittent generalized 2 to 3 Hz delta slowing. Hyperventilation and photic stimulation were not performed.   ABNORMALITY - Continuous slow, generalized IMPRESSION: This study is suggestive of moderate diffuse encephalopathy, nonspecific etiology. No seizures or epileptiform discharges were seen throughout the recording. Lora Havens   ECHOCARDIOGRAM COMPLETE  Result Date: 12/09/2020    ECHOCARDIOGRAM REPORT   Patient Name:   ROREY BISSON Date of Exam: 12/09/2020 Medical Rec #:  [id number]        Height:       69.0 in Accession #:    [id number]       Weight:       148.1 lb Date of Birth:  1945-05-08       BSA:          1.819 m Patient Age:    65 years         BP:           160/75 mmHg Patient Gender: M                HR:           62 bpm. Exam Location:  Inpatient Procedure: 2D Echo, Color Doppler and Cardiac Doppler Indications:    Stroke i63.9  History:        Patient has prior history of Echocardiogram examinations, most                 recent 04/21/2014. Risk Factors:Hypertension, Diabetes,                 Dyslipidemia and ETOH.   Sonographer:  Raquel Sarna Senior RDCS Referring Phys: [place]  1. Left ventricular ejection fraction, by estimation, is 60 to 65%. The left ventricle has normal function. The left ventricle has no regional wall motion abnormalities. There is mild left ventricular hypertrophy. Left ventricular diastolic parameters are indeterminate.  2. Right ventricular systolic function is normal. The right ventricular size is normal. Tricuspid regurgitation signal is inadequate for assessing PA pressure.  3. Left atrial size was mildly dilated.  4. The mitral valve is normal in structure. Trivial mitral valve regurgitation. No evidence of mitral stenosis.  5. The aortic valve is tricuspid. Aortic valve regurgitation is not visualized. Aortic valve sclerosis/calcification is present, without any evidence of aortic stenosis. FINDINGS  Left Ventricle: Left ventricular ejection fraction, by estimation, is 60 to 65%. The left ventricle has normal function. The left ventricle has no regional wall motion abnormalities. The left ventricular internal cavity size was normal in size. There is  mild left ventricular hypertrophy. Left ventricular diastolic parameters are indeterminate. Right Ventricle: The right ventricular size is normal. No increase in right ventricular wall thickness. Right ventricular systolic function is normal. Tricuspid regurgitation signal is inadequate for assessing PA pressure. Left Atrium: Left atrial size was mildly dilated. Right Atrium: Right atrial size was normal in size. Pericardium: Trivial pericardial effusion is present. Presence of epicardial fat layer. Mitral Valve: The mitral valve is normal in structure. Trivial mitral valve regurgitation. No evidence of mitral valve stenosis. Tricuspid Valve: The tricuspid valve is normal in structure. Tricuspid valve regurgitation is trivial. Aortic Valve: The aortic valve is tricuspid. Aortic valve regurgitation is not visualized. Aortic valve  sclerosis/calcification is present, without any evidence of aortic stenosis. Pulmonic Valve: The pulmonic valve was not well visualized. Pulmonic valve regurgitation is not visualized. Aorta: The aortic root and ascending aorta are structurally normal, with no evidence of dilitation. IAS/Shunts: The interatrial septum was not well visualized.  LEFT VENTRICLE PLAX 2D LVIDd:         4.40 cm   Diastology LVIDs:         2.50 cm   LV e' medial:    7.45 cm/s LV PW:         1.10 cm   LV E/e' medial:  9.6 LV IVS:        0.80 cm   LV e' lateral:   7.53 cm/s LVOT diam:     2.10 cm   LV E/e' lateral: 9.5 LV SV:         74 LV SV Index:   41 LVOT Area:     3.46 cm  RIGHT VENTRICLE RV S prime:     11.40 cm/s TAPSE (M-mode): 1.8 cm LEFT ATRIUM             Index        RIGHT ATRIUM           Index LA diam:        3.70 cm 2.03 cm/m   RA Area:     12.70 cm LA Vol (A2C):   65.5 ml 36.02 ml/m  RA Volume:   23.40 ml  12.87 ml/m LA Vol (A4C):   54.9 ml 30.19 ml/m LA Biplane Vol: 61.7 ml 33.93 ml/m  AORTIC VALVE LVOT Vmax:   98.70 cm/s LVOT Vmean:  63.200 cm/s LVOT VTI:    0.215 m  AORTA Ao Root diam: 3.00 cm Ao Asc diam:  3.50 cm MITRAL VALVE MV Area (PHT): 2.22 cm    SHUNTS  MV Decel Time: 342 msec    Systemic VTI:  0.22 m MV E velocity: 71.70 cm/s  Systemic Diam: 2.10 cm MV A velocity: 76.10 cm/s MV E/A ratio:  0.94 Oswaldo Milian MD Electronically signed by Oswaldo Milian MD Signature Date/Time: 12/09/2020/5:25:41 PM    Final       Subjective: Foley catheter discontinued yesterday, no evidence of recurrence of retention, has urinated multiple times, bladder scans done twice already with no evidence of retention, patient had good lunch today. -Patient just voided 300 cc, and her bladder scan done around 4:30 PM which was at 42 ML  Discharge Exam: Vitals:   12/13/20 0744 12/13/20 1147  BP: (!) 193/92 (!) 153/66  Pulse: 67 65  Resp: 16 16  Temp: 97.7 F (36.5 C) 98.1 F (36.7 C)  SpO2: 94% 92%    Vitals:   12/12/20 2329 12/13/20 0400 12/13/20 0744 12/13/20 1147  BP: (!) 168/66 (!) 190/79 (!) 193/92 (!) 153/66  Pulse: 70 67 67 65  Resp: 15 11 16 16   Temp: 98.9 F (37.2 C) 98.6 F (37 C) 97.7 F (36.5 C) 98.1 F (36.7 C)  TempSrc: Oral Oral Oral Oral  SpO2: 94% 95% 94% 92%    Awake Alert, significant dysarthria, frail, deconditioned, appears to be more appropriate today Symmetrical Chest wall movement, Good air movement bilaterally, CTAB RRR,No Gallops,Rubs or new Murmurs, No Parasternal Heave +ve B.Sounds, Abd Soft, No tenderness, No rebound - guarding or rigidity. No Cyanosis, Clubbing or edema,     The results of significant diagnostics from this hospitalization (including imaging, microbiology, ancillary and laboratory) are listed below for reference.     Microbiology: Recent Results (from the past 240 hour(s))  Blood culture (routine x 2)     Status: None   Collection Time: 12/07/20  2:08 PM   Specimen: BLOOD  Result Value Ref Range Status   Specimen Description BLOOD RIGHT ANTECUBITAL  Final   Special Requests   Final    BOTTLES DRAWN AEROBIC AND ANAEROBIC Blood Culture results may not be optimal due to an inadequate volume of blood received in culture bottles   Culture   Final    NO GROWTH 5 DAYS Performed at [facility], [place] [address], [place], [place] [address]    Report Status 12/12/2020 FINAL  Final  Resp Panel by RT-PCR (Flu A&B, Covid)     Status: None   Collection Time: 12/07/20  2:34 PM   Specimen: Nasopharyngeal(NP) swabs in vial transport medium  Result Value Ref Range Status   SARS Coronavirus 2 by RT PCR NEGATIVE NEGATIVE Final    Comment: (NOTE) SARS-CoV-2 target nucleic acids are NOT DETECTED.  The SARS-CoV-2 RNA is generally detectable in upper respiratory specimens during the acute phase of infection. The lowest concentration of SARS-CoV-2 viral copies this assay can detect is 138 copies/mL. A negative result does not preclude  SARS-Cov-2 infection and should not be used as the sole basis for treatment or other patient management decisions. A negative result may occur with  improper specimen collection/handling, submission of specimen other than nasopharyngeal swab, presence of viral mutation(s) within the areas targeted by this assay, and inadequate number of viral copies(<138 copies/mL). A negative result must be combined with clinical observations, patient history, and epidemiological information. The expected result is Negative.  Fact Sheet for Patients:  EntrepreneurPulse.com.au  Fact Sheet for Healthcare Providers:  IncredibleEmployment.be  This test is no t yet approved or cleared by the [place] and  has been authorized for detection and/or diagnosis of SARS-CoV-2 by FDA under an Emergency Use Authorization (EUA). This EUA will remain  in effect (meaning this test can be used) for the duration of the COVID-19 declaration under Section 564(b)(1) of the Act, 21 U.S.C.section 360bbb-3(b)(1), unless the authorization is terminated  or revoked sooner.       Influenza A by PCR NEGATIVE NEGATIVE Final   Influenza B by PCR NEGATIVE NEGATIVE Final    Comment: (NOTE) The Xpert Xpress SARS-CoV-2/FLU/RSV plus assay is intended as an aid in the diagnosis of influenza from Nasopharyngeal swab specimens and should not be used as a sole basis for treatment. Nasal washings and aspirates are unacceptable for Xpert Xpress SARS-CoV-2/FLU/RSV testing.  Fact Sheet for Patients: EntrepreneurPulse.com.au  Fact Sheet for Healthcare Providers: IncredibleEmployment.be  This test is not yet approved or cleared by the [place] FDA and has been authorized for detection and/or diagnosis of SARS-CoV-2 by FDA under an Emergency Use Authorization (EUA). This EUA will remain in effect (meaning this test can be used) for the duration of  the COVID-19 declaration under Section 564(b)(1) of the Act, 21 U.S.C. section 360bbb-3(b)(1), unless the authorization is terminated or revoked.  Performed at [facility], [place] [address], [place], [place] [address]   C Difficile Quick Screen w PCR reflex     Status: Abnormal   Collection Time: 12/07/20  4:23 PM   Specimen: STOOL  Result Value Ref Range Status   C Diff antigen POSITIVE (A) NEGATIVE Final    Comment: CRITICAL RESULT CALLED TO, READ BACK BY AND VERIFIED WITH: CONOR LOZER RN 12/07/2020 @1755  BY JW    C Diff toxin NEGATIVE NEGATIVE Final   C Diff interpretation Results are indeterminate. See PCR results.  Final    Comment: Performed at [facility], [place] [address] [place], [place] [address]  Gastrointestinal Panel by PCR , Stool     Status: None   Collection Time: 12/07/20  4:23 PM   Specimen: STOOL  Result Value Ref Range Status   Campylobacter species NOT DETECTED NOT DETECTED Final   Plesimonas shigelloides NOT DETECTED NOT DETECTED Final   Salmonella species NOT DETECTED NOT DETECTED Final   Yersinia enterocolitica NOT DETECTED NOT DETECTED Final   Vibrio species NOT DETECTED NOT DETECTED Final   Vibrio cholerae NOT DETECTED NOT DETECTED Final   Enteroaggregative E coli (EAEC) NOT DETECTED NOT DETECTED Final   Enteropathogenic E coli (EPEC) NOT DETECTED NOT DETECTED Final   Enterotoxigenic E coli (ETEC) NOT DETECTED NOT DETECTED Final   Shiga like toxin producing E coli (STEC) NOT DETECTED NOT DETECTED Final   Shigella/Enteroinvasive E coli (EIEC) NOT DETECTED NOT DETECTED Final   Cryptosporidium NOT DETECTED NOT DETECTED Final   Cyclospora cayetanensis NOT DETECTED NOT DETECTED Final   Entamoeba histolytica NOT DETECTED NOT DETECTED Final   Giardia lamblia NOT DETECTED NOT DETECTED Final   Adenovirus F40/41 NOT DETECTED NOT DETECTED Final   Astrovirus NOT DETECTED NOT DETECTED Final   Norovirus GI/GII NOT DETECTED NOT DETECTED Final   Rotavirus  A NOT DETECTED NOT DETECTED Final   Sapovirus (I, II, IV, and V) NOT DETECTED NOT DETECTED Final    Comment: Performed at  [facility], [place] [place], [place] [address]  C. Diff by PCR, Reflexed     Status: Abnormal   Collection Time: 12/07/20  4:23 PM  Result Value Ref Range Status   Toxigenic C. Difficile by PCR POSITIVE (A)  NEGATIVE Final    Comment: Positive for toxigenic C. difficile with little to no toxin production. Only treat if clinical presentation suggests symptomatic illness. Performed at [facility], [place] [address], [place], [place] [address]   Resp Panel by RT-PCR (Flu A&B, Covid) Nasopharyngeal Swab     Status: None   Collection Time: 12/13/20 10:43 AM   Specimen: Nasopharyngeal Swab; Nasopharyngeal(NP) swabs in vial transport medium  Result Value Ref Range Status   SARS Coronavirus 2 by RT PCR NEGATIVE NEGATIVE Final    Comment: (NOTE) SARS-CoV-2 target nucleic acids are NOT DETECTED.  The SARS-CoV-2 RNA is generally detectable in upper respiratory specimens during the acute phase of infection. The lowest concentration of SARS-CoV-2 viral copies this assay can detect is 138 copies/mL. A negative result does not preclude SARS-Cov-2 infection and should not be used as the sole basis for treatment or other patient management decisions. A negative result may occur with  improper specimen collection/handling, submission of specimen other than nasopharyngeal swab, presence of viral mutation(s) within the areas targeted by this assay, and inadequate number of viral copies(<138 copies/mL). A negative result must be combined with clinical observations, patient history, and epidemiological information. The expected result is Negative.  Fact Sheet for Patients:  EntrepreneurPulse.com.au  Fact Sheet for Healthcare Providers:  IncredibleEmployment.be  This test is no t yet approved or cleared by the [place]  FDA and  has been authorized for detection and/or diagnosis of SARS-CoV-2 by FDA under an Emergency Use Authorization (EUA). This EUA will remain  in effect (meaning this test can be used) for the duration of the COVID-19 declaration under Section 564(b)(1) of the Act, 21 U.S.C.section 360bbb-3(b)(1), unless the authorization is terminated  or revoked sooner.       Influenza A by PCR NEGATIVE NEGATIVE Final   Influenza B by PCR NEGATIVE NEGATIVE Final    Comment: (NOTE) The Xpert Xpress SARS-CoV-2/FLU/RSV plus assay is intended as an aid in the diagnosis of influenza from Nasopharyngeal swab specimens and should not be used as a sole basis for treatment. Nasal washings and aspirates are unacceptable for Xpert Xpress SARS-CoV-2/FLU/RSV testing.  Fact Sheet for Patients: EntrepreneurPulse.com.au  Fact Sheet for Healthcare Providers: IncredibleEmployment.be  This test is not yet approved or cleared by the [place] FDA and has been authorized for detection and/or diagnosis of SARS-CoV-2 by FDA under an Emergency Use Authorization (EUA). This EUA will remain in effect (meaning this test can be used) for the duration of the COVID-19 declaration under Section 564(b)(1) of the Act, 21 U.S.C. section 360bbb-3(b)(1), unless the authorization is terminated or revoked.  Performed at [facility], [place] [address] [place], [place] [address]      Labs: BNP (last 3 results) No results for input(s): BNP in the last 8760 hours. Basic Metabolic Panel: Recent Labs  Lab 12/07/20 1412 12/07/20 1442 12/08/20 0303 12/09/20 0213 12/11/20 0039 12/13/20 0030  NA 140 141 142 143 142 141  K 5.0 4.9 4.6 4.0 4.1 3.9  CL 113*  --  111 115* 114* 113*  CO2 21*  --  17* 21* 23 23  GLUCOSE 89  --  131* 166* 129* 130*  BUN 25*  --  30* 32* 25* 20  CREATININE 2.75*  --  3.17* 3.22* 2.63* 2.59*  CALCIUM 8.0*  --  8.0* 8.0* 8.0* 7.9*  MG  --   --  1.4*   --   --   --   PHOS  --   --   --   --   --  2.3*   Liver Function Tests: Recent Labs  Lab 12/07/20 1412 12/08/20 0303  AST 14* 17  ALT 8 9  ALKPHOS 70 67  BILITOT 0.4 1.0  PROT 5.0* 5.1*  ALBUMIN 1.9* 2.1*   Recent Labs  Lab 12/07/20 1412  LIPASE 21   Recent Labs  Lab 12/07/20 1415  AMMONIA 20   CBC: Recent Labs  Lab 12/07/20 1412 12/07/20 1442 12/08/20 0303 12/09/20 0213 12/11/20 0039 12/13/20 0030  WBC 11.6*  --  13.4* 11.9* 5.8 5.3  NEUTROABS 8.8*  --   --   --   --   --   HGB 9.7* 10.2* 10.0* 10.0* 9.3* 8.5*  HCT 32.2* 30.0* 32.7* 31.2* 29.9* 28.3*  MCV 92.3  --  92.6 88.1 88.7 90.1  PLT 227  --  218 237 235 224   Cardiac Enzymes: No results for input(s): CKTOTAL, CKMB, CKMBINDEX, TROPONINI in the last 168 hours. BNP: Invalid input(s): POCBNP CBG: Recent Labs  Lab 12/12/20 1140 12/12/20 1609 12/12/20 2153 12/13/20 0748 12/13/20 1150  GLUCAP 135* 121* 128* 107* 134*   D-Dimer No results for input(s): DDIMER in the last 72 hours. Hgb A1c No results for input(s): HGBA1C in the last 72 hours. Lipid Profile No results for input(s): CHOL, HDL, LDLCALC, TRIG, CHOLHDL, LDLDIRECT in the last 72 hours. Thyroid function studies No results for input(s): TSH, T4TOTAL, T3FREE, THYROIDAB in the last 72 hours.  Invalid input(s): FREET3 Anemia work up No results for input(s): VITAMINB12, FOLATE, FERRITIN, TIBC, IRON, RETICCTPCT in the last 72 hours. Urinalysis    Component Value Date/Time   COLORURINE YELLOW 12/07/2020 1415   APPEARANCEUR CLEAR 12/07/2020 1415   LABSPEC 1.025 12/07/2020 1415   PHURINE 6.0 12/07/2020 1415   GLUCOSEU 100 (A) 12/07/2020 1415   HGBUR SMALL (A) 12/07/2020 1415   BILIRUBINUR NEGATIVE 12/07/2020 1415   KETONESUR NEGATIVE 12/07/2020 1415   PROTEINUR >300 (A) 12/07/2020 1415   UROBILINOGEN 0.2 07/08/2014 0544   NITRITE NEGATIVE 12/07/2020 1415   LEUKOCYTESUR NEGATIVE 12/07/2020 1415   Sepsis Labs Invalid input(s):  PROCALCITONIN,  WBC,  LACTICIDVEN Microbiology Recent Results (from the past 240 hour(s))  Blood culture (routine x 2)     Status: None   Collection Time: 12/07/20  2:08 PM   Specimen: BLOOD  Result Value Ref Range Status   Specimen Description BLOOD RIGHT ANTECUBITAL  Final   Special Requests   Final    BOTTLES DRAWN AEROBIC AND ANAEROBIC Blood Culture results may not be optimal due to an inadequate volume of blood received in culture bottles   Culture   Final    NO GROWTH 5 DAYS Performed at [facility], [place] [address] [place], [place] [address]    Report Status 12/12/2020 FINAL  Final  Resp Panel by RT-PCR (Flu A&B, Covid)     Status: None   Collection Time: 12/07/20  2:34 PM   Specimen: Nasopharyngeal(NP) swabs in vial transport medium  Result Value Ref Range Status   SARS Coronavirus 2 by RT PCR NEGATIVE NEGATIVE Final    Comment: (NOTE) SARS-CoV-2 target nucleic acids are NOT DETECTED.  The SARS-CoV-2 RNA is generally detectable in upper respiratory specimens during the acute phase of infection. The lowest concentration of SARS-CoV-2 viral copies this assay can detect is 138 copies/mL. A negative result does not preclude SARS-Cov-2 infection and should not be used as the sole basis for treatment or other patient management decisions. A negative result may occur with  improper specimen collection/handling,  submission of specimen other than nasopharyngeal swab, presence of viral mutation(s) within the areas targeted by this assay, and inadequate number of viral copies(<138 copies/mL). A negative result must be combined with clinical observations, patient history, and epidemiological information. The expected result is Negative.  Fact Sheet for Patients:  EntrepreneurPulse.com.au  Fact Sheet for Healthcare Providers:  IncredibleEmployment.be  This test is no t yet approved or cleared by the [place] FDA and  has been  authorized for detection and/or diagnosis of SARS-CoV-2 by FDA under an Emergency Use Authorization (EUA). This EUA will remain  in effect (meaning this test can be used) for the duration of the COVID-19 declaration under Section 564(b)(1) of the Act, 21 U.S.C.section 360bbb-3(b)(1), unless the authorization is terminated  or revoked sooner.       Influenza A by PCR NEGATIVE NEGATIVE Final   Influenza B by PCR NEGATIVE NEGATIVE Final    Comment: (NOTE) The Xpert Xpress SARS-CoV-2/FLU/RSV plus assay is intended as an aid in the diagnosis of influenza from Nasopharyngeal swab specimens and should not be used as a sole basis for treatment. Nasal washings and aspirates are unacceptable for Xpert Xpress SARS-CoV-2/FLU/RSV testing.  Fact Sheet for Patients: EntrepreneurPulse.com.au  Fact Sheet for Healthcare Providers: IncredibleEmployment.be  This test is not yet approved or cleared by the [place] FDA and has been authorized for detection and/or diagnosis of SARS-CoV-2 by FDA under an Emergency Use Authorization (EUA). This EUA will remain in effect (meaning this test can be used) for the duration of the COVID-19 declaration under Section 564(b)(1) of the Act, 21 U.S.C. section 360bbb-3(b)(1), unless the authorization is terminated or revoked.  Performed at [facility], [place] [address], [place], [place] [address]   C Difficile Quick Screen w PCR reflex     Status: Abnormal   Collection Time: 12/07/20  4:23 PM   Specimen: STOOL  Result Value Ref Range Status   C Diff antigen POSITIVE (A) NEGATIVE Final    Comment: CRITICAL RESULT CALLED TO, READ BACK BY AND VERIFIED WITH: CONOR LOZER RN 12/07/2020 @1755  BY JW    C Diff toxin NEGATIVE NEGATIVE Final   C Diff interpretation Results are indeterminate. See PCR results.  Final    Comment: Performed at [facility], [place] [address], [place], [place] [address]  Gastrointestinal  Panel by PCR , Stool     Status: None   Collection Time: 12/07/20  4:23 PM   Specimen: STOOL  Result Value Ref Range Status   Campylobacter species NOT DETECTED NOT DETECTED Final   Plesimonas shigelloides NOT DETECTED NOT DETECTED Final   Salmonella species NOT DETECTED NOT DETECTED Final   Yersinia enterocolitica NOT DETECTED NOT DETECTED Final   Vibrio species NOT DETECTED NOT DETECTED Final   Vibrio cholerae NOT DETECTED NOT DETECTED Final   Enteroaggregative E coli (EAEC) NOT DETECTED NOT DETECTED Final   Enteropathogenic E coli (EPEC) NOT DETECTED NOT DETECTED Final   Enterotoxigenic E coli (ETEC) NOT DETECTED NOT DETECTED Final   Shiga like toxin producing E coli (STEC) NOT DETECTED NOT DETECTED Final   Shigella/Enteroinvasive E coli (EIEC) NOT DETECTED NOT DETECTED Final   Cryptosporidium NOT DETECTED NOT DETECTED Final   Cyclospora cayetanensis NOT DETECTED NOT DETECTED Final   Entamoeba histolytica NOT DETECTED NOT DETECTED Final   Giardia lamblia NOT DETECTED NOT DETECTED Final   Adenovirus F40/41 NOT DETECTED NOT DETECTED Final   Astrovirus NOT DETECTED NOT DETECTED Final   Norovirus GI/GII NOT DETECTED  NOT DETECTED Final   Rotavirus A NOT DETECTED NOT DETECTED Final   Sapovirus (I, II, IV, and V) NOT DETECTED NOT DETECTED Final    Comment: Performed at [facility], [place] [place], [place] [address]  C. Diff by PCR, Reflexed     Status: Abnormal   Collection Time: 12/07/20  4:23 PM  Result Value Ref Range Status   Toxigenic C. Difficile by PCR POSITIVE (A) NEGATIVE Final    Comment: Positive for toxigenic C. difficile with little to no toxin production. Only treat if clinical presentation suggests symptomatic illness. Performed at [facility], [place] [address], [place], [place] [address]   Resp Panel by RT-PCR (Flu A&B, Covid) Nasopharyngeal Swab     Status: None   Collection Time: 12/13/20 10:43 AM   Specimen: Nasopharyngeal Swab;  Nasopharyngeal(NP) swabs in vial transport medium  Result Value Ref Range Status   SARS Coronavirus 2 by RT PCR NEGATIVE NEGATIVE Final    Comment: (NOTE) SARS-CoV-2 target nucleic acids are NOT DETECTED.  The SARS-CoV-2 RNA is generally detectable in upper respiratory specimens during the acute phase of infection. The lowest concentration of SARS-CoV-2 viral copies this assay can detect is 138 copies/mL. A negative result does not preclude SARS-Cov-2 infection and should not be used as the sole basis for treatment or other patient management decisions. A negative result may occur with  improper specimen collection/handling, submission of specimen other than nasopharyngeal swab, presence of viral mutation(s) within the areas targeted by this assay, and inadequate number of viral copies(<138 copies/mL). A negative result must be combined with clinical observations, patient history, and epidemiological information. The expected result is Negative.  Fact Sheet for Patients:  EntrepreneurPulse.com.au  Fact Sheet for Healthcare Providers:  IncredibleEmployment.be  This test is no t yet approved or cleared by the [place] FDA and  has been authorized for detection and/or diagnosis of SARS-CoV-2 by FDA under an Emergency Use Authorization (EUA). This EUA will remain  in effect (meaning this test can be used) for the duration of the COVID-19 declaration under Section 564(b)(1) of the Act, 21 U.S.C.section 360bbb-3(b)(1), unless the authorization is terminated  or revoked sooner.       Influenza A by PCR NEGATIVE NEGATIVE Final   Influenza B by PCR NEGATIVE NEGATIVE Final    Comment: (NOTE) The Xpert Xpress SARS-CoV-2/FLU/RSV plus assay is intended as an aid in the diagnosis of influenza from Nasopharyngeal swab specimens and should not be used as a sole basis for treatment. Nasal washings and aspirates are unacceptable for Xpert Xpress  SARS-CoV-2/FLU/RSV testing.  Fact Sheet for Patients: EntrepreneurPulse.com.au  Fact Sheet for Healthcare Providers: IncredibleEmployment.be  This test is not yet approved or cleared by the [place] FDA and has been authorized for detection and/or diagnosis of SARS-CoV-2 by FDA under an Emergency Use Authorization (EUA). This EUA will remain in effect (meaning this test can be used) for the duration of the COVID-19 declaration under Section 564(b)(1) of the Act, 21 U.S.C. section 360bbb-3(b)(1), unless the authorization is terminated or revoked.  Performed at [facility], [place] [address], [place], [place] [address]      Time coordinating discharge: Over 30 minutes  SIGNED:   Phillips Climes, MD  [facility] 12/13/2020, 3:39 PM Pager   If 7PM-7AM, please contact night-coverage [email or web address] Password [email or web address]

## 2020-12-13 NOTE — Discharge Instructions (Signed)
Follow with Primary MD Wenda Low, MD in 7 days   Get CBC, CMP,  checked  by Primary MD next visit.    Activity: As tolerated with Full fall precautions use walker/cane & assistance as needed   Disposition SNF   Diet: DYS 1 with thin liquid. Advance as tolerated.   On your next visit with your primary care physician please Get Medicines reviewed and adjusted.   Please request your Prim.MD to go over all Hospital Tests and Procedure/Radiological results at the follow up, please get all Hospital records sent to your Prim MD by signing hospital release before you go home.   If you experience worsening of your admission symptoms, develop shortness of breath, life threatening emergency, suicidal or homicidal thoughts you must seek medical attention immediately by calling 911 or calling your MD immediately  if symptoms less severe.  You Must read complete instructions/literature along with all the possible adverse reactions/side effects for all the Medicines you take and that have been prescribed to you. Take any new Medicines after you have completely understood and accpet all the possible adverse reactions/side effects.   Do not drive, operating heavy machinery, perform activities at heights, swimming or participation in water activities or provide baby sitting services if your were admitted for syncope or siezures until you have seen by Primary MD or a Neurologist and advised to do so again.  Do not drive when taking Pain medications.    Do not take more than prescribed Pain, Sleep and Anxiety Medications  Special Instructions: If you have smoked or chewed Tobacco  in the last 2 yrs please stop smoking, stop any regular Alcohol  and or any Recreational drug use.  Wear Seat belts while driving.   Please note  You were cared for by a hospitalist during your hospital stay. If you have any questions about your discharge medications or the care you received while you were in the  hospital after you are discharged, you can call the unit and asked to speak with the hospitalist on call if the hospitalist that took care of you is not available. Once you are discharged, your primary care physician will handle any further medical issues. Please note that NO REFILLS for any discharge medications will be authorized once you are discharged, as it is imperative that you return to your primary care physician (or establish a relationship with a primary care physician if you do not have one) for your aftercare needs so that they can reassess your need for medications and monitor your lab values.

## 2020-12-13 NOTE — Plan of Care (Signed)

## 2020-12-13 NOTE — TOC Transition Note (Signed)
Transition of Care [facility]) - CM/SW Discharge Note   Patient Details  Name: Hayden Mckinney MRN: [id number] Date of Birth: 1945-01-25  Transition of Care [facility]) CM/SW Contact:  Benard Halsted, LCSW Phone Number: 12/13/2020, 1:39 PM   Clinical Narrative:    [facility] aware of patient discharge. CSW unable to locate patient's sister and facility does not have a different contact number for her. Patient returning to long term care.    Final next level of care: Skilled Nursing Facility Barriers to Discharge: Barriers Resolved   Patient Goals and CMS Choice Patient states their goals for this hospitalization and ongoing recovery are:: Return to SNF CMS Medicare.gov Compare Post Acute Care list provided to:: Patient Choice offered to / list presented to : Patient  Discharge Placement   Existing PASRR number confirmed : 12/13/20          Patient chooses bed at: [facility] Patient to be transferred to facility by: [place] Name of family member notified: unable to locate family Patient and family notified of of transfer: 12/13/20  Discharge Plan and Services                                     Social Determinants of Health (SDOH) Interventions     Readmission Risk Interventions No flowsheet data found.

## 2020-12-13 NOTE — TOC Transition Note (Signed)
Transition of Care [facility]) - CM/SW Discharge Note   Patient Details  Name: Hayden Mckinney MRN: [id number] Date of Birth: 15-Sep-1945  Transition of Care [facility]) CM/SW Contact:  Benard Halsted, LCSW Phone Number: 12/13/2020, 1:55 PM   Clinical Narrative:    Patient will DC to: [facility] Anticipated DC date: 12/13/20 Family notified: unable to locate family Transport by: Corey Harold   Per MD patient ready for DC to [facility]. RN to call report prior to discharge [phone]). RN, patient, and facility notified of DC. Discharge Summary and FL2 sent to facility. DC packet on chart. Ambulance transport requested for patient.   CSW will sign off for now as social work intervention is no longer needed. Please consult [place] again if new needs arise.     Final next level of care: Skilled Nursing Facility Barriers to Discharge: Barriers Resolved   Patient Goals and CMS Choice Patient states their goals for this hospitalization and ongoing recovery are:: Return to SNF CMS Medicare.gov Compare Post Acute Care list provided to:: Patient Choice offered to / list presented to : Patient  Discharge Placement   Existing PASRR number confirmed : 12/13/20          Patient chooses bed at: [facility] Patient to be transferred to facility by: [place] Name of family member notified: unable to locate family Patient and family notified of of transfer: 12/13/20  Discharge Plan and Services                                     Social Determinants of Health (SDOH) Interventions     Readmission Risk Interventions No flowsheet data found.

## 2021-01-10 ENCOUNTER — Ambulatory Visit (INDEPENDENT_AMBULATORY_CARE_PROVIDER_SITE_OTHER): Payer: Medicare Other | Admitting: Adult Health

## 2021-01-10 ENCOUNTER — Encounter: Payer: Self-pay | Admitting: Adult Health

## 2021-01-10 ENCOUNTER — Other Ambulatory Visit: Payer: Self-pay

## 2021-01-10 VITALS — BP 95/66 | HR 112

## 2021-01-10 DIAGNOSIS — Z09 Encounter for follow-up examination after completed treatment for conditions other than malignant neoplasm: Secondary | ICD-10-CM

## 2021-01-10 DIAGNOSIS — I639 Cerebral infarction, unspecified: Secondary | ICD-10-CM

## 2021-01-10 NOTE — Progress Notes (Signed)
[facility] [address] [place]. [place] [address] [phone]       HOSPITAL FOLLOW UP NOTE  [name] Date of Birth:  [birth year] Medical Record Number:  [id number]   Reason for Referral:  hospital stroke follow up    SUBJECTIVE:   CHIEF COMPLAINT:  Chief Complaint  Patient presents with   Follow-up    RM 2 with facility staff Suanne Marker     HPI:   Hayden Mckinney is a 76 y.o. male with PMHx of prior strokes in 2016, dementia, HTN, HLD, DM and intracranial stenosis who resides in a SNF and presented as a code stroke via EMS on 10/23/2020 when staff came in to help him get dressed and found him slumped over in wheelchair with slurred speech, aphasia and generalized weakness.  Personally reviewed hospitalization pertinent progress notes, lab work and imaging. Evaluated by Dr. Erlinda Hong for encephalopathy vs syncope vs seizure, less likely TIA, s/p TNK. MR brain negative for acute stroke.  CTA head/neck right ICA bulb 60 to 70% stenosis and moderate to severe right terminal ICA left M2 stenosis. EEG mild diffuse encephalopathy, no seizure.  LDL 107.  A1c 8.6.  Recommended DAPT for 3 weeks and Plavix alone and increase home dose atorvastatin from 40 mg to 80 mg daily. Prior stroke hx - 04/2014 R BG/CR infarcts and 06/2014 punctate right periventricular infarct s/p ILR without evidence of A fib.  Therapy evals recommended return back to SNF for continued PT/OT.   Returned to ED on 12/07/2020 for AMS and diarrhea due to C. Difficile. Repeat MR brain showed left pontine and left midbrain and thalamic acute infarcts.  MRA head/neck again showed proximal right ICA stenosis as well as intracranial arthrosclerosis.  EF 60 to 65%.  LDL 98.  A1c 7.8.  Etiology for stroke likely due to small vessel disease given location per Dr. Erlinda Hong. On DAPT from prior stroke therefore recommended aspirin and Brilinta for 30 days then Plavix alone and continuation of atorvastatin 80 mg daily.  C.  Diff treatment per primary team. Per note review, remains a full code with attempts for goal of care discussion but unable to reach family members to further discuss - recommended consult by palliative care outpatient to address goals of care.  PT/OT continued recommending SNF.   Today, 01/10/2021, patient being seen for initial hospital follow up accompanied by [facility] facility staff who is not well known to patient and not clinical. Patient poor historian and lethargic at visit greatly limiting history. Continued generalized muscle weakness and speech impairment (attempted to say name but unintelligible) - per facility staff, nonverbal since returned back to SNF. Would shake head yes/no at times but easily fall asleep. Per facility staff, he was previously ambulating but since return from recent admission on 12/6, sitting up in the w/c today has been the first time she has seen him out of bed. Per MAR review, on clopidogrel and atorvastatin.  Blood pressure today 95/66, HR 112.  Remains a full code. Lab work routinely completed. Unsure if on modified diet (was on Dys 1 reg liquids at d/c from recent admission).        PERTINENT IMAGING  Per hospitalization 12/07/2020 CT no acute abnormality, old right superior cerebellum, right BG, left thalamus infarct. Mr brain 51mm acute infarct medial left midbrain, acute infarct left pons MRA head/neck stable compared to prior imaging  EEG moderate diffuse encephalopathy, no seizures 2D echo EF 60 to 65%  Per hospitalization  10/23/2020 CTA head and neck right ICA bulb 60 to 70% stenosis, moderate to severe right terminal ICA and left M2. MRI no acute infarct 2D Echo not completed  EEG mild diffuse encephalopathy, no seizure LDL 107 HgbA1c 8.6    ROS:   N/A  PMH:  Past Medical History:  Diagnosis Date   CVA (cerebral vascular accident) ([place])    Dementia ([place])    Diabetic peripheral neuropathy ([place])    a. both feet   ETOH abuse    a.  04/2014 18-24 beers q weekend.   H/O echocardiogram    a. 04/2014 Echo: EF 55-60%, no rwma.   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 04/2014 multiple bateral infarcts, presumed to be embolic;  b. 07/9148 Carotid U/S: 1-39% bilat ICA stenosis.   TIA (transient ischemic attack)    a. 08/2010.   Type II diabetes mellitus ([facility])     PSH:  Past Surgical History:  Procedure Laterality Date   LOOP RECORDER IMPLANT N/A 04/20/2014   Procedure: LOOP RECORDER IMPLANT;  Surgeon: Thompson Grayer, MD;  Location: [facility];  Service: Cardiovascular;  Laterality: N/A;   repair of R hand after trauma     sarcoma removal     TEE WITHOUT CARDIOVERSION N/A 04/20/2014   Procedure: TRANSESOPHAGEAL ECHOCARDIOGRAM (TEE);  Surgeon: Larey Dresser, MD;  Location: [facility] ENDOSCOPY;  Service: Cardiovascular;  Laterality: N/A;    Social History:  Social History   Socioeconomic History   Marital status: Widowed    Spouse name: Not on file   Number of children: Not on file   Years of education: Not on file   Highest education level: Not on file  Occupational History   Occupation: Surveyor, quantity    Comment: retired  Tobacco Use   Smoking status: Never   Smokeless tobacco: Never  Substance and Sexual Activity   Alcohol use: Yes    Alcohol/week: 6.0 standard drinks    Types: 6 Standard drinks or equivalent per week    Comment: 18-24 beers every weekend.   Drug use: No   Sexual activity: Not on file  Other Topics Concern   Not on file  Social History Narrative   Lives with a room mate in [place].  Sedentary.  Drinks between 18 and 24 beers every weekend.   Social Determinants of Health   Financial Resource Strain: Not on file  Food Insecurity: Not on file  Transportation Needs: Not on file  Physical Activity: Not on file  Stress: Not on file  Social Connections: Not on file  Intimate Partner Violence: Not on file    Family History:  Family History   Problem Relation Age of Onset   Cancer Mother    Cancer Father    Diabetes Brother     Medications:   Current Outpatient Medications on File Prior to Visit  Medication Sig Dispense Refill   acetaminophen (TYLENOL) 325 MG tablet Take 2 tablets (650 mg total) by mouth every 6 (six) hours as needed for mild pain (or Fever >/= 101). 30 tablet 0   atorvastatin (LIPITOR) 80 MG tablet Take 1 tablet (80 mg total) by mouth daily at 6 PM. 30 tablet 0   bisacodyl (DULCOLAX) 10 MG suppository Place 1 suppository (10 mg total) rectally daily as needed for moderate constipation. 12 suppository 0   carvedilol (COREG) 6.25 MG tablet Take 1 tablet (6.25  mg total) by mouth 2 (two) times daily with a meal. 30 tablet 0   clopidogrel (PLAVIX) 75 MG tablet Take 1 tablet (75 mg total) by mouth daily. Start on 01/10/2021 after aspirin and Brilinta has been stopped day before.  0   docusate sodium (COLACE) 100 MG capsule Take 1 capsule (100 mg total) by mouth 2 (two) times daily. 10 capsule 0   escitalopram (LEXAPRO) 10 MG tablet Take 1 tablet (10 mg total) by mouth daily. 30 tablet 0   famotidine (PEPCID) 20 MG tablet Take 1 tablet (20 mg total) by mouth daily. 30 tablet 0   feeding supplement (ENSURE ENLIVE / ENSURE PLUS) LIQD Take 237 mLs by mouth 3 (three) times daily between meals. 237 mL 12   Ferrous Sulfate (IRON HIGH-POTENCY) 142 (45 Fe) MG TBCR Take 1 tablet by mouth daily. 30 tablet 0   gentamicin ointment (GARAMYCIN) 0.1 % Apply 1 application topically See admin instructions. Cleanse right shin wound with wound cleanser,apply ointment to wound,apply silver AG and cover with DPD x 2 weeks     glimepiride (AMARYL) 1 MG tablet Take 1 tablet (1 mg total) by mouth daily with breakfast. 30 tablet 0   hydrOXYzine (ATARAX) 25 MG tablet Take 25 mg by mouth at bedtime.     isosorbide-hydrALAZINE (BIDIL) 20-37.5 MG tablet Take 1 tablet by mouth 2 (two) times daily. 30 tablet 0   loperamide (IMODIUM) 2 MG capsule Take  1 capsule (2 mg total) by mouth as needed for diarrhea or loose stools. 30 capsule 0   Multiple Vitamin (MULTIVITAMIN WITH MINERALS) TABS tablet Take 1 tablet by mouth daily. 30 tablet 0   NON FORMULARY Take 120 mLs by mouth 3 (three) times daily.     NOVOLOG FLEXPEN 100 UNIT/ML FlexPen Inject 2-11 Units into the skin See admin instructions. If blood glucose is 101-150, take 2 units  151-200, take 3 units  201-250, take 5 units  251-300, take 7 units  301-350, take 9 units  350, take 11 units  Call MD if blood glucose is over 400 or under 60 15 mL 0   ondansetron (ZOFRAN) 4 MG tablet Take 4 mg by mouth every 8 (eight) hours as needed for nausea or vomiting.     pregabalin (LYRICA) 25 MG capsule Take 25 mg by mouth 2 (two) times daily.     tamsulosin (FLOMAX) 0.4 MG CAPS capsule Take 2 capsules (0.8 mg total) by mouth daily. 30 capsule 0   vitamin C (ASCORBIC ACID) 500 MG tablet Take 1 tablet (500 mg total) by mouth 2 (two) times daily. 30 tablet 0   Vitamin D, Ergocalciferol, (DRISDOL) 1.25 MG (50000 UNIT) CAPS capsule Take 1 capsule (50,000 Units total) by mouth every Tuesday. 5 capsule 0   No current facility-administered medications on file prior to visit.    Allergies:  No Known Allergies    OBJECTIVE:  Physical Exam  Vitals:   01/10/21 1516  BP: 95/66  Pulse: (!) 112   There is no height or weight on file to calculate BMI. No results found.  General: Frail malnourished elderly Caucasian male, seated, in no evident distress wearing 2L O2 via [place] Head: head normocephalic and atraumatic.   Neck: supple with no carotid or supraclavicular bruits Cardiovascular: regular rate and rhythm, no murmurs    Neurologic Exam Mental Status: lethargic. Severe dysarthria with unintelligible speech although limited speech output. Unable to evaluate cognition. Occasionally answers yes/no with head movement. Follows commands intermittently.  Cranial  Nerves: Fundoscopic exam UTA. Pupils UTA  to incorporation. Extraocular movements full without nystagmus. Visual fields blink to threat bilaterally. Hearing intact. Facial sensation intact. Face, tongue, palate moves normally and symmetrically.  Motor: Generalized weakness although difficulty fully assessing - appears strength LUE>RUE and equal BLE weakness Sensory.:  Response to painful stimuli Coordination: UTA Gait and Station: N/A -nonambulatory Reflexes: 1+ and symmetric. Toes downgoing.        ASSESSMENT: Hayden Mckinney is a 76 y.o. year old male with acute left medial midbrain and left pons infarcts on 12/07/2020 secondary to small vessel disease and admission for encephalopathy vs syncope vs seizure s/p TNK on 10/23/2020. Vascular risk factors include hx of prior strokes 2016, right ICA stenosis, intracranial stenosis, HTN, HLD, DM, advanced age, PVD and dementia.      PLAN:  Ischemic strokes (as above) :  very limited visit today as pt poor historian and lethargic. Minimally verbal, generalized weakness, and ? dysphagia.  Unsure if working with therapies or if any benefit working with therapies in his current condition.  Continue clopidogrel 75 mg daily  and atorvastatin 80 mg daily for secondary stroke prevention.   Discussed secondary stroke prevention measures and importance of close PCP follow up for aggressive stroke risk factor management. I have gone over the pathophysiology of stroke, warning signs and symptoms, risk factors and their management in some detail with instructions to go to the closest emergency room for symptoms of concern. HTN: BP goal <130/90.  On low side at visit on bidil and carvedilol per SNF - routine monitoring at facility. Recommend avoiding low blood pressure with known R ICA stenosis and intracranial stenosis to ensure adequate perfusion HLD: LDL goal <70. Recent LDL 98.  Continue atorvastatin 80 mg daily monitored by SNF DMII: A1c goal<7.0. Recent A1c 7.8. on insulin and glimepiride per  SNF Goals of care: highly encouraged this be addressed at SNF - attempted to address during recent hospitalization but unable to contact family. He remains a full code presently.     Follow up as needed as no benefit with routine follow up without family present OR without progress notes from SNF - stroke risk factors managed by SNF. No further recommendations at this time   CC:  [place] provider: Dr. Leonie Man PCP: Wenda Low, MD    I spent 38 minutes of face-to-face and non-face-to-face time with patient and facility SNF transporter.  This included previsit chart review including review of recent hospitalization x2, lab review, study review, electronic health record documentation, attempted physical exam, review of paperwork provided by SNF, further discussion with facility SNF transfer order and answered all other questions to satisfaction   Frann Rider, [facility]  [facility] [address] [place], [place] [address]  Phone [phone] Fax [phone] Note: This document was prepared with digital dictation and possible smart phrase technology. Any transcriptional errors that result from this process are unintentional.

## 2021-01-10 NOTE — Patient Instructions (Signed)
Limited visit as patient unable to provide history and no family present to assist - follows minimal commands intermittently and non verbal (did try to say name but no useful words).   Continue clopidogrel 75 mg daily  and atorvastatin 80mg  daily  for secondary stroke prevention managed by PCP/SNF  Continue to follow up with PCP/SNF regarding cholesterol, diabetes and blood pressure management  Maintain strict control of hypertension with blood pressure goal below 130/90, diabetes with hemoglobin A1c goal below 7.0 % and cholesterol with LDL cholesterol (bad cholesterol) goal below 70 mg/dL.   Signs of a Stroke? Follow the BEFAST method:  Balance Watch for a sudden loss of balance, trouble with coordination or vertigo Eyes Is there a sudden loss of vision in one or both eyes? Or double vision?  Face: Ask the person to smile. Does one side of the face droop or is it numb?  Arms: Ask the person to raise both arms. Does one arm drift downward? Is there weakness or numbness of a leg? Speech: Ask the person to repeat a simple phrase. Does the speech sound slurred/strange? Is the person confused ? Time: If you observe any of these signs, call 911.    Follow up only if family member able to accompany patient to appointment otherwise no purpose in routine follow ups    Thank you for coming to see [place] at [facility]. I hope we have been able to provide you high quality care today.  You may receive a patient satisfaction survey over the next few weeks. We would appreciate your feedback and comments so that we may continue to improve ourselves and the health of our patients.

## 2021-02-02 ENCOUNTER — Other Ambulatory Visit: Payer: Self-pay

## 2021-02-02 NOTE — Patient Outreach (Signed)
[facility]) Care Management  02/02/2021  Hayden Mckinney 1945/11/25 [id number]   First telephone outreach attempt to obtain mRS. Not available. Left message for returned call.  Philmore Pali [facility] Management Assistant [phone]

## 2021-02-03 ENCOUNTER — Other Ambulatory Visit: Payer: Self-pay

## 2021-02-03 NOTE — Patient Outreach (Signed)
[place]) Care Management  02/03/2021  Hayden Mckinney 1945-02-18 [id number]   Second telephone outreach attempt to obtain mRS. No answer. Unable to leave message for returned call.  Philmore Pali [facility] Management Assistant [phone]

## 2021-02-07 ENCOUNTER — Other Ambulatory Visit: Payer: Self-pay

## 2021-02-07 NOTE — Patient Outreach (Signed)
[facility]) Care Management  02/07/2021  Hayden Mckinney [birth year] [id number]   3 outreach attempts were completed to obtain mRs. mRs could not be obtained because patient never returned my calls. mRs=7    [place] Management Assistant [phone]
